# Patient Record
Sex: Female | Born: 1965
Health system: Southern US, Community
[De-identification: ages and names within clinical notes are randomized; demographics above are authoritative.]

## PROBLEM LIST (undated history)

## (undated) DIAGNOSIS — D649 Anemia, unspecified: Secondary | ICD-10-CM

## (undated) DIAGNOSIS — E785 Hyperlipidemia, unspecified: Secondary | ICD-10-CM

## (undated) DIAGNOSIS — I214 Non-ST elevation (NSTEMI) myocardial infarction: Secondary | ICD-10-CM

## (undated) DIAGNOSIS — G709 Myoneural disorder, unspecified: Secondary | ICD-10-CM

## (undated) DIAGNOSIS — K551 Chronic vascular disorders of intestine: Secondary | ICD-10-CM

## (undated) DIAGNOSIS — D869 Sarcoidosis, unspecified: Secondary | ICD-10-CM

## (undated) DIAGNOSIS — I10 Essential (primary) hypertension: Secondary | ICD-10-CM

## (undated) DIAGNOSIS — D219 Benign neoplasm of connective and other soft tissue, unspecified: Secondary | ICD-10-CM

## (undated) HISTORY — PX: ABDOMINAL HYSTERECTOMY: SHX81

## (undated) HISTORY — PX: FRACTURE SURGERY: SHX138

## (undated) HISTORY — DX: Anemia, unspecified: D64.9

---
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
## 1988-06-11 HISTORY — PX: TUBAL LIGATION: SHX77

## 1999-01-25 ENCOUNTER — Emergency Department (HOSPITAL_COMMUNITY): Admission: EM | Admit: 1999-01-25 | Discharge: 1999-01-25 | Payer: Self-pay | Admitting: Internal Medicine

## 1999-01-27 ENCOUNTER — Emergency Department (HOSPITAL_COMMUNITY): Admission: EM | Admit: 1999-01-27 | Discharge: 1999-01-27 | Payer: Self-pay | Admitting: Emergency Medicine

## 1999-04-20 ENCOUNTER — Other Ambulatory Visit: Admission: RE | Admit: 1999-04-20 | Discharge: 1999-04-20 | Payer: Self-pay | Admitting: Obstetrics and Gynecology

## 1999-07-12 ENCOUNTER — Encounter: Payer: Self-pay | Admitting: Emergency Medicine

## 1999-07-12 ENCOUNTER — Emergency Department (HOSPITAL_COMMUNITY): Admission: AD | Admit: 1999-07-12 | Discharge: 1999-07-12 | Payer: Self-pay | Admitting: Emergency Medicine

## 2001-03-03 ENCOUNTER — Emergency Department (HOSPITAL_COMMUNITY): Admission: EM | Admit: 2001-03-03 | Discharge: 2001-03-03 | Payer: Self-pay | Admitting: Emergency Medicine

## 2001-03-03 ENCOUNTER — Encounter: Payer: Self-pay | Admitting: Emergency Medicine

## 2002-05-23 ENCOUNTER — Emergency Department (HOSPITAL_COMMUNITY): Admission: EM | Admit: 2002-05-23 | Discharge: 2002-05-24 | Payer: Self-pay | Admitting: Emergency Medicine

## 2002-05-23 ENCOUNTER — Encounter: Payer: Self-pay | Admitting: Emergency Medicine

## 2002-09-24 ENCOUNTER — Other Ambulatory Visit: Admission: RE | Admit: 2002-09-24 | Discharge: 2002-09-24 | Payer: Self-pay | Admitting: Gynecology

## 2003-10-14 ENCOUNTER — Ambulatory Visit (HOSPITAL_COMMUNITY): Admission: RE | Admit: 2003-10-14 | Discharge: 2003-10-14 | Payer: Self-pay | Admitting: Gastroenterology

## 2003-11-09 ENCOUNTER — Other Ambulatory Visit: Admission: RE | Admit: 2003-11-09 | Discharge: 2003-11-09 | Payer: Self-pay | Admitting: Gynecology

## 2003-12-16 ENCOUNTER — Inpatient Hospital Stay (HOSPITAL_COMMUNITY): Admission: RE | Admit: 2003-12-16 | Discharge: 2003-12-18 | Payer: Self-pay | Admitting: Gynecology

## 2003-12-16 ENCOUNTER — Encounter (INDEPENDENT_AMBULATORY_CARE_PROVIDER_SITE_OTHER): Payer: Self-pay | Admitting: *Deleted

## 2003-12-26 ENCOUNTER — Inpatient Hospital Stay (HOSPITAL_COMMUNITY): Admission: AD | Admit: 2003-12-26 | Discharge: 2003-12-26 | Payer: Self-pay | Admitting: Gynecology

## 2005-04-27 ENCOUNTER — Emergency Department (HOSPITAL_COMMUNITY): Admission: EM | Admit: 2005-04-27 | Discharge: 2005-04-28 | Payer: Self-pay | Admitting: Emergency Medicine

## 2005-05-18 ENCOUNTER — Other Ambulatory Visit: Admission: RE | Admit: 2005-05-18 | Discharge: 2005-05-18 | Payer: Self-pay | Admitting: Obstetrics and Gynecology

## 2005-11-03 ENCOUNTER — Emergency Department (HOSPITAL_COMMUNITY): Admission: EM | Admit: 2005-11-03 | Discharge: 2005-11-03 | Payer: Self-pay | Admitting: Emergency Medicine

## 2006-03-28 ENCOUNTER — Encounter: Admission: RE | Admit: 2006-03-28 | Discharge: 2006-03-28 | Payer: Self-pay | Admitting: Gynecology

## 2006-10-31 ENCOUNTER — Encounter: Payer: Self-pay | Admitting: Gynecology

## 2006-10-31 ENCOUNTER — Ambulatory Visit (HOSPITAL_COMMUNITY): Admission: RE | Admit: 2006-10-31 | Discharge: 2006-10-31 | Payer: Self-pay | Admitting: Gynecology

## 2006-12-12 ENCOUNTER — Encounter: Admission: RE | Admit: 2006-12-12 | Discharge: 2006-12-12 | Payer: Self-pay | Admitting: Gynecology

## 2006-12-12 ENCOUNTER — Other Ambulatory Visit: Admission: RE | Admit: 2006-12-12 | Discharge: 2006-12-12 | Payer: Self-pay | Admitting: Gynecology

## 2007-06-26 ENCOUNTER — Emergency Department (HOSPITAL_COMMUNITY): Admission: EM | Admit: 2007-06-26 | Discharge: 2007-06-26 | Payer: Self-pay | Admitting: Emergency Medicine

## 2008-12-03 ENCOUNTER — Emergency Department (HOSPITAL_COMMUNITY): Admission: EM | Admit: 2008-12-03 | Discharge: 2008-12-03 | Payer: Self-pay | Admitting: Emergency Medicine

## 2008-12-05 ENCOUNTER — Emergency Department (HOSPITAL_COMMUNITY): Admission: EM | Admit: 2008-12-05 | Discharge: 2008-12-05 | Payer: Self-pay | Admitting: Emergency Medicine

## 2008-12-19 ENCOUNTER — Emergency Department (HOSPITAL_COMMUNITY): Admission: EM | Admit: 2008-12-19 | Discharge: 2008-12-19 | Payer: Self-pay | Admitting: Family Medicine

## 2009-01-08 ENCOUNTER — Emergency Department (HOSPITAL_COMMUNITY): Admission: EM | Admit: 2009-01-08 | Discharge: 2009-01-08 | Payer: Self-pay | Admitting: Emergency Medicine

## 2009-05-13 ENCOUNTER — Encounter: Admission: RE | Admit: 2009-05-13 | Discharge: 2009-05-13 | Payer: Self-pay | Admitting: Family Medicine

## 2009-07-04 ENCOUNTER — Ambulatory Visit (HOSPITAL_COMMUNITY): Admission: RE | Admit: 2009-07-04 | Discharge: 2009-07-04 | Payer: Self-pay | Admitting: Emergency Medicine

## 2009-11-22 ENCOUNTER — Ambulatory Visit (HOSPITAL_COMMUNITY): Admission: RE | Admit: 2009-11-22 | Discharge: 2009-11-22 | Payer: Self-pay | Admitting: Obstetrics & Gynecology

## 2010-07-02 ENCOUNTER — Encounter: Payer: Self-pay | Admitting: Family Medicine

## 2010-08-28 LAB — CBC
Hemoglobin: 14.2 g/dL (ref 12.0–15.0)
MCHC: 35.3 g/dL (ref 30.0–36.0)
RBC: 4.37 MIL/uL (ref 3.87–5.11)

## 2010-09-17 LAB — POCT URINALYSIS DIP (DEVICE)
Glucose, UA: NEGATIVE mg/dL
Hgb urine dipstick: NEGATIVE
Ketones, ur: NEGATIVE mg/dL
Nitrite: NEGATIVE
Specific Gravity, Urine: 1.01 (ref 1.005–1.030)
pH: 6.5 (ref 5.0–8.0)

## 2010-09-17 LAB — WET PREP, GENITAL: Yeast Wet Prep HPF POC: NONE SEEN

## 2010-09-17 LAB — GLUCOSE, CAPILLARY: Glucose-Capillary: 93 mg/dL (ref 70–99)

## 2010-10-24 NOTE — H&P (Signed)
NAME:  [name], [name]              ACCOUNT NO.:  [id number]   MEDICAL RECORD NO.:  [id number]          PATIENT TYPE:  AMB   LOCATION:  SDC                           FACILITY:  [facility]   PHYSICIAN:  Timothy P. Fontaine, M.D.DATE OF BIRTH:  04-20-1966   DATE OF ADMISSION:  DATE OF DISCHARGE:                              HISTORY & PHYSICAL   CHIEF COMPLAINT:  Right pelvic abdominal pain.   HISTORY OF PRESENT ILLNESS:  A 45 year old, G2, P2, female status post  hysterectomy with a 1 year history of recurrent right lower  abdominopelvic pain.  The patient has an acute onset and episode, was  evaluated in the office and was found to have right pelvic pain.  She  had no history to suggest infectious or appendicitis.  She had a  negative urine, a normal white count of 5.8, and a normal hemoglobin.  An ultrasound showed a cystic mass on the right ovary suggestive of a  corpus lutein measuring 23 mm.  The patient's pain has persisted since  onset, and she is admitted at this time for laparoscopic assessment and  right salpingo-oophorectomy.   PAST MEDICAL HISTORY:  Uncomplicated.   PAST SURGICAL HISTORY:  1. Total abdominal hysterectomy for leiomyomata.  2. Cesarean section.  3. Tubal sterilization.   ALLERGIES:  HYDROCODONE.   CURRENT MEDICATIONS:  Darvocet for pain p.r.n.Marland Kitchen   REVIEW OF SYSTEMS:  Noncontributory.   FAMILY HISTORY:  Noncontributory.   SOCIAL HISTORY:  Noncontributory.   ADMISSION PHYSICAL EXAMINATION:  VITAL SIGNS:  Afebrile, vital signs are  stable.  HEENT:  Normal.  LUNGS:  Clear.  CARDIAC:  Regular rate without rubs, murmurs, or gallops.  ABDOMEN:  Right lower quadrant tenderness to palpation.  No acute  changes such as rebound, guarding.  No masses or organomegaly.  PELVIC:  External, BUS, vagina normal.  Bimanual shows right-sided pain.  No palpable abnormalities.  Left side with masses or tenderness.  RECTOVAGINAL:  Normal.  Stool guaiac negative.   ASSESSMENT:  A 45 year old G2 P2 female, recurrent right lower quadrant  abdominopelvic pain over the past year, comes and goes suggestive of  ovarian origin.  Current episode disabling with evidence of physiologic  cystic changes on the right ovary.  Options for management were reviewed  to include expectant management, diagnostic laparoscopy up to and  including oophorectomy.  The patient, due to the chronicity of her  complaints wants to proceed with a right salpingo-oophorectomy  regardless of pathology encountered at the time of surgery, which I  thing given her total history is reasonable.  She does give me  permission to remove the left ovary if significant disease is  encountered, but she prefers to keep her left ovary.  She understands by  doing so she is at risk for left ovarian disease in the future such as  benign, cystic, or pain as well as ovarian cancer in the future, and she  understands and accepts this.  She understands that we will initiate  laparoscopic surgery.  She does have a history of prior abdominal  procedures, although at the time of her hysterectomy,  there was not  significant adhesions noted, but she does understand the increased risk  for injury with prior abdominal surgeries and will plan on a bowel prep  prophylactically.  She also understands that any time during the  surgery, I may convert from the laparoscopic approach to a laparotomy if  it is felt unsafe to proceed with the laparoscopic approach or if  complications arise, and she clearly understands this and accepts this.  The patient also understands there are no guarantees as far as pain  relief is concerned, that her pain may persist, worsen, or change  following the procedure and that it may not be ovarian in etiology,  although it certainly all points towards this.  She has no  gastrointestinal symptoms to accompany her pain nor does she have  urinary symptoms to suggest alternative etiologies.   I reviewed what is  involved with laparoscopic surgery to include multiple port sites,  trocar introduction, insufflation, sharp/blunt dissection, use of  electrocautery.  The risks of inadvertent injury to internal organs  including bowel, bladder, ureters, vessels, and nerves necessitating  major exploratory reparative surgery and future reparative surgeries  including bowel resection, ostomy formation, ureteral damage,  reimplantation all reviewed, understood, and accepted.  She understands  that this may be immediately recognized during this procedure or may be  delay recognized following the procedure.  The risks of infection, both  internal requiring prolonged antibiotics as well as incisional requiring  opening and draining of incisions was all discussed, understood, and  accepted.  The risks of hemorrhage necessitating transfusion and the  risks of transfusion including transfusion reaction, hepatitis, HIV, mad  cow disease, and other unknown entities was all discussed, understood,  and accepted.  The patient's questions were answered to her  satisfaction, and she is ready to proceed with surgery.      Timothy P. Fontaine, M.D.  Electronically Signed     TPF/MEDQ  D:  10/28/2006  T:  10/28/2006  Job:  [id number]

## 2010-10-24 NOTE — Op Note (Signed)
NAME:  [name], [name]              ACCOUNT NO.:  [id number]   MEDICAL RECORD NO.:  [id number]          PATIENT TYPE:  AMB   LOCATION:  SDC                           FACILITY:  [facility]   PHYSICIAN:  Timothy P. Fontaine, M.D.DATE OF BIRTH:  1966-03-19   DATE OF PROCEDURE:  10/31/2006  DATE OF DISCHARGE:                               OPERATIVE REPORT   PREOPERATIVE DIAGNOSIS:  Right abdominal pain.   POSTOPERATIVE DIAGNOSES:  1. Bilateral adnexal adhesions.  2. Bilateral small bowel to pelvic adhesions.  3. Perihepatic Fitz-Hugh-Curtis type adhesions   PROCEDURES:  1. Laparoscopic lysis of adhesions.  2. Right salpingo-oophorectomy.   SURGEON:  Timothy P. Fontaine, M.D.   ASSISTANTGaetano Hawthorne. Lily Peer, M.D.   ANESTHETIC:  General.   SPECIMEN:  Right ovary and right fallopian tube segment.   ESTIMATED BLOOD LOSS:  Minimal.   COMPLICATIONS:  None   FINDINGS:  EUA:  Bimanual exam without gross masses or abnormalities  noted.  Laparoscopic:  Small bowel loops adherent along both pelvic  sidewalls overlying adnexa sharply lysed and freed.  Left fallopian tube  and ovary grossly normal in appearance, adherent to the left sidewall.  Right fallopian tube and ovary grossly normal in appearance, firmly  adherent to the right pelvic sidewall.  No evidence of endometriosis or  other pelvic pathology.  Upper abdominal exam:  Appendix grossly normal,  free and mobile.  Liver adherent to the anterior chest wall and  perihepatic adhesions consistent with a Fitz-Hugh Curtis type syndrome.  Gallbladder grossly normal.  No evidence of upper abdominal pathology  other than the perihepatic adhesions.   PROCEDURE:  The patient was taken to the operating room, underwent  general anesthesia, placed in low dorsal lithotomy position, received an  abdominal perineal vaginal preparation with Betadine solution.  EUA  performed.  Foley catheter was placed in sterile technique and a sponge  stick placed  within the vagina for subsequent manipulation.  The patient  was draped in the usual fashion.  A transverse infraumbilical incision  was made.  The direct entry OptiVu type 10 mm laparoscope was then  placed under direct visualization without difficulty and the abdomen was  insufflated with carbon dioxide gas.  Right and left 5 mm suprapubic  ports were then placed under direct visualization after  transillumination for the vessels without difficulty.  Examination of  pelvic organs, upper abdominal exam was then carried out with findings  noted above.  Through sharp and blunt dissection, the small bowel loops  of intestine were freed from the bilateral adnexal regions to expose the  underlying adnexa.  The right adnexa was then elevated.  The right  ureter was identified and subsequently through progressive sharp and  blunt dissection, the ovary was progressively freed to allow access to  the right infundibulopelvic vessels.  Again the ureter was reinspected  and subsequently the infundibulopelvic pedicle was bipolar cauterized to  a flow of zero and incised.  Through sharp and blunt dissection and the  use of electrocautery, the ovary was freed from the sidewall.  There was  an area of ovarian  stroma and capsule that was left attached to the  peritoneum as the ureter passed underneath this area and it was feared  that attempted resection would jeopardize the ureter and this was left  in situ.  The stroma and capsule were superficially bipolar cauterized  to attempt to eradicate any residual ovarian active tissue.  The pelvis  was copiously irrigated.  Adequate hemostasis was visualized.  Subsequently the 5 mm laparoscope was placed through the suprapubic  port.  The Endopouch type bag was placed through the infraumbilical 10  mm port.  The ovary and tube placed within the bag and subsequently  removed through the infraumbilical port.  The pelvis was then  reinspected, adequate hemostasis  was visualized.  The ureter again was  identified on the right with normal-appearing peristalsis.  Interceed  was placed over the adnexal region.  The right and left 5 mm suprapubic  ports were removed.  The gas allowed to slowly escape and again under a  low pressure situation, the suprapubic ports and the pelvis showed  adequate hemostasis.  The infraumbilical port was then backed out under  direct visualization showing adequate hemostasis.  No evidence of hernia  formation.  A 0 Vicryl suture was placed infraumbilically in a running  fascial stitch to close the fascia.  All skin incisions injected using  0.25% Marcaine and the skin was reapproximated using 4-0 chromic in  interrupted cutaneous stitch.  Sterile dressings were applied.  The  Foley was removed.  The patient placed in the supine position, awakened  without difficulty, taken to the recovery room in good condition having  tolerated the procedure well.      Timothy P. Fontaine, M.D.  Electronically Signed     TPF/MEDQ  D:  10/31/2006  T:  10/31/2006  Job:  [id number]

## 2010-10-27 NOTE — H&P (Signed)
NAME:  [name]                          ACCOUNT NO.:  [id number]   MEDICAL RECORD NO.:  [id number]                   PATIENT TYPE:   LOCATION:                                       FACILITY:   PHYSICIAN:  Timothy P. Fontaine, M.D.           DATE OF BIRTH:   DATE OF ADMISSION:  DATE OF DISCHARGE:                                HISTORY & PHYSICAL   DATE OF ADMISSION:  December 16, 2003 for surgery.   CHIEF COMPLAINT:  Increasing dysmenorrhea, menorrhagia, pelvic pain.   HISTORY OF PRESENT ILLNESS:  A 45 year old G2 P2 female status post tubal  sterilization presents complaining of lower abdominal pain, menorrhagia,  dysmenorrhea.  She was initially evaluated through Urgent Care, had a CT  scan which documented a fibroid uterus, and was otherwise normal.  She was  referred to my office and on subsequent evaluation was indeed found to have  a fibroid uterus at approximately 12- to 14-week size.  The patient notes  over the past several years her periods have progressively gotten worse  where they are very heavy causing frequent tampon change and she is noted to  have an iron deficiency anemia with most recent hemoglobin at 9.9.  The  patient is admitted at this time for TAH.   PAST MEDICAL HISTORY:  Uncomplicated.   PAST SURGICAL HISTORY:  Includes C-section x1, vaginal delivery x1,  postpartum tubal sterilization.   CURRENT MEDICATIONS:  Iron, pain medication.   ALLERGIES:  None.   REVIEW OF SYSTEMS:  Noncontributory.   FAMILY HISTORY:  Noncontributory.   SOCIAL HISTORY:  Noncontributory.   ADMISSION PHYSICAL EXAMINATION:  VITAL SIGNS:  Afebrile; vital signs are  stable.  HEENT:  Normal.  LUNGS:  Clear.  CARDIAC:  Regular rate without rubs, murmurs, or gallops.  ABDOMEN:  Benign, noting palpable mass above the symphysis consistent with  leiomyomata.  PELVIC:  Externa, BUS, vagina normal.  Cervix grossly normal.  Uterus bulky,  12- to 14-week size.  Adnexa without  gross masses or tenderness.   ASSESSMENT AND PLAN:  A 45 year old gravida 2 para 2 female status post  tubal sterilization, increasing dysmenorrhea menorrhagia, lower pelvic pain,  dyspareunia, found to have leiomyomata on exam, confirmed through both CT as  well as pelvic ultrasound.  Right and left ovaries were visualized and were  normal.  Options for management were reviewed with the patient to include  conservative observation, myomectomy, arterial embolization, as well as  hysterectomy, and the patient wants to proceed with hysterectomy.  Given  prior cesarean section and bulk of the uterus, we feel an abdominal approach  is the most prudent and will plan on a total abdominal hysterectomy.  Ovarian conservation issue was discussed with the patient and the options of  removing both ovaries or keeping both ovaries were discussed with her and  she prefers to keep both ovaries for continued hormone production.  She  understands by keeping  her ovaries she is at risk for problems with her  ovaries in the future to include pain as well as disease requiring  reoperation as well as the risk of ovarian cancer.  I also reviewed with her  that if at the time of surgery one or both ovaries are severely affected  and/or for technical reasons will need to be removed with the uterus that  she does give me permission to remove one or both ovaries at the time of  surgery and would consider hormone replacement following surgery.  Absolute  sterility associated with hysterectomy was also discussed, understood, and  accepted.  Sexuality following hysterectomy was reviewed and the risk for  orgasmic dysfunction following the procedure as well as persistent  dyspareunia was discussed, understood, and accepted.  The expected  intraoperative/postoperative courses were reviewed with her and the risk of  infection - both internal requiring prolonged antibiotics as well as abscess  formation requiring  reoperation and drainage of abscesses was discussed with  her.  The risk of wound complications requiring opening and draining of  incisions, closure by secondary intention was all reviewed, understood, and  accepted.  The risk of hemorrhage necessitating transfusion and the risks of  transfusion were reviewed to include transfusion reaction, hepatitis, HIV,  mad cow disease, and other unknown entities.  She does have a low  hemoglobin.  She has been on extra iron and accepts the realistic risk of  transfusion.  The risk of inadvertant injury to internal organs including  bowel, bladder, ureters, vessels, and nerves necessitating major exploratory  reparative surgeries and future reparative surgeries including ostomy  formation was all discussed, understood, and accepted.  The patient's  questions are answered to her satisfaction and she is ready to proceed with  surgery.                                               Timothy P. Audie Box, M.D.    TPF/MEDQ  D:  12/03/2003  T:  12/04/2003  Job:  [id number]

## 2010-10-27 NOTE — Discharge Summary (Signed)
NAME:  [name], [name]                        ACCOUNT NO.:  [id number]   MEDICAL RECORD NO.:  [id number]                   PATIENT TYPE:  INP   LOCATION:  9312                                 FACILITY:  [facility]   PHYSICIAN:  Timothy P. Fontaine, M.D.           DATE OF BIRTH:  August 20, 1965   DATE OF ADMISSION:  12/16/2003  DATE OF DISCHARGE:  12/18/2003                                 DISCHARGE SUMMARY   DISCHARGE DIAGNOSES:  1. Leiomyomata.  2. Menorrhagia.  3. Dysmenorrhea.  4. Adenomyosis.   PROCEDURE:  Total abdominal hysterectomy, December 16, 2003.   PATHOLOGY:  [id number], chronic cervicitis, squamous metaplasia, early  secretory endometrium, multiple leiomyomata, adenomyosis.   HOSPITAL COURSE:  The patient was admitted for the above surgery which was  performed uncomplicated on December 16, 2003. The patient's postoperative course  was uncomplicated and she was discharged on postoperative day #2, ambulating  well, tolerating a regular diet with a postop hemoglobin of 8.5.  The  patient received precaution instructions in followup and will be seen in the  office in two weeks and received a prescription for Demerol 50 mg #10, 1-2  p.o. q.4-6 h. p.r.n. pain.                                               Timothy P. Audie Box, M.D.    TPF/MEDQ  D:  01/13/2004  T:  01/14/2004  Job:  [id number]

## 2010-10-27 NOTE — Op Note (Signed)
NAME:  [name], [name]                        ACCOUNT NO.:  [id number]   MEDICAL RECORD NO.:  [id number]                   PATIENT TYPE:  INP   LOCATION:  9399                                 FACILITY:  [facility]   PHYSICIAN:  Timothy P. Fontaine, M.D.           DATE OF BIRTH:  [birth year]   DATE OF PROCEDURE:  12/16/2003  DATE OF DISCHARGE:                                 OPERATIVE REPORT   PREOPERATIVE DIAGNOSIS:  Leiomyomata.   POSTOPERATIVE DIAGNOSIS:  Leiomyomata.   PROCEDURE:  Total abdominal hysterectomy.   SURGEON:  Timothy P. Fontaine, M.D.   ASSISTANT:  Rande Brunt. Eda Paschal, M.D.   ANESTHESIA:  General endotracheal anesthesia.   ESTIMATED BLOOD LOSS:  200 mL.   COMPLICATIONS:  None.   SPECIMENS:  Uterus.   FINDINGS:  Large lower uterine segment leiomyomata.  Pelvis otherwise  unremarkable.  Right and left fallopian tube segments grossly normal.  Evidence of prior tubal sterilization.  Right and left ovaries grossly  normal, free and mobile.   PROCEDURE:  The patient was taken to the operating room, placed in the  supine position and underwent general endotracheal anesthesia without  difficulty.  She received an abdominal, vaginal and perineal preparation  with Betadine solution per nursing personnel and a Foley catheter was placed  in sterile technique.  The patient was draped in usual fashion.  The abdomen  was sharply entered through a repeat Pfannenstiel incision achieving  adequate hemostasis at all levels.  A Balfour and bladder blade were placed  within the incision and the intestines were packed from the operative field.  Uterus was elevated from the pelvis.  The right round ligament was  transected with electrocautery.  The anterior vesicouterine peritoneal fold  was sharply dissected and the uterine ovarian pedicle was bluntly and  sharply developed.  The utero-ovarian pedicle was then doubly clamped, cut  and doubly ligated using 0 Vicryl suture and a suture  ligature followed by  simple stitch.  Similar procedure was carried out on the other side, noting  meeting in the middle of the anterior vesicouterine peritoneal fold  incision.  The right uterine vascular pedicle was sharply developed and  subsequently was doubly clamped, cut and doubly ligated using 0 Vicryl  suture.  A similar procedure was carried out on the other side noting the  leiomyomata was extending into the left broad ligament and the vessels were  attenuated over the leiomyomata.  The bladder flap was sharply and bluntly  developed without difficulty and subsequently the uterus was freed from its  attachment through progressive clamping, cutting and ligating of the  parametrial tissues and cardinal ligaments using 0 Vicryl suture.  The right  vaginal angle was then crossclamped with a right angle clamp and the vagina  was sharply entered and the specimen was subsequently circumferentially  excised from the patient.  Right and left angle sutures were placed using 0  Vicryl suture and were  tagged for further reference. The vaginal mucosa was  then run using 0 Vicryl suture in a running interlocking stitch.  The vagina  was then reapproximated with a single interrupted 0 Vicryl suture anterior  to posterior.  The pelvis was copiously irrigated.  All pedicles were  inspected showing adequate hemostasis.  The bowel packing was removed.  Balfour retractor and bladder blade removed and the anterior fascia was  reapproximated using 0 Vicryl suture in a running stitch starting at the  angle and meeting in the middle.  The subcutaneous tissues were irrigated.  Adequate hemostasis achieved with electrocautery.  The skin was  reapproximated using 4-0 Vicryl in a running subcuticular stitch.  Steri-  Strips and Benzoin applied.  Sterile dressing applied.  The patient was  awakened without difficulty and taken to the recovery room in good condition  having tolerated the procedure well with  free-flowing clear, yellow urine.                                               Timothy P. Audie Box, M.D.    TPF/MEDQ  D:  12/16/2003  T:  12/16/2003  Job:  [id number]

## 2011-03-02 LAB — DIFFERENTIAL
Basophils Absolute: 0
Eosinophils Relative: 4
Lymphocytes Relative: 34
Monocytes Relative: 6
Neutro Abs: 3.9
Neutrophils Relative %: 56

## 2011-03-02 LAB — I-STAT 8, (EC8 V) (CONVERTED LAB)
BUN: <3 — ABNORMAL LOW
Bicarbonate: 20.3
Bicarbonate: 20.9
Chloride: 106
Hemoglobin: 15.3 — ABNORMAL HIGH
Operator id: 133351
Potassium: 3.4 — ABNORMAL LOW
Potassium: 3.4 — ABNORMAL LOW
Sodium: 139
TCO2: 21
pH, Ven: 7.329 — ABNORMAL HIGH

## 2011-03-02 LAB — URINALYSIS, ROUTINE W REFLEX MICROSCOPIC
Ketones, ur: NEGATIVE
Specific Gravity, Urine: 1.007
Urobilinogen, UA: 1
pH: 6.5

## 2011-03-02 LAB — POCT I-STAT CREATININE
Creatinine, Ser: 0.7
Creatinine, Ser: 0.7

## 2011-03-02 LAB — PREGNANCY, URINE: Preg Test, Ur: NEGATIVE

## 2011-03-02 LAB — CBC
Hemoglobin: 14.2
MCHC: 33.8
MCV: 89.9
RBC: 4.68
RDW: 12.8

## 2012-06-20 ENCOUNTER — Emergency Department (HOSPITAL_COMMUNITY)
Admission: EM | Admit: 2012-06-20 | Discharge: 2012-06-20 | Disposition: A | Discharge status: Home / Self Care | Payer: BC Managed Care – PPO | Attending: Emergency Medicine | Admitting: Emergency Medicine

## 2012-06-20 ENCOUNTER — Encounter (HOSPITAL_COMMUNITY): Payer: Self-pay | Admitting: Emergency Medicine

## 2012-06-20 DIAGNOSIS — F17200 Nicotine dependence, unspecified, uncomplicated: Secondary | ICD-10-CM | POA: Insufficient documentation

## 2012-06-20 DIAGNOSIS — N390 Urinary tract infection, site not specified: Secondary | ICD-10-CM

## 2012-06-20 DIAGNOSIS — N760 Acute vaginitis: Secondary | ICD-10-CM | POA: Insufficient documentation

## 2012-06-20 DIAGNOSIS — Z3202 Encounter for pregnancy test, result negative: Secondary | ICD-10-CM | POA: Insufficient documentation

## 2012-06-20 DIAGNOSIS — R3911 Hesitancy of micturition: Secondary | ICD-10-CM | POA: Insufficient documentation

## 2012-06-20 DIAGNOSIS — R300 Dysuria: Secondary | ICD-10-CM | POA: Insufficient documentation

## 2012-06-20 DIAGNOSIS — Z79899 Other long term (current) drug therapy: Secondary | ICD-10-CM | POA: Insufficient documentation

## 2012-06-20 DIAGNOSIS — Z90710 Acquired absence of both cervix and uterus: Secondary | ICD-10-CM | POA: Insufficient documentation

## 2012-06-20 LAB — WET PREP, GENITAL: Yeast Wet Prep HPF POC: NONE SEEN

## 2012-06-20 LAB — URINALYSIS, ROUTINE W REFLEX MICROSCOPIC
Bilirubin Urine: NEGATIVE
Glucose, UA: NEGATIVE mg/dL
Hgb urine dipstick: NEGATIVE
Ketones, ur: NEGATIVE mg/dL
Nitrite: POSITIVE — AB
Protein, ur: NEGATIVE mg/dL
Urobilinogen, UA: 1 mg/dL (ref 0.0–1.0)
pH: 6.5 (ref 5.0–8.0)

## 2012-06-20 LAB — URINE MICROSCOPIC-ADD ON

## 2012-06-20 LAB — POCT PREGNANCY, URINE: Preg Test, Ur: NEGATIVE

## 2012-06-20 MED ORDER — METRONIDAZOLE 500 MG PO TABS
2000.0000 mg | ORAL_TABLET | Freq: Once | ORAL | Status: AC
  Administered 2012-06-20: 2000 mg via ORAL
  Filled 2012-06-20: qty 4

## 2012-06-20 MED ORDER — HYDROCODONE-ACETAMINOPHEN 5-325 MG PO TABS: 1.0000 | ORAL_TABLET | ORAL | Status: DC | PRN

## 2012-06-20 MED ORDER — LIDOCAINE HCL 1 % IJ SOLN
INTRAMUSCULAR | Status: AC
  Administered 2012-06-20: 0.9 mL
  Filled 2012-06-20: qty 20

## 2012-06-20 MED ORDER — ONDANSETRON 4 MG PO TBDP
4.0000 mg | ORAL_TABLET | Freq: Once | ORAL | Status: AC
  Administered 2012-06-20: 4 mg via ORAL
  Filled 2012-06-20: qty 1

## 2012-06-20 MED ORDER — CEFTRIAXONE SODIUM 250 MG IJ SOLR
250.0000 mg | Freq: Once | INTRAMUSCULAR | Status: AC
Start: 2012-06-20 — End: 2012-06-20
  Administered 2012-06-20: 250 mg via INTRAMUSCULAR
  Filled 2012-06-20: qty 250

## 2012-06-20 MED ORDER — NITROFURANTOIN MONOHYD MACRO 100 MG PO CAPS: 100.0000 mg | ORAL_CAPSULE | Freq: Two times a day (BID) | ORAL | Status: DC

## 2012-06-20 MED ORDER — AZITHROMYCIN 250 MG PO TABS
1000.0000 mg | ORAL_TABLET | Freq: Once | ORAL | Status: AC
  Administered 2012-06-20: 1000 mg via ORAL
  Filled 2012-06-20: qty 4

## 2012-06-20 NOTE — ED Notes (Signed)
Pt states she has been drinking a lot of sodas and she states she has urinary frequency, painful urination, and abd pain  Pt states she has been drinking water all day but everytime she tries to urinate only a little comes out

## 2012-06-20 NOTE — ED Notes (Signed)
Pelvic setup at bedside.

## 2012-06-20 NOTE — ED Notes (Signed)
Pt reports unprotected sex with sexual partner started 6 months ago.

## 2012-06-20 NOTE — ED Provider Notes (Signed)
Medical screening examination/treatment/procedure(s) were performed by non-physician practitioner and as supervising physician I was immediately available for consultation/collaboration.  Taffie Eckmann, MD 06/20/12 0551 

## 2012-06-20 NOTE — ED Provider Notes (Signed)
History     CSN: [id number]  Arrival date & time 06/20/12  0234   First MD Initiated Contact with Patient 06/20/12 0249      Chief Complaint  Patient presents with  . Dysuria  . Abdominal Pain    (Consider location/radiation/quality/duration/timing/severity/associated sxs/prior treatment) HPI  Pt presents to the ER for suprapubic pain, urinary hesitancy, dysuria and would like to "get a full work up  and get checked for all the std's and everything like that". She says that she drinks a lot of carbonated  beverages and today drank a lot of water but wasn't able to urinate the same amount as normal. She  denies vomiting, diarrhea, flank pain, RLQ pain, fevers, chills, nausea, weakness. nad vss   History reviewed. No pertinent past medical history.  Past Surgical History  Procedure Date  . Cesarean section   . Abdominal hysterectomy     Family History  Problem Relation Age of Onset  . Hypertension Other   . Diabetes Other     History  Substance Use Topics  . Smoking status: Current Every Day Smoker  . Smokeless tobacco: Not on file  . Alcohol Use: No    OB History    Grav Para Term Preterm Abortions TAB SAB Ect Mult Living                  Review of Systems  Review of Systems  Gen: no weight loss, fevers, chills, night sweats  Eyes: no discharge or drainage, no occular pain or visual changes  Nose: no epistaxis or rhinorrhea  Mouth: no dental pain, no sore throat  Neck: no neck pain  Lungs:No wheezing, coughing or hemoptysis CV: no chest pain, palpitations, dependent edema or orthopnea  Abd: suprapubic pain, nausea, vomiting  GU: + dysuria and urinary hesitency NO gross hematuria  MSK:  No abnormalities  Neuro: no headache, no focal neurologic deficits  Skin: no abnormalities Psyche: negative.   Allergies  Hydrocodone  Home Medications   Current Outpatient Rx  Name  Route  Sig  Dispense  Refill  . CHLORHEXIDINE GLUCONATE 0.12 % MT SOLN  Mouth/Throat   Use as directed 15 mLs in the mouth or throat 2 (two) times daily. For teeth extraction         . NAPROXEN SODIUM 220 MG PO TABS   Oral   Take 220 mg by mouth 2 (two) times daily with a meal.         . HYDROCODONE-ACETAMINOPHEN 5-325 MG PO TABS   Oral   Take 1 tablet by mouth every 4 (four) hours as needed for pain.   6 tablet   0   . NITROFURANTOIN MONOHYD MACRO 100 MG PO CAPS   Oral   Take 1 capsule (100 mg total) by mouth 2 (two) times daily.   10 capsule   0   . PRESCRIPTION MEDICATION      Unknown Antibiotic for dental surgery           BP 168/96  Pulse 86  Resp 18  SpO2 100%  Physical Exam  Nursing note and vitals reviewed. Constitutional: She appears well-developed and well-nourished. No distress.  HENT:  Head: Normocephalic and atraumatic.  Eyes: Pupils are equal, round, and reactive to light.  Neck: Normal range of motion. Neck supple.  Cardiovascular: Normal rate and regular rhythm.   Pulmonary/Chest: Effort normal.  Abdominal: Soft. There is tenderness in the suprapubic area.  Genitourinary: Uterus normal. Cervix exhibits discharge. Cervix exhibits  no motion tenderness and no friability. Vaginal discharge found.  Neurological: She is alert.  Skin: Skin is warm and dry.    ED Course  Procedures (including critical care time)  Labs Reviewed  URINALYSIS, ROUTINE W REFLEX MICROSCOPIC - Abnormal; Notable for the following:    APPearance CLOUDY (*)     Nitrite POSITIVE (*)     Leukocytes, UA MODERATE (*)     All other components within normal limits  WET PREP, GENITAL - Abnormal; Notable for the following:    Clue Cells Wet Prep HPF POC MODERATE (*)     WBC, Wet Prep HPF POC RARE (*)     All other components within normal limits  URINE MICROSCOPIC-ADD ON - Abnormal; Notable for the following:    Bacteria, UA MANY (*)     All other components within normal limits  POCT PREGNANCY, URINE  GC/CHLAMYDIA PROBE AMP  URINE CULTURE    No results found.   1. UTI (lower urinary tract infection)   2. Bacterial vaginosis       MDM  Urinalysis is positive for leukocytes, nitrites, WBC and many bacteria.   Pt has no symptoms of Pyelo. No fevers, nausea, vomiting or abdominal pains. She does have some mild suprapubic bone discomfort. Labs show BV and UTI. She has concern for STD therefore I will go ahead and treat that here as well. rx Macrobid for UTI, Urine culture sent out  Pt has been advised of the symptoms that warrant their return to the ED. Patient has voiced understanding and has agreed to follow-up with the PCP or specialist.      Dorthula Matas, PA 06/20/12 [phone]

## 2012-06-20 NOTE — ED Notes (Addendum)
Pt reports having one sexual partner that started two years ago. Pt reports never having any problems until now. Signs and symptoms is lower abdominal sharp pain that started two days prior. Denies n/v/d. Pt reports "pulling" and burning sensation when she urinates. Denies painful intercourse.

## 2012-06-21 LAB — GC/CHLAMYDIA PROBE AMP: GC Probe RNA: NEGATIVE

## 2012-06-22 LAB — URINE CULTURE: Colony Count: >=100000

## 2012-06-23 NOTE — ED Notes (Signed)
+  Urine. Patient treated with Macrobid. Sensitive to same. Per protocol MD. °

## 2013-01-04 ENCOUNTER — Emergency Department (HOSPITAL_COMMUNITY)
Admission: EM | Admit: 2013-01-04 | Discharge: 2013-01-04 | Disposition: A | Discharge status: Home / Self Care | Payer: BC Managed Care – PPO | Attending: Emergency Medicine | Admitting: Emergency Medicine

## 2013-01-04 ENCOUNTER — Encounter (HOSPITAL_COMMUNITY): Payer: Self-pay | Admitting: Emergency Medicine

## 2013-01-04 ENCOUNTER — Emergency Department (HOSPITAL_COMMUNITY): Payer: BC Managed Care – PPO

## 2013-01-04 DIAGNOSIS — F17200 Nicotine dependence, unspecified, uncomplicated: Secondary | ICD-10-CM | POA: Insufficient documentation

## 2013-01-04 DIAGNOSIS — IMO0002 Reserved for concepts with insufficient information to code with codable children: Secondary | ICD-10-CM | POA: Insufficient documentation

## 2013-01-04 DIAGNOSIS — S29011A Strain of muscle and tendon of front wall of thorax, initial encounter: Secondary | ICD-10-CM

## 2013-01-04 DIAGNOSIS — Y990 Civilian activity done for income or pay: Secondary | ICD-10-CM | POA: Insufficient documentation

## 2013-01-04 DIAGNOSIS — X500XXA Overexertion from strenuous movement or load, initial encounter: Secondary | ICD-10-CM | POA: Insufficient documentation

## 2013-01-04 DIAGNOSIS — Y929 Unspecified place or not applicable: Secondary | ICD-10-CM | POA: Insufficient documentation

## 2013-01-04 DIAGNOSIS — Z79899 Other long term (current) drug therapy: Secondary | ICD-10-CM | POA: Insufficient documentation

## 2013-01-04 DIAGNOSIS — Y9389 Activity, other specified: Secondary | ICD-10-CM | POA: Insufficient documentation

## 2013-01-04 LAB — BASIC METABOLIC PANEL
Calcium: 8.9 mg/dL (ref 8.4–10.5)
GFR calc Af Amer: >90 mL/min (ref >90)
GFR calc non Af Amer: >90 mL/min (ref >90)
Sodium: 134 mEq/L — ABNORMAL LOW (ref 135–145)

## 2013-01-04 LAB — POCT I-STAT TROPONIN I: Troponin i, poc: 0 ng/mL (ref 0.00–0.08)

## 2013-01-04 LAB — CBC
MCH: 31.4 pg (ref 26.0–34.0)
MCHC: 36.1 g/dL — ABNORMAL HIGH (ref 30.0–36.0)
Platelets: 301 10*3/uL (ref 150–400)
RBC: 4.08 MIL/uL (ref 3.87–5.11)
RDW: 12.6 % (ref 11.5–15.5)

## 2013-01-04 LAB — PRO B NATRIURETIC PEPTIDE: Pro B Natriuretic peptide (BNP): 109.8 pg/mL (ref 0–125)

## 2013-01-04 MED ORDER — DIAZEPAM 5 MG PO TABS
5.0000 mg | ORAL_TABLET | Freq: Once | ORAL | Status: AC
  Administered 2013-01-04: 5 mg via ORAL
  Filled 2013-01-04: qty 1

## 2013-01-04 MED ORDER — TRAMADOL HCL 50 MG PO TABS
50.0000 mg | ORAL_TABLET | Freq: Once | ORAL | Status: AC
  Administered 2013-01-04: 50 mg via ORAL
  Filled 2013-01-04: qty 1

## 2013-01-04 MED ORDER — IBUPROFEN 200 MG PO TABS: 400.0000 mg | ORAL_TABLET | Freq: Four times a day (QID) | ORAL | Status: DC | PRN

## 2013-01-04 MED ORDER — DIAZEPAM 5 MG PO TABS: 5.0000 mg | ORAL_TABLET | Freq: Three times a day (TID) | ORAL | Status: DC | PRN

## 2013-01-04 MED ORDER — TRAMADOL HCL 50 MG PO TABS: 50.0000 mg | ORAL_TABLET | Freq: Four times a day (QID) | ORAL | Status: DC | PRN

## 2013-01-04 NOTE — ED Notes (Signed)
PT. REPORTS RIGHT CHEST PAIN UNDER BREAST/RIBCAGE WORSE WITH DEEP INSPIRATION ONSET 4 DAYS AGO . DENIES INJURY , SLIGHT SOB , DENIES NAUSEA OR DIAPHORESIS.
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
## 2013-04-01 ENCOUNTER — Emergency Department (HOSPITAL_COMMUNITY)
Admission: EM | Admit: 2013-04-01 | Discharge: 2013-04-01 | Disposition: A | Discharge status: Home / Self Care | Payer: BC Managed Care – PPO

## 2013-04-01 ENCOUNTER — Encounter (HOSPITAL_COMMUNITY): Payer: Self-pay | Admitting: Emergency Medicine

## 2013-04-01 ENCOUNTER — Emergency Department (HOSPITAL_COMMUNITY)
Admission: EM | Admit: 2013-04-01 | Discharge: 2013-04-01 | Disposition: A | Discharge status: Home / Self Care | Payer: BC Managed Care – PPO | Attending: Emergency Medicine | Admitting: Emergency Medicine

## 2013-04-01 DIAGNOSIS — L42 Pityriasis rosea: Secondary | ICD-10-CM | POA: Insufficient documentation

## 2013-04-01 DIAGNOSIS — Z79899 Other long term (current) drug therapy: Secondary | ICD-10-CM | POA: Insufficient documentation

## 2013-04-01 DIAGNOSIS — F17200 Nicotine dependence, unspecified, uncomplicated: Secondary | ICD-10-CM | POA: Insufficient documentation

## 2013-04-01 MED ORDER — KETOCONAZOLE 1 % EX SHAM: 1.0000 "application " | MEDICATED_SHAMPOO | Freq: Every day | CUTANEOUS | Status: DC

## 2013-04-01 MED ORDER — DIPHENHYDRAMINE HCL 25 MG PO CAPS
50.0000 mg | ORAL_CAPSULE | Freq: Once | ORAL | Status: AC
  Administered 2013-04-01: 50 mg via ORAL
  Filled 2013-04-01: qty 2

## 2013-04-01 NOTE — ED Notes (Signed)
Pt reports she went to the beach and since staying in the hotel she has had a rash to her back. Pt reports using Gold Bond itch cream and Benadryl but the rash has spread over her back and to her chest.  Pt reports itching to her back.

## 2013-04-01 NOTE — ED Provider Notes (Signed)
Medical screening examination/treatment/procedure(s) were performed by non-physician practitioner and as supervising physician I was immediately available for consultation/collaboration.    Sunnie Nielsen, MD 04/01/13 [phone]

## 2013-04-01 NOTE — ED Provider Notes (Signed)
CSN: [id number]     Arrival date & time 04/01/13  0027 History   First MD Initiated Contact with Patient 04/01/13 0053     Chief Complaint  Patient presents with  . Rash   HPI  History provided by the patient and significant other. Patient is a 47 year old female with no significant PMH who presents with concerns for persistent pruritic rash to her back and chest. Rash first began several weeks ago showed after returning home from to the beach. Patient has been using Benadryl which does help with the itching symptoms. She has also been using body lotions to help with some dryness of the skin caused by the rash. She also used Gold Bond itch cream with only slight improvements. She reports rash is persistent and has not gone away or improve. She did stay in a hotel on her trip no one else who stayed with her had any similar symptoms. She denies any associated fever, chills or sweats. No respiratory difficulties, swelling of the mouth, lips or tongue. She denies having similar rash previously. No unusual foods, soaps, lotions, detergents or shampoos.   History reviewed. No pertinent past medical history. Past Surgical History  Procedure Laterality Date  . Cesarean section    . Abdominal hysterectomy     Family History  Problem Relation Age of Onset  . Hypertension Other   . Diabetes Other    History  Substance Use Topics  . Smoking status: Current Every Day Smoker  . Smokeless tobacco: Not on file  . Alcohol Use: No   OB History   Grav Para Term Preterm Abortions TAB SAB Ect Mult Living                 Review of Systems  Constitutional: Negative for fever, chills and diaphoresis.  Respiratory: Negative for shortness of breath.   Skin: Positive for rash.  All other systems reviewed and are negative.    Allergies  Hydrocodone  Home Medications   Current Outpatient Rx  Name  Route  Sig  Dispense  Refill  . ibuprofen (ADVIL,MOTRIN) 200 MG tablet   Oral   Take 2 tablets  (400 mg total) by mouth every 6 (six) hours as needed for pain.   30 tablet   0   . KETOCONAZOLE, TOPICAL, 1 % SHAM   Apply externally   Apply 1 application topically daily.   200 mL   0    BP 154/92  Pulse 99  Temp(Src) 98 F (36.7 C) (Oral)  Resp 18  Ht 5\' 4"  (1.626 m)  Wt 137 lb 9.6 oz (62.415 kg)  BMI 23.61 kg/m2  SpO2 99% Physical Exam  Nursing note and vitals reviewed. Constitutional: She is oriented to person, place, and time. She appears well-developed and well-nourished. No distress.  HENT:  Head: Normocephalic.  Mouth/Throat: Oropharynx is clear and moist.  Eyes: Conjunctivae are normal.  Neck: Normal range of motion. Neck supple.  Cardiovascular: Normal rate and regular rhythm.   Pulmonary/Chest: Effort normal and breath sounds normal. No stridor. No respiratory distress. She has no wheezes. She has no rales.  Abdominal: Soft.  Musculoskeletal: Normal range of motion. She exhibits no edema and no tenderness.  Neurological: She is alert and oriented to person, place, and time.  Skin: Skin is warm and dry. Rash noted.  Hyperpigmented macular rash with dryness diffusely over the back. Lesions generally range between 7-15 cm. No erythema. No indurations. No vesicles or papules  Psychiatric: She has a normal  mood and affect. Her behavior is normal.    ED Course  Procedures    MDM   1. Pityriasis rosea    Patient appears well no acute distress. She does not appear toxic. Rash appears consistent with possible pityriasis rosea. Rash is diffuse. Does not appear urticarial. At this time we'll recommend continued treatment with right lotions for dryness of the skin as well as Benadryl for itching. Dermatology referral provided.    Angus Seller, PA-C 04/01/13 [phone]

## 2013-06-03 ENCOUNTER — Emergency Department (HOSPITAL_COMMUNITY): Payer: BC Managed Care – PPO

## 2013-06-03 ENCOUNTER — Encounter (HOSPITAL_COMMUNITY): Payer: Self-pay | Admitting: Emergency Medicine

## 2013-06-03 ENCOUNTER — Emergency Department (HOSPITAL_COMMUNITY)
Admission: EM | Admit: 2013-06-03 | Discharge: 2013-06-03 | Disposition: A | Discharge status: Home / Self Care | Payer: BC Managed Care – PPO | Attending: Emergency Medicine | Admitting: Emergency Medicine

## 2013-06-03 DIAGNOSIS — Z79899 Other long term (current) drug therapy: Secondary | ICD-10-CM | POA: Insufficient documentation

## 2013-06-03 DIAGNOSIS — R509 Fever, unspecified: Secondary | ICD-10-CM | POA: Insufficient documentation

## 2013-06-03 DIAGNOSIS — F17200 Nicotine dependence, unspecified, uncomplicated: Secondary | ICD-10-CM | POA: Insufficient documentation

## 2013-06-03 DIAGNOSIS — B9789 Other viral agents as the cause of diseases classified elsewhere: Secondary | ICD-10-CM | POA: Insufficient documentation

## 2013-06-03 DIAGNOSIS — R5381 Other malaise: Secondary | ICD-10-CM | POA: Insufficient documentation

## 2013-06-03 DIAGNOSIS — IMO0001 Reserved for inherently not codable concepts without codable children: Secondary | ICD-10-CM | POA: Insufficient documentation

## 2013-06-03 DIAGNOSIS — R110 Nausea: Secondary | ICD-10-CM | POA: Insufficient documentation

## 2013-06-03 DIAGNOSIS — R51 Headache: Secondary | ICD-10-CM | POA: Insufficient documentation

## 2013-06-03 DIAGNOSIS — B349 Viral infection, unspecified: Secondary | ICD-10-CM

## 2013-06-03 DIAGNOSIS — R52 Pain, unspecified: Secondary | ICD-10-CM | POA: Insufficient documentation

## 2013-06-03 MED ORDER — IBUPROFEN 800 MG PO TABS
800.0000 mg | ORAL_TABLET | Freq: Once | ORAL | Status: AC
  Administered 2013-06-03: 800 mg via ORAL
  Filled 2013-06-03: qty 1

## 2013-06-03 MED ORDER — SODIUM CHLORIDE 0.9 % IV BOLUS (SEPSIS)
1000.0000 mL | Freq: Once | INTRAVENOUS | Status: AC
  Administered 2013-06-03: 1000 mL via INTRAVENOUS

## 2013-06-03 NOTE — ED Notes (Signed)
Pt reports headache,non productive cough, body aches, chills and feeling lightheaded x 2 days. Denies any n/v/d. Mask on pt at triage.

## 2013-06-03 NOTE — ED Provider Notes (Signed)
CSN: [id number]     Arrival date & time 06/03/13  1512 History   First MD Initiated Contact with Patient 06/03/13 1630     Chief Complaint  Patient presents with  . Influenza   (Consider location/radiation/quality/duration/timing/severity/associated sxs/prior Treatment) HPI Comments: Patient presents to the ED with a chief complaint of cough, headache, fever, and generalized body aches x 3 days.  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  She denies getting a flu shot this year.  She denies any associated n/v/d.  She states that she has tried taking mucinex and OTC cough and cold medicine with some relief.  Patient is a 47 y.o. female presenting with flu symptoms. The history is provided by the patient. No language interpreter was used.  Influenza Presenting symptoms: cough, fatigue, fever, headache, myalgias and nausea   Presenting symptoms: no diarrhea, no rhinorrhea, no shortness of breath, no sore throat and no vomiting     History reviewed. No pertinent past medical history. Past Surgical History  Procedure Laterality Date  . Cesarean section    . Abdominal hysterectomy     Family History  Problem Relation Age of Onset  . Hypertension Other   . Diabetes Other    History  Substance Use Topics  . Smoking status: Current Every Day Smoker  . Smokeless tobacco: Not on file  . Alcohol Use: No   OB History   Grav Para Term Preterm Abortions TAB SAB Ect Mult Living                 Review of Systems  Constitutional: Positive for fever and fatigue.  HENT: Negative for rhinorrhea and sore throat.   Respiratory: Positive for cough. Negative for shortness of breath.   Gastrointestinal: Positive for nausea. Negative for vomiting and diarrhea.  Musculoskeletal: Positive for myalgias.  Neurological: Positive for headaches.  All other systems reviewed and are negative.    Allergies  Hydrocodone  Home Medications   Current  Outpatient Rx  Name  Route  Sig  Dispense  Refill  . ibuprofen (ADVIL,MOTRIN) 200 MG tablet   Oral   Take 2 tablets (400 mg total) by mouth every 6 (six) hours as needed for pain.   30 tablet   0   . KETOCONAZOLE, TOPICAL, 1 % SHAM   Apply externally   Apply 1 application topically daily.   200 mL   0    BP 126/89  Pulse 85  Temp(Src) 97.6 F (36.4 C) (Oral)  SpO2 100% Physical Exam  Nursing note and vitals reviewed. Constitutional: She is oriented to person, place, and time. She appears well-developed and well-nourished.  HENT:  Head: Normocephalic and atraumatic.  Eyes: Conjunctivae and EOM are normal. Pupils are equal, round, and reactive to light.  Neck: Normal range of motion. Neck supple.  Cardiovascular: Normal rate and regular rhythm.  Exam reveals no gallop and no friction rub.   No murmur heard. Pulmonary/Chest: Effort normal and breath sounds normal. No respiratory distress. She has no wheezes. She has no rales. She exhibits no tenderness.  Abdominal: Soft. Bowel sounds are normal. She exhibits no distension and no mass. There is no tenderness. There is no rebound and no guarding.  Musculoskeletal: Normal range of motion. She exhibits no edema and no tenderness.  Neurological: She is alert and oriented to person, place, and time.  Skin: Skin is warm and dry.  Psychiatric: She has a normal mood  and affect. Her behavior is normal. Judgment and thought content normal.    ED Course  Procedures (including critical care time) Results for orders placed during the hospital encounter of 01/04/13  CBC      Result Value Range   WBC 11.8 (*) 4.0 - 10.5 K/uL   RBC 4.08  3.87 - 5.11 MIL/uL   Hemoglobin 12.8  12.0 - 15.0 g/dL   HCT 96.0 (*) 45.4 - 09.8 %   MCV 87.0  78.0 - 100.0 fL   MCH 31.4  26.0 - 34.0 pg   MCHC 36.1 (*) 30.0 - 36.0 g/dL   RDW 11.9  14.7 - 82.9 %   Platelets 301  150 - 400 K/uL  BASIC METABOLIC PANEL      Result Value Range   Sodium 134 (*) 135 -  145 mEq/L   Potassium 3.4 (*) 3.5 - 5.1 mEq/L   Chloride 103  96 - 112 mEq/L   CO2 24  19 - 32 mEq/L   Glucose, Bld 107 (*) 70 - 99 mg/dL   BUN 10  6 - 23 mg/dL   Creatinine, Ser 5.62  0.50 - 1.10 mg/dL   Calcium 8.9  8.4 - 13.0 mg/dL   GFR calc non Af Amer >90  >90 mL/min   GFR calc Af Amer >90  >90 mL/min  PRO B NATRIURETIC PEPTIDE      Result Value Range   Pro B Natriuretic peptide (BNP) 109.8  0 - 125 pg/mL  POCT I-STAT TROPONIN I      Result Value Range   Troponin i, poc 0.00  0.00 - 0.08 ng/mL   Comment 3            Dg Chest 2 View  06/03/2013   CLINICAL DATA:  Flu symptoms, smoker, cough  EXAM: CHEST  2 VIEW  COMPARISON:  01/04/2013  FINDINGS: The heart size and mediastinal contours are within normal limits. Both lungs are clear. The visualized skeletal structures are unremarkable.  IMPRESSION: No active cardiopulmonary disease.   Electronically Signed   By: Ruel Favors M.D.   On: 06/03/2013 17:50     EKG Interpretation   None       MDM   1. Viral syndrome     Patient with symptoms consistent with influenza.  Vitals are stable, low-grade fever.  No signs of dehydration, tolerating PO's.  Lungs are clear.  Discussed the cost versus benefit of Tamiflu treatment with the patient.  The patient understands that symptoms are greater than the recommended 24-48 hour window of treatment.  Patient will be discharged with instructions to orally hydrate, rest, and use over-the-counter medications such as anti-inflammatories ibuprofen and Aleve for muscle aches and Tylenol for fever.    Filed Vitals:   06/03/13 1815  BP: 144/88  Pulse: 82  Temp: 97.8 F (36.6 C)      Roxy Horseman, PA-C 06/03/13 1835  Roxy Horseman, PA-C 06/03/13 [phone]

## 2013-06-03 NOTE — ED Provider Notes (Deleted)
Medical screening examination/treatment/procedure(s) were conducted as a shared visit with non-physician practitioner(s) and myself.  I personally evaluated the patient during the encounter.  EKG Interpretation   None         Shelda Jakes, MD 06/03/13 [phone]

## 2013-06-03 NOTE — ED Notes (Signed)
Pt comfortable with d/c and f/u instructions. No prescriptions 

## 2013-06-22 NOTE — ED Notes (Signed)
Medical screening examination/treatment/procedure(s) were performed by non-physician practitioner and as supervising physician I was immediately available for consultation/collaboration.  EKG Interpretation   None         Mervin Kung, MD 06/22/13 1215

## 2013-06-24 ENCOUNTER — Other Ambulatory Visit: Payer: Self-pay

## 2013-06-24 ENCOUNTER — Emergency Department (HOSPITAL_COMMUNITY)
Admission: EM | Admit: 2013-06-24 | Discharge: 2013-06-24 | Disposition: A | Discharge status: Home / Self Care | Payer: BC Managed Care – PPO | Attending: Emergency Medicine | Admitting: Emergency Medicine

## 2013-06-24 ENCOUNTER — Encounter (HOSPITAL_COMMUNITY): Payer: Self-pay | Admitting: Emergency Medicine

## 2013-06-24 ENCOUNTER — Emergency Department (HOSPITAL_COMMUNITY): Payer: BC Managed Care – PPO

## 2013-06-24 DIAGNOSIS — J180 Bronchopneumonia, unspecified organism: Secondary | ICD-10-CM | POA: Insufficient documentation

## 2013-06-24 DIAGNOSIS — M545 Low back pain, unspecified: Secondary | ICD-10-CM | POA: Insufficient documentation

## 2013-06-24 DIAGNOSIS — R079 Chest pain, unspecified: Secondary | ICD-10-CM | POA: Insufficient documentation

## 2013-06-24 DIAGNOSIS — F17200 Nicotine dependence, unspecified, uncomplicated: Secondary | ICD-10-CM | POA: Insufficient documentation

## 2013-06-24 DIAGNOSIS — J029 Acute pharyngitis, unspecified: Secondary | ICD-10-CM | POA: Insufficient documentation

## 2013-06-24 LAB — POCT I-STAT, CHEM 8
BUN: 4 mg/dL — ABNORMAL LOW (ref 6–23)
Calcium, Ion: 1.24 mmol/L — ABNORMAL HIGH (ref 1.12–1.23)
Chloride: 105 mEq/L (ref 96–112)
Creatinine, Ser: 0.8 mg/dL (ref 0.50–1.10)
GLUCOSE: 95 mg/dL (ref 70–99)
HCT: 41 % (ref 36.0–46.0)
HEMOGLOBIN: 13.9 g/dL (ref 12.0–15.0)
POTASSIUM: 3.9 meq/L (ref 3.7–5.3)
Sodium: 139 mEq/L (ref 137–147)
TCO2: 24 mmol/L (ref 0–100)

## 2013-06-24 MED ORDER — LEVOFLOXACIN 750 MG PO TABS
750.0000 mg | ORAL_TABLET | Freq: Every day | ORAL | Status: DC
Start: 2013-06-24 — End: 2013-06-24
  Administered 2013-06-24: 750 mg via ORAL
  Filled 2013-06-24: qty 1

## 2013-06-24 MED ORDER — BENZONATATE 100 MG PO CAPS: 100.0000 mg | ORAL_CAPSULE | Freq: Three times a day (TID) | ORAL | Status: DC

## 2013-06-24 MED ORDER — SODIUM CHLORIDE 0.9 % IV BOLUS (SEPSIS)
1000.0000 mL | Freq: Once | INTRAVENOUS | Status: AC
  Administered 2013-06-24: 1000 mL via INTRAVENOUS

## 2013-06-24 MED ORDER — BENZONATATE 100 MG PO CAPS
100.0000 mg | ORAL_CAPSULE | Freq: Once | ORAL | Status: AC
  Administered 2013-06-24: 100 mg via ORAL
  Filled 2013-06-24: qty 1

## 2013-06-24 MED ORDER — HYDROCODONE-HOMATROPINE 5-1.5 MG/5ML PO SYRP: 5.0000 mL | ORAL_SOLUTION | Freq: Once | ORAL | Status: DC

## 2013-06-24 MED ORDER — LEVOFLOXACIN 750 MG PO TABS: 750.0000 mg | ORAL_TABLET | Freq: Every day | ORAL | Status: DC

## 2013-06-24 NOTE — ED Provider Notes (Signed)
CSN: [id number]     Arrival date & time 06/24/13  6967 History   First MD Initiated Contact with Patient 06/24/13 0654     No chief complaint on file.  (Consider location/radiation/quality/duration/timing/severity/associated sxs/prior Treatment) HPI  48 year old female presents for evaluation of generalized body aches and productive cough. Patient reports she was seen in the ED 3 weeks ago for similar symptoms and was diagnosed with having influenza. She was discharge with supportive care however her symptoms continued to persist. States she continues to have nose, sneezing, sore throat, body aches, low back pain, cough productive with green sputum, pleuritic chest pain with cough. She has been taking over-the-counter medication including Tylenol, ibuprofen, TheraFlu with some relief. Patient states she took 2 weeks out of work and a return back to work for the past week. 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 States the symptom is now worsening. She denies any recent travel. She denies severe headache, neck stiffness, hemoptysis, nausea vomiting diarrhea, or rash. She has no chronic medical problem. She has no risk factors for PE or DVT  No past medical history on file. Past Surgical History  Procedure Laterality Date  . Cesarean section    . Abdominal hysterectomy     Family History  Problem Relation Age of Onset  . Hypertension Other   . Diabetes Other    History  Substance Use Topics  . Smoking status: Current Every Day Smoker  . Smokeless tobacco: Not on file  . Alcohol Use: No   OB History   Grav Para Term Preterm Abortions TAB SAB Ect Mult Living                 Review of Systems  All other systems reviewed and are negative.    Allergies  Hydrocodone  Home Medications   Current Outpatient Rx  Name  Route  Sig  Dispense  Refill  . dextromethorphan-guaiFENesin (MUCINEX DM) 30-600 MG per 12 hr tablet   Oral   Take 2 tablets by mouth  every 4 (four) hours as needed for cough.         Marland Kitchen ibuprofen (ADVIL,MOTRIN) 200 MG tablet   Oral   Take 800 mg by mouth every 6 (six) hours as needed for mild pain.          There were no vitals taken for this visit. Physical Exam  Nursing note and vitals reviewed. Constitutional: She is oriented to person, place, and time. She appears well-developed and well-nourished. No distress.  HENT:  Head: Atraumatic.  Right Ear: External ear normal.  Left Ear: External ear normal.  Nose: Nose normal.  Mouth/Throat: Oropharynx is clear and moist.  Eyes: Conjunctivae are normal.  Neck: Normal range of motion. Neck supple.  No nuchal rigidity  Cardiovascular: Normal rate and regular rhythm.   Pulmonary/Chest: Effort normal and breath sounds normal. She has no wheezes. She has no rales.  Abdominal: Soft. There is no tenderness.  Musculoskeletal: She exhibits no edema.  Lymphadenopathy:    She has no cervical adenopathy.  Neurological: She is alert and oriented to person, place, and time.  Skin: No rash noted.  Psychiatric: She has a normal mood and affect.    ED Course  Procedures (including critical care time)   Date: 06/24/2013  Rate: 77  Rhythm: normal sinus rhythm  QRS Axis: normal  Intervals: normal  ST/T Wave abnormalities: normal  Conduction Disutrbances:none  Narrative Interpretation:   Old EKG  Reviewed: unchanged    7:18 AM Patient in with flulike symptoms. No hypoxia to suggest pneumonia. She is afebrile with stable normal vital sign. Due to chronicity of her symptoms, will check electrolytes, chest x-ray and we'll give symptomatic treatment. She is PERC negative, and TIMI 0. Care discussed with attending.  8:37 AM Chest CT with evidence to suggest bronchopneumonia.  Will treat with levaquin.  Pt does have hx of smoking, and since pulmonary nodule cannot be excluded i recommend f/u chest CT in 3-6 months.  Smoking cessation discussed.  Pt agrees with plan.   Resources provided.    Labs Review Labs Reviewed  POCT I-STAT, CHEM 8 - Abnormal; Notable for the following:    BUN 4 (*)    Calcium, Ion 1.24 (*)    All other components within normal limits   Imaging Review Dg Chest 2 View  06/24/2013   CLINICAL DATA:  Shortness of breath and cough.  EXAM: CHEST  2 VIEW  COMPARISON:  DG CHEST 2 VIEW dated 06/03/2013; DG CHEST 2 VIEW dated 01/04/2013  FINDINGS: Trachea is midline. Heart size normal. There is a faint area of added density in the left midlung zone, questionably present on the prior examination. Lungs are otherwise grossly clear. No pleural fluid.  IMPRESSION: Possible added density in the left midlung zone, which can be seen with bronchopneumonia. Pulmonary nodule cannot be excluded. Given the patient's persistent cough and shortness of breath, considered CT chest without contrast in further evaluation, as clinically indicated. These results were called by telephone at the time of interpretation on 06/24/2013 at 7:44 AM to Nicolette Bang, R.N., who verbally acknowledged these results.   Electronically Signed   By: Lorin Picket M.D.   On: 06/24/2013 07:45   Ct Chest Wo Contrast  06/24/2013   CLINICAL DATA:  48 year old female with pain, body ache. Shortness of breath and cough. Initial encounter.  EXAM: CT CHEST WITHOUT CONTRAST  TECHNIQUE: Multidetector CT imaging of the chest was performed following the standard protocol without IV contrast.  COMPARISON:  Chest radiographs from the same day reported separately. CT Abdomen and Pelvis 07/04/2009.  FINDINGS: Major airways are patent. There is some paraseptal emphysema in the lung apices.  There are small number of scattered bilaterally, mostly peribronchovascular, irregular opacities. The most pronounced of these are at the mid lung level bilaterally, series 3 image 22 on the left (corresponding to the finding on the earlier radiograph), and more peripherally on image 20 in the right lung. These have  irregular margins. There is also a small are more round 7-8 mm pulmonary nodule in the left upper lobe on image 15. There is mild peribronchial thickening and bronchiectasis in the right apex on image 12. There is a 14 mm area of peribronchial opacity in the right lower lobe with mild bronchiectasis on image 36. There is a smaller irregular area of peribronchovascular opacity in the medial basal segment of the right lower lobe on image 44.  No pericardial or pleural effusion. No mediastinal lymphadenopathy. Negative non contrast thoracic inlet. No axillary lymphadenopathy.  In the right upper abdomen adjacent to the diaphragm there is an oval 19 x 13 mm lesion which is partially densely calcified. This was present in 2011 but less conspicuous and more so SI all at that time (measuring up to 7 mm in thickness). The adjacent liver parenchyma appears within normal limits. No superior diaphragmatic or upper abdominal lymphadenopathy.  Chronic right midpole nephrolithiasis. Punctate left renal midpole calculus. No  evidence of renal obstruction. Other visualized upper abdominal viscera and bowel are within normal limits.  No acute or suspicious osseous lesion.  IMPRESSION: 1. Multiple small scattered bilateral areas of irregular peribronchovascular opacity, most likely infectious. However, there are Mild underlying emphysematous and bronchiectatic changes, and at least 1 of the areas identified today appears nodular. Therefore follow-up chest CT in 3-6 months is recommended. This recommendation follows the consensus statement: Guidelines for Management of Small Pulmonary Nodules Detected on CT Scans: A Statement from the [employer] as published in Radiology 2005; 237:395-400. 2. No other acute process in the chest. 3. Small chronic lesion situated adjacent to the diaphragm and liver dome with dystrophic calcifications. This is only mildly increased in size since 2011. Favor postinflammatory or posttraumatic  sequelae. Attention can be directed on followup. 4. Nephrolithiasis.   Electronically Signed   By: Lars Pinks M.D.   On: 06/24/2013 08:25    EKG Interpretation   None       MDM   1. Bronchopneumonia    BP 145/79  Pulse 78  Temp(Src) 97.7 F (36.5 C) (Oral)  Resp 13  SpO2 100%  I have reviewed nursing notes and vital signs. I personally reviewed the imaging tests through PACS system  I reviewed available ER/hospitalization records thought the EMR     Domenic Moras, [place] 06/24/13 0845

## 2013-06-24 NOTE — ED Notes (Signed)
Patient transported to CT 

## 2013-06-24 NOTE — Discharge Instructions (Signed)
Please take antibiotic for the full duration.  Stop smoking and have a repeat chest CT scan in 3-6 months to ensure no worsening finding concerning for lung cancer.  Uses resources below to find primary care provider.  Pneumonia, Adult Pneumonia is an infection of the lungs.  CAUSES Pneumonia may be caused by bacteria or a virus. Usually, these infections are caused by breathing infectious particles into the lungs (respiratory tract). SYMPTOMS   Cough.  Fever.  Chest pain.  Increased rate of breathing.  Wheezing.  Mucus production. DIAGNOSIS  If you have the common symptoms of pneumonia, your caregiver will typically confirm the diagnosis with a chest X-ray. The X-ray will show an abnormality in the lung (pulmonary infiltrate) if you have pneumonia. Other tests of your blood, urine, or sputum may be done to find the specific cause of your pneumonia. Your caregiver may also do tests (blood gases or pulse oximetry) to see how well your lungs are working. TREATMENT  Some forms of pneumonia may be spread to other people when you cough or sneeze. You may be asked to wear a mask before and during your exam. Pneumonia that is caused by bacteria is treated with antibiotic medicine. Pneumonia that is caused by the influenza virus may be treated with an antiviral medicine. Most other viral infections must run their course. These infections will not respond to antibiotics.  PREVENTION A pneumococcal shot (vaccine) is available to prevent a common bacterial cause of pneumonia. This is usually suggested for:  People over 67 years old.  Patients on chemotherapy.  People with chronic lung problems, such as bronchitis or emphysema.  People with immune system problems. If you are over 65 or have a high risk condition, you may receive the pneumococcal vaccine if you have not received it before. In some countries, a routine influenza vaccine is also recommended. This vaccine can help prevent some cases  of pneumonia.You may be offered the influenza vaccine as part of your care. If you smoke, it is time to quit. You may receive instructions on how to stop smoking. Your caregiver can provide medicines and counseling to help you quit. HOME CARE INSTRUCTIONS   Cough suppressants may be used if you are losing too much rest. However, coughing protects you by clearing your lungs. You should avoid using cough suppressants if you can.  Your caregiver may have prescribed medicine if he or she thinks your pneumonia is caused by a bacteria or influenza. Finish your medicine even if you start to feel better.  Your caregiver may also prescribe an expectorant. This loosens the mucus to be coughed up.  Only take over-the-counter or prescription medicines for pain, discomfort, or fever as directed by your caregiver.  Do not smoke. Smoking is a common cause of bronchitis and can contribute to pneumonia. If you are a smoker and continue to smoke, your cough may last several weeks after your pneumonia has cleared.  A cold steam vaporizer or humidifier in your room or home may help loosen mucus.  Coughing is often worse at night. Sleeping in a semi-upright position in a recliner or using a couple pillows under your head will help with this.  Get rest as you feel it is needed. Your body will usually let you know when you need to rest. SEEK IMMEDIATE MEDICAL CARE IF:   Your illness becomes worse. This is especially true if you are elderly or weakened from any other disease.  You cannot control your cough with suppressants and  are losing sleep.  You begin coughing up blood.  You develop pain which is getting worse or is uncontrolled with medicines.  You have a fever.  Any of the symptoms which initially brought you in for treatment are getting worse rather than better.  You develop shortness of breath or chest pain. MAKE SURE YOU:   Understand these instructions.  Will watch your condition.  Will  get help right away if you are not doing well or get worse. Document Released: 05/28/2005 Document Revised: 08/20/2011 Document Reviewed: 08/17/2010 [facility] Patient Information 2014 [place], [place].   Emergency Department Resource Guide 1) Find a Doctor and Pay Out of Pocket Although you won't have to find out who is covered by your insurance plan, it is a good idea to ask around and get recommendations. You will then need to call the office and see if the doctor you have chosen will accept you as a new patient and what types of options they offer for patients who are self-pay. Some doctors offer discounts or will set up payment plans for their patients who do not have insurance, but you will need to ask so you aren't surprised when you get to your appointment.  2) Contact Your Local Health Department Not all health departments have doctors that can see patients for sick visits, but many do, so it is worth a call to see if yours does. If you don't know where your local health department is, you can check in your phone book. The CDC also has a tool to help you locate your state's health department, and many state websites also have listings of all of their local health departments.  3) Find a Walk-in Clinic If your illness is not likely to be very severe or complicated, you may want to try a walk in clinic. These are popping up all over the country in pharmacies, drugstores, and shopping centers. They're usually staffed by nurse practitioners or physician assistants that have been trained to treat common illnesses and complaints. They're usually fairly quick and inexpensive. However, if you have serious medical issues or chronic medical problems, these are probably not your best option.  No Primary Care Doctor: - Call Health Connect at  [phone] - they can help you locate a primary care doctor that  accepts your insurance, provides certain services, etc. - Physician Referral Service-  [phone]  Chronic Pain Problems: Organization         Address  Phone   Notes  [facility]  [phone] Patients need to be referred by their primary care doctor.   Medication Assistance: Organization         Address  Phone   Notes  [facility] Med[facility] [address] [place], [place] [address] [phone] --Must be a resident of [facility] -- Must have NO insurance coverage whatsoever (no Medicaid/ Medicare, etc.) -- The pt. MUST have a primary care doctor that directs their care regularly and follows them in the community   [facility]ist  [phone]   [employer]  [phone]    Agencies that provide inexpensive medical care: Organization         Address  Phone   Notes  [facility]  [phone]   [facility]    [phone]   [facility] [address] [place], [place] [address] [phone]   [facility] [address] [place]  [place] [phone]) [phone]   Planned Parenthood    [phone]   [facility]    [phone]   [facility]  [address], [place] Phone:  [phone](336) [phone], Fax:  [phone] Hours of Operation:  9 am - 6 pm, M-F.  Also accepts Medicaid/Medicare and self-pay.  [facility]  [address], [place] Phone: [phone](336) [phone], Fax: [phone]) [phone]. Hours of Operation:  8:30 am - 5:30 pm, M-F.  Also accepts Medicaid and self-pay.  [facility] [place] [address], [place] Phone: [phone](336) [phone]   [facility] [address], [place], [place] [phone], Ext. [phone] Mondays & Thursdays: 7-9 AM.  First 15 patients are seen on a first come, first serve basis.    Medicaid-accepting [facility] Providers:  Organization         Address  Phone   Notes  [facility] [address],  [place] [phone]) [phone] Also accepts self-pay patients.  [facility] [address], [place]  [phone]) [phone]   [facility] [address], [place] [phone]) [phone]   [facility] [address] [place] [address], [place] [phone]) [phone]   Renaye RakersVeita Bland [address], [place]   [phone](336) [phone] Only accepts [place] Access [place] patients after they have their name applied to their card.   Self-Pay (no insurance) in [facility]:  Organization         Address  Phone   Notes  Sickle Cell Patients, [facility] [address], [place] [phone]) [phone]   [facility] [address], [place] [phone]) [phone]   [facility]  [address], [place] [phone]) [phone]   [facility]/Dr. Osei-Bonsu  [address] [place] [address], [place] or [phone], [place] [phone]) [phone] Phone number for both [place] and [place] locations is the same.  [facility] [address], [place] [phone]) [phone]   [facility] [place] [address] [place] [address], [place] or [address] [phone]) [phone]   [facility] [address], [place] [phone](336) [phone], phone; [phone](336) [phone], fax Sees patients 1st and 3rd Saturday of every month.  Must not qualify for public or private insurance (i.e. Medicaid, Medicare, [place] Health Choice, Veterans' Benefits)  Household income should be no more than 200% of the poverty level The clinic cannot treat you if you are pregnant or think you are pregnant  Sexually transmitted diseases are not treated at the clinic.    Dental Care: Organization         Address  Phone  Notes  [facility] of [facility] [address], [place] [phone]) [phone] Accepts children up to age 48 who are enrolled in  [place] or [place] Health Choice; pregnant women with a Medicaid card; and children who have applied for Medicaid or [place] Health Choice, but were declined, whose parents can pay a reduced fee at time of service.  [facility] of [facility] [place]  [address], [place] [phone]) [phone] Accepts children up to age 48 who are enrolled in [place] or [place] Health Choice; pregnant women with a Medicaid card; and children who have applied for Medicaid or [place]  Health Choice, but were declined, whose parents can pay a reduced fee at time of service.  [facility]  [address] [place], [place] [phone] Patients are seen by appointment only. Walk-ins are not accepted. [facility] will see patients 97 years of age and older. Monday - Tuesday (8am-5pm) Most Wednesdays (8:30-5pm) $30 per visit, cash only  [facility] Access PRO[facility]  [address], [facility] [phone] Patients are seen by appointment only. Walk-ins are not accepted. [facility] will see patients 58 years of age and older. One Wednesday Evening (Monthly: Volunteer Based).  $30 per visit, cash only  [employer]  [phone] for adults; Children under age 59, call Graduate Pediatric Dentistry at [phone]. Children aged 33-14, please call [phone] to request a pediatric application.  Dental services are provided in all areas of dental care including fillings, crowns and bridges, complete and partial dentures, implants, gum treatment, root canals, and extractions. Preventive care is also provided. Treatment is provided to both adults and children. Patients are selected via a lottery and there is often a waiting list.   [facility] [address] [place]  [phone] [email or web address]   [facility] [address] [place], [place] [phone], Ext. [phone] Second and Fourth Thursday of each month, opens at 6:30  AM; Clinic ends at 9 AM.  Patients are seen on a first-come first-served basis, and a limited number are seen during each clinic.   [facility]  [address] [place] [place], [place] [phone]   Eligibility Requirements You must have lived in [place], [place], or [place] counties for at least the last three months.   You cannot be eligible for state or federal sponsored [place], including [employer], [place], or [employer].   You generally cannot be eligible for healthcare insurance through your employer.    How to apply: Eligibility screenings are held every Tuesday and Wednesday afternoon from 1:00 pm until 4:00 pm. You do not need an appointment for the interview!  [facility] [address] [place], [place] [phone]   [facility] Health Department  [phone]   [facility] Health Department  [phone]   [facility] Health Department  [phone]    Behavioral Health Resources in the Community: Intensive Outpatient Programs Organization         Address  Phone  Notes  [facility] [address] [place], [place] [phone]   [facility] [address], [place], [place] [phone]   [facility] [address], [place], [place]  [phone]   [facility] [address]  [place], [place] [phone] or [phone]   Substance Abuse Resources Organization         Address  Phone  Notes  [facility] Drug Services  [phone]   [facility]  [phone]   [facility] [place]  [phone]   Floydene Flock  [phone]   Residential & Outpatient Substance Abuse Program  [phone]   Psychological Services Organization         Address  Phone  Notes  [facility]  [phone]   [facility] Services  [phone]   [facility] Health [address], [place] [phone] or  [phone]    Mobile Crisis Teams Organization         Address  Phone  Notes  [facility], Mobile Crisis Care Unit  [phone]   [facility]  [address] [place], [place]   [facility] [address] [place] [phone]    Self-Help/Support Groups Organization         Address  Phone             Notes  [facility] [place] - variety of support groups  [place] Call for more information  Narcotics Anonymous (NA), [facility] [address], [employer] Hope  2 meetings at this location   Special educational needs teacher         Address  Phone  Notes  [facility] Residential [facility] [place],    [place]  [phone])[phone]   [facility]  [address] [place] [id number], [place], [place]   [place], [place] [phone] Admissions: 8am-3pm M-F  Incentives Substance [place] [address]    [place], [place] [phone]   [facility] [address] [place], [place], [place]   [facility] [address],  [place], [place]   [facility] Programs - Intensive Outpatient [address], [place], [place]   [facility] ([place] [place].,  [place], [place] [phone] or [phone]   [facility] (RTS) [address] [place], [place] Accepts Medicaid  [facility] [place] [address],  [place] [phone] Substance Abuse/Addiction Treatment   [facility] Organization         Address  Phone  Notes  [facility]  [phone]   Domenic Schwab, PhD [address] [place], [place]   [phone] or [phone]   Deltana [place], [place] [phone]   [facility] [address]  [place], [place] [phone] Insurance/Medicaid/sponsorship through [facility] and Families [address], [address] [place]                                    [place], [place] [phone] [place] [address] Street[address]ib[address], [place] [phone]    Dr. Adele Schilder  [phone]   [facility] Dept. 1) [address], [place] 2) [place] 3)  [place] [address], [place] [phone]  [phone]   [place] [phone] or [phone] (After Hours)

## 2013-06-24 NOTE — ED Notes (Signed)
Pt states she was told she had the flu  3 weeks ago but has not gotten better. Pt c/o of generalized body aches. Pt states she is coughing up green sputum.

## 2013-06-24 NOTE — ED Notes (Signed)
PA at bedside.

## 2013-06-24 NOTE — ED Notes (Signed)
Patient transported to X-ray 

## 2013-06-24 NOTE — ED Provider Notes (Signed)
Medical screening examination/treatment/procedure(s) were performed by non-physician practitioner and as supervising physician I was immediately available for consultation/collaboration.  EKG Interpretation   None         Elmer Sow, MD 06/24/13 1745

## 2013-06-24 NOTE — ED Notes (Signed)
Pt returned from CT °

## 2013-06-24 NOTE — ED Notes (Signed)
Pt returned from xray

## 2013-07-01 ENCOUNTER — Emergency Department (HOSPITAL_COMMUNITY)
Admission: EM | Admit: 2013-07-01 | Discharge: 2013-07-01 | Disposition: A | Discharge status: Home / Self Care | Payer: BC Managed Care – PPO | Attending: Emergency Medicine | Admitting: Emergency Medicine

## 2013-07-01 ENCOUNTER — Emergency Department (HOSPITAL_COMMUNITY): Payer: BC Managed Care – PPO

## 2013-07-01 ENCOUNTER — Encounter (HOSPITAL_COMMUNITY): Payer: Self-pay | Admitting: Emergency Medicine

## 2013-07-01 DIAGNOSIS — Z8701 Personal history of pneumonia (recurrent): Secondary | ICD-10-CM | POA: Insufficient documentation

## 2013-07-01 DIAGNOSIS — M25569 Pain in unspecified knee: Secondary | ICD-10-CM | POA: Insufficient documentation

## 2013-07-01 DIAGNOSIS — F17200 Nicotine dependence, unspecified, uncomplicated: Secondary | ICD-10-CM | POA: Insufficient documentation

## 2013-07-01 DIAGNOSIS — M25562 Pain in left knee: Secondary | ICD-10-CM

## 2013-07-01 MED ORDER — IBUPROFEN 800 MG PO TABS
800.0000 mg | ORAL_TABLET | Freq: Four times a day (QID) | ORAL | Status: DC | PRN
Start: 2013-07-01 — End: 2014-09-02

## 2013-07-01 MED ORDER — IBUPROFEN 800 MG PO TABS
800.0000 mg | ORAL_TABLET | Freq: Once | ORAL | Status: AC
  Administered 2013-07-01: 800 mg via ORAL
  Filled 2013-07-01: qty 1

## 2013-07-01 NOTE — ED Notes (Signed)
Pt left without discharge instructions, pt just wanted note for work

## 2013-07-01 NOTE — ED Notes (Signed)
Family at bedside. 

## 2013-07-01 NOTE — Discharge Instructions (Signed)
Arthralgia °Your caregiver has diagnosed you as suffering from an arthralgia. Arthralgia means there is pain in a joint. This can come from many reasons including: °· Bruising the joint which causes soreness (inflammation) in the joint. °· Wear and tear on the joints which occur as we grow older (osteoarthritis). °· Overusing the joint. °· Various forms of arthritis. °· Infections of the joint. °Regardless of the cause of pain in your joint, most of these different pains respond to anti-inflammatory drugs and rest. The exception to this is when a joint is infected, and these cases are treated with antibiotics, if it is a bacterial infection. °HOME CARE INSTRUCTIONS  °· Rest the injured area for as long as directed by your caregiver. Then slowly start using the joint as directed by your caregiver and as the pain allows. Crutches as directed may be useful if the ankles, knees or hips are involved. If the knee was splinted or casted, continue use and care as directed. If an stretchy or elastic wrapping bandage has been applied today, it should be removed and re-applied every 3 to 4 hours. It should not be applied tightly, but firmly enough to keep swelling down. Watch toes and feet for swelling, bluish discoloration, coldness, numbness or excessive pain. If any of these problems (symptoms) occur, remove the ace bandage and re-apply more loosely. If these symptoms persist, contact your caregiver or return to this location. °· For the first 24 hours, keep the injured extremity elevated on pillows while lying down. °· Apply ice for 15-20 minutes to the sore joint every couple hours while awake for the first half day. Then 03-04 times per day for the first 48 hours. Put the ice in a plastic bag and place a towel between the bag of ice and your skin. °· Wear any splinting, casting, elastic bandage applications, or slings as instructed. °· Only take over-the-counter or prescription medicines for pain, discomfort, or fever as  directed by your caregiver. Do not use aspirin immediately after the injury unless instructed by your physician. Aspirin can cause increased bleeding and bruising of the tissues. °· If you were given crutches, continue to use them as instructed and do not resume weight bearing on the sore joint until instructed. °Persistent pain and inability to use the sore joint as directed for more than 2 to 3 days are warning signs indicating that you should see a caregiver for a follow-up visit as soon as possible. Initially, a hairline fracture (break in bone) may not be evident on X-rays. Persistent pain and swelling indicate that further evaluation, non-weight bearing or use of the joint (use of crutches or slings as instructed), or further X-rays are indicated. X-rays may sometimes not show a small fracture until a week or 10 days later. Make a follow-up appointment with your own caregiver or one to whom we have referred you. A radiologist (specialist in reading X-rays) may read your X-rays. Make sure you know how you are to obtain your X-ray results. Do not assume everything is normal if you do not hear from us. °SEEK MEDICAL CARE IF: °Bruising, swelling, or pain increases. °SEEK IMMEDIATE MEDICAL CARE IF:  °· Your fingers or toes are numb or blue. °· The pain is not responding to medications and continues to stay the same or get worse. °· The pain in your joint becomes severe. °· You develop a fever over 102° F (38.9° C). °· It becomes impossible to move or use the joint. °MAKE SURE YOU:  °·   Understand these instructions.  Will watch your condition.  Will get help right away if you are not doing well or get worse. Document Released: 05/28/2005 Document Revised: 08/20/2011 Document Reviewed: 01/14/2008 [facility] Patient Information 2014 [place].  Knee Pain Knee pain can be a result of an injury or other medical conditions. Treatment will depend on the cause of your pain. HOME CARE  Only take medicine as  told by your doctor.  Keep a healthy weight. Being overweight can make the knee hurt more.  Stretch before exercising or playing sports.  If there is constant knee pain, change the way you exercise. Ask your doctor for advice.  Make sure shoes fit well. Choose the right shoe for the sport or activity.  Protect your knees. Wear kneepads if needed.  Rest when you are tired. GET HELP RIGHT AWAY IF:   Your knee pain does not stop.  Your knee pain does not get better.  Your knee joint feels hot to the touch.  You have a fever. MAKE SURE YOU:   Understand these instructions.  Will watch this condition.  Will get help right away if you are not doing well or get worse. Document Released: 08/24/2008 Document Revised: 08/20/2011 Document Reviewed: 08/24/2008 [facility] Patient Information 2014 [place], [place].

## 2013-07-01 NOTE — ED Notes (Signed)
Pt c/o left knee pain, Pt drives school bus for living, pt used to run track in high school.

## 2013-07-01 NOTE — ED Notes (Signed)
Pt. reports left knee pain with swelling onset last week , denies injury or fall, ambulatory.

## 2013-07-01 NOTE — ED Provider Notes (Signed)
CSN: [id number]     Arrival date & time 07/01/13  0430 History   First MD Initiated Contact with Patient 07/01/13 [phone]     Chief Complaint  Patient presents with  . Knee Pain   (Consider location/radiation/quality/duration/timing/severity/associated sxs/prior Treatment) HPI Comments: Patient is 48 year old female who presents to the ED with complaints of left knee pain for 1 week - recently treated for viral illness and later CAP. States these symptoms are improving but that then she noticed the knee hurting and swelling.  She denies any injury to the knee - reports pain worse with ambulation and with "letting it dangle".  Subjective swelling,   Reports pain to medial knee.  Patient is a 48 y.o. female presenting with knee pain. The history is provided by the patient and the spouse. No language interpreter was used.  Knee Pain Location:  Knee Time since incident:  1 week Injury: no   Knee location:  L knee Pain details:    Quality:  Dull and pressure   Radiates to:  Does not radiate   Severity:  Moderate   Onset quality:  Gradual   Duration:  1 week   Timing:  Constant   Progression:  Worsening Chronicity:  New Dislocation: no   Foreign body present:  No foreign bodies Tetanus status:  Up to date Prior injury to area:  No Relieved by:  Nothing Worsened by:  Nothing tried Ineffective treatments:  None tried Associated symptoms: decreased ROM, stiffness and swelling   Associated symptoms: no back pain, no fatigue, no fever, no muscle weakness and no numbness     History reviewed. No pertinent past medical history. Past Surgical History  Procedure Laterality Date  . Cesarean section    . Abdominal hysterectomy     Family History  Problem Relation Age of Onset  . Hypertension Other   . Diabetes Other    History  Substance Use Topics  . Smoking status: Current Every Day Smoker  . Smokeless tobacco: Not on file  . Alcohol Use: No   OB History   Grav Para Term Preterm  Abortions TAB SAB Ect Mult Living                 Review of Systems  Constitutional: Negative for fever and fatigue.  Musculoskeletal: Positive for stiffness. Negative for back pain.  All other systems reviewed and are negative.    Allergies  Hydrocodone  Home Medications   Current Outpatient Rx  Name  Route  Sig  Dispense  Refill  . benzonatate (TESSALON) 100 MG capsule   Oral   Take 1 capsule (100 mg total) by mouth every 8 (eight) hours.   21 capsule   0   . ibuprofen (ADVIL,MOTRIN) 200 MG tablet   Oral   Take 800 mg by mouth every 6 (six) hours as needed for mild pain.         Marland Kitchen levofloxacin (LEVAQUIN) 750 MG tablet   Oral   Take 1 tablet (750 mg total) by mouth daily. X 7 days   7 tablet   0   . Phenylephrine-Pheniramine-DM (THERAFLU COLD & COUGH PO)   Oral   Take 1 Package by mouth every 6 (six) hours as needed (cough and cold symptoms).           BP 126/91  Pulse 86  Temp(Src) 97.9 F (36.6 C) (Oral)  Resp 18  SpO2 99% Physical Exam  Nursing note and vitals reviewed. Constitutional: She is  oriented to person, place, and time. She appears well-developed and well-nourished. No distress.  HENT:  Head: Atraumatic.  Mouth/Throat: Oropharynx is clear and moist.  Eyes: Conjunctivae are normal. No scleral icterus.  Pulmonary/Chest: Effort normal.  Musculoskeletal:       Left knee: She exhibits decreased range of motion. She exhibits no swelling, no effusion, no LCL laxity, normal patellar mobility and no MCL laxity. Tenderness found. Medial joint line tenderness noted. No lateral joint line and no patellar tendon tenderness noted.  Neurological: She is alert and oriented to person, place, and time. She exhibits normal muscle tone. Coordination normal.  Skin: Skin is warm and dry. No rash noted. No erythema. No pallor.  Psychiatric: She has a normal mood and affect. Her behavior is normal. Judgment and thought content normal.    ED Course  Procedures  (including critical care time) Labs Review Labs Reviewed - No data to display Imaging Review Dg Knee Complete 4 Views Left  07/01/2013   CLINICAL DATA:  Left knee pain and stiffness.  EXAM: LEFT KNEE - COMPLETE 4+ VIEW  COMPARISON:  None.  FINDINGS: There is no evidence of fracture or dislocation. The joint spaces are preserved. No significant degenerative change is seen; the patellofemoral joint is grossly unremarkable in appearance.  Trace knee joint fluid remains within normal limits. The visualized soft tissues are normal in appearance.  IMPRESSION: No evidence of fracture or dislocation.   Electronically Signed   By: Garald Balding M.D.   On: 07/01/2013 05:08    EKG Interpretation   None       MDM  Left knee pain  Patient with left knee pain for 1 week - has not tried any medication for this - will place in knee sleeve, NSAIDS and RICE.  Plan conservative treatment and will follow up with ortho if needed.  Doubt internal derangement, septic arthritis, GOUT.   Idalia Needle Joelyn Oms, [place] 07/01/13 [phone]

## 2013-07-03 NOTE — ED Provider Notes (Signed)
Medical screening examination/treatment/procedure(s) were performed by non-physician practitioner and as supervising physician I was immediately available for consultation/collaboration.  EKG Interpretation   None         Houston Siren, MD 07/03/13 [phone]

## 2014-09-02 ENCOUNTER — Emergency Department (HOSPITAL_COMMUNITY): Payer: BLUE CROSS/BLUE SHIELD

## 2014-09-02 ENCOUNTER — Emergency Department (HOSPITAL_COMMUNITY)
Admission: EM | Admit: 2014-09-02 | Discharge: 2014-09-02 | Disposition: A | Discharge status: Home / Self Care | Payer: BLUE CROSS/BLUE SHIELD | Attending: Emergency Medicine | Admitting: Emergency Medicine

## 2014-09-02 ENCOUNTER — Encounter (HOSPITAL_COMMUNITY): Payer: Self-pay

## 2014-09-02 DIAGNOSIS — Z720 Tobacco use: Secondary | ICD-10-CM | POA: Diagnosis not present

## 2014-09-02 DIAGNOSIS — R197 Diarrhea, unspecified: Secondary | ICD-10-CM | POA: Insufficient documentation

## 2014-09-02 DIAGNOSIS — R51 Headache: Secondary | ICD-10-CM | POA: Diagnosis not present

## 2014-09-02 DIAGNOSIS — J029 Acute pharyngitis, unspecified: Secondary | ICD-10-CM | POA: Insufficient documentation

## 2014-09-02 DIAGNOSIS — R059 Cough, unspecified: Secondary | ICD-10-CM

## 2014-09-02 DIAGNOSIS — R05 Cough: Secondary | ICD-10-CM | POA: Insufficient documentation

## 2014-09-02 DIAGNOSIS — R079 Chest pain, unspecified: Secondary | ICD-10-CM | POA: Diagnosis present

## 2014-09-02 DIAGNOSIS — R6889 Other general symptoms and signs: Secondary | ICD-10-CM

## 2014-09-02 LAB — I-STAT TROPONIN, ED: Troponin i, poc: 0 ng/mL (ref 0.00–0.08)

## 2014-09-02 LAB — CBC WITH DIFFERENTIAL/PLATELET
Basophils Absolute: 0.1 10*3/uL (ref 0.0–0.1)
Basophils Relative: 1 % (ref 0–1)
Eosinophils Absolute: 0.4 10*3/uL (ref 0.0–0.7)
Eosinophils Relative: 3 % (ref 0–5)
HEMATOCRIT: 38.9 % (ref 36.0–46.0)
Hemoglobin: 13.4 g/dL (ref 12.0–15.0)
Lymphocytes Relative: 25 % (ref 12–46)
Lymphs Abs: 2.7 10*3/uL (ref 0.7–4.0)
MCH: 30.7 pg (ref 26.0–34.0)
MCHC: 34.4 g/dL (ref 30.0–36.0)
MCV: 89 fL (ref 78.0–100.0)
MONO ABS: 0.8 10*3/uL (ref 0.1–1.0)
MONOS PCT: 7 % (ref 3–12)
NEUTROS ABS: 7 10*3/uL (ref 1.7–7.7)
Neutrophils Relative %: 64 % (ref 43–77)
Platelets: 321 10*3/uL (ref 150–400)
RBC: 4.37 MIL/uL (ref 3.87–5.11)
RDW: 12.8 % (ref 11.5–15.5)
WBC: 11 10*3/uL — ABNORMAL HIGH (ref 4.0–10.5)

## 2014-09-02 LAB — COMPREHENSIVE METABOLIC PANEL
ALBUMIN: 3.7 g/dL (ref 3.5–5.2)
ALT: 18 U/L (ref 0–35)
AST: 20 U/L (ref 0–37)
Alkaline Phosphatase: 86 U/L (ref 39–117)
Anion gap: 8 (ref 5–15)
BUN: 13 mg/dL (ref 6–23)
CO2: 22 mmol/L (ref 19–32)
CREATININE: 0.78 mg/dL (ref 0.50–1.10)
Calcium: 9 mg/dL (ref 8.4–10.5)
Chloride: 107 mmol/L (ref 96–112)
GFR calc Af Amer: >90 mL/min (ref >90)
GFR calc non Af Amer: >90 mL/min (ref >90)
Glucose, Bld: 99 mg/dL (ref 70–99)
POTASSIUM: 3.3 mmol/L — AB (ref 3.5–5.1)
Sodium: 137 mmol/L (ref 135–145)
TOTAL PROTEIN: 6.8 g/dL (ref 6.0–8.3)
Total Bilirubin: 0.3 mg/dL (ref 0.3–1.2)

## 2014-09-02 MED ORDER — OSELTAMIVIR PHOSPHATE 75 MG PO CAPS: 75.0000 mg | ORAL_CAPSULE | Freq: Two times a day (BID) | ORAL | Status: DC

## 2014-09-02 MED ORDER — GUAIFENESIN-CODEINE 100-10 MG/5ML PO SOLN
5.0000 mL | Freq: Once | ORAL | Status: AC
  Administered 2014-09-02: 5 mL via ORAL
  Filled 2014-09-02: qty 5

## 2014-09-02 MED ORDER — GUAIFENESIN-CODEINE 100-10 MG/5ML PO SOLN: 10.0000 mL | Freq: Four times a day (QID) | ORAL | Status: DC | PRN

## 2014-09-02 MED ORDER — IBUPROFEN 800 MG PO TABS: 800.0000 mg | ORAL_TABLET | Freq: Three times a day (TID) | ORAL | Status: DC | PRN

## 2014-09-02 MED ORDER — KETOROLAC TROMETHAMINE 30 MG/ML IJ SOLN
30.0000 mg | Freq: Once | INTRAMUSCULAR | Status: AC
  Administered 2014-09-02: 30 mg via INTRAVENOUS
  Filled 2014-09-02: qty 1

## 2014-09-02 MED ORDER — SODIUM CHLORIDE 0.9 % IV BOLUS (SEPSIS)
1000.0000 mL | Freq: Once | INTRAVENOUS | Status: AC
  Administered 2014-09-02: 1000 mL via INTRAVENOUS

## 2014-09-02 MED ORDER — ONDANSETRON 4 MG PO TBDP: 4.0000 mg | ORAL_TABLET | Freq: Three times a day (TID) | ORAL | Status: DC | PRN

## 2014-09-02 MED ORDER — ONDANSETRON HCL 4 MG/2ML IJ SOLN
4.0000 mg | Freq: Once | INTRAMUSCULAR | Status: AC
  Administered 2014-09-02: 4 mg via INTRAVENOUS
  Filled 2014-09-02: qty 2

## 2014-09-02 NOTE — ED Notes (Signed)
Patient reports having sharp chest pains along with productive cough, headache, and sore throat.  States she has been taking AlkaSeltzer flu medicine.  She decided to be evaluated when chest pains began earlier this morning.

## 2014-09-02 NOTE — ED Notes (Signed)
Pt states she started feeling bad yesterday evening and has progressively been getting worse  Pt is c/o headache, cough, chest pains, and diarrhea

## 2014-09-02 NOTE — ED Notes (Signed)
Patient transported to X-ray 

## 2014-09-02 NOTE — ED Provider Notes (Signed)
TIME SEEN: 5:45 AM  CHIEF COMPLAINT: Subjective fever, cough, sore throat, headache, chest pain  HPI: Pt is a 49 y.o. female with no significant past medical history who presents to the emergency department with nasal congestion, subjective fevers, dry cough, sore throat, headache, sharp chest pain, diarrhea that started yesterday. 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 Denies any vomiting. Denies any rash. Did not have an influenza vaccination last year. No recent travel. States she was concerned because of the chest pain that is why she came to the emergency department because she has a family history of CAD and is a smoker. Pain is described as sharp and central without radiation. No aggravating or relieving factors. Has been constant since 8p.m. yesterday. Denies shortness of breath, diaphoresis, vomiting, dizziness. Denies prior history of PE or DVT, recent prolonged immobilization such as long flight or hospitalization fracture, surgery, trauma, exogenous estrogen use. No calf tenderness or swelling.  ROS: See HPI Constitutional: Subjective fever  Eyes: no drainage  ENT: no runny nose   Cardiovascular:   chest pain  Resp: no SOB  GI: no vomiting GU: no dysuria Integumentary: no rash  Allergy: no hives  Musculoskeletal: no leg swelling  Neurological: no slurred speech ROS otherwise negative  PAST MEDICAL HISTORY/PAST SURGICAL HISTORY:  History reviewed. No pertinent past medical history.  MEDICATIONS:  Prior to Admission medications   Medication Sig Start Date End Date Taking? Authorizing Provider  Chlorphen-Phenyleph-ASA (ALKA-SELTZER PLUS COLD PO) Take 30 mLs by mouth as needed (cough).   Yes Historical Provider, MD  ibuprofen (ADVIL,MOTRIN) 200 MG tablet Take 800 mg by mouth every 6 (six) hours as needed for mild pain.   Yes Historical Provider, MD  benzonatate (TESSALON) 100 MG capsule Take 1 capsule (100 mg total) by mouth every 8  (eight) hours. Patient not taking: Reported on 09/02/2014 06/24/13   Domenic Moras, PA-C  ibuprofen (ADVIL,MOTRIN) 800 MG tablet Take 1 tablet (800 mg total) by mouth every 6 (six) hours as needed for moderate pain. Patient not taking: Reported on 09/02/2014 07/01/13   Ignacia Felling, PA-C  levofloxacin (LEVAQUIN) 750 MG tablet Take 1 tablet (750 mg total) by mouth daily. X 7 days Patient not taking: Reported on 09/02/2014 06/24/13   Domenic Moras, PA-C    ALLERGIES:  Allergies  Allergen Reactions  . Hydrocodone Itching    SOCIAL HISTORY:  History  Substance Use Topics  . Smoking status: Current Every Day Smoker -- 0.50 packs/day  . Smokeless tobacco: Not on file  . Alcohol Use: No    FAMILY HISTORY: Family History  Problem Relation Age of Onset  . Hypertension Other   . Diabetes Other     EXAM: BP 157/91 mmHg  Pulse 96  Temp(Src) 98.1 F (36.7 C) (Oral)  Resp 22  Ht 5' 4.5" (1.638 m)  Wt 160 lb (72.576 kg)  BMI 27.05 kg/m2  SpO2 100% CONSTITUTIONAL: Alert and oriented and responds appropriately to questions. Well-appearing; well-nourished; appears uncomfortable but is nontoxic appearing HEAD: Normocephalic EYES: Conjunctivae clear, PERRL ENT: normal nose; no rhinorrhea; moist mucous membranes; pharynx without lesions noted, no tonsillar hypertrophy or exudate, no uvular deviation, normal phonation, no stridor, no trismus or drooling NECK: Supple, no meningismus, no LAD  CARD: RRR; S1 and S2 appreciated; no murmurs, no clicks, no rubs, no gallops RESP: Normal chest excursion without splinting or tachypnea; breath sounds clear and equal bilaterally; no wheezes, no rhonchi, no rales, no hypoxia  or respiratory distress, speaking full sentences ABD/GI: Normal bowel sounds; non-distended; soft, non-tender, no rebound, no guarding BACK:  The back appears normal and is non-tender to palpation, there is no CVA tenderness EXT: Normal ROM in all joints; non-tender to palpation; no edema;  normal capillary refill; no cyanosis; no calf tenderness or swelling    SKIN: Normal color for age and race; warm; no rash NEURO: Moves all extremities equally PSYCH: The patient's mood and manner are appropriate. Grooming and personal hygiene are appropriate.  MEDICAL DECISION MAKING: Patient here with flulike symptoms. Suspect viral illness. Chest pain is very atypical. She does have family history of CAD and a history of tobacco use. Will check EKG, troponin, chest x-ray. We'll treat symptomatically with IV fluids, Zofran, Toradol, guaifenesin with codeine.  ED PROGRESS: Patient's workup has been unremarkable other than mild leukocytosis without left shift. Troponin negative. Chest x-ray clear. Patient reports feeling better. We'll discharge home. We'll provide work note. We'll discharge with prescriptions for Zofran, Tamiflu, ibuprofen, guaifenesin with codeine. Discussed return precautions. She verbalized understanding and is comfortable with plan.     EKG Interpretation  Date/Time:  Thursday September 02 2014 05:31:48 EDT Ventricular Rate:  92 PR Interval:  177 QRS Duration: 62 QT Interval:  346 QTC Calculation: 428 R Axis:   51 Text Interpretation:  Sinus rhythm Atrial premature complex Low voltage, precordial leads No significant change since last tracing Confirmed by Rande Dario,  DO, Shalese Strahan [phone]) on 09/02/2014 5:38:23 AM           [place], DO 09/02/14 0947

## 2014-09-02 NOTE — Discharge Instructions (Signed)
Possible Influenza Influenza ("the flu") is a viral infection of the respiratory tract. It occurs more often in winter months because people spend more time in close contact with one another. Influenza can make you feel very sick. Influenza easily spreads from person to person (contagious). CAUSES  Influenza is caused by a virus that infects the respiratory tract. You can catch the virus by breathing in droplets from an infected person's cough or sneeze. You can also catch the virus by touching something that was recently contaminated with the virus and then touching your mouth, nose, or eyes. RISKS AND COMPLICATIONS You may be at risk for a more severe case of influenza if you smoke cigarettes, have diabetes, have chronic heart disease (such as heart failure) or lung disease (such as asthma), or if you have a weakened immune system. Elderly people and pregnant women are also at risk for more serious infections. The most common problem of influenza is a lung infection (pneumonia). Sometimes, this problem can require emergency medical care and may be life threatening. SIGNS AND SYMPTOMS  Symptoms typically last 4 to 10 days and may include:  Fever.  Chills.  Headache, body aches, and muscle aches.  Sore throat.  Chest discomfort and cough.  Poor appetite.  Weakness or feeling tired.  Dizziness.  Nausea or vomiting. DIAGNOSIS  Diagnosis of influenza is often made based on your history and a physical exam. A nose or throat swab test can be done to confirm the diagnosis. TREATMENT  In mild cases, influenza goes away on its own. Treatment is directed at relieving symptoms. For more severe cases, your health care provider may prescribe antiviral medicines to shorten the sickness. Antibiotic medicines are not effective because the infection is caused by a virus, not by bacteria. HOME CARE INSTRUCTIONS  Take medicines only as directed by your health care provider.  Use a cool mist  humidifier to make breathing easier.  Get plenty of rest until your temperature returns to normal. This usually takes 3 to 4 days.  Drink enough fluid to keep your urine clear or pale yellow.  Cover yourmouth and nosewhen coughing or sneezing,and wash your handswellto prevent thevirusfrom spreading.  Stay homefromwork orschool untilthe fever is gonefor at least 71full day. PREVENTION  An annual influenza vaccination (flu shot) is the best way to avoid getting influenza. An annual flu shot is now routinely recommended for all adults in the [place] IF:  You experiencechest pain, yourcough worsens,or you producemore mucus.  Youhave nausea,vomiting, ordiarrhea.  Your fever returns or gets worse. SEEK IMMEDIATE MEDICAL CARE IF:  You havetrouble breathing, you become short of breath,or your skin ornails becomebluish.  You have severe painor stiffnessin the neck.  You develop a sudden headache, or pain in the face or ear.  You have nausea or vomiting that you cannot control. MAKE SURE YOU:   Understand these instructions.  Will watch your condition.  Will get help right away if you are not doing well or get worse. Document Released: 05/25/2000 Document Revised: 10/12/2013 Document Reviewed: 08/27/2011 [facility] Patient Information 2015 [place], [place]. This information is not intended to replace advice given to you by your health care provider. Make sure you discuss any questions you have with your health care provider.

## 2014-09-02 NOTE — ED Notes (Signed)
Returned from xray

## 2015-01-28 ENCOUNTER — Encounter (HOSPITAL_COMMUNITY): Payer: Self-pay | Admitting: *Deleted

## 2015-01-28 ENCOUNTER — Emergency Department (HOSPITAL_COMMUNITY)
Admission: EM | Admit: 2015-01-28 | Discharge: 2015-01-28 | Disposition: A | Discharge status: Home / Self Care | Payer: BLUE CROSS/BLUE SHIELD | Attending: Emergency Medicine | Admitting: Emergency Medicine

## 2015-01-28 DIAGNOSIS — H538 Other visual disturbances: Secondary | ICD-10-CM

## 2015-01-28 DIAGNOSIS — R112 Nausea with vomiting, unspecified: Secondary | ICD-10-CM | POA: Diagnosis not present

## 2015-01-28 DIAGNOSIS — H527 Unspecified disorder of refraction: Secondary | ICD-10-CM

## 2015-01-28 DIAGNOSIS — R109 Unspecified abdominal pain: Secondary | ICD-10-CM | POA: Diagnosis present

## 2015-01-28 DIAGNOSIS — Z90710 Acquired absence of both cervix and uterus: Secondary | ICD-10-CM | POA: Diagnosis not present

## 2015-01-28 DIAGNOSIS — K644 Residual hemorrhoidal skin tags: Secondary | ICD-10-CM | POA: Insufficient documentation

## 2015-01-28 DIAGNOSIS — Z3202 Encounter for pregnancy test, result negative: Secondary | ICD-10-CM | POA: Diagnosis not present

## 2015-01-28 DIAGNOSIS — Z720 Tobacco use: Secondary | ICD-10-CM | POA: Insufficient documentation

## 2015-01-28 LAB — CBG MONITORING, ED: GLUCOSE-CAPILLARY: 74 mg/dL (ref 65–99)

## 2015-01-28 LAB — CBC
HCT: 41.2 % (ref 36.0–46.0)
Hemoglobin: 14.2 g/dL (ref 12.0–15.0)
MCH: 30 pg (ref 26.0–34.0)
MCHC: 34.5 g/dL (ref 30.0–36.0)
MCV: 86.9 fL (ref 78.0–100.0)
PLATELETS: 315 10*3/uL (ref 150–400)
RBC: 4.74 MIL/uL (ref 3.87–5.11)
RDW: 12.7 % (ref 11.5–15.5)
WBC: 7.6 10*3/uL (ref 4.0–10.5)

## 2015-01-28 LAB — URINALYSIS, ROUTINE W REFLEX MICROSCOPIC
Bilirubin Urine: NEGATIVE
Glucose, UA: NEGATIVE mg/dL
Hgb urine dipstick: NEGATIVE
KETONES UR: NEGATIVE mg/dL
NITRITE: NEGATIVE
Protein, ur: NEGATIVE mg/dL
Specific Gravity, Urine: 1.016 (ref 1.005–1.030)
Urobilinogen, UA: 1 mg/dL (ref 0.0–1.0)
pH: 6.5 (ref 5.0–8.0)

## 2015-01-28 LAB — URINE MICROSCOPIC-ADD ON

## 2015-01-28 LAB — COMPREHENSIVE METABOLIC PANEL
ALT: 21 U/L (ref 14–54)
ANION GAP: 7 (ref 5–15)
AST: 25 U/L (ref 15–41)
Albumin: 3.9 g/dL (ref 3.5–5.0)
Alkaline Phosphatase: 76 U/L (ref 38–126)
BUN: 6 mg/dL (ref 6–20)
CHLORIDE: 109 mmol/L (ref 101–111)
CO2: 25 mmol/L (ref 22–32)
Calcium: 9.4 mg/dL (ref 8.9–10.3)
Creatinine, Ser: 0.79 mg/dL (ref 0.44–1.00)
Glucose, Bld: 95 mg/dL (ref 65–99)
POTASSIUM: 4.2 mmol/L (ref 3.5–5.1)
Sodium: 141 mmol/L (ref 135–145)
Total Bilirubin: 0.9 mg/dL (ref 0.3–1.2)
Total Protein: 6.9 g/dL (ref 6.5–8.1)

## 2015-01-28 LAB — LIPASE, BLOOD: LIPASE: 22 U/L (ref 22–51)

## 2015-01-28 LAB — POC URINE PREG, ED: Preg Test, Ur: NEGATIVE

## 2015-01-28 MED ORDER — ONDANSETRON HCL 4 MG PO TABS: 4.0000 mg | ORAL_TABLET | Freq: Three times a day (TID) | ORAL | Status: DC | PRN

## 2015-01-28 MED ORDER — DICYCLOMINE HCL 20 MG PO TABS: 20.0000 mg | ORAL_TABLET | Freq: Four times a day (QID) | ORAL | Status: DC | PRN

## 2015-01-28 MED ORDER — ONDANSETRON 8 MG PO TBDP
8.0000 mg | ORAL_TABLET | Freq: Once | ORAL | Status: AC
  Administered 2015-01-28: 8 mg via ORAL
  Filled 2015-01-28: qty 1

## 2015-01-28 MED ORDER — DICYCLOMINE HCL 10 MG/ML IM SOLN
20.0000 mg | Freq: Once | INTRAMUSCULAR | Status: AC
  Administered 2015-01-28: 20 mg via INTRAMUSCULAR
  Filled 2015-01-28: qty 2

## 2015-01-28 NOTE — ED Notes (Signed)
Pt states she is able to see through pin hole in paper bilaterally while covering one eye. Pt states she is still having a little blurriness in her vision, especially to objects far away. MD aware.

## 2015-01-28 NOTE — ED Provider Notes (Signed)
CSN: [id number]     Arrival date & time 01/28/15  1256 History   First MD Initiated Contact with Patient 01/28/15 1401     Chief Complaint  Patient presents with  . Abdominal Pain  . Emesis  . Dizziness      HPI Pt was seen at 1415. Per pt, c/o gradual onset and persistence of multiple intermittent episodes of N/V that began 4 days ago. Has been associated with generalized "cramping" abd pain. Pt also states she has seen "blood on the toilet paper" when she wipes after having a BM. Pt also c/o "blurry vision" for the past 2+ weeks. Denies headache, no focal motor weakness, no tingling/numbness in extremities, CP/SOB, no back pain, no fevers, no black or blood in emesis, no black or bloody stools.     History reviewed. No pertinent past medical history.   Past Surgical History  Procedure Laterality Date  . Cesarean section    . Abdominal hysterectomy     Family History  Problem Relation Age of Onset  . Hypertension Other   . Diabetes Other    Social History  Substance Use Topics  . Smoking status: Current Every Day Smoker -- 1.00 packs/day for 20 years    Types: Cigarettes  . Smokeless tobacco: None  . Alcohol Use: No    Review of Systems ROS: Statement: All systems negative except as marked or noted in the HPI; Constitutional: Negative for fever and chills. ; ; Eyes: Negative for eye pain, redness and discharge. ; ; ENMT: Negative for ear pain, hoarseness, nasal congestion, sinus pressure and sore throat. ; ; Cardiovascular: Negative for chest pain, palpitations, diaphoresis, dyspnea and peripheral edema. ; ; Respiratory: Negative for cough, wheezing and stridor. ; ; Gastrointestinal: Negative for diarrhea, blood in stool, hematemesis, jaundice and +N/V, abd pain, rectal bleeding. . ; ; Genitourinary: Negative for flank pain and hematuria. ; ; Musculoskeletal: Negative for back pain and neck pain. Negative for swelling and trauma.; ; Skin: Negative for pruritus, rash, abrasions,  blisters, bruising and skin lesion.; ; Neuro: Negative for headache, lightheadedness and neck stiffness. Negative for weakness, altered level of consciousness , altered mental status, extremity weakness, paresthesias, involuntary movement, seizure and syncope.     Allergies  Hydrocodone  Home Medications   Prior to Admission medications   Medication Sig Start Date End Date Taking? Authorizing Provider  ibuprofen (ADVIL,MOTRIN) 200 MG tablet Take 600-800 mg by mouth every 4 (four) hours as needed for moderate pain.   Yes Historical Provider, MD  guaiFENesin-codeine 100-10 MG/5ML syrup Take 10 mLs by mouth every 6 (six) hours as needed for cough. Patient not taking: Reported on 01/28/2015 09/02/14   Kristen N Ward, DO  ibuprofen (ADVIL,MOTRIN) 800 MG tablet Take 1 tablet (800 mg total) by mouth every 8 (eight) hours as needed for mild pain. Patient not taking: Reported on 01/28/2015 09/02/14   Kristen N Ward, DO  ondansetron (ZOFRAN ODT) 4 MG disintegrating tablet Take 1 tablet (4 mg total) by mouth every 8 (eight) hours as needed for nausea or vomiting. Patient not taking: Reported on 01/28/2015 09/02/14   Delice Bison Ward, DO  oseltamivir (TAMIFLU) 75 MG capsule Take 1 capsule (75 mg total) by mouth every 12 (twelve) hours. Patient not taking: Reported on 01/28/2015 09/02/14   Kristen N Ward, DO   BP 160/87 mmHg  Pulse 73  Temp(Src) 97.9 F (36.6 C) (Oral)  Resp 14  SpO2 100%   14:17 Orthostatic Vital Signs HR  Orthostatic  Lying  - BP- Lying: 155/89 mmHg ; Pulse- Lying: 69  Orthostatic Sitting - BP- Sitting: 165/89 mmHg ; Pulse- Sitting: 81  Orthostatic Standing at 0 minutes - BP- Standing at 0 minutes: 145/97 mmHg ; Pulse- Standing at 0 minutes: 91     14:34:55 Visual Acuity AM  Visual Acuity - Bilateral Distance: 20/40 ; R Distance: 20/40 ; L Distance: 20/40      Physical Exam 1420: Physical examination:  Nursing notes reviewed; Vital signs and O2 SAT reviewed;  Constitutional: Well  developed, Well nourished, Well hydrated, In no acute distress; Head:  Normocephalic, atraumatic; Eyes: EOMI, PERRL, No scleral icterus; ENMT: Mouth and pharynx normal, Mucous membranes moist; Neck: Supple, Full range of motion, No lymphadenopathy; Cardiovascular: Regular rate and rhythm, No murmur, rub, or gallop; Respiratory: Breath sounds clear & equal bilaterally, No rales, rhonchi, wheezes.  Speaking full sentences with ease, Normal respiratory effort/excursion; Chest: Nontender, Movement normal; Abdomen: Soft, Nontender, Nondistended, Normal bowel sounds. Rectal exam performed w/permission of pt and ED RN chaperone present.  Anal tone normal.  No fissures, +denuded skin on external hemorrhoid, no thrombosis. No palp masses.; Genitourinary: No CVA tenderness; Extremities: Pulses normal, No tenderness, No edema, No calf edema or asymmetry.; Neuro: AA&Ox3, Major CN grossly intact.  Speech clear. No gross focal motor or sensory deficits in extremities. Climbs on and off stretcher easily by herself. Gait steady.; Skin: Color normal, Warm, Dry.   ED Course  Procedures   Labs Review  Imaging Review  I have personally reviewed and evaluated these images and lab results as part of my medical decision-making.    MDM  MDM Reviewed: previous chart, nursing note and vitals Reviewed previous: labs Interpretation: labs     Results for orders placed or performed during the hospital encounter of 01/28/15  Lipase, blood  Result Value Ref Range   Lipase 22 22 - 51 U/L  Comprehensive metabolic panel  Result Value Ref Range   Sodium 141 135 - 145 mmol/L   Potassium 4.2 3.5 - 5.1 mmol/L   Chloride 109 101 - 111 mmol/L   CO2 25 22 - 32 mmol/L   Glucose, Bld 95 65 - 99 mg/dL   BUN 6 6 - 20 mg/dL   Creatinine, Ser 0.79 0.44 - 1.00 mg/dL   Calcium 9.4 8.9 - 10.3 mg/dL   Total Protein 6.9 6.5 - 8.1 g/dL   Albumin 3.9 3.5 - 5.0 g/dL   AST 25 15 - 41 U/L   ALT 21 14 - 54 U/L   Alkaline Phosphatase  76 38 - 126 U/L   Total Bilirubin 0.9 0.3 - 1.2 mg/dL   GFR calc non Af Amer >60 >60 mL/min   GFR calc Af Amer >60 >60 mL/min   Anion gap 7 5 - 15  CBC  Result Value Ref Range   WBC 7.6 4.0 - 10.5 K/uL   RBC 4.74 3.87 - 5.11 MIL/uL   Hemoglobin 14.2 12.0 - 15.0 g/dL   HCT 41.2 36.0 - 46.0 %   MCV 86.9 78.0 - 100.0 fL   MCH 30.0 26.0 - 34.0 pg   MCHC 34.5 30.0 - 36.0 g/dL   RDW 12.7 11.5 - 15.5 %   Platelets 315 150 - 400 K/uL  Urinalysis, Routine w reflex microscopic (not at [facility])  Result Value Ref Range   Color, Urine YELLOW YELLOW   APPearance CLOUDY (A) CLEAR   Specific Gravity, Urine 1.016 1.005 - 1.030   pH 6.5 5.0 - 8.0  Glucose, UA NEGATIVE NEGATIVE mg/dL   Hgb urine dipstick NEGATIVE NEGATIVE   Bilirubin Urine NEGATIVE NEGATIVE   Ketones, ur NEGATIVE NEGATIVE mg/dL   Protein, ur NEGATIVE NEGATIVE mg/dL   Urobilinogen, UA 1.0 0.0 - 1.0 mg/dL   Nitrite NEGATIVE NEGATIVE   Leukocytes, UA SMALL (A) NEGATIVE  Urine microscopic-add on  Result Value Ref Range   Squamous Epithelial / LPF RARE RARE   WBC, UA 11-20 <3 WBC/hpf   RBC / HPF 0-2 <3 RBC/hpf   Bacteria, UA FEW (A) RARE   Urine-Other MUCOUS PRESENT   POC urine preg, ED (not at [facility])  Result Value Ref Range   Preg Test, Ur NEGATIVE NEGATIVE  CBG monitoring, ED  Result Value Ref Range   Glucose-Capillary 74 65 - 99 mg/dL    1555:  Visual acuity performed as above: pt felt her vision was "blurry." Visual acuity repeated with pinhole: pt stated her vision was "better."  Appears refractive error; will need more extensive visual acuity evaluation by Eye MD; pt verb understanding. Pt has tol PO well while in the ED without N/V.  No stooling while in the ED.  Abd remains benign, VSS. Denies dysuria. Feels better and wants to go home now. Dx and testing d/w pt.  Questions answered.  Verb understanding, agreeable to d/c home with outpt f/u.      Francine Graven, DO 01/30/15 1432

## 2015-01-28 NOTE — ED Notes (Addendum)
Visual acuity completed. Pt stated "I just cant see". Pt reports vision worsening about a weak ago

## 2015-01-28 NOTE — Progress Notes (Addendum)
Pt confirms no pcp  CM provided a 5 page list of providers within the [phone] zip code for her TPV policy for bcbs  CM also confirm Dr Nickolas Madrid and [facility] are in network for pt bcbs plan and educated her about how to obtain or verify other providers

## 2015-01-28 NOTE — Discharge Instructions (Signed)
°Emergency Department Resource Guide °1) Find a Doctor and Pay Out of Pocket °Although you won't have to find out who is covered by your insurance plan, it is a good idea to ask around and get recommendations. You will then need to call the office and see if the doctor you have chosen will accept you as a new patient and what types of options they offer for patients who are self-pay. Some doctors offer discounts or will set up payment plans for their patients who do not have insurance, but you will need to ask so you aren't surprised when you get to your appointment. ° °2) Contact Your Local Health Department °Not all health departments have doctors that can see patients for sick visits, but many do, so it is worth a call to see if yours does. If you don't know where your local health department is, you can check in your phone book. The CDC also has a tool to help you locate your state's health department, and many state websites also have listings of all of their local health departments. ° °3) Find a Walk-in Clinic °If your illness is not likely to be very severe or complicated, you may want to try a walk in clinic. These are popping up all over the country in pharmacies, drugstores, and shopping centers. They're usually staffed by nurse practitioners or physician assistants that have been trained to treat common illnesses and complaints. They're usually fairly quick and inexpensive. However, if you have serious medical issues or chronic medical problems, these are probably not your best option. ° °No Primary Care Doctor: °- Call [facility] at  832-8000 - they can help you locate a primary care doctor that  accepts your insurance, provides certain services, etc. °- Physician Referral Service- 1-800-533-3463 ° °Chronic Pain Problems: °Organization         Address  Phone   Notes  ° [facility]  (336) 297-2271 Patients need to be referred by their primary care doctor.  ° °Medication  Assistance: °Organization         Address  Phone   Notes  °[place] [facility] [address] °[place], [place] [address] (336) 641-8030 --Must be a resident of [place] °-- Must have NO insurance coverage whatsoever (no Medicaid/ Medicare, etc.) °-- The pt. MUST have a primary care doctor that directs their care regularly and follows them in the community °  °[facility]  (866) 331-1348   °[employer]  (888) 892-1162   ° °Agencies that provide inexpensive medical care: °Organization         Address  Phone   Notes  °[facility]  (336) 832-8035   ° Internal Medicine    (336) 832-7272   °[facility] [address] °[place], [place] [address] (336) 832-4777   °[facility] [place] [address], °[place] (336) 271-4999   °[facility]    (336) 373-0678   °[facility]    (336) 272-1050   °[facility] ° [address], [place] Phone:  (336) 832-4444, Fax:  (336) 832-4440 Hours of Operation:  9 am - 6 pm, M-F.  Also accepts Medicaid/Medicare and self-pay.  °[facility] ° [address], [place] Phone: (336) 832-3150, Fax: (336) [phone]. Hours of Operation:  8:30 am - 5:30 pm, M-F.  Also accepts Medicaid and self-pay.  °[facility] [place] [address], [place] Phone: (336) 878-6027   °[facility] [address], [place], [place] (336)723-1848, Ext. 123 Mondays & Thursdays: 7-9 AM.  First 15 patients are seen on a first come, first serve basis. °  ° °Medicaid-accepting [place] Providers: ° °Organization         Address  Phone   Notes  °[facility] [address], [place] (336) 641-2100 Also accepts self-pay patients.  °[facility] [address], [place] ° (336) 856-9996   °[facility] [address], [place]  (336) 288-8857   °[facility] [address] [place] [address], [place] (336) 299-7000   °Veita Bland [address], [place]  ° (336) 373-1557 Only accepts [place] Access Medicaid patients after they have their name applied to their card.  ° °Self-Pay (no insurance) in [place]: ° °Organization         Address  Phone   Notes  °Sickle Cell Patients, [facility] [address], [place] (336) 832-1970   °[facility] [address], [place] (336) 832-4400   °[facility] ° [address], [place] (336) 992-4800   °[facility]/Dr. Osei-Bonsu ° [address] [place] [address], [place] or [address], [place] (336) 841-8500 Phone number for both [place] and [place] locations is the same.  °[facility] [address], [place] (336) 299-0000   °[facility] [address] [place] [address], [place] or [address] (336) 852-7530 °(336) 878-2260   °[facility] [address], [place] (336) 350-1642, phone; (336) 294-5005, fax Sees patients 1st and 3rd Saturday of every month.  Must not qualify for public or private insurance (i.e. Medicaid, Medicare, [place] Health Choice, Veterans' Benefits) • Household income should be no more than 200% of the poverty level •The clinic cannot treat you if you are pregnant or think you are pregnant • Sexually transmitted diseases are not treated at the clinic.  ° ° °Dental Care: °Organization         Address  Phone  Notes  °[place] [facility] [address], [place] (336) 641-6152 Accepts children up to age 21 who are enrolled in Medicaid or [place] Health Choice; pregnant women with a Medicaid card; and children who have applied for Medicaid or [place] Health Choice, but were declined, whose parents can pay a reduced fee at time of service.  °[place]  [facility] [place]  [address], [place] (336) 641-7733 Accepts children up to age 21 who are enrolled in Medicaid or [place] Health Choice; pregnant women with a Medicaid card; and children who have applied for Medicaid or [place] Health Choice, but were declined, whose parents can pay a reduced fee at time of service.  °[facility] ° [address], [place] (336) 641-4533 Patients are seen by appointment only. Walk-ins are not accepted. [facility] will see patients 18 years of age and older. °Monday - Tuesday (8am-5pm) °Most Wednesdays (8:30-5pm) °$30 per visit, cash only  °[facility] ° [address], [place] (336) 641-4533 Patients are seen by appointment only. Walk-ins are not accepted. [facility] will see patients 18 years of age and older. °One   Wednesday Evening (Monthly: Volunteer Based).  $30 per visit, cash only  °[facility]  (919) 537-3737 for adults; Children under age 4, call Graduate Pediatric Dentistry at ([phone]) [phone]. Children aged 4-14, please call ([phone]) [phone] to request a pediatric application. ° Dental services are provided in all areas of dental care including fillings, crowns and bridges, complete and partial dentures, implants, gum treatment, root canals, and extractions. Preventive care is also provided. Treatment is provided to both adults and children. °Patients are selected via a lottery and there is often a waiting list. °  °[facility] [address], °[place] ° (336) 763-8833 [email or web address] °  °[facility] [address], [place], [place] (336)723-1848, Ext. 123 Second and Fourth Thursday of each month, opens at 6:30 AM; Clinic ends at 9 AM.  Patients are seen on a first-come first-served basis, and a limited number are seen during each clinic.  ° °[facility] ° [address], [place], [place] (336) 723-7904    Eligibility Requirements °You must have lived in [place], [place], or [place] counties for at least the last three months. °  You cannot be eligible for state or federal sponsored healthcare insurance, including Veterans Administration, Medicaid, or Medicare. °  You generally cannot be eligible for healthcare insurance through your employer.  °  How to apply: °Eligibility screenings are held every Tuesday and Wednesday afternoon from 1:00 pm until 4:00 pm. You do not need an appointment for the interview!  °[facility] [address], [place], [place] 336-631-2330   °[facility]  336-342-8273   °[facility]  336-703-3100   °[facility]  336-570-6415   ° °Behavioral Health Resources in the Community: °Intensive Outpatient Programs °Organization         Address  Phone  Notes  °[facility] [address], [place], [place] 336-878-6098   °[facility] [address], [place], [place] 336-832-9800   °[facility] [address], [place], [place] ° 336-882-2125   °[facility] [address],  °[place], [place] 1-800-853-5163 or 336-641-4981   °Substance Abuse Resources °Organization         Address  Phone  Notes  °[facility]  336-882-2125   °[facility]  336-784-9470   °[facility]  336-285-9073   °[facility]  336-845-3988   °Residential & Outpatient Substance Abuse Program  1-800-659-3381   °Psychological Services °Organization         Address  Phone  Notes  °[facility]  336- 832-9600   °[facility]  336- 378-7881   °[facility] [address], [place] 1-800-853-5163 or 336-641-4981   ° °Mobile Crisis Teams °Organization         Address  Phone  Notes  °[facility], Mobile Crisis Care Unit  1-877-626-1772   °Assertive °[facility] ° [address]  [place], [place] 336-834-9664   °Sharon DeEsch [address] °[place] 336-554-5454   ° °Self-Help/Support Groups °Organization         Address  Phone             Notes  °[facility] [place] - variety of support groups  336- 373-1402 Call for more information  °Narcotics Anonymous (NA), [facility] [address], °[place]   2 meetings at this location  ° °  Residential Treatment Programs Organization         Address  Phone  Notes  [facility] [address]    [place]  1-707 653 3790   [facility]  [address] [place] [address], [place], [place]   [place], [place] 475-251-0287 Admissions: 8am-3pm M-F  [facility] [place] [address]    [place], [place] 295-188-4166   [facility] [address] [place], [place], [place]   [facility] [address]  [place], [place]   [facility] - Intensive Outpatient [address], [place], [place]   [facility] ([place]  [place], [place] 1-(639) 841-8825 or 434-072-4391   [facility] [address], [place], [place] Accepts Medicaid  [facility] [place] [address],  [place] 1-307-683-9087 Substance Abuse/Addiction Treatment   [facility] Organization         Address  Phone  Notes  [facility]  515-117-8453   Domenic Schwab, PhD [address] Arlis Porta [place], [place]   (253)482-9433 or 872-202-8358   [facility] [place] [place], [place] 737-003-1094   [facility] [address] [place], [place] [phone] Insurance/Medicaid/sponsorship through [facility] and Families [address]                                    [place], [place] (469)[phone] [place] [address], [place] (816)[phone]    Dr. Adele Schilder  [phone]   [facility] Dept. 1) [address] [place] 2) [place] 3)  [address], [place] [phone]  ([phone]   [place] 732-112-7144 or ([phone] (After Hours)      Take the prescriptions as directed.  Increase your fluid intake (ie:  Gatoraide) for the next few days.  Eat a bland diet and advance to your regular diet slowly as you can tolerate it.  Call your regular medical doctor today to schedule a follow up appointment within the next 3 days. Call the Eye doctor today to schedule a follow up appointment within the next week. Call the General Surgeon today to schedule a follow up appointment within the next week. Return to the Emergency Department immediately if not improving (or even worsening) despite taking the medicines as prescribed, any black or bloody stool or vomit, if you develop a fever over "101," or for any other concerns.

## 2015-01-28 NOTE — ED Notes (Signed)
Pt reports abd pain, vomiting, dizziness, and blurred vision x2 weeks. Pain 8/10. Reports blood on toilet paper after bowel movement. Pt ambulatory without assistance.

## 2015-02-28 ENCOUNTER — Emergency Department (HOSPITAL_COMMUNITY): Payer: BLUE CROSS/BLUE SHIELD

## 2015-02-28 ENCOUNTER — Encounter (HOSPITAL_COMMUNITY): Payer: Self-pay

## 2015-02-28 ENCOUNTER — Emergency Department (HOSPITAL_COMMUNITY)
Admission: EM | Admit: 2015-02-28 | Discharge: 2015-02-28 | Disposition: A | Discharge status: Home / Self Care | Payer: BLUE CROSS/BLUE SHIELD | Attending: Emergency Medicine | Admitting: Emergency Medicine

## 2015-02-28 DIAGNOSIS — R197 Diarrhea, unspecified: Secondary | ICD-10-CM | POA: Insufficient documentation

## 2015-02-28 DIAGNOSIS — M791 Myalgia: Secondary | ICD-10-CM | POA: Insufficient documentation

## 2015-02-28 DIAGNOSIS — R079 Chest pain, unspecified: Secondary | ICD-10-CM | POA: Insufficient documentation

## 2015-02-28 DIAGNOSIS — Z720 Tobacco use: Secondary | ICD-10-CM | POA: Diagnosis not present

## 2015-02-28 DIAGNOSIS — R42 Dizziness and giddiness: Secondary | ICD-10-CM | POA: Insufficient documentation

## 2015-02-28 DIAGNOSIS — R112 Nausea with vomiting, unspecified: Secondary | ICD-10-CM | POA: Diagnosis not present

## 2015-02-28 DIAGNOSIS — R05 Cough: Secondary | ICD-10-CM | POA: Insufficient documentation

## 2015-02-28 LAB — COMPREHENSIVE METABOLIC PANEL
ALT: 21 U/L (ref 14–54)
AST: 25 U/L (ref 15–41)
Albumin: 4.1 g/dL (ref 3.5–5.0)
Alkaline Phosphatase: 81 U/L (ref 38–126)
Anion gap: 7 (ref 5–15)
BUN: 10 mg/dL (ref 6–20)
CALCIUM: 9.8 mg/dL (ref 8.9–10.3)
CHLORIDE: 106 mmol/L (ref 101–111)
CO2: 24 mmol/L (ref 22–32)
Creatinine, Ser: 0.67 mg/dL (ref 0.44–1.00)
GFR calc non Af Amer: >60 mL/min (ref >60)
Glucose, Bld: 100 mg/dL — ABNORMAL HIGH (ref 65–99)
Potassium: 4.8 mmol/L (ref 3.5–5.1)
SODIUM: 137 mmol/L (ref 135–145)
Total Bilirubin: 0.7 mg/dL (ref 0.3–1.2)
Total Protein: 7.2 g/dL (ref 6.5–8.1)

## 2015-02-28 LAB — CBC WITH DIFFERENTIAL/PLATELET
BASOS PCT: 0 %
Basophils Absolute: 0 10*3/uL (ref 0.0–0.1)
EOS ABS: 0.1 10*3/uL (ref 0.0–0.7)
Eosinophils Relative: 1 %
HEMATOCRIT: 42.9 % (ref 36.0–46.0)
HEMOGLOBIN: 14.8 g/dL (ref 12.0–15.0)
LYMPHS ABS: 1.3 10*3/uL (ref 0.7–4.0)
Lymphocytes Relative: 14 %
MCH: 30.3 pg (ref 26.0–34.0)
MCHC: 34.5 g/dL (ref 30.0–36.0)
MCV: 87.9 fL (ref 78.0–100.0)
Monocytes Absolute: 0.5 10*3/uL (ref 0.1–1.0)
Monocytes Relative: 5 %
NEUTROS PCT: 80 %
Neutro Abs: 8 10*3/uL — ABNORMAL HIGH (ref 1.7–7.7)
Platelets: 271 10*3/uL (ref 150–400)
RBC: 4.88 MIL/uL (ref 3.87–5.11)
RDW: 12.9 % (ref 11.5–15.5)
WBC: 9.8 10*3/uL (ref 4.0–10.5)

## 2015-02-28 MED ORDER — ONDANSETRON HCL 4 MG/2ML IJ SOLN
4.0000 mg | Freq: Once | INTRAMUSCULAR | Status: AC
  Administered 2015-02-28: 4 mg via INTRAVENOUS
  Filled 2015-02-28: qty 2

## 2015-02-28 MED ORDER — ONDANSETRON HCL 8 MG PO TABS: 8.0000 mg | ORAL_TABLET | Freq: Three times a day (TID) | ORAL | Status: DC | PRN

## 2015-02-28 MED ORDER — ALBUTEROL SULFATE HFA 108 (90 BASE) MCG/ACT IN AERS
2.0000 | INHALATION_SPRAY | RESPIRATORY_TRACT | Status: DC | PRN
  Administered 2015-02-28: 2 via RESPIRATORY_TRACT
  Filled 2015-02-28: qty 6.7

## 2015-02-28 MED ORDER — ACETAMINOPHEN 500 MG PO TABS
1000.0000 mg | ORAL_TABLET | Freq: Once | ORAL | Status: AC
  Administered 2015-02-28: 1000 mg via ORAL
  Filled 2015-02-28: qty 2

## 2015-02-28 MED ORDER — SODIUM CHLORIDE 0.9 % IV BOLUS (SEPSIS)
2000.0000 mL | Freq: Once | INTRAVENOUS | Status: AC
  Administered 2015-02-28: 2000 mL via INTRAVENOUS

## 2015-02-28 MED ORDER — AEROCHAMBER PLUS W/MASK MISC
1.0000 | Freq: Once | Status: DC
  Filled 2015-02-28: qty 1

## 2015-02-28 NOTE — Discharge Instructions (Signed)
Avoid milk or foods containing milk such as cheese or ice cream while having diarrhea. You can take Imodium as directed for diarrhea Make sure that you drink at least six 8 ounce glasses of water or Gatorade each day so that you can stay well hydrated. Call the [facility] in [place] or any of the numbers on the resource guide today to get a primary care physician, and to arrange to be seen if you not feeling better in a week. Your blood pressure should be rechecked within the next 3 weeks. Today's was elevated at 151/87. Ask your new doctor to help you to stop smoking. Use your inhaler 2 puffs every 4 hours as needed for cough or shortness of breath.  Emergency Department Resource Guide 1) Find a Doctor and Pay Out of Pocket Although you won't have to find out who is covered by your insurance plan, it is a good idea to ask around and get recommendations. You will then need to call the office and see if the doctor you have chosen will accept you as a new patient and what types of options they offer for patients who are self-pay. Some doctors offer discounts or will set up payment plans for their patients who do not have insurance, but you will need to ask so you aren't surprised when you get to your appointment.  2) Contact Your Local Health Department Not all health departments have doctors that can see patients for sick visits, but many do, so it is worth a call to see if yours does. If you don't know where your local health department is, you can check in your phone book. The CDC also has a tool to help you locate your state's health department, and many state websites also have listings of all of their local health departments.  3) Find a [facility] If your illness is not likely to be very severe or complicated, you may want to try a walk in clinic. These are popping up all over the country in pharmacies, drugstores, and shopping centers. They're usually staffed by nurse  practitioners or physician assistants that have been trained to treat common illnesses and complaints. They're usually fairly quick and inexpensive. However, if you have serious medical issues or chronic medical problems, these are probably not your best option.  No Primary Care Doctor: - Call Health Connect at  [phone] - they can help you locate a primary care doctor that  accepts your insurance, provides certain services, etc. - Physician Referral Service- [phone]  Chronic Pain Problems: Organization         Address  Phone   Notes  [facility]  [phone] Patients need to be referred by their primary care doctor.   Medication Assistance: Organization         Address  Phone   Notes  [facility] Medication [facility] [address] [place], [place] [address] [phone] --Must be a resident of [facility] -- Must have NO insurance coverage whatsoever (no Medicaid/ Medicare, etc.) -- The pt. MUST have a primary care doctor that directs their care regularly and follows them in the community   Med[facility]ist  [phone]   [employer]  [phone]    Agencies that provide inexpensive medical care: Organization         Address  Phone   Notes  [place]  [phone]   [facility]    (  [phone]   [facility] [place], [place] [address] [phone]   [place] [address] [place] [phone]   Planned Parenthood    [phone]   [facility]    [phone]   [place] and [place] [address], [place] Phone:  [phone], Fax:  [phone] Hours of Operation:  9 am - 6 pm, M-F.  Also accepts Medicaid/Medicare and self-pay.  [facility] for [place], [address], [place] Phone: [phone], Fax: [phone]. Hours of Operation:  8:30 am -  5:30 pm, M-F.  Also accepts Medicaid and self-pay.  [facility] [place] [address], [place] Phone: [phone]   [place], [place], [place] [phone], Ext. [phone] Mondays & Thursdays: 7-9 AM.  First 15 patients are seen on a first come, first serve basis.    [place] Providers:  Organization         Address  Phone   Notes  [facility] [address], [place] [phone] Also accepts self-pay patients.  [facility] [address], [place]  [phone]   [place], [address], [place] [phone]   [facility] [address] [place] [phone]   Lucianne Lei [address], [place]   [phone] Only accepts [place] Access [place] patients after they have their name applied to their card.   Self-Pay (no insurance) in [facility]:  Organization         Address  Phone   Notes  Sickle Cell Patients, [facility] [place] [phone]   [facility] [place] [phone]   [facility] [place]  [place], [place], [place] [phone]   [facility]/Dr. Osei-Bonsu  [address] [place] or [address], [place] [phone] Phone number for both [place] and [place] locations is the same.  [facility]gent [facility] and [facility] [address], [place] [phone]   [facility] [address] [place] or [address] [phone]   [facility] [address], [place] [phone], phone; [phone], fax Sees patients 1st and 3rd Saturday of every month.  Must not qualify for public or private insurance (i.e. Medicaid, Medicare, [place] Health Choice,  Veterans' Benefits)  Household income should be no more than 200% of the poverty level The clinic cannot treat you if you are pregnant or think you are pregnant  Sexually transmitted diseases are not treated at the clinic.    Dental Care: Organization         Address  Phone  Notes  [facility] Department of [facility] [place] [phone] Accepts children up to age 52 who are enrolled in [place] or [place]; pregnant women with a Medicaid card; and children who have applied for Medicaid or [place] Health Choice, but were declined, whose parents can pay a reduced fee at time of service.  [facility] Department of [facility]  [address], [place] [phone] Accepts children up  to age 60 who are enrolled in Medicaid or [place] Health Choice; pregnant women with a Medicaid card; and children who have applied for Medicaid or [place] Health Choice, but were declined, whose parents can pay a reduced fee at time of service.  [place] Adult Dental Access PROGRAM  [place] [phone] Patients are seen by appointment only. Walk-ins are not accepted. [place] will see patients 9 years of age and older. Monday - Tuesday (8am-5pm) Most Wednesdays (8:30-5pm) $30 per visit, cash only  [facility] Adult Dental Access PROGRAM  [address], [facility] [phone] Patients are seen by appointment only. Walk-ins are not accepted. [place] will see patients 37 years of age and older. One Wednesday Evening (Monthly: Volunteer Based).  $30 per visit, cash only  [place]  [phone] for adults; Children under age 16, call Graduate Pediatric Dentistry at [phone]. Children aged 86-14, please call [phone] to request a pediatric application.  Dental services are provided in all areas of dental care including fillings, crowns and bridges, complete and  partial dentures, implants, gum treatment, root canals, and extractions. Preventive care is also provided. Treatment is provided to both adults and children. Patients are selected via a lottery and there is often a waiting list.   [facility] [address] [place]  [phone] [email or web address]   [facility] [address] [place], [place] [phone], Ext. [phone] Second and Fourth Thursday of each month, opens at 6:30 AM; Clinic ends at 9 AM.  Patients are seen on a first-come first-served basis, and a limited number are seen during each clinic.   [facility]  [address] Hillard Danker [place], [place] [phone]   Eligibility Requirements You must have lived in [place], [place], or [place] counties for at least the last three months.   You cannot be eligible for state or federal sponsored [employer], including [employer], [place], or [employer].   You generally cannot be eligible for healthcare insurance through your employer.    How to apply: Eligibility screenings are held every Tuesday and Wednesday afternoon from 1:00 pm until 4:00 pm. You do not need an appointment for the interview!  [facility] [address] [place], [place]   [place]  [place] Department  [place]  [phone]    Behavioral Health Resources in the Community: Intensive Outpatient Programs Organization         Address  Phone  Notes  [place]. [address] [place], [place] [phone]   [facility] [address], [place], [place]   ADS: [facility] Drug S[facility] [address] [place], [place]   [place] [address],  [place], [place] or [phone]   Substance Abuse Resources Organization          Address  Phone  Notes  Alcohol and Drug Services  [phone]   [place]  [phone]   The Gay   Chinita Pester  [phone]   Residential & Outpatient Substance Abuse Program  [phone]   Psychological Services Organization         Address  Phone  Notes  [place]  [place]  [place]   [place]  [address] [place] or [phone]    Mobile Crisis Teams Organization         Address  Phone  Notes  [facility], Mobile Crisis Care Unit  [phone]   [facility] Psychotherapeutic Services  [address] [place], [place]   [place] [address], [place] [phone]    Self-Help/Support Groups Organization         Address  Phone             Notes  [place]. of [place] - variety of support groups  [place] Call for more information  Narcotics Anonymous (NA), [facility] [address], [employer] [place]  2 meetings at this location   Special educational needs teacher         Address  Phone  Notes  [facility]idential [facility] [place],    [place]  [phone]   [facility]  [address] [place] [phone], [place], [place]   [place], [place] [phone] Admissions: 8am-3pm M-F  Incentives Substance [place] [address],    [place], [place] [phone]   [facility] [address] [place], [place], [place]   [facility] [address],  [place], [place]   [facility]ight Programs - Intensive Outpatient [address], [place], [place]   [facility] [address]  [place], [place] [phone] or [phone]   [facility] Services (RTS) [address], [place], [place] Accepts Medicaid  [place] [address],  [place] [phone] Substance Abuse/Addiction Treatment   [facility] Organization         Address  Phone  Notes  [facility]  [phone]   Domenic Schwab, PhD [address] Arlis Porta [place], [place]   [phone] or [phone]   [place], [place] [phone]   [facility] [address], [place], [place] [phone] Insurance/Medicaid/sponsorship through [facility] and Families [address], [address] [place]                                    [place], [place] [phone] [place] [address], [place] [phone]    Dr. Adele Schilder  [phone]   [facility] Dept. 1) [address] [place] 2) [place] 3)  [place], [place] [phone]  [phone]   [place] [phone] or [phone] (After Hours)

## 2015-02-28 NOTE — ED Provider Notes (Addendum)
CSN: [id number]     Arrival date & time 02/28/15  0751 History   First MD Initiated Contact with Patient 02/28/15 0802     Chief Complaint  Patient presents with  . Chest Pain  . Diarrhea     (Consider location/radiation/quality/duration/timing/severity/associated sxs/prior Treatment) HPI Complains of cough vomiting diarrhea for the past 2 weeks. Associated symptoms include lightheadedness. Patient reports she had 4 episodes of vomiting and 4 episodes of diarrhea in the past 24 hours. Chest pain is worse with coughing. Cough is nonproductive. She's been treated in the past with cough medicine, without relief she's also been treated with Zofran with relief of nausea, which she's run out of. Other associated symptoms include diffuse myalgias, and chest pain worse with coughing denies shortness of breath No other associated symptoms. Nothing makes symptoms better or worse History reviewed. No pertinent past medical history. Past Surgical History  Procedure Laterality Date  . Cesarean section    . Abdominal hysterectomy     Family History  Problem Relation Age of Onset  . Hypertension Other   . Diabetes Other    Social History  Substance Use Topics  . Smoking status: Current Every Day Smoker -- 1.00 packs/day for 20 years    Types: Cigarettes  . Smokeless tobacco: None  . Alcohol Use: No   OB History    No data available     Review of Systems  Constitutional: Negative.   HENT: Negative.   Respiratory: Positive for cough.   Cardiovascular: Positive for chest pain.  Gastrointestinal: Positive for vomiting and diarrhea.  Musculoskeletal: Positive for myalgias.  Skin: Negative.   Neurological: Negative.   Psychiatric/Behavioral: Negative.   All other systems reviewed and are negative.     Allergies  Hydrocodone  Home Medications   Prior to Admission medications   Medication Sig Start Date End Date Taking? Authorizing Provider  dicyclomine (BENTYL) 20 MG tablet Take 1  tablet (20 mg total) by mouth every 6 (six) hours as needed for spasms (abdominal cramping). 01/28/15   Francine Graven, DO  guaiFENesin-codeine 100-10 MG/5ML syrup Take 10 mLs by mouth every 6 (six) hours as needed for cough. Patient not taking: Reported on 01/28/2015 09/02/14   Kristen N Ward, DO  ibuprofen (ADVIL,MOTRIN) 200 MG tablet Take 600-800 mg by mouth every 4 (four) hours as needed for moderate pain.    Historical Provider, MD  ibuprofen (ADVIL,MOTRIN) 800 MG tablet Take 1 tablet (800 mg total) by mouth every 8 (eight) hours as needed for mild pain. Patient not taking: Reported on 01/28/2015 09/02/14   Kristen N Ward, DO  ondansetron (ZOFRAN ODT) 4 MG disintegrating tablet Take 1 tablet (4 mg total) by mouth every 8 (eight) hours as needed for nausea or vomiting. Patient not taking: Reported on 01/28/2015 09/02/14   Kristen N Ward, DO  ondansetron (ZOFRAN) 4 MG tablet Take 1 tablet (4 mg total) by mouth every 8 (eight) hours as needed for nausea or vomiting. 01/28/15   Francine Graven, DO  oseltamivir (TAMIFLU) 75 MG capsule Take 1 capsule (75 mg total) by mouth every 12 (twelve) hours. Patient not taking: Reported on 01/28/2015 09/02/14   Kristen N Ward, DO   BP 143/99 mmHg  Pulse 81  Temp(Src) 97.7 F (36.5 C) (Oral)  Resp 18  SpO2 100% Physical Exam  Constitutional: She appears well-developed and well-nourished. No distress.  HENT:  Head: Normocephalic and atraumatic.  Mucous membranes dry  Eyes: Conjunctivae are normal. Pupils are equal, round, and reactive  to light.  Neck: Neck supple. No tracheal deviation present. No thyromegaly present.  Cardiovascular: Normal rate and regular rhythm.   No murmur heard. Pulmonary/Chest: Effort normal and breath sounds normal. No respiratory distress.  Diffuse scant rhonchi  Abdominal: Soft. Bowel sounds are normal. She exhibits no distension. There is no tenderness.  Musculoskeletal: Normal range of motion. She exhibits no edema or  tenderness.  Neurological: She is alert. Coordination normal.  Skin: Skin is warm and dry. No rash noted.  Psychiatric: She has a normal mood and affect.  Nursing note and vitals reviewed.   ED Course  Procedures (including critical care time) Labs Review Labs Reviewed - No data to display  Imaging Review No results found. I have personally reviewed and evaluated these images and lab results as part of my medical decision-making.   EKG Interpretation None     11: 45 AM patient feels improved after treatment with intravenous fluids. She is not nauseated. She complains of mild diffuse headache. Tylenol ordered. Results for orders placed or performed during the hospital encounter of 02/28/15  Comprehensive metabolic panel  Result Value Ref Range   Sodium 137 135 - 145 mmol/L   Potassium 4.8 3.5 - 5.1 mmol/L   Chloride 106 101 - 111 mmol/L   CO2 24 22 - 32 mmol/L   Glucose, Bld 100 (H) 65 - 99 mg/dL   BUN 10 6 - 20 mg/dL   Creatinine, Ser 0.67 0.44 - 1.00 mg/dL   Calcium 9.8 8.9 - 10.3 mg/dL   Total Protein 7.2 6.5 - 8.1 g/dL   Albumin 4.1 3.5 - 5.0 g/dL   AST 25 15 - 41 U/L   ALT 21 14 - 54 U/L   Alkaline Phosphatase 81 38 - 126 U/L   Total Bilirubin 0.7 0.3 - 1.2 mg/dL   GFR calc non Af Amer >60 >60 mL/min   GFR calc Af Amer >60 >60 mL/min   Anion gap 7 5 - 15  CBC with Differential/Platelet  Result Value Ref Range   WBC 9.8 4.0 - 10.5 K/uL   RBC 4.88 3.87 - 5.11 MIL/uL   Hemoglobin 14.8 12.0 - 15.0 g/dL   HCT 42.9 36.0 - 46.0 %   MCV 87.9 78.0 - 100.0 fL   MCH 30.3 26.0 - 34.0 pg   MCHC 34.5 30.0 - 36.0 g/dL   RDW 12.9 11.5 - 15.5 %   Platelets 271 150 - 400 K/uL   Neutrophils Relative % 80 %   Neutro Abs 8.0 (H) 1.7 - 7.7 K/uL   Lymphocytes Relative 14 %   Lymphs Abs 1.3 0.7 - 4.0 K/uL   Monocytes Relative 5 %   Monocytes Absolute 0.5 0.1 - 1.0 K/uL   Eosinophils Relative 1 %   Eosinophils Absolute 0.1 0.0 - 0.7 K/uL   Basophils Relative 0 %   Basophils  Absolute 0.0 0.0 - 0.1 K/uL   Dg Chest 2 View  02/28/2015   CLINICAL DATA:  Right-sided chest pain radiating to back. Cough. Dizziness. Vomiting. Shortness of breath.  EXAM: CHEST  2 VIEW  COMPARISON:  None.  FINDINGS: The heart size and mediastinal contours are within normal limits. Both lungs are clear. The visualized skeletal structures are unremarkable.  IMPRESSION: No active cardiopulmonary disease.   Electronically Signed   By: Earle Gell M.D.   On: 02/28/2015 09:24   Chest xray viewed by me  MDM  Counseled patient for 5 minutes on smoking cessation. Plan appear or HFA with  spacer to go to use 2 puffs every 4 hours when necessary cough or shortness of breath. Encourage oral hydration. Prescription Zofran. Referral [place] in [place] and to resource guide. BP recheck 3 weeks Final diagnoses:  None   Dx #1 nausea, vomiting and diarrhea #2 bronchitis #3 mild dehydration #4 tobacco abuse #5 elevated blood pressure     Orlie Dakin, MD 02/28/15 1152  Orlie Dakin, MD 02/28/15 1153

## 2015-02-28 NOTE — ED Notes (Signed)
Pt states woke up this morning with multiple symptoms.  Pt states dizzy, chest pain, body aches, diarrhea, chest congestion, cough, shortness of breath, emesis.  Unknown for fever.

## 2015-03-13 ENCOUNTER — Ambulatory Visit (INDEPENDENT_AMBULATORY_CARE_PROVIDER_SITE_OTHER): Payer: BLUE CROSS/BLUE SHIELD | Admitting: Family Medicine

## 2015-03-13 VITALS — BP 110/74 | HR 110 | Temp 98.1°F | Resp 16 | Ht 64.5 in | Wt 152.0 lb

## 2015-03-13 DIAGNOSIS — K920 Hematemesis: Secondary | ICD-10-CM | POA: Diagnosis not present

## 2015-03-13 DIAGNOSIS — B9689 Other specified bacterial agents as the cause of diseases classified elsewhere: Secondary | ICD-10-CM

## 2015-03-13 DIAGNOSIS — R110 Nausea: Secondary | ICD-10-CM | POA: Diagnosis not present

## 2015-03-13 DIAGNOSIS — A499 Bacterial infection, unspecified: Secondary | ICD-10-CM

## 2015-03-13 DIAGNOSIS — N760 Acute vaginitis: Secondary | ICD-10-CM | POA: Diagnosis not present

## 2015-03-13 DIAGNOSIS — K2970 Gastritis, unspecified, without bleeding: Secondary | ICD-10-CM

## 2015-03-13 DIAGNOSIS — N3000 Acute cystitis without hematuria: Secondary | ICD-10-CM

## 2015-03-13 DIAGNOSIS — R102 Pelvic and perineal pain: Secondary | ICD-10-CM | POA: Diagnosis not present

## 2015-03-13 DIAGNOSIS — E860 Dehydration: Secondary | ICD-10-CM | POA: Diagnosis not present

## 2015-03-13 DIAGNOSIS — R112 Nausea with vomiting, unspecified: Secondary | ICD-10-CM

## 2015-03-13 DIAGNOSIS — B9681 Helicobacter pylori [H. pylori] as the cause of diseases classified elsewhere: Secondary | ICD-10-CM | POA: Diagnosis not present

## 2015-03-13 LAB — POCT CBC
Granulocyte percent: 64.9 %G (ref 37–80)
HEMATOCRIT: 44 % (ref 37.7–47.9)
Hemoglobin: 13.8 g/dL (ref 12.2–16.2)
Lymph, poc: 2.7 (ref 0.6–3.4)
MCH, POC: 27.8 pg (ref 27–31.2)
MCHC: 31.5 g/dL — AB (ref 31.8–35.4)
MCV: 88.2 fL (ref 80–97)
MID (CBC): 0.3 (ref 0–0.9)
MPV: 8.9 fL (ref 0–99.8)
POC GRANULOCYTE: 5.6 (ref 2–6.9)
POC LYMPH %: 31.4 % (ref 10–50)
POC MID %: 3.7 % (ref 0–12)
Platelet Count, POC: 316 10*3/uL (ref 142–424)
RBC: 4.99 M/uL (ref 4.04–5.48)
RDW, POC: 13.6 %
WBC: 8.7 10*3/uL (ref 4.6–10.2)

## 2015-03-13 LAB — POCT URINALYSIS DIP (MANUAL ENTRY)
Bilirubin, UA: NEGATIVE
GLUCOSE UA: NEGATIVE
Ketones, POC UA: NEGATIVE
NITRITE UA: POSITIVE — AB
PH UA: 7
PROTEIN UA: NEGATIVE
RBC UA: NEGATIVE
SPEC GRAV UA: 1.02
Urobilinogen, UA: 2

## 2015-03-13 LAB — POCT WET + KOH PREP
TRICH BY WET PREP: ABSENT
YEAST BY WET PREP: ABSENT
Yeast by KOH: ABSENT

## 2015-03-13 LAB — POC MICROSCOPIC URINALYSIS (UMFC): Mucus: ABSENT

## 2015-03-13 LAB — POCT URINE PREGNANCY: Preg Test, Ur: NEGATIVE

## 2015-03-13 MED ORDER — OMEPRAZOLE 40 MG PO CPDR: 40.0000 mg | DELAYED_RELEASE_CAPSULE | Freq: Every day | ORAL | Status: DC

## 2015-03-13 MED ORDER — ONDANSETRON 4 MG PO TBDP
8.0000 mg | ORAL_TABLET | Freq: Once | ORAL | Status: AC
  Administered 2015-03-13: 8 mg via ORAL

## 2015-03-13 MED ORDER — GI COCKTAIL ~~LOC~~
30.0000 mL | Freq: Once | ORAL | Status: AC
  Administered 2015-03-13: 30 mL via ORAL

## 2015-03-13 MED ORDER — METRONIDAZOLE 500 MG PO TABS: 500.0000 mg | ORAL_TABLET | Freq: Two times a day (BID) | ORAL | Status: DC

## 2015-03-13 MED ORDER — CIPROFLOXACIN HCL 500 MG PO TABS: 500.0000 mg | ORAL_TABLET | Freq: Two times a day (BID) | ORAL | Status: DC

## 2015-03-13 NOTE — Progress Notes (Addendum)
Subjective:  This chart was scribed for Lynn Cheadle, MD by [name], medical scribe at [facility].The patient was seen in exam room 13 and the patient's care was started at 3:46 PM.   Patient ID: [name], female    DOB: 1965-10-23, 49 y.o.   MRN: [id number] Chief Complaint  Patient presents with  . Fatigue    Started Friday   . Vomiting  . Chills  . Headache  . Dizziness   HPI HPI Comments: [name] is a 49 y.o. female who presents to [facility] complaining of dizziness, chills, diaphoresis, headache, fatigue and emesis for the past three weeks. Seen in the ED on two occasions but symptoms have persisted. ER 6 weeks ago then two weeks ago. CBC and CMP normal two weeks ago, no other studies were done. Two episodes of emesis today. Vomit is described as black and green. Unable to keep foods down, has not eaten in two days. Drinking apple and orange juice and unable to keep this down. Taken nyquil, advil, and zofran. Also having right sided chest pain and shoulder pain. Drives the city bus, around sick contacts. Still has her gallbladder.  +Urinary frequency and urgency. No dysuria. +flank pain.  History reviewed. No pertinent past medical history. Current Outpatient Prescriptions on File Prior to Visit  Medication Sig Dispense Refill  . naproxen sodium (ANAPROX) 220 MG tablet Take 440 mg by mouth 2 (two) times daily with a meal.    . ondansetron (ZOFRAN) 8 MG tablet Take 1 tablet (8 mg total) by mouth every 8 (eight) hours as needed for nausea or vomiting. 12 tablet 0   No current facility-administered medications on file prior to visit.   Allergies  Allergen Reactions  . Hydrocodone Itching   Depression screen [facility] 2/9 03/16/2015 03/13/2015  Decreased Interest 0 0  Down, Depressed, Hopeless 0 0  PHQ - 2 Score 0 0     Review of Systems  Constitutional: Positive for fever, chills, diaphoresis, activity change, appetite  change and fatigue. Negative for unexpected weight change.  Respiratory: Negative for chest tightness and shortness of breath.   Cardiovascular: Positive for chest pain. Negative for palpitations and leg swelling.  Gastrointestinal: Positive for nausea, vomiting and abdominal pain. Negative for diarrhea, constipation, blood in stool, abdominal distention, anal bleeding and rectal pain.  Genitourinary: Positive for pelvic pain. Negative for hematuria and menstrual problem.  Musculoskeletal: Positive for back pain and arthralgias. Negative for joint swelling and gait problem.  Skin: Negative for color change and pallor.  Allergic/Immunologic: Positive for environmental allergies. Negative for immunocompromised state.  Neurological: Positive for dizziness, weakness, light-headedness and headaches. Negative for numbness.  Hematological: Negative for adenopathy.  Psychiatric/Behavioral: Negative for dysphoric mood. The patient is not nervous/anxious.       Objective:  BP 110/74 mmHg  Pulse 110  Temp(Src) 98.1 F (36.7 C) (Oral)  Resp 16  Ht 5' 4.5" (1.638 m)  Wt 152 lb (68.947 kg)  BMI 25.70 kg/m2  SpO2 99% Physical Exam  Constitutional: She is oriented to person, place, and time. She appears well-developed and well-nourished. No distress.  HENT:  Head: Normocephalic and atraumatic.  Eyes: Pupils are equal, round, and reactive to light.  Neck: Normal range of motion.  Cardiovascular: Normal rate and regular rhythm.   Pulmonary/Chest: Effort normal. No respiratory distress.  Musculoskeletal: Normal range of motion.  Neurological: She is alert and oriented to person, place, and time.  Skin: Skin is warm and dry.  Psychiatric: She has a normal mood and affect. Her behavior is normal.  Nursing note and vitals reviewed.     Assessment & Plan:   1. Dehydration   2. Nausea and vomiting, vomiting of unspecified type   3. Hematemesis with nausea   4. Pelvic pain in female   5. Acute  cystitis without hematuria - cipro 500 bid x 3d  6. Bacterial vaginosis - flagyl po bid x 1 wk   Pt requested to RTC in 3d for repeat eval. Push fluids.  Obtain imaging due to length and persistence of GI sxs.  Suspect PUD due to coffee-ground emesis so start bid ppi.  ADDENDUM: + h. Pylori breath test. Start triple therapy AFTER cipro and flagyl are completed.  Orders Placed This Encounter  Procedures  . Urine culture  . GC/Chlamydia Probe Amp  . CT Abdomen Pelvis W Contrast    Standing Status: Future     Number of Occurrences:      Standing Expiration Date: 06/12/2016    Order Specific Question:  Reason for Exam (SYMPTOM  OR DIAGNOSIS REQUIRED)    Answer:  persistent nausea/diarrhea/abd distention/pain for over 2 months    Order Specific Question:  Is the patient pregnant?    Answer:  No    Order Specific Question:  Preferred imaging location?    Answer:  [facility]  . Comprehensive metabolic panel  . Lipase  . TSH  . H. pylori breath test  . Sedimentation Rate  . POCT CBC  . POCT urinalysis dipstick  . POCT Microscopic Urinalysis ([facility])  . POCT urine pregnancy  . POCT Wet + KOH Prep ([facility])    Meds ordered this encounter  Medications  . gi cocktail (Maalox,Lidocaine,Donnatal)    Sig:   . ondansetron (ZOFRAN-ODT) disintegrating tablet 8 mg    Sig:   . ciprofloxacin (CIPRO) 500 MG tablet    Sig: Take 1 tablet (500 mg total) by mouth 2 (two) times daily.    Dispense:  6 tablet    Refill:  0  . omeprazole (PRILOSEC) 40 MG capsule    Sig: Take 1 capsule (40 mg total) by mouth daily.    Dispense:  30 capsule    Refill:  2  . metroNIDAZOLE (FLAGYL) 500 MG tablet    Sig: Take 1 tablet (500 mg total) by mouth 2 (two) times daily.    Dispense:  14 tablet    Refill:  0    I personally performed the services described in this documentation, which was scribed in my presence. The recorded information has been reviewed and considered, and addended by me as needed.  Lynn Cheadle, MD MPH   By signing my name below, I, Nadim Abuhashem, attest that this documentation has been prepared under the direction and in the presence of Lynn Cheadle, MD.  Electronically Signed: Lora Havens, medical scribe. 03/13/2015, 3:45 PM.   Results for orders placed or performed in visit on 03/13/15  Comprehensive metabolic panel  Result Value Ref Range   Sodium 137 135 - 146 mmol/L   Potassium 5.1 3.5 - 5.3 mmol/L   Chloride 104 98 - 110 mmol/L   CO2 18 (L) 20 - 31 mmol/L   Glucose, Bld 79 65 - 99 mg/dL   BUN 11 7 - 25 mg/dL   Creat 0.80 0.50 - 1.10 mg/dL   Total Bilirubin 0.6 0.2 - 1.2 mg/dL   Alkaline Phosphatase 91 33 - 115  U/L   AST 53 (H) 10 - 35 U/L   ALT 23 6 - 29 U/L   Total Protein 7.0 6.1 - 8.1 g/dL   Albumin 4.2 3.6 - 5.1 g/dL   Calcium 10.3 (H) 8.6 - 10.2 mg/dL  Lipase  Result Value Ref Range   Lipase 23 7 - 60 U/L  TSH  Result Value Ref Range   TSH 0.867 0.350 - 4.500 uIU/mL  Sedimentation Rate  Result Value Ref Range   Sed Rate 13 0 - 20 mm/hr  POCT CBC  Result Value Ref Range   WBC 8.7 4.6 - 10.2 K/uL   Lymph, poc 2.7 0.6 - 3.4   POC LYMPH PERCENT 31.4 10 - 50 %L   MID (cbc) 0.3 0 - 0.9   POC MID % 3.7 0 - 12 %M   POC Granulocyte 5.6 2 - 6.9   Granulocyte percent 64.9 37 - 80 %G   RBC 4.99 4.04 - 5.48 M/uL   Hemoglobin 13.8 12.2 - 16.2 g/dL   HCT, POC 44.0 37.7 - 47.9 %   MCV 88.2 80 - 97 fL   MCH, POC 27.8 27 - 31.2 pg   MCHC 31.5 (A) 31.8 - 35.4 g/dL   RDW, POC 13.6 %   Platelet Count, POC 316 142 - 424 K/uL   MPV 8.9 0 - 99.8 fL  POCT urinalysis dipstick  Result Value Ref Range   Color, UA yellow yellow   Clarity, UA cloudy (A) clear   Glucose, UA negative negative   Bilirubin, UA negative negative   Ketones, POC UA negative negative   Spec Grav, UA 1.020    Blood, UA negative negative   pH, UA 7.0    Protein Ur, POC negative negative   Urobilinogen, UA 2.0    Nitrite, UA Positive (A) Negative   Leukocytes, UA Trace (A) Negative    POCT Microscopic Urinalysis ([facility])  Result Value Ref Range   WBC,UR,HPF,POC Few (A) None WBC/hpf   RBC,UR,HPF,POC None None RBC/hpf   Bacteria Few (A) None   Mucus Absent Absent   Epithelial Cells, UR Per Microscopy Few (A) None cells/hpf  POCT urine pregnancy  Result Value Ref Range   Preg Test, Ur Negative Negative  POCT Wet + KOH Prep ([facility])  Result Value Ref Range   Yeast by KOH Absent Present, Absent   Yeast by wet prep Absent Present, Absent   WBC by wet prep None None, Few   Clue Cells Wet Prep HPF POC Moderate (A) None   Trich by wet prep Absent Present, Absent   Bacteria Wet Prep HPF POC Few None, Few   Epithelial Cells By [employer] Pref ([facility]) Moderate (A) None, Few   RBC,UR,HPF,POC None None RBC/hpf

## 2015-03-13 NOTE — Patient Instructions (Addendum)
Take the omeprazole 30 minutes before breakfast and dinner.  Take the cipro antibiotic with breakfast and dinner (start today).  Keep the zofran on board taking 3 times a day (breakfast, mid-afternoon, before bed).   Food Choices to Help Relieve Diarrhea When you have diarrhea, the foods you eat and your eating habits are very important. Choosing the right foods and drinks can help relieve diarrhea. Also, because diarrhea can last up to 7 days, you need to replace lost fluids and electrolytes (such as sodium, potassium, and chloride) in order to help prevent dehydration.  WHAT GENERAL GUIDELINES DO I NEED TO FOLLOW?  Slowly drink 1 cup (8 oz) of fluid for each episode of diarrhea. If you are getting enough fluid, your urine will be clear or pale yellow.  Eat starchy foods. Some good choices include white rice, white toast, pasta, low-fiber cereal, baked potatoes (without the skin), saltine crackers, and bagels.  Avoid large servings of any cooked vegetables.  Limit fruit to two servings per day. A serving is  cup or 1 small piece.  Choose foods with less than 2 g of fiber per serving.  Limit fats to less than 8 tsp (38 g) per day.  Avoid fried foods.  Eat foods that have probiotics in them. Probiotics can be found in certain dairy products.  Avoid foods and beverages that may increase the speed at which food moves through the stomach and intestines (gastrointestinal tract). Things to avoid include:  High-fiber foods, such as dried fruit, raw fruits and vegetables, nuts, seeds, and whole grain foods.  Spicy foods and high-fat foods.  Foods and beverages sweetened with high-fructose corn syrup, honey, or sugar alcohols such as xylitol, sorbitol, and mannitol. WHAT FOODS ARE RECOMMENDED? Grains White rice. White, [place], or pita breads (fresh or toasted), including plain rolls, buns, or bagels. White pasta. Saltine, soda, or graham crackers. Pretzels. Low-fiber cereal. Cooked cereals  made with water (such as cornmeal, farina, or cream cereals). Plain muffins. Matzo. Melba toast. Zwieback.  Vegetables Potatoes (without the skin). Strained tomato and vegetable juices. Most well-cooked and canned vegetables without seeds. Tender lettuce. Fruits Cooked or canned applesauce, apricots, cherries, fruit cocktail, grapefruit, peaches, pears, or plums. Fresh bananas, apples without skin, cherries, grapes, cantaloupe, grapefruit, peaches, oranges, or plums.  Meat and Other Protein Products Baked or boiled chicken. Eggs. Tofu. Fish. Seafood. Smooth peanut butter. Ground or well-cooked tender beef, ham, veal, lamb, pork, or poultry.  Dairy Plain yogurt, kefir, and unsweetened liquid yogurt. Lactose-free milk, buttermilk, or soy milk. Plain hard cheese. Beverages Sport drinks. Clear broths. Diluted fruit juices (except prune). Regular, caffeine-free sodas such as ginger ale. Water. Decaffeinated teas. Oral rehydration solutions. Sugar-free beverages not sweetened with sugar alcohols. Other Bouillon, broth, or soups made from recommended foods.  The items listed above may not be a complete list of recommended foods or beverages. Contact your dietitian for more options. WHAT FOODS ARE NOT RECOMMENDED? Grains Whole grain, whole wheat, bran, or rye breads, rolls, pastas, crackers, and cereals. Wild or brown rice. Cereals that contain more than 2 g of fiber per serving. Corn tortillas or taco shells. Cooked or dry oatmeal. Granola. Popcorn. Vegetables Raw vegetables. Cabbage, broccoli, Brussels sprouts, artichokes, baked beans, beet greens, corn, kale, legumes, peas, sweet potatoes, and yams. Potato skins. Cooked spinach and cabbage. Fruits Dried fruit, including raisins and dates. Raw fruits. Stewed or dried prunes. Fresh apples with skin, apricots, mangoes, pears, raspberries, and strawberries.  Meat and Other Protein Products Chunky peanut  butter. Nuts and seeds. Beans and lentils. Berniece Salines.   Dairy High-fat cheeses. Milk, chocolate milk, and beverages made with milk, such as milk shakes. Cream. Ice cream. Sweets and Desserts Sweet rolls, doughnuts, and sweet breads. Pancakes and waffles. Fats and Oils Butter. Cream sauces. Margarine. Salad oils. Plain salad dressings. Olives. Avocados.  Beverages Caffeinated beverages (such as coffee, tea, soda, or energy drinks). Alcoholic beverages. Fruit juices with pulp. Prune juice. Soft drinks sweetened with high-fructose corn syrup or sugar alcohols. Other Coconut. Hot sauce. Chili powder. Mayonnaise. Gravy. Cream-based or milk-based soups.  The items listed above may not be a complete list of foods and beverages to avoid. Contact your dietitian for more information. WHAT SHOULD I DO IF I BECOME DEHYDRATED? Diarrhea can sometimes lead to dehydration. Signs of dehydration include dark urine and dry mouth and skin. If you think you are dehydrated, you should rehydrate with an oral rehydration solution. These solutions can be purchased at pharmacies, retail stores, or online.  Drink -1 cup (120-240 mL) of oral rehydration solution each time you have an episode of diarrhea. If drinking this amount makes your diarrhea worse, try drinking smaller amounts more often. For example, drink 1-3 tsp (5-15 mL) every 5-10 minutes.  A general rule for staying hydrated is to drink 1-2 L of fluid per day. Talk to your health care provider about the specific amount you should be drinking each day. Drink enough fluids to keep your urine clear or pale yellow. Document Released: 08/18/2003 Document Revised: 06/02/2013 Document Reviewed: 04/20/2013 [facility] Patient Information 2015 [place], [place]. This information is not intended to replace advice given to you by your health care provider. Make sure you discuss any questions you have with your health care provider. Dehydration, Adult Dehydration is when you lose more fluids from the body than you take in. Vital  organs like the kidneys, brain, and heart cannot function without a proper amount of fluids and salt. Any loss of fluids from the body can cause dehydration.  CAUSES   Vomiting.  Diarrhea.  Excessive sweating.  Excessive urine output.  Fever. SYMPTOMS  Mild dehydration  Thirst.  Dry lips.  Slightly dry mouth. Moderate dehydration  Very dry mouth.  Sunken eyes.  Skin does not bounce back quickly when lightly pinched and released.  Dark urine and decreased urine production.  Decreased tear production.  Headache. Severe dehydration  Very dry mouth.  Extreme thirst.  Rapid, weak pulse (more than 100 beats per minute at rest).  Cold hands and feet.  Not able to sweat in spite of heat and temperature.  Rapid breathing.  Blue lips.  Confusion and lethargy.  Difficulty being awakened.  Minimal urine production.  No tears. DIAGNOSIS  Your caregiver will diagnose dehydration based on your symptoms and your exam. Blood and urine tests will help confirm the diagnosis. The diagnostic evaluation should also identify the cause of dehydration. TREATMENT  Treatment of mild or moderate dehydration can often be done at home by increasing the amount of fluids that you drink. It is best to drink small amounts of fluid more often. Drinking too much at one time can make vomiting worse. Refer to the home care instructions below. Severe dehydration needs to be treated at the hospital where you will probably be given intravenous (IV) fluids that contain water and electrolytes. HOME CARE INSTRUCTIONS   Ask your caregiver about specific rehydration instructions.  Drink enough fluids to keep your urine clear or pale yellow.  Drink small amounts frequently  if you have nausea and vomiting.  Eat as you normally do.  Avoid:  Foods or drinks high in sugar.  Carbonated drinks.  Juice.  Extremely hot or cold fluids.  Drinks with caffeine.  Fatty, greasy  foods.  Alcohol.  Tobacco.  Overeating.  Gelatin desserts.  Wash your hands well to avoid spreading bacteria and viruses.  Only take over-the-counter or prescription medicines for pain, discomfort, or fever as directed by your caregiver.  Ask your caregiver if you should continue all prescribed and over-the-counter medicines.  Keep all follow-up appointments with your caregiver. SEEK MEDICAL CARE IF:  You have abdominal pain and it increases or stays in one area (localizes).  You have a rash, stiff neck, or severe headache.  You are irritable, sleepy, or difficult to awaken.  You are weak, dizzy, or extremely thirsty. SEEK IMMEDIATE MEDICAL CARE IF:   You are unable to keep fluids down or you get worse despite treatment.  You have frequent episodes of vomiting or diarrhea.  You have blood or green matter (bile) in your vomit.  You have blood in your stool or your stool looks black and tarry.  You have not urinated in 6 to 8 hours, or you have only urinated a small amount of very dark urine.  You have a fever.  You faint. MAKE SURE YOU:   Understand these instructions.  Will watch your condition.  Will get help right away if you are not doing well or get worse. Document Released: 05/28/2005 Document Revised: 08/20/2011 Document Reviewed: 01/15/2011 [facility] Patient Information 2015 [place], [place]. This information is not intended to replace advice given to you by your health care provider. Make sure you discuss any questions you have with your health care provider.  Food Choices for Peptic Ulcer Disease When you have peptic ulcer disease, the foods you eat and your eating habits are very important. Choosing the right foods can help ease the discomfort of peptic ulcer disease. WHAT GENERAL GUIDELINES DO I NEED TO FOLLOW?  Choose fruits, vegetables, whole grains, and low-fat meat, fish, and poultry.   Keep a food diary to identify foods that cause  symptoms.  Avoid foods that cause irritation or pain. These may be different for different people.  Eat frequent small meals instead of three large meals each day. The pain may be worse when your stomach is empty.  Avoid eating close to bedtime. WHAT FOODS ARE NOT RECOMMENDED? The following are some foods and drinks that may worsen your symptoms:  Black, white, and red pepper.  Hot sauce.  Chili peppers.  Chili powder.  Chocolate and cocoa.   Alcohol.  Tea, coffee, and cola (regular and decaffeinated). The items listed above may not be a complete list of foods and beverages to avoid. Contact your dietitian for more information. Document Released: 08/20/2011 Document Revised: 06/02/2013 Document Reviewed: 04/01/2013 [facility] Patient Information 2015 [place], [place]. This information is not intended to replace advice given to you by your health care provider. Make sure you discuss any questions you have with your health care provider.  Bacterial Vaginosis Bacterial vaginosis is a vaginal infection that occurs when the normal balance of bacteria in the vagina is disrupted. It results from an overgrowth of certain bacteria. This is the most common vaginal infection in women of childbearing age. Treatment is important to prevent complications, especially in pregnant women, as it can cause a premature delivery. CAUSES  Bacterial vaginosis is caused by an increase in harmful bacteria that are normally  present in smaller amounts in the vagina. Several different kinds of bacteria can cause bacterial vaginosis. However, the reason that the condition develops is not fully understood. RISK FACTORS Certain activities or behaviors can put you at an increased risk of developing bacterial vaginosis, including:  Having a new sex partner or multiple sex partners.  Douching.  Using an intrauterine device (IUD) for contraception. Women do not get bacterial vaginosis from toilet seats, bedding,  swimming pools, or contact with objects around them. SIGNS AND SYMPTOMS  Some women with bacterial vaginosis have no signs or symptoms. Common symptoms include:  Grey vaginal discharge.  A fishlike odor with discharge, especially after sexual intercourse.  Itching or burning of the vagina and vulva.  Burning or pain with urination. DIAGNOSIS  Your health care provider will take a medical history and examine the vagina for signs of bacterial vaginosis. A sample of vaginal fluid may be taken. Your health care provider will look at this sample under a microscope to check for bacteria and abnormal cells. A vaginal pH test may also be done.  TREATMENT  Bacterial vaginosis may be treated with antibiotic medicines. These may be given in the form of a pill or a vaginal cream. A second round of antibiotics may be prescribed if the condition comes back after treatment.  HOME CARE INSTRUCTIONS   Only take over-the-counter or prescription medicines as directed by your health care provider.  If antibiotic medicine was prescribed, take it as directed. Make sure you finish it even if you start to feel better.  Do not have sex until treatment is completed.  Tell all sexual partners that you have a vaginal infection. They should see their health care provider and be treated if they have problems, such as a mild rash or itching.  Practice safe sex by using condoms and only having one sex partner. SEEK MEDICAL CARE IF:   Your symptoms are not improving after 3 days of treatment.  You have increased discharge or pain.  You have a fever. MAKE SURE YOU:   Understand these instructions.  Will watch your condition.  Will get help right away if you are not doing well or get worse. FOR MORE INFORMATION  Centers for Disease Control and Prevention, Division of STD Prevention: [employer]raiser[employer]raud[employer] American Sexual Health Association (ASHA): www.ashastd.org  Document Released: 05/28/2005 Document Revised:  03/18/2013 Document Reviewed: 01/07/2013 [facility] Patient Information 2015 [place], [place]. This information is not intended to replace advice given to you by your health care provider. Make sure you discuss any questions you have with your health care provider.

## 2015-03-14 ENCOUNTER — Encounter: Payer: Self-pay | Admitting: Family Medicine

## 2015-03-14 LAB — COMPREHENSIVE METABOLIC PANEL
ALT: 23 U/L (ref 6–29)
AST: 53 U/L — ABNORMAL HIGH (ref 10–35)
Albumin: 4.2 g/dL (ref 3.6–5.1)
Alkaline Phosphatase: 91 U/L (ref 33–115)
BUN: 11 mg/dL (ref 7–25)
CHLORIDE: 104 mmol/L (ref 98–110)
CO2: 18 mmol/L — AB (ref 20–31)
CREATININE: 0.8 mg/dL (ref 0.50–1.10)
Calcium: 10.3 mg/dL — ABNORMAL HIGH (ref 8.6–10.2)
GLUCOSE: 79 mg/dL (ref 65–99)
Potassium: 5.1 mmol/L (ref 3.5–5.3)
SODIUM: 137 mmol/L (ref 135–146)
TOTAL PROTEIN: 7 g/dL (ref 6.1–8.1)
Total Bilirubin: 0.6 mg/dL (ref 0.2–1.2)

## 2015-03-14 LAB — TSH: TSH: 0.867 u[IU]/mL (ref 0.350–4.500)

## 2015-03-14 LAB — SEDIMENTATION RATE: SED RATE: 13 mm/h (ref 0–20)

## 2015-03-14 LAB — LIPASE: LIPASE: 23 U/L (ref 7–60)

## 2015-03-15 LAB — GC/CHLAMYDIA PROBE AMP
CT PROBE, AMP APTIMA: NEGATIVE
GC Probe RNA: NEGATIVE

## 2015-03-15 LAB — H. PYLORI BREATH TEST: H. pylori Breath Test: DETECTED — AB

## 2015-03-15 MED ORDER — CLARITHROMYCIN 500 MG PO TABS: 500.0000 mg | ORAL_TABLET | Freq: Two times a day (BID) | ORAL | Status: DC

## 2015-03-15 MED ORDER — AMOXICILLIN 500 MG PO CAPS: 1000.0000 mg | ORAL_CAPSULE | Freq: Two times a day (BID) | ORAL | Status: DC

## 2015-03-16 ENCOUNTER — Ambulatory Visit (INDEPENDENT_AMBULATORY_CARE_PROVIDER_SITE_OTHER): Payer: BLUE CROSS/BLUE SHIELD | Admitting: Family Medicine

## 2015-03-16 VITALS — BP 122/78 | HR 96 | Temp 98.4°F | Resp 18 | Ht 65.0 in | Wt 154.4 lb

## 2015-03-16 DIAGNOSIS — R1084 Generalized abdominal pain: Secondary | ICD-10-CM | POA: Diagnosis not present

## 2015-03-16 DIAGNOSIS — N3000 Acute cystitis without hematuria: Secondary | ICD-10-CM

## 2015-03-16 LAB — POC MICROSCOPIC URINALYSIS (UMFC): Calcium Oxalate: POSITIVE

## 2015-03-16 LAB — POCT URINALYSIS DIP (MANUAL ENTRY)
BILIRUBIN UA: NEGATIVE
Bilirubin, UA: NEGATIVE
Blood, UA: NEGATIVE
Glucose, UA: NEGATIVE
Nitrite, UA: NEGATIVE
PH UA: 5.5
PROTEIN UA: NEGATIVE
UROBILINOGEN UA: 0.2

## 2015-03-16 LAB — URINE CULTURE: Colony Count: >=100000

## 2015-03-16 MED ORDER — OMEPRAZOLE 40 MG PO CPDR: 40.0000 mg | DELAYED_RELEASE_CAPSULE | Freq: Two times a day (BID) | ORAL | Status: DC

## 2015-03-16 NOTE — Progress Notes (Signed)
Subjective:    Patient ID: [name], female    DOB: Oct 17, 1965, 49 y.o.   MRN: [id number] This chart was scribed for [name], MD by Zola Button, Medical Scribe. This patient was seen in Room 14 and the patient's care was started at 2:11 PM.   Chief Complaint  Patient presents with  . Follow-up    Was seen on 03/13/2015 for dehydration. Pt. is not feeling better.     HPI HPI Comments: [name] is a 49 y.o. female who presents to the [facility] for a follow-up. Patient was seen 3 days ago with symptoms including chills, diaphoresis, headache, fatigue, and vomiting of coffee-ground emesis over the past 3 weeks. She notes that she is not improved today. She is still having dizziness, lightheadedness, nausea, fatigue, and flank pain. The dizziness is not a room-spinning sensation. She notes that she had a syncopal episode last Friday, 5 days ago. Patient is still taking the Flagyl and has finished the course of Cipro. She has not had a bowel movement every day. She has been drinking fluids such as orange juice and apple juice. Patient has been eating one meal a day around 4:00 PM, usually just soup. 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 Patient denies alcohol use.    Depression screen [facility] 2/9 03/16/2015 03/13/2015  Decreased Interest 0 0  Down, Depressed, Hopeless 0 0  PHQ - 2 Score 0 0    History reviewed. No pertinent past medical history. Current Outpatient Prescriptions on File Prior to Visit  Medication Sig Dispense Refill  . amoxicillin (AMOXIL) 500 MG capsule Take 2 capsules (1,000 mg total) by mouth 2 (two) times daily. 56 capsule 0  . clarithromycin (BIAXIN) 500 MG tablet Take 1 tablet (500 mg total) by mouth 2 (two) times daily. 28 tablet 0  . metroNIDAZOLE (FLAGYL) 500 MG tablet Take 1 tablet (500 mg total) by mouth 2 (two) times daily. 14 tablet 0  . ondansetron (ZOFRAN) 8 MG tablet Take 1 tablet (8 mg total) by mouth every 8  (eight) hours as needed for nausea or vomiting. 12 tablet 0   No current facility-administered medications on file prior to visit.   Allergies  Allergen Reactions  . Hydrocodone Itching     Review of Systems  Constitutional: Positive for chills, diaphoresis and fatigue. Negative for fever, activity change, appetite change and unexpected weight change.  Respiratory: Negative for chest tightness.   Cardiovascular: Negative for chest pain, palpitations and leg swelling.  Gastrointestinal: Positive for nausea, vomiting, abdominal pain and constipation. Negative for diarrhea, blood in stool and abdominal distention.  Endocrine: Negative for polydipsia and polyphagia.  Genitourinary: Positive for flank pain and pelvic pain. Negative for dysuria, urgency, frequency, vaginal discharge, difficulty urinating and vaginal pain.  Musculoskeletal: Positive for arthralgias.  Neurological: Positive for dizziness (no vertigo), syncope, light-headedness and headaches.  Psychiatric/Behavioral: Negative for dysphoric mood. The patient is not nervous/anxious.        Objective:  BP 122/78 mmHg  Pulse 96  Temp(Src) 98.4 F (36.9 C) (Oral)  Resp 18  Ht 5\' 5"  (1.651 m)  Wt 154 lb 6.4 oz (70.035 kg)  BMI 25.69 kg/m2  SpO2 98%  Physical Exam  Constitutional: She is oriented to person, place, and time. She appears well-developed and well-nourished. No distress.  HENT:  Head: Normocephalic and atraumatic.  Mouth/Throat: Oropharynx is clear and moist. No oropharyngeal exudate.  Eyes: Pupils are equal,  round, and reactive to light.  Neck: Neck supple. No thyromegaly present.  Thyroid normal.  Cardiovascular: Regular rhythm, S1 normal and S2 normal.  Tachycardia present.   No murmur heard. Pulmonary/Chest: Effort normal.  Abdominal: Soft. She exhibits no distension. Bowel sounds are increased. There is no hepatosplenomegaly. There is generalized tenderness. There is CVA tenderness. There is no rebound  and no guarding.  Hyperactive tympanitic bowel sounds. Generalized tenderness without guarding. Positive CVA tenderness, left more than right.  Musculoskeletal: She exhibits no edema.  Lymphadenopathy:    She has no cervical adenopathy.  Neurological: She is alert and oriented to person, place, and time. No cranial nerve deficit.  Skin: Skin is warm and dry. No rash noted.  Psychiatric: She has a normal mood and affect. Her behavior is normal.  Nursing note and vitals reviewed.         Assessment & Plan:   1. Acute cystitis without hematuria - is going to finishe cipro 500 bid x 3d today.   2. Generalized abdominal pain - just had + h. Pylori breath test at visit 3d prior. Start triple therapy next wk after flagyl completed - rec probiotics as high risk of abx asstd GI sx as will have been on 4 different antibiotics within 2 wks.  Pt seems to be improving and H. Pylori diagnosis suitably explains most of her sxs so cancel CT scan but still req [place] to r/o gallstones.  3.      BV - on Day 3 of 7 on flagyl  Orders Placed This Encounter  Procedures  . US Abdomen Complete    Wt 152//no needs//bcbs//ew//epic order    Standing Status: Future     Number of Occurrences:      Standing Expiration Date: 05/15/2016    Order Specific Question:  Reason for Exam (SYMPTOM  OR DIAGNOSIS REQUIRED)    Answer:  r/o cholecystitis - diarrhea, gerd, diffuse pain for months    Order Specific Question:  Preferred imaging location?    Answer:  [facility]  . POCT urinalysis dipstick  . POCT Microscopic Urinalysis ([facility])    Meds ordered this encounter  Medications  . omeprazole (PRILOSEC) 40 MG capsule    Sig: Take 1 capsule (40 mg total) by mouth 2 (two) times daily.    Dispense:  60 capsule    Refill:  1    I personally performed the services described in this documentation, which was scribed in my presence. The recorded information has been reviewed and considered, and addended by me as  needed.  [name], MD MPH   By signing my name below, I, Zola Button, attest that this documentation has been prepared under the direction and in the presence of [name], MD.  Electronically Signed: Zola Button, Medical Scribe. 03/16/2015. 2:11 PM.

## 2015-03-16 NOTE — Patient Instructions (Addendum)
You are not drinking enough water or eating enough.  You HAVE to start eating breakfast - even just a little. Eating Healthy on a Budget There are many ways to save money at the grocery store and continue to eat healthy. You can be successful if you plan your meals according to your budget, purchase according to your budget and grocery list, and prepare food yourself.  How can I buy more food on a limited budget? Plan  Plan meals and snacks according to a grocery list and budget you create.  Look for recipes where you can cook once and make enough food for two meals.  Include meals that will "stretch" more expensive foods such as stews, casseroles, and stir-fry dishes.  Make a grocery list and make sure to bring it with you to the store. If you have a smart phone, you could use your phone to create your shopping list. Purchase  When grocery shopping, buy only the items on your grocery list and go only to the areas of the store that have the items on your list. Prepare  Some meal items can be prepared in advance. Pre-cook on days when you have extra time.  Make extra food (such as by doubling recipes) and freeze the extras in meal-sized containers or in individual portions for fast meals and snacks.  Use leftovers in your meal plan for the week.  Try some meatless meals or try "no cook" meals like salads.  When you come home from the grocery store, wash and prepare your fruits and vegetables so they are ready to use and eat. This will help reduce food waste. How can I buy more food on a limited budget?  Try these tips the next time you go shopping:   [place] store brands or generic brands.  Use coupons only for foods and brands you normally buy. Avoid buying items you wouldn't normally buy simply because they are on sale.  Check online and in newspapers for weekly deals.  Buy healthy items from the bulk bins when available, such as herbs, spices, flours, pastas, nuts, and dried  fruit.  Buy fruits and vegetables that are in season. Prices are usually lower on in-season produce.  Compare and contrast different items. You can do this by looking at the unit price on the price tag. Use it to compare different brands and sizes to find out which item is the best deal.  Choose naturally low-cost healthy items, such as carrots, potatoes, apples, bananas, and oranges. Dried or canned beans are a low-cost protein source.  Buy in bulk and freeze extra food. Items you can buy in bulk include meats, fish, poultry, frozen fruits, and frozen vegetables.  Limit the purchase of prepared or "ready-to-eat" foods, such as pre-cut fruits and vegetables and pre-made salads.  If possible, shop around to discover which grocery store offers the best prices. Some stores charge much more than other stores for the same items.  Do not shop when you are hungry. If you shop while hungry, It may be hard to stick to your list and budget.  Stick to your list and resist impulse buys. Treat your list as your official plan for the week.  Buy a variety of vegetables and fruit by purchasing fresh, frozen, and canned items.  Look beyond eye level. Foods at eye level (adult or child eye level) are more expensive. Look at the top and bottom shelves for deals.  Be efficient with your time when shopping. The more  time you spend at the store, the more money you are likely to spend.  Consider other retailers such as dollar stores, larger [employer], local fruit and vegetable stands, and farmers markets. What are some tips for less expensive food substitutions? When choosing more expensive foods like meats and dairy, try these tips to save money:  Choose cheaper cuts of meat, such as bone-in chicken thighs and drumsticks instead skinless and boneless chicken. When you are ready to prepare the chicken, you can remove the skin yourself to make it healthier.  Choose lean meats like chicken or [place].  When choosing ground beef, make sure it is lean ground beef (92% lean, 8% fat). If you do buy a fattier ground beef, drain the fat before eating.  Buy dried beans and peas, such as lentils, split peas, or kidney beans.  For seafood, choose canned tuna, salmon, or sardines.  Eggs are a low-cost source of protein.  Buy the larger tubs of yogurt instead of individual-sized containers.  Choose water instead of sodas and other sweetened beverages.  Skip buying chips, cookies, and other "junk food". These items are usually expensive, high in calories, and low in nutritional value. How can I prepare the foods I buy in the healthiest way? Practice these tips for cooking foods in the healthiest way to reduce excess fat and calorie intake:  Steam, saute, grill, or bake foods instead of frying them.  Make sure half your plate is filled with fruits or vegetables. Choose from fresh, frozen, or canned fruits and vegetables. If eating canned, remember to rinse them before eating. This will remove any excess salt added for packaging.  Trim all fat from meat before cooking. Remove the skin from chicken or [place].  Spoon off fat from meat dishes once they have been chilled in the refrigerator and the fat has hardened on the top.  Use skim milk, low-fat milk, or evaporated skim milk when making cream sauces, soups, or puddings.  Substitute low-fat yogurt, sour cream, or cottage cheese for sour cream and mayonnaise in dips and dressings.  Try lemon juice, herbs, or spices to season food instead of salt, butter, or margarine.   This information is not intended to replace advice given to you by your health care provider. Make sure you discuss any questions you have with your health care provider.   Document Released: 01/29/2014 Document Reviewed: 01/29/2014 Elsevier Interactive Patient Education 2016 [employer]  Peptic Ulcer A peptic ulcer is a sore in the lining of your esophagus (esophageal  ulcer), stomach (gastric ulcer), or in the first part of your small intestine (duodenal ulcer). The ulcer causes erosion into the deeper tissue. CAUSES  Normally, the lining of the stomach and the small intestine protects itself from the acid that digests food. The protective lining can be damaged by:  An infection caused by a bacterium called Helicobacter pylori (H. pylori).  Regular use of nonsteroidal anti-inflammatory drugs (NSAIDs), such as ibuprofen or aspirin.  Smoking tobacco. Other risk factors include being older than 35, drinking alcohol excessively, and having a family history of ulcer disease.  SYMPTOMS   Burning pain or gnawing in the area between the chest and the belly button.  Heartburn.  Nausea and vomiting.  Bloating. The pain can be worse on an empty stomach and at night. If the ulcer results in bleeding, it can cause:  Black, tarry stools.  Vomiting of bright red blood.  Vomiting of coffee-ground-looking materials. DIAGNOSIS  A diagnosis is usually  made based upon your history and an exam. Other tests and procedures may be performed to find the cause of the ulcer. Finding a cause will help determine the best treatment. Tests and procedures may include:  Blood tests, stool tests, or breath tests to check for the bacterium H. pylori.  An upper gastrointestinal (GI) series of the esophagus, stomach, and small intestine.  An endoscopy to examine the esophagus, stomach, and small intestine.  A biopsy. TREATMENT  Treatment may include:  Eliminating the cause of the ulcer, such as smoking, NSAIDs, or alcohol.  Medicines to reduce the amount of acid in your digestive tract.  Antibiotic medicines if the ulcer is caused by the H. pylori bacterium.  An upper endoscopy to treat a bleeding ulcer.  Surgery if the bleeding is severe or if the ulcer created a hole somewhere in the digestive system. HOME CARE INSTRUCTIONS   Avoid tobacco, alcohol, and caffeine.  Smoking can increase the acid in the stomach, and continued smoking will impair the healing of ulcers.  Avoid foods and drinks that seem to cause discomfort or aggravate your ulcer.  Only take medicines as directed by your caregiver. Do not substitute over-the-counter medicines for prescription medicines without talking to your caregiver.  Keep any follow-up appointments and tests as directed. SEEK MEDICAL CARE IF:   Your do not improve within 7 days of starting treatment.  You have ongoing indigestion or heartburn. SEEK IMMEDIATE MEDICAL CARE IF:   You have sudden, sharp, or persistent abdominal pain.  You have bloody or dark black, tarry stools.  You vomit blood or vomit that looks like coffee grounds.  You become light-headed, weak, or feel faint.  You become sweaty or clammy. MAKE SURE YOU:   Understand these instructions.  Will watch your condition.  Will get help right away if you are not doing well or get worse.   This information is not intended to replace advice given to you by your health care provider. Make sure you discuss any questions you have with your health care provider.   Document Released: 05/25/2000 Document Revised: 06/18/2014 Document Reviewed: 12/26/2011 Elsevier Interactive Patient Education 2016 [place] for Peptic Ulcer Disease When you have peptic ulcer disease, the foods you eat and your eating habits are very important. Choosing the right foods can help ease the discomfort of peptic ulcer disease. WHAT GENERAL GUIDELINES DO I NEED TO FOLLOW?  Choose fruits, vegetables, whole grains, and low-fat meat, fish, and poultry.   Keep a food diary to identify foods that cause symptoms.  Avoid foods that cause irritation or pain. These may be different for different people.  Eat frequent small meals instead of three large meals each day. The pain may be worse when your stomach is empty.  Avoid eating close to bedtime. WHAT  FOODS ARE NOT RECOMMENDED? The following are some foods and drinks that may worsen your symptoms:  Black, white, and red pepper.  Hot sauce.  Chili peppers.  Chili powder.  Chocolate and cocoa.   Alcohol.  Tea, coffee, and cola (regular and decaffeinated). The items listed above may not be a complete list of foods and beverages to avoid. Contact your dietitian for more information.   This information is not intended to replace advice given to you by your health care provider. Make sure you discuss any questions you have with your health care provider.   Document Released: 08/20/2011 Document Revised: 06/02/2013 Document Reviewed: 04/01/2013 Elsevier Interactive Patient Education 2016  [employer].

## 2015-03-17 ENCOUNTER — Other Ambulatory Visit: Payer: Self-pay | Admitting: Family Medicine

## 2015-03-17 DIAGNOSIS — R197 Diarrhea, unspecified: Principal | ICD-10-CM

## 2015-03-17 DIAGNOSIS — R112 Nausea with vomiting, unspecified: Secondary | ICD-10-CM

## 2015-03-17 DIAGNOSIS — R52 Pain, unspecified: Secondary | ICD-10-CM

## 2015-03-18 NOTE — Progress Notes (Signed)
Yep sure did. No need to call pt.

## 2015-03-25 ENCOUNTER — Ambulatory Visit
Admission: RE | Admit: 2015-03-25 | Discharge: 2015-03-25 | Disposition: A | Discharge status: Home / Self Care | Payer: BLUE CROSS/BLUE SHIELD | Source: Ambulatory Visit | Attending: Family Medicine | Admitting: Family Medicine

## 2015-03-25 ENCOUNTER — Other Ambulatory Visit: Payer: BLUE CROSS/BLUE SHIELD

## 2015-03-25 DIAGNOSIS — R1084 Generalized abdominal pain: Secondary | ICD-10-CM

## 2015-04-07 ENCOUNTER — Encounter: Payer: Self-pay | Admitting: Family Medicine

## 2015-04-07 ENCOUNTER — Telehealth: Payer: Self-pay | Admitting: Family Medicine

## 2015-04-07 ENCOUNTER — Encounter: Payer: BLUE CROSS/BLUE SHIELD | Admitting: Family Medicine

## 2015-04-07 ENCOUNTER — Ambulatory Visit (INDEPENDENT_AMBULATORY_CARE_PROVIDER_SITE_OTHER): Payer: BLUE CROSS/BLUE SHIELD | Admitting: Family Medicine

## 2015-04-07 VITALS — BP 138/84 | HR 86 | Temp 97.9°F | Resp 16 | Ht 65.5 in | Wt 153.8 lb

## 2015-04-07 DIAGNOSIS — R1012 Left upper quadrant pain: Secondary | ICD-10-CM | POA: Diagnosis not present

## 2015-04-07 DIAGNOSIS — Z113 Encounter for screening for infections with a predominantly sexual mode of transmission: Secondary | ICD-10-CM

## 2015-04-07 DIAGNOSIS — B353 Tinea pedis: Secondary | ICD-10-CM | POA: Diagnosis not present

## 2015-04-07 DIAGNOSIS — B3731 Acute candidiasis of vulva and vagina: Secondary | ICD-10-CM

## 2015-04-07 DIAGNOSIS — B373 Candidiasis of vulva and vagina: Secondary | ICD-10-CM

## 2015-04-07 DIAGNOSIS — E786 Lipoprotein deficiency: Secondary | ICD-10-CM | POA: Diagnosis not present

## 2015-04-07 DIAGNOSIS — E559 Vitamin D deficiency, unspecified: Secondary | ICD-10-CM

## 2015-04-07 DIAGNOSIS — R5383 Other fatigue: Secondary | ICD-10-CM

## 2015-04-07 DIAGNOSIS — R946 Abnormal results of thyroid function studies: Secondary | ICD-10-CM

## 2015-04-07 DIAGNOSIS — G8929 Other chronic pain: Secondary | ICD-10-CM | POA: Diagnosis not present

## 2015-04-07 DIAGNOSIS — R109 Unspecified abdominal pain: Secondary | ICD-10-CM

## 2015-04-07 DIAGNOSIS — R9389 Abnormal findings on diagnostic imaging of other specified body structures: Secondary | ICD-10-CM

## 2015-04-07 LAB — LIPID PANEL
CHOLESTEROL: 172 mg/dL (ref 125–200)
HDL: 33 mg/dL — ABNORMAL LOW (ref >=46)
LDL Cholesterol: 118 mg/dL (ref <130)
Total CHOL/HDL Ratio: 5.2 Ratio — ABNORMAL HIGH (ref <=5.0)
Triglycerides: 103 mg/dL (ref <150)
VLDL: 21 mg/dL (ref <30)

## 2015-04-07 LAB — FERRITIN: Ferritin: 48 ng/mL (ref 10–291)

## 2015-04-07 LAB — THYROID PANEL WITH TSH
FREE THYROXINE INDEX: 2.4 (ref 1.4–3.8)
T3 UPTAKE: 29 % (ref 22–35)
T4 TOTAL: 8.2 ug/dL (ref 4.5–12.0)
TSH: 2.208 u[IU]/mL (ref 0.350–4.500)

## 2015-04-07 LAB — HEPATITIS C ANTIBODY: HCV AB: NEGATIVE

## 2015-04-07 LAB — VITAMIN B12: VITAMIN B 12: 1005 pg/mL — AB (ref 211–911)

## 2015-04-07 LAB — HIV ANTIBODY (ROUTINE TESTING W REFLEX): HIV: NONREACTIVE

## 2015-04-07 LAB — RPR

## 2015-04-07 MED ORDER — FLUCONAZOLE 150 MG PO TABS: 150.0000 mg | ORAL_TABLET | ORAL | Status: DC

## 2015-04-07 MED ORDER — OMEPRAZOLE 40 MG PO CPDR: 40.0000 mg | DELAYED_RELEASE_CAPSULE | Freq: Every day | ORAL | Status: DC

## 2015-04-07 MED ORDER — CYCLOBENZAPRINE HCL 10 MG PO TABS: 10.0000 mg | ORAL_TABLET | Freq: Every evening | ORAL | Status: DC | PRN

## 2015-04-07 NOTE — Progress Notes (Deleted)
   Subjective:    Patient ID: [name], female    DOB: 04-Oct-1965, 49 y.o.   MRN: [id number]  HPI [name] is a delightful woman who has had months of GI and systemic sxs.  3 wks prior she was diagnosed with a UTI, BV, and with + H. pyorli gastritis both of which she has completed antibiotics for.  She was initially on cipro and flagyl, then followed by amox and biaxin. Still on ppi as pt was reporting coffee-ground emesis along with epigastric pain c/w bleeding PUD.  Pt had an abd [place] which showed a small 91mm hemangioma in the right lobe of her lives and a 4 mm nonobstructing right midpole kiidney stone - no h//o nephrolithiasis prior Pt did see GI [name] prior with a nml upper endoscopy  In 2005 - epigastric pain, n/v due to functional vs nsaid inj.    Review of Systems     Objective:   Physical Exam        Assessment & Plan:  If fasting, check lipids Prev saw [name] w/ gyn - last mam 2008

## 2015-04-07 NOTE — Telephone Encounter (Signed)
Reminded her of her appt at nine with Dr Brigitte Pulse

## 2015-04-07 NOTE — Progress Notes (Signed)
This encounter was created in error - please disregard.

## 2015-04-07 NOTE — Progress Notes (Signed)
Subjective:    Patient ID: [name], female    DOB: 1965/07/14, 49 y.o.   MRN: [id number] Chief Complaint  Patient presents with  . follow up last visit results from ultrasound  . vaginal itching from antibiotics  . hands and feet iching    HPI  [name] is a delightful woman who has had months of GI and systemic sxs.  3 wks prior she was diagnosed with a UTI, BV, and with + H. pyorli gastritis both of which she has completed antibiotics for.  She was initially on cipro and flagyl, then followed by amox and biaxin. Still on ppi as pt was reporting coffee-ground emesis along with epigastric pain c/w bleeding PUD.  Pt had an abd [place] which showed a small 96mm hemangioma in the right lobe of her lives and a 4 mm nonobstructing right midpole kiidney stone - no h//o nephrolithiasis prior Pt did see GI [name] prior with a nml upper endoscopy  In 2005 - epigastric pain, n/v due to functional vs nsaid inj.  Ever since she did the antibiotics, she has had itching of her palms and soles.  Sharp back pains occasionally - stabbing pain very severe that strikes unexpectedly and is quickly relieved - for months to years occuring in the lower left flank area.  Can't explain further - none on right or central.  She thought it might be due to to much soda and did improve when she has increased water and cranberry juice.  Episodes occur regularly - a several every few days but are not grouped together.  Gets better with stretching, position change, self-massage.  Feels similar to gas pains. Can't tell a difference with nsaids.  Nml GI/GU, no further abd pains. No further emesis.  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  She is the sole provider for her family.  No h/o kidney stones prior.  Incidentally found to have a right mid-pole stone, no urinary sxs.  Still c/o severe fatigue, does nap intermittently. Does get very tired while driving. She never wakes up  well-rested even if she sleeps an adequate number of course.  She works 10 1/2 hrs everyday driving around in circles which is very boring.  She is on vacation this week and is still waking up tired. No snoring or apnea sxs HAs still intermittent   Does not remember her last pap smear and no prior h/o abnml.  History reviewed. No pertinent past medical history. Past Surgical History  Procedure Laterality Date  . Cesarean section    . Abdominal hysterectomy     Current Outpatient Prescriptions on File Prior to Visit  Medication Sig Dispense Refill  . cyclobenzaprine (FLEXERIL) 10 MG tablet Take 1 tablet (10 mg total) by mouth at bedtime as needed for muscle spasms. 30 tablet 0  . fluconazole (DIFLUCAN) 150 MG tablet Take 1 tablet (150 mg total) by mouth once a week. Repeat if needed 6 tablet 0  . omeprazole (PRILOSEC) 40 MG capsule Take 1 capsule (40 mg total) by mouth daily. 30 capsule 1   No current facility-administered medications on file prior to visit.   Allergies  Allergen Reactions  . Hydrocodone Itching   Family History  Problem Relation Age of Onset  . Hypertension Other   . Diabetes Other    Social History   Social History  . Marital Status: Single    Spouse Name: N/A  . Number of Children:  N/A  . Years of Education: N/A   Social History Main Topics  . Smoking status: Current Every Day Smoker -- 1.00 packs/day for 20 years    Types: Cigarettes  . Smokeless tobacco: None  . Alcohol Use: No  . Drug Use: No  . Sexual Activity: Yes    Birth Control/ Protection: None   Other Topics Concern  . None   Social History Narrative     Review of Systems  Constitutional: Positive for fatigue.  HENT: Negative for trouble swallowing.   Respiratory: Negative for apnea.   Gastrointestinal: Negative for nausea, vomiting, abdominal pain, diarrhea and constipation.  Genitourinary: Positive for flank pain and vaginal discharge. Negative for dysuria, urgency, frequency,  hematuria, vaginal bleeding, difficulty urinating and vaginal pain.  Musculoskeletal: Positive for myalgias, back pain and arthralgias. Negative for joint swelling and gait problem.  Skin: Positive for rash. Negative for color change and wound.  Neurological: Positive for headaches.  Psychiatric/Behavioral: Positive for sleep disturbance.       Objective:  BP 138/84 mmHg  Pulse 86  Temp(Src) 97.9 F (36.6 C) (Oral)  Resp 16  Ht 5' 5.5" (1.664 m)  Wt 153 lb 12.8 oz (69.763 kg)  BMI 25.20 kg/m2  Physical Exam  Constitutional: She is oriented to person, place, and time. She appears well-developed and well-nourished. No distress.  HENT:  Head: Normocephalic and atraumatic.  Right Ear: External ear normal.  Left Ear: External ear normal.  Eyes: Conjunctivae are normal. No scleral icterus.  Neck: Normal range of motion. Neck supple. No thyromegaly present.  Cardiovascular: Normal rate, regular rhythm, normal heart sounds and intact distal pulses.   Pulmonary/Chest: Effort normal and breath sounds normal. No respiratory distress.  Musculoskeletal: She exhibits no edema.  Lymphadenopathy:    She has no cervical adenopathy.  Neurological: She is alert and oriented to person, place, and time.  Skin: Skin is warm and dry. She is not diaphoretic. No erythema.  Psychiatric: She has a normal mood and affect. Her behavior is normal.          Assessment & Plan:   Over 40 min spent in face-to-face evaluation of and consultation with patient and coordination of care.  Over 50% of this time was spent counseling this patient.  sched for CPE with pap in 3 mos  1. Other fatigue - Consider need for sleep study  2. Abnormal ultrasound of thyroid gland   3. Screening for STD (sexually transmitted disease)   4. Vaginal moniliasis  - understandably has sxs of vag yeast as s/p courses of 4 abx - Start fluconazole 150 qwk x 6 weeks  5. Tinea pedis of both feet   6. Left flank pain, chronic - Try  cyclobenzaprine qhs. Heat, stretching, self tennis ball massage. If sxs cont could consider further imagine and/or PT  7. Vitamin D deficiency   8. Low HDL (under 40)     Orders Placed This Encounter  Procedures  . US Soft Tissue Head/Neck    Lbs/needs/Ins/pt w/epic order    Standing Status: Future     Number of Occurrences:      Standing Expiration Date: 06/06/2016    Order Specific Question:  Reason for Exam (SYMPTOM  OR DIAGNOSIS REQUIRED)    Answer:  f/u Rt lobe enlargement and cysts seen in 2010    Order Specific Question:  Preferred imaging location?    Answer:  [facility]  . Vitamin B12  . Vit D  25 hydroxy (rtn  osteoporosis monitoring)  . Lipid panel    Order Specific Question:  Has the patient fasted?    Answer:  Yes  . Ferritin  . Thyroid Panel With TSH  . HIV antibody  . Hepatitis C Ab Reflex HCV RNA, QUANT  . RPR  . Hepatitis C antibody    Meds ordered this encounter  Medications  . ergocalciferol (VITAMIN D2) 50000 UNITS capsule    Sig: Take 1 capsule (50,000 Units total) by mouth once a week.    Dispense:  12 capsule    Refill:  1     [name], MD MPH

## 2015-04-08 LAB — VITAMIN D 25 HYDROXY (VIT D DEFICIENCY, FRACTURES): VIT D 25 HYDROXY: 17 ng/mL — AB (ref 30–100)

## 2015-04-16 ENCOUNTER — Encounter: Payer: Self-pay | Admitting: Family Medicine

## 2015-04-16 DIAGNOSIS — E786 Lipoprotein deficiency: Secondary | ICD-10-CM | POA: Insufficient documentation

## 2015-04-16 DIAGNOSIS — E559 Vitamin D deficiency, unspecified: Secondary | ICD-10-CM | POA: Insufficient documentation

## 2015-04-16 MED ORDER — ERGOCALCIFEROL 1.25 MG (50000 UT) PO CAPS
50000.0000 [IU] | ORAL_CAPSULE | ORAL | Status: DC
Start: 2015-04-16 — End: 2016-07-12

## 2015-07-21 ENCOUNTER — Encounter: Payer: BLUE CROSS/BLUE SHIELD | Admitting: Family Medicine

## 2015-08-31 ENCOUNTER — Emergency Department (HOSPITAL_COMMUNITY)
Admission: EM | Admit: 2015-08-31 | Discharge: 2015-08-31 | Disposition: A | Discharge status: Home / Self Care | Payer: BLUE CROSS/BLUE SHIELD | Attending: Emergency Medicine | Admitting: Emergency Medicine

## 2015-08-31 ENCOUNTER — Emergency Department (HOSPITAL_COMMUNITY): Payer: BLUE CROSS/BLUE SHIELD

## 2015-08-31 ENCOUNTER — Emergency Department (HOSPITAL_COMMUNITY)
Admission: EM | Admit: 2015-08-31 | Discharge: 2015-08-31 | Disposition: A | Discharge status: Home / Self Care | Payer: BLUE CROSS/BLUE SHIELD

## 2015-08-31 ENCOUNTER — Encounter (HOSPITAL_COMMUNITY): Payer: Self-pay | Admitting: Emergency Medicine

## 2015-08-31 DIAGNOSIS — R109 Unspecified abdominal pain: Secondary | ICD-10-CM

## 2015-08-31 DIAGNOSIS — R110 Nausea: Secondary | ICD-10-CM | POA: Insufficient documentation

## 2015-08-31 DIAGNOSIS — Z79899 Other long term (current) drug therapy: Secondary | ICD-10-CM | POA: Insufficient documentation

## 2015-08-31 DIAGNOSIS — F17210 Nicotine dependence, cigarettes, uncomplicated: Secondary | ICD-10-CM | POA: Insufficient documentation

## 2015-08-31 DIAGNOSIS — R1032 Left lower quadrant pain: Secondary | ICD-10-CM | POA: Diagnosis not present

## 2015-08-31 LAB — URINALYSIS, ROUTINE W REFLEX MICROSCOPIC
BILIRUBIN URINE: NEGATIVE
Glucose, UA: NEGATIVE mg/dL
HGB URINE DIPSTICK: NEGATIVE
Ketones, ur: NEGATIVE mg/dL
Leukocytes, UA: NEGATIVE
Nitrite: NEGATIVE
PROTEIN: NEGATIVE mg/dL
Specific Gravity, Urine: 1.025 (ref 1.005–1.030)
pH: 6.5 (ref 5.0–8.0)

## 2015-08-31 MED ORDER — KETOROLAC TROMETHAMINE 60 MG/2ML IM SOLN
60.0000 mg | Freq: Once | INTRAMUSCULAR | Status: AC
  Administered 2015-08-31: 60 mg via INTRAMUSCULAR
  Filled 2015-08-31: qty 2

## 2015-08-31 MED ORDER — CYCLOBENZAPRINE HCL 10 MG PO TABS
10.0000 mg | ORAL_TABLET | Freq: Once | ORAL | Status: AC
  Administered 2015-08-31: 10 mg via ORAL
  Filled 2015-08-31: qty 1

## 2015-08-31 MED ORDER — NAPROXEN 375 MG PO TABS
375.0000 mg | ORAL_TABLET | Freq: Two times a day (BID) | ORAL | Status: DC
Start: 2015-08-31 — End: 2016-07-12

## 2015-08-31 MED ORDER — ORPHENADRINE CITRATE ER 100 MG PO TB12: 100.0000 mg | ORAL_TABLET | Freq: Two times a day (BID) | ORAL | Status: DC

## 2015-08-31 NOTE — ED Notes (Signed)
Patient here with complaints of left sided flank pain x3 days. Pain 10/10, radiating down back. Nausea, no vomiting.

## 2015-08-31 NOTE — ED Notes (Signed)
Patient transported to CT 

## 2015-08-31 NOTE — ED Provider Notes (Signed)
CSN: [id number]     Arrival date & time 08/31/15  1316 History   First MD Initiated Contact with Patient 08/31/15 1632     Chief Complaint  Patient presents with  . Flank Pain  . Nausea     (Consider location/radiation/quality/duration/timing/severity/associated sxs/prior Treatment) HPI Patient reports sudden severe left flank pain. Onset yesterday. She reports is very sharp. She states it feels kind of like labor pains. It radiates to her groin and down her back a little bit. Patient reports nausea with no vomiting. No weakness or numbness in the legs. No fever, cough, chest pain, shortness of breath, lower extremity swelling. Patient denies pain or burning with urination. She denies seeing blood in her urine. History reviewed. No pertinent past medical history. Past Surgical History  Procedure Laterality Date  . Cesarean section    . Abdominal hysterectomy     Family History  Problem Relation Age of Onset  . Hypertension Other   . Diabetes Other    Social History  Substance Use Topics  . Smoking status: Current Every Day Smoker -- 1.00 packs/day for 20 years    Types: Cigarettes  . Smokeless tobacco: None  . Alcohol Use: No   OB History    No data available     Review of Systems 10 Systems reviewed and are negative for acute change except as noted in the HPI.   Allergies  Hydrocodone  Home Medications   Prior to Admission medications   Medication Sig Start Date End Date Taking? Authorizing Provider  cyclobenzaprine (FLEXERIL) 10 MG tablet Take 1 tablet (10 mg total) by mouth at bedtime as needed for muscle spasms. 04/07/15   Shawnee Knapp, MD  ergocalciferol (VITAMIN D2) 50000 UNITS capsule Take 1 capsule (50,000 Units total) by mouth once a week. 04/16/15   Shawnee Knapp, MD  fluconazole (DIFLUCAN) 150 MG tablet Take 1 tablet (150 mg total) by mouth once a week. Repeat if needed 04/07/15   Shawnee Knapp, MD  naproxen (NAPROSYN) 375 MG tablet Take 1 tablet (375 mg total) by  mouth 2 (two) times daily. 08/31/15   Charlesetta Shanks, MD  omeprazole (PRILOSEC) 40 MG capsule Take 1 capsule (40 mg total) by mouth daily. 04/07/15   Shawnee Knapp, MD  orphenadrine (NORFLEX) 100 MG tablet Take 1 tablet (100 mg total) by mouth 2 (two) times daily. 08/31/15   Charlesetta Shanks, MD   BP 147/99 mmHg  Pulse 88  Temp(Src) 98.5 F (36.9 C) (Oral)  Resp 18  SpO2 99% Physical Exam  Constitutional: She is oriented to person, place, and time. She appears well-developed and well-nourished.  HENT:  Head: Normocephalic and atraumatic.  Eyes: EOM are normal. Pupils are equal, round, and reactive to light.  Neck: Neck supple.  Cardiovascular: Normal rate, regular rhythm, normal heart sounds and intact distal pulses.   Pulmonary/Chest: Effort normal and breath sounds normal.  Abdominal: Soft. Bowel sounds are normal. She exhibits no distension. There is tenderness.  Patient endorses left flank pain to percussion. Also endorses mild left lower quadrant tenderness to palpation.  Musculoskeletal: Normal range of motion. She exhibits no edema or tenderness.  Neurological: She is alert and oriented to person, place, and time. She has normal strength. Coordination normal. GCS eye subscore is 4. GCS verbal subscore is 5. GCS motor subscore is 6.  Skin: Skin is warm, dry and intact.  Psychiatric: She has a normal mood and affect.    ED Course  Procedures (including critical  care time) Labs Review Labs Reviewed  URINALYSIS, ROUTINE W REFLEX MICROSCOPIC (NOT AT [facility]) - Abnormal; Notable for the following:    APPearance CLOUDY (*)    All other components within normal limits    Imaging Review Ct Renal Stone Study  08/31/2015  CLINICAL DATA:  Left lower quadrant pain. Left flank pain. Symptoms for 2 days EXAM: CT ABDOMEN AND PELVIS WITHOUT CONTRAST TECHNIQUE: Multidetector CT imaging of the abdomen and pelvis was performed following the standard protocol without IV contrast. COMPARISON:  07/04/2009  FINDINGS: Stable tiny nodule in the left lower lobe on image 3. Pleural calcifications at the anterior right lung base are more prominent. No associated mass. Stable 3 mm calcification in the posterior right renal upper pole. No hydronephrosis. No definite ureteral calculus. Unenhanced liver, gallbladder, spleen, pancreas, adrenal glands are within normal limits Uterus is absent. Bladder is unremarkable. Adnexa are within normal limits No free-fluid.  No definite abnormal retroperitoneal adenopathy. Normal appendix. No focal bowel lesion.  No evidence of bowel obstruction. No vertebral compression deformity IMPRESSION: No evidence of ureteral obstruction. Small right renal calculus is noted. Electronically Signed   By: Marybelle Killings M.D.   On: 08/31/2015 17:28   I have personally reviewed and evaluated these images and lab results as part of my medical decision-making.   EKG Interpretation None      MDM   Final diagnoses:  Flank pain   Patient presents with left flank pain. CT does not show any stone present. Patient has no neurologic deficit. Abdominal examination is soft without guarding. Analysis shows no signs of infection. Patient has past medical history of abdominal hysterectomy. At this time findings are most consistent with muscular skeletal strain. Patient will be placed on naproxen and Norflex. She is instructed to follow-up with her family doctor next week.    Charlesetta Shanks, MD 08/31/15 [phone]

## 2015-08-31 NOTE — ED Notes (Signed)
EDP PFEIFER at bedside.

## 2015-08-31 NOTE — Discharge Instructions (Signed)
Flank Pain °Flank pain refers to pain that is located on the side of the body between the upper abdomen and the back. The pain may occur over a short period of time (acute) or may be long-term or reoccurring (chronic). It may be mild or severe. Flank pain can be caused by many things. °CAUSES  °Some of the more common causes of flank pain include: °· Muscle strains.   °· Muscle spasms.   °· A disease of your spine (vertebral disk disease).   °· A lung infection (pneumonia).   °· Fluid around your lungs (pulmonary edema).   °· A kidney infection.   °· Kidney stones.   °· A very painful skin rash caused by the chickenpox virus (shingles).   °· Gallbladder disease.   °HOME CARE INSTRUCTIONS  °Home care will depend on the cause of your pain. In general, °· Rest as directed by your caregiver. °· Drink enough fluids to keep your urine clear or pale yellow. °· Only take over-the-counter or prescription medicines as directed by your caregiver. Some medicines may help relieve the pain. °· Tell your caregiver about any changes in your pain. °· Follow up with your caregiver as directed. °SEEK IMMEDIATE MEDICAL CARE IF:  °· Your pain is not controlled with medicine.   °· You have new or worsening symptoms. °· Your pain increases.   °· You have abdominal pain.   °· You have shortness of breath.   °· You have persistent nausea or vomiting.   °· You have swelling in your abdomen.   °· You feel faint or pass out.   °· You have blood in your urine. °· You have a fever or persistent symptoms for more than 2-3 days. °· You have a fever and your symptoms suddenly get worse. °MAKE SURE YOU:  °· Understand these instructions. °· Will watch your condition. °· Will get help right away if you are not doing well or get worse. °  °This information is not intended to replace advice given to you by your health care provider. Make sure you discuss any questions you have with your health care provider. °  °Document Released: 07/19/2005 Document  Revised: 02/20/2012 Document Reviewed: 01/10/2012 °Elsevier Interactive Patient Education ©2016 [employer] ° °

## 2016-07-12 ENCOUNTER — Ambulatory Visit (INDEPENDENT_AMBULATORY_CARE_PROVIDER_SITE_OTHER): Payer: BLUE CROSS/BLUE SHIELD | Admitting: Family Medicine

## 2016-07-12 ENCOUNTER — Encounter: Payer: Self-pay | Admitting: Family Medicine

## 2016-07-12 VITALS — BP 135/84 | HR 102 | Temp 98.4°F | Resp 16 | Ht 65.5 in | Wt 152.0 lb

## 2016-07-12 DIAGNOSIS — F17200 Nicotine dependence, unspecified, uncomplicated: Secondary | ICD-10-CM | POA: Diagnosis not present

## 2016-07-12 DIAGNOSIS — R05 Cough: Secondary | ICD-10-CM

## 2016-07-12 DIAGNOSIS — J019 Acute sinusitis, unspecified: Secondary | ICD-10-CM

## 2016-07-12 DIAGNOSIS — R350 Frequency of micturition: Secondary | ICD-10-CM | POA: Diagnosis not present

## 2016-07-12 DIAGNOSIS — R059 Cough, unspecified: Secondary | ICD-10-CM

## 2016-07-12 LAB — POCT URINALYSIS DIP (MANUAL ENTRY)
BILIRUBIN UA: NEGATIVE
Blood, UA: NEGATIVE
Glucose, UA: NEGATIVE
Ketones, POC UA: NEGATIVE
NITRITE UA: NEGATIVE
PH UA: 7
Protein Ur, POC: NEGATIVE
Spec Grav, UA: 1.015
UROBILINOGEN UA: 4

## 2016-07-12 MED ORDER — ALBUTEROL SULFATE (2.5 MG/3ML) 0.083% IN NEBU
2.5000 mg | INHALATION_SOLUTION | Freq: Once | RESPIRATORY_TRACT | Status: AC
  Administered 2016-07-12: 2.5 mg via RESPIRATORY_TRACT

## 2016-07-12 MED ORDER — AMOXICILLIN 500 MG PO CAPS: 1000.0000 mg | ORAL_CAPSULE | Freq: Two times a day (BID) | ORAL | 0 refills | Status: DC

## 2016-07-12 MED ORDER — IPRATROPIUM BROMIDE 0.02 % IN SOLN
0.5000 mg | Freq: Once | RESPIRATORY_TRACT | Status: AC
  Administered 2016-07-12: 0.5 mg via RESPIRATORY_TRACT

## 2016-07-12 MED ORDER — BENZONATATE 100 MG PO CAPS: 100.0000 mg | ORAL_CAPSULE | Freq: Three times a day (TID) | ORAL | 0 refills | Status: DC | PRN

## 2016-07-12 MED ORDER — ALBUTEROL SULFATE 108 (90 BASE) MCG/ACT IN AEPB: 2.0000 | INHALATION_SPRAY | RESPIRATORY_TRACT | 2 refills | Status: DC | PRN

## 2016-07-12 NOTE — Progress Notes (Signed)
Subjective:    Patient ID: [name], female    DOB: [birth year], 51 y.o.   MRN: [id number] Chief Complaint  Patient presents with  . Cough    x 4 days  . Headache    x 4 days  . Sinusitis  . Generalized Body Aches    x 4 days    HPI  Pt started with URI sxs but now has progressed.  Has felt chilled but no fevers. + temp instability.  Now having a lot of sinus pressure and drainage of thick green mucous.  Has harsh cough and wheezing but not able to get stuff up. Has never used an inhaler prior.  Using sev otc cough/cold meds with minimal success.  Sleeping ok. 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  Eating a little - bacon and grits today. Just drinking oj.  + nausea and diarrhea.  Achy all over and worse than normal. Her daughter drove her here today.  Smoking 2/3 ppd.  Did not get the flu shot this year  History reviewed. No pertinent past medical history. No current outpatient prescriptions on file prior to visit.   No current facility-administered medications on file prior to visit.    Allergies  Allergen Reactions  . Hydrocodone Itching     Review of Systems  Constitutional: Positive for activity change, appetite change, chills and fatigue. Negative for diaphoresis and fever.  HENT: Positive for congestion, postnasal drip, rhinorrhea and sinus pressure. Negative for ear discharge, facial swelling, sore throat, trouble swallowing and voice change.   Respiratory: Positive for cough and wheezing. Negative for shortness of breath.   Cardiovascular: Negative for chest pain.  Gastrointestinal: Positive for diarrhea and nausea. Negative for abdominal pain and vomiting.  Genitourinary: Negative for dysuria.  Musculoskeletal: Positive for arthralgias, back pain, myalgias and neck stiffness. Negative for gait problem, joint swelling and neck pain.  Neurological: Positive for dizziness, weakness, light-headedness and headaches.  Negative for syncope.  Hematological: Negative for adenopathy.       Objective:   Physical Exam  Constitutional: She is oriented to person, place, and time. She appears well-developed and well-nourished. She appears lethargic. She appears ill. No distress.  HENT:  Head: Normocephalic and atraumatic.  Right Ear: External ear and ear canal normal. Tympanic membrane is retracted. A middle ear effusion is present.  Left Ear: External ear and ear canal normal. Tympanic membrane is retracted. A middle ear effusion is present.  Nose: Mucosal edema and rhinorrhea present. Right sinus exhibits maxillary sinus tenderness. Left sinus exhibits maxillary sinus tenderness.  Mouth/Throat: Uvula is midline and mucous membranes are normal. Posterior oropharyngeal erythema present. No oropharyngeal exudate, posterior oropharyngeal edema or tonsillar abscesses.  Green nasal discharge Rt>Lt  Eyes: Conjunctivae are normal. Right eye exhibits no discharge. Left eye exhibits no discharge. No scleral icterus.  Neck: Normal range of motion. Neck supple.  Cardiovascular: Normal rate, regular rhythm, normal heart sounds and intact distal pulses.   Pulmonary/Chest: Effort normal. She has wheezes in the right lower field and the left lower field.  Wheeze cleared after neb  Abdominal: Soft. Bowel sounds are normal. She exhibits no distension and no mass. There is no tenderness. There is no rebound and no guarding.  Lymphadenopathy:       Head (right side): Submandibular adenopathy present. No preauricular and no posterior auricular adenopathy present.       Head (left side): Submandibular adenopathy present. No preauricular  and no posterior auricular adenopathy present.    She has cervical adenopathy.       Right cervical: Superficial cervical adenopathy present.       Left cervical: Superficial cervical adenopathy present.       Right: No supraclavicular adenopathy present.       Left: No supraclavicular adenopathy  present.  Neurological: She is oriented to person, place, and time. She appears lethargic.  Skin: Skin is warm and dry. She is not diaphoretic. No erythema.  Psychiatric: She has a normal mood and affect. Her behavior is normal.      BP 135/84   Pulse (!) 102   Temp 98.4 F (36.9 C) (Oral)   Resp 16   Ht 5' 5.5" (1.664 m)   Wt 152 lb (68.9 kg)   SpO2 99%   BMI 24.91 kg/m     Results for orders placed or performed in visit on 07/12/16  POCT urinalysis dipstick  Result Value Ref Range   Color, UA yellow yellow   Clarity, UA clear clear   Glucose, UA negative negative   Bilirubin, UA negative negative   Ketones, POC UA negative negative   Spec Grav, UA 1.015    Blood, UA negative negative   pH, UA 7.0    Protein Ur, POC negative negative   Urobilinogen, UA 4.0    Nitrite, UA Negative Negative   Leukocytes, UA Trace (A) Negative   sxs improved after neb treatment in office Assessment & Plan:   1. Acute non-recurrent sinusitis, unspecified location   2. Urinary frequency   3. Tobacco use disorder   4. Cough   I suspect this might have started out as a viral URI or even the flu due to the multi-system involvement but exam now appears c/w sinusitis and sxs are still worsening on day 4  So will treat with amox. Encouraged smoking cessation Try prn albuterol. Work note given - ok to RTW in 5d  Orders Placed This Encounter  Procedures  . POCT urinalysis dipstick    Meds ordered this encounter  Medications  . Dextromethorphan-Guaifenesin (MUCINEX DM PO)    Sig: Take by mouth.  Marland Kitchen albuterol (PROVENTIL) (2.5 MG/3ML) 0.083% nebulizer solution 2.5 mg  . ipratropium (ATROVENT) nebulizer solution 0.5 mg  . Albuterol Sulfate (PROAIR RESPICLICK) 123XX123 (90 Base) MCG/ACT AEPB    Sig: Inhale 2 puffs into the lungs every 4 (four) hours as needed.    Dispense:  1 each    Refill:  2  . amoxicillin (AMOXIL) 500 MG capsule    Sig: Take 2 capsules (1,000 mg total) by mouth 2 (two)  times daily.    Dispense:  28 capsule    Refill:  0  . benzonatate (TESSALON) 100 MG capsule    Sig: Take 1-2 capsules (100-200 mg total) by mouth 3 (three) times daily as needed for cough.    Dispense:  60 capsule    Refill:  0      [name], M.D.  [facility] [address] [place], [place] [address] [phone] phone [phone] fax  07/12/16 7:08 PM

## 2016-07-12 NOTE — Patient Instructions (Addendum)
   IF you received an x-ray today, you will receive an invoice from [facility]. Please contact  Radiology at [phone] with questions or concerns regarding your invoice.   IF you received labwork today, you will receive an invoice from [facility]. Please contact [facility] at [phone] with questions or concerns regarding your invoice.   Our billing staff will not be able to assist you with questions regarding bills from these companies.  You will be contacted with the lab results as soon as they are available. The fastest way to get your results is to activate your My Chart account. Instructions are located on the last page of this paperwork. If you have not heard from us regarding the results in 2 weeks, please contact this office.      Sinusitis, Adult Sinusitis is soreness and inflammation of your sinuses. Sinuses are hollow spaces in the bones around your face. Your sinuses are located:  Around your eyes.  In the middle of your forehead.  Behind your nose.  In your cheekbones. Your sinuses and nasal passages are lined with a stringy fluid (mucus). Mucus normally drains out of your sinuses. When your nasal tissues become inflamed or swollen, the mucus can become trapped or blocked so air cannot flow through your sinuses. This allows bacteria, viruses, and funguses to grow, which leads to infection. Sinusitis can develop quickly and last for 7?10 days (acute) or for more than 12 weeks (chronic). Sinusitis often develops after a cold. What are the causes? This condition is caused by anything that creates swelling in the sinuses or stops mucus from draining, including:  Allergies.  Asthma.  Bacterial or viral infection.  Abnormally shaped bones between the nasal passages.  Nasal growths that contain mucus (nasal polyps).  Narrow sinus openings.  Pollutants, such as chemicals or irritants in the air.  A foreign object stuck in the nose.  A fungal  infection. This is rare. What increases the risk? The following factors may make you more likely to develop this condition:  Having allergies or asthma.  Having had a recent cold or respiratory tract infection.  Having structural deformities or blockages in your nose or sinuses.  Having a weak immune system.  Doing a lot of swimming or diving.  Overusing nasal sprays.  Smoking. What are the signs or symptoms? The main symptoms of this condition are pain and a feeling of pressure around the affected sinuses. Other symptoms include:  Upper toothache.  Earache.  Headache.  Bad breath.  Decreased sense of smell and taste.  A cough that may get worse at night.  Fatigue.  Fever.  Thick drainage from your nose. The drainage is often green and it may contain pus (purulent).  Stuffy nose or congestion.  Postnasal drip. This is when extra mucus collects in the throat or back of the nose.  Swelling and warmth over the affected sinuses.  Sore throat.  Sensitivity to light. How is this diagnosed? This condition is diagnosed based on symptoms, a medical history, and a physical exam. To find out if your condition is acute or chronic, your health care provider may:  Look in your nose for signs of nasal polyps.  Tap over the affected sinus to check for signs of infection.  View the inside of your sinuses using an imaging device that has a light attached (endoscope). If your health care provider suspects that you have chronic sinusitis, you may also:  Be tested for allergies.  Have a sample of   mucus taken from your nose (nasal culture) and checked for bacteria.  Have a mucus sample examined to see if your sinusitis is related to an allergy. If your sinusitis does not respond to treatment and it lasts longer than 8 weeks, you may have an MRI or CT scan to check your sinuses. These scans also help to determine how severe your infection is. In rare cases, a bone biopsy may  be done to rule out more serious types of fungal sinus disease. How is this treated? Treatment for sinusitis depends on the cause and whether your condition is chronic or acute. If a virus is causing your sinusitis, your symptoms will go away on their own within 10 days. You may be given medicines to relieve your symptoms, including:  Topical nasal decongestants. They shrink swollen nasal passages and let mucus drain from your sinuses.  Antihistamines. These drugs block inflammation that is triggered by allergies. This can help to ease swelling in your nose and sinuses.  Topical nasal corticosteroids. These are nasal sprays that ease inflammation and swelling in your nose and sinuses.  Nasal saline washes. These rinses can help to get rid of thick mucus in your nose. If your condition is caused by bacteria, you will be given an antibiotic medicine. If your condition is caused by a fungus, you will be given an antifungal medicine. Surgery may be needed to correct underlying conditions, such as narrow nasal passages. Surgery may also be needed to remove polyps. Follow these instructions at home: Medicines  Take, use, or apply over-the-counter and prescription medicines only as told by your health care provider. These may include nasal sprays.  If you were prescribed an antibiotic medicine, take it as told by your health care provider. Do not stop taking the antibiotic even if you start to feel better. Hydrate and Humidify  Drink enough water to keep your urine clear or pale yellow. Staying hydrated will help to thin your mucus.  Use a cool mist humidifier to keep the humidity level in your home above 50%.  Inhale steam for 10-15 minutes, 3-4 times a day or as told by your health care provider. You can do this in the bathroom while a hot shower is running.  Limit your exposure to cool or dry air. Rest  Rest as much as possible.  Sleep with your head raised (elevated).  Make sure to get  enough sleep each night. General instructions  Apply a warm, moist washcloth to your face 3-4 times a day or as told by your health care provider. This will help with discomfort.  Wash your hands often with soap and water to reduce your exposure to viruses and other germs. If soap and water are not available, use hand sanitizer.  Do not smoke. Avoid being around people who are smoking (secondhand smoke).  Keep all follow-up visits as told by your health care provider. This is important. Contact a health care provider if:  You have a fever.  Your symptoms get worse.  Your symptoms do not improve within 10 days. Get help right away if:  You have a severe headache.  You have persistent vomiting.  You have pain or swelling around your face or eyes.  You have vision problems.  You develop confusion.  Your neck is stiff.  You have trouble breathing. This information is not intended to replace advice given to you by your health care provider. Make sure you discuss any questions you have with your health care provider.  Document Released: 05/28/2005 Document Revised: 01/22/2016 Document Reviewed: 03/23/2015 Elsevier Interactive Patient Education  2017 Else[employer] Inc.  Smoking Hazards Smoking cigarettes is extremely bad for your health. Tobacco smoke has over 200 known poisons in it. It contains the poisonous gases nitrogen oxide and carbon monoxide. There are over 60 chemicals in tobacco smoke that cause cancer. Some of the chemicals found in cigarette smoke include:   Cyanide.   Benzene.   Formaldehyde.   Methanol (wood alcohol).   Acetylene (fuel used in welding torches).   Ammonia.  Even smoking lightly shortens your life expectancy by several years. You can greatly reduce the risk of medical problems for you and your family by stopping now. Smoking is the most preventable cause of death and disease in our society. Within days of quitting smoking, your circulation  improves, you decrease the risk of having a heart attack, and your lung capacity improves. There may be some increased phlegm in the first few days after quitting, and it may take months for your lungs to clear up completely. Quitting for 10 years reduces your risk of developing lung cancer to almost that of a nonsmoker.  WHAT ARE THE RISKS OF SMOKING? Cigarette smokers have an increased risk of many serious medical problems, including:  Lung cancer.   Lung disease (such as pneumonia, bronchitis, and emphysema).   Heart attack and chest pain due to the heart not getting enough oxygen (angina).   Heart disease and peripheral blood vessel disease.   Hypertension.   Stroke.   Oral cancer (cancer of the lip, mouth, or voice box).   Bladder cancer.   Pancreatic cancer.   Cervical cancer.   Pregnancy complications, including premature birth.   Stillbirths and smaller newborn babies, birth defects, and genetic damage to sperm.   Early menopause.   Lower estrogen level for women.   Infertility.   Facial wrinkles.   Blindness.   Increased risk of broken bones (fractures).   Senile dementia.   Stomach ulcers and internal bleeding.   Delayed wound healing and increased risk of complications during surgery. Because of secondhand smoke exposure, children of smokers have an increased risk of the following:   Sudden infant death syndrome (SIDS).   Respiratory infections.   Lung cancer.   Heart disease.   Ear infections.  WHY IS SMOKING ADDICTIVE? Nicotine is the chemical agent in tobacco that is capable of causing addiction or dependence. When you smoke and inhale, nicotine is absorbed rapidly into the bloodstream through your lungs. Both inhaled and noninhaled nicotine may be addictive.  WHAT ARE THE BENEFITS OF QUITTING?  There are many health benefits to quitting smoking. Some are:   The likelihood of developing cancer and heart disease  decreases. Health improvements are seen almost immediately.   Blood pressure, pulse rate, and breathing patterns start returning to normal soon after quitting.   People who quit may see an improvement in their overall quality of life.  HOW DO YOU QUIT SMOKING? Smoking is an addiction with both physical and psychological effects, and longtime habits can be hard to change. Your health care provider can recommend:  Programs and community resources, which may include group support, education, or therapy.  Replacement products, such as patches, gum, and nasal sprays. Use these products only as directed. Do not replace cigarette smoking with electronic cigarettes (commonly called e-cigarettes). The safety of e-cigarettes is unknown, and some may contain harmful chemicals. FOR MORE INFORMATION  American Lung Association: www.lung.[place] Society:  www.cancer.org This information is not intended to replace advice given to you by your health care provider. Make sure you discuss any questions you have with your health care provider. Document Released: 07/05/2004 Document Revised: 09/19/2015 Document Reviewed: 11/17/2012 Elsevier Interactive Patient Education  2017 [employer].

## 2016-07-17 ENCOUNTER — Ambulatory Visit: Payer: BLUE CROSS/BLUE SHIELD

## 2016-07-17 ENCOUNTER — Encounter: Payer: Self-pay | Admitting: *Deleted

## 2016-10-04 ENCOUNTER — Ambulatory Visit (INDEPENDENT_AMBULATORY_CARE_PROVIDER_SITE_OTHER): Payer: BLUE CROSS/BLUE SHIELD | Admitting: Family Medicine

## 2016-10-04 ENCOUNTER — Encounter: Payer: Self-pay | Admitting: Family Medicine

## 2016-10-04 VITALS — BP 140/84 | HR 84 | Temp 98.0°F | Resp 16 | Ht 65.0 in | Wt 154.0 lb

## 2016-10-04 DIAGNOSIS — A5901 Trichomonal vulvovaginitis: Secondary | ICD-10-CM | POA: Diagnosis not present

## 2016-10-04 DIAGNOSIS — K649 Unspecified hemorrhoids: Secondary | ICD-10-CM | POA: Diagnosis not present

## 2016-10-04 DIAGNOSIS — R030 Elevated blood-pressure reading, without diagnosis of hypertension: Secondary | ICD-10-CM | POA: Diagnosis not present

## 2016-10-04 DIAGNOSIS — R1084 Generalized abdominal pain: Secondary | ICD-10-CM

## 2016-10-04 DIAGNOSIS — N12 Tubulo-interstitial nephritis, not specified as acute or chronic: Secondary | ICD-10-CM | POA: Diagnosis not present

## 2016-10-04 DIAGNOSIS — N939 Abnormal uterine and vaginal bleeding, unspecified: Secondary | ICD-10-CM

## 2016-10-04 DIAGNOSIS — B3731 Acute candidiasis of vulva and vagina: Secondary | ICD-10-CM

## 2016-10-04 DIAGNOSIS — B373 Candidiasis of vulva and vagina: Secondary | ICD-10-CM | POA: Diagnosis not present

## 2016-10-04 LAB — POC MICROSCOPIC URINALYSIS (UMFC): MUCUS RE: ABSENT

## 2016-10-04 LAB — POCT CBC
GRANULOCYTE PERCENT: 47.3 % (ref 37–80)
HEMATOCRIT: 41.6 % (ref 37.7–47.9)
Hemoglobin: 14 g/dL (ref 12.2–16.2)
LYMPH, POC: 3.4 (ref 0.6–3.4)
MCH, POC: 30 pg (ref 27–31.2)
MCHC: 33.7 g/dL (ref 31.8–35.4)
MCV: 89 fL (ref 80–97)
MID (CBC): 0.4 (ref 0–0.9)
MPV: 8.7 fL (ref 0–99.8)
POC GRANULOCYTE: 3.4 (ref 2–6.9)
POC LYMPH %: 47.7 % (ref 10–50)
POC MID %: 5 %M (ref 0–12)
Platelet Count, POC: 363 10*3/uL (ref 142–424)
RBC: 4.67 M/uL (ref 4.04–5.48)
RDW, POC: 13.8 %
WBC: 7.2 10*3/uL (ref 4.6–10.2)

## 2016-10-04 LAB — POCT WET + KOH PREP: YEAST BY KOH: ABSENT

## 2016-10-04 LAB — POCT URINALYSIS DIP (MANUAL ENTRY)
Bilirubin, UA: NEGATIVE
Glucose, UA: NEGATIVE mg/dL
Ketones, POC UA: NEGATIVE mg/dL
Nitrite, UA: NEGATIVE
PROTEIN UA: NEGATIVE mg/dL
Spec Grav, UA: 1.015 (ref 1.010–1.025)
UROBILINOGEN UA: 0.2 U/dL
pH, UA: 6.5 (ref 5.0–8.0)

## 2016-10-04 LAB — HEMOCCULT GUIAC POC 1CARD (OFFICE): Fecal Occult Blood, POC: POSITIVE — AB

## 2016-10-04 MED ORDER — ONDANSETRON 8 MG PO TBDP: 8.0000 mg | ORAL_TABLET | Freq: Three times a day (TID) | ORAL | 0 refills | Status: DC | PRN

## 2016-10-04 MED ORDER — OMEPRAZOLE 40 MG PO CPDR: 40.0000 mg | DELAYED_RELEASE_CAPSULE | Freq: Two times a day (BID) | ORAL | 1 refills | Status: DC

## 2016-10-04 MED ORDER — HYDROCORTISONE ACETATE 25 MG RE SUPP: 25.0000 mg | Freq: Two times a day (BID) | RECTAL | 0 refills | Status: DC

## 2016-10-04 MED ORDER — GI COCKTAIL ~~LOC~~
30.0000 mL | Freq: Once | ORAL | Status: AC
  Administered 2016-10-04: 30 mL via ORAL

## 2016-10-04 MED ORDER — METRONIDAZOLE 500 MG PO TABS: 500.0000 mg | ORAL_TABLET | Freq: Two times a day (BID) | ORAL | 0 refills | Status: DC

## 2016-10-04 MED ORDER — FLUCONAZOLE 150 MG PO TABS: 150.0000 mg | ORAL_TABLET | Freq: Once | ORAL | 0 refills | Status: AC

## 2016-10-04 MED ORDER — CEFTRIAXONE SODIUM 1 G IJ SOLR
1.0000 g | Freq: Once | INTRAMUSCULAR | Status: AC
  Administered 2016-10-04: 1 g via INTRAMUSCULAR

## 2016-10-04 MED ORDER — CIPROFLOXACIN HCL 500 MG PO TABS: 500.0000 mg | ORAL_TABLET | Freq: Two times a day (BID) | ORAL | 0 refills | Status: DC

## 2016-10-04 MED ORDER — TRAMADOL HCL 50 MG PO TABS: 50.0000 mg | ORAL_TABLET | Freq: Four times a day (QID) | ORAL | 0 refills | Status: DC | PRN

## 2016-10-04 NOTE — Progress Notes (Signed)
Subjective:    Patient ID: [name], female    DOB: Jun 28, 1965, 51 y.o.   MRN: [id number] Chief Complaint  Patient presents with  . Abdominal Pain    x 3 days/ pt notice blood on tissue when she wipes  . Hemorrhoids    x 2wks    HPI  She thinks it is coming from her vaginal and it is on the tissue from the front. She woke up Tuesday morning - 2d ago - unable to stand up due to severity of abdominal pain.; History of positive H. pylori in October 2016. Patient not seen since  She having a BM every 2 days, no straining, not painful. No hemochezia, melena. She does feel something protruding when she wipes so thinks it must be a hemorrhoid but it is not painful. She does not think it is this that is bleeding due to the location of the blood.  Urine is yellow and is a normal amount but is having urgency for the past week with increased frequency.  Nocturia is 2-3x/night.    Cold intolerance at baseline. Unchanged, no diaphroesis Bilateral low back pain qhich got much worse on Tuesday - painful to straighten up at out o bbed. Abd pain is generalized but Rty>Lt Achy Vomiting on Tuesday. Never can eat antying greasy or spicy as has severe CP - like an MI.  No otc meds. Took a BC powder and one TUMS yesterday.   Pt did see GI Dr. Sharlett Iles prior with a nml upper endoscopy  In 2005 - epigastric pain, n/v due to functional vs nsaid inj. Oct 2016 she was diagnosed with a UTI, BV, and with + H. pyorli gastritis both of which she has completed antibiotics for.  She was initially on cipro and flagyl, then followed by amox and biaxin. Still on ppi as pt was reporting coffee-ground emesis along with epigastric pain c/w bleeding PUD.  Pt had an abd [place] which showed a small 37mm hemangioma in the right lobe of her lives and a 4 mm nonobstructing right midpole kiidney stone - no h//o nephrolithiasis prior.  Patient had a full hysterectomy for metromenorrhagia. Initially they took out her right  ovary and then had to go back and take out her left ovary as well due to recurrent ovarian cysts. she has had no vaginal bleeding since until now.   No past medical history on file. Past Surgical History:  Procedure Laterality Date  . ABDOMINAL HYSTERECTOMY    . CESAREAN SECTION     Current Outpatient Prescriptions on File Prior to Visit  Medication Sig Dispense Refill  . Dextromethorphan-Guaifenesin (MUCINEX DM PO) Take by mouth.     No current facility-administered medications on file prior to visit.    Allergies  Allergen Reactions  . Hydrocodone Itching   Family History  Problem Relation Age of Onset  . Hypertension Other   . Diabetes Other    Social History   Social History  . Marital status: Single    Spouse name: N/A  . Number of children: N/A  . Years of education: N/A   Social History Main Topics  . Smoking status: Current Every Day Smoker    Packs/day: 1.00    Years: 20.00    Types: Cigarettes  . Smokeless tobacco: Never Used  . Alcohol use No  . Drug use: No  . Sexual activity: Yes    Birth control/ protection: None   Other Topics Concern  . None   Social  History Narrative  . None   Depression screen [facility] 2/9 10/04/2016 07/12/2016 04/07/2015 03/16/2015 03/13/2015  Decreased Interest 0 0 0 0 0  Down, Depressed, Hopeless 0 0 0 0 0  PHQ - 2 Score 0 0 0 0 0    Review of Systems See hpi    Objective:   Physical Exam  Constitutional: She is oriented to person, place, and time. She appears well-developed and well-nourished. She appears lethargic. She has a sickly appearance. No distress.  HENT:  Head: Normocephalic and atraumatic.  Neck: Normal range of motion. Neck supple. No thyromegaly present.  Cardiovascular: Normal rate, regular rhythm, normal heart sounds and intact distal pulses.   Pulmonary/Chest: Effort normal and breath sounds normal. No respiratory distress.  Abdominal: Soft. Bowel sounds are normal. She exhibits no distension. There is  tenderness. There is no rebound, no guarding and no CVA tenderness.  Genitourinary: Rectal exam shows internal hemorrhoid, tenderness and guaiac positive stool. Rectal exam shows anal tone normal. Pelvic exam was performed with patient supine. There is no rash, tenderness or lesion on the right labia. There is no rash, tenderness or lesion on the left labia. Right adnexum displays no mass, no tenderness and no fullness. Left adnexum displays no mass, no tenderness and no fullness. No erythema or tenderness in the vagina. No vaginal discharge found.  Genitourinary Comments: Cervix and uterus surgically absent.  Musculoskeletal: She exhibits no edema.  Lymphadenopathy:    She has no cervical adenopathy.       Right: No inguinal adenopathy present.       Left: No inguinal adenopathy present.  Neurological: She is oriented to person, place, and time. She appears lethargic.  Skin: Skin is warm and dry. She is not diaphoretic. No erythema.  Psychiatric: She has a normal mood and affect. Her behavior is normal.      BP 140/84   Pulse 84   Temp 98 F (36.7 C) (Oral)   Resp 16   Ht 5\' 5"  (1.651 m)   Wt 154 lb (69.9 kg)   SpO2 100%   BMI 25.63 kg/m      Results for orders placed or performed in visit on 10/04/16  Urine culture  Result Value Ref Range   Urine Culture, Routine Final report (A)    Urine Culture result 1 Klebsiella pneumoniae (A)    ANTIMICROBIAL SUSCEPTIBILITY Comment   H. pylori breath test  Result Value Ref Range   H. pylori UBiT Negative Negative  Comprehensive metabolic panel  Result Value Ref Range   Glucose 83 65 - 99 mg/dL   BUN 11 6 - 24 mg/dL   Creatinine, Ser 0.76 0.57 - 1.00 mg/dL   GFR calc non Af Amer 92 >59 mL/min/1.73   GFR calc Af Amer 106 >59 mL/min/1.73   BUN/Creatinine Ratio 14 9 - 23   Sodium 138 134 - 144 mmol/L   Potassium 4.4 3.5 - 5.2 mmol/L   Chloride 99 96 - 106 mmol/L   CO2 23 18 - 29 mmol/L   Calcium 9.7 8.7 - 10.2 mg/dL   Total Protein  6.8 6.0 - 8.5 g/dL   Albumin 4.3 3.5 - 5.5 g/dL   Globulin, Total 2.5 1.5 - 4.5 g/dL   Albumin/Globulin Ratio 1.7 1.2 - 2.2   Bilirubin Total <0.2 0.0 - 1.2 mg/dL   Alkaline Phosphatase 93 39 - 117 IU/L   AST 22 0 - 40 IU/L   ALT 23 0 - 32 IU/L  Lipase  Result  Value Ref Range   Lipase 36 14 - 72 U/L  POCT urinalysis dipstick  Result Value Ref Range   Color, UA yellow yellow   Clarity, UA clear clear   Glucose, UA negative negative mg/dL   Bilirubin, UA negative negative   Ketones, POC UA negative negative mg/dL   Spec Grav, UA 1.015 1.010 - 1.025   Blood, UA trace-intact (A) negative   pH, UA 6.5 5.0 - 8.0   Protein Ur, POC negative negative mg/dL   Urobilinogen, UA 0.2 0.2 or 1.0 E.U./dL   Nitrite, UA Negative Negative   Leukocytes, UA Small (1+) (A) Negative  POCT CBC  Result Value Ref Range   WBC 7.2 4.6 - 10.2 K/uL   Lymph, poc 3.4 0.6 - 3.4   POC LYMPH PERCENT 47.7 10 - 50 %L   MID (cbc) 0.4 0 - 0.9   POC MID % 5.0 0 - 12 %M   POC Granulocyte 3.4 2 - 6.9   Granulocyte percent 47.3 37 - 80 %G   RBC 4.67 4.04 - 5.48 M/uL   Hemoglobin 14.0 12.2 - 16.2 g/dL   HCT, POC 41.6 37.7 - 47.9 %   MCV 89.0 80 - 97 fL   MCH, POC 30.0 27 - 31.2 pg   MCHC 33.7 31.8 - 35.4 g/dL   RDW, POC 13.8 %   Platelet Count, POC 363 142 - 424 K/uL   MPV 8.7 0 - 99.8 fL  POCT Wet + KOH Prep  Result Value Ref Range   Yeast by KOH Absent Absent   Yeast by wet prep Present (A) Absent   WBC by wet prep None (A) Few   Clue Cells Wet Prep HPF POC None None   Trich by wet prep Present (A) Absent   Bacteria Wet Prep HPF POC Many (A) Few   Epithelial Cells By [employer] Pref ([facility]) Moderate (A) None, Few, Too numerous to count   RBC,UR,HPF,POC None None RBC/hpf  POCT Microscopic Urinalysis ([facility])  Result Value Ref Range   WBC,UR,HPF,POC Many (A) None WBC/hpf   RBC,UR,HPF,POC None None RBC/hpf   Bacteria Too numerous to count  None, Too numerous to count   Mucus Absent Absent   Epithelial Cells, UR  Per Microscopy Moderate (A) None, Too numerous to count cells/hpf  POCT occult blood stool  Result Value Ref Range   Fecal Occult Blood, POC Positive (A) Negative   Card #1 Date 10/04/16    Card #2 Fecal Occult Blod, POC     Card #2 Date     Card #3 Fecal Occult Blood, POC     Card #3 Date    Pap IG, CT/NG w/ reflex HPV when ASC-U  Result Value Ref Range   DIAGNOSIS: Comment    Specimen adequacy: Comment    CLINICIAN PROVIDED ICD10: Comment    Performed by: Comment    PAP SMEAR COMMENT .    PATHOLOGIST PROVIDED ICD10: Comment    Note: Comment    Test Methodology Comment    PAP REFLEX: Comment    Chlamydia, Nuc. Acid Amp Negative Negative   Gonococcus by Nucleic Acid Amp Negative Negative    Assessment & Plan:  Refer to GI 1. Generalized abdominal pain - likely due to pyelo currently but pt has had recurrent complaints of abd pain for years which she reports did not resolve with H. Pylori treatment last yr so recheck and will refer to GI for further eval. Pt would also be a good  candidate for hemorrhoid banding I suspect and is due for an initial screening colonoscopy.  2. Elevated blood pressure reading - elev today as pt in sig pain, feels ill but has been borderline at prior visits - low threshold for starting bp med in future  3. Hemorrhoids, unspecified hemorrhoid type - start hydrocortisone supp and refer to GI. Not thrombosed.  4. Vaginal bleeding - pap done today of vaginal cuff but exam is vag normal with + inflammed internal prolapsed hemorrhoids and guaiac positive stool so I feel confident the bleeding was rectal rather than vaginal.  5. Pyelonephritis - - cipro  6. Trichomonal leukorrhea vaginalis - flagyl. Have partner treated before resuming sexual activity. Recheck at next OV for clearance  7. Vaginal candidiasis - diflucan now and repeat after antibiotic course    Orders Placed This Encounter  Procedures  . Urine culture  . H. pylori breath test  . Comprehensive  metabolic panel  . Lipase  . Ambulatory referral to Gastroenterology    Referral Priority:   Routine    Referral Type:   Consultation    Referral Reason:   Specialty Services Required    Number of Visits Requested:   1  . POCT urinalysis dipstick  . POCT CBC  . POCT Wet + KOH Prep  . POCT Microscopic Urinalysis ([facility])  . POCT occult blood stool    Meds ordered this encounter  Medications  . gi cocktail (Maalox,Lidocaine,Donnatal)  . cefTRIAXone (ROCEPHIN) injection 1 g  . metroNIDAZOLE (FLAGYL) 500 MG tablet    Sig: Take 1 tablet (500 mg total) by mouth 2 (two) times daily.    Dispense:  14 tablet    Refill:  0  . ciprofloxacin (CIPRO) 500 MG tablet    Sig: Take 1 tablet (500 mg total) by mouth 2 (two) times daily.    Dispense:  14 tablet    Refill:  0  . fluconazole (DIFLUCAN) 150 MG tablet    Sig: Take 1 tablet (150 mg total) by mouth once. Now. Repeat in 1 week after antibiotic course if completed.    Dispense:  2 tablet    Refill:  0  . hydrocortisone (ANUSOL-HC) 25 MG suppository    Sig: Place 1 suppository (25 mg total) rectally 2 (two) times daily.    Dispense:  24 suppository    Refill:  0  . omeprazole (PRILOSEC) 40 MG capsule    Sig: Take 1 capsule (40 mg total) by mouth 2 (two) times daily. 30 minutes prior to breakfast and dinner    Dispense:  60 capsule    Refill:  1  . ondansetron (ZOFRAN-ODT) 8 MG disintegrating tablet    Sig: Take 1 tablet (8 mg total) by mouth every 8 (eight) hours as needed for nausea.    Dispense:  20 tablet    Refill:  0  . traMADol (ULTRAM) 50 MG tablet    Sig: Take 1-2 tablets (50-100 mg total) by mouth every 6 (six) hours as needed.    Dispense:  40 tablet    Refill:  0    [name], M.D.  Primary Care at [facility] [address] [place], [place] [address] [phone] phone [phone] fax  10/07/16 9:15 AM

## 2016-10-04 NOTE — Patient Instructions (Addendum)
IF you received an x-ray today, you will receive an invoice from [facility]. Please contact [facility] at [phone] with questions or concerns regarding your invoice.   IF you received labwork today, you will receive an invoice from [facility]. Please contact [facility] at [phone] with questions or concerns regarding your invoice.   Our billing staff will not be able to assist you with questions regarding bills from these companies.  You will be contacted with the lab results as soon as they are available. The fastest way to get your results is to activate your My Chart account. Instructions are located on the last page of this paperwork. If you have not heard from [place] regarding the results in 2 weeks, please contact this office.     Trichomoniasis Trichomoniasis is an STI (sexually transmitted infection) that can affect both women and men. In women, the outer area of the female genitalia (vulva) and the vagina are affected. In men, the penis is mainly affected, but the prostate and other reproductive organs can also be involved. This condition can be treated with medicine. It often has no symptoms (is asymptomatic), especially in men. What are the causes? This condition is caused by an organism called Trichomonas vaginalis. Trichomoniasis most often spreads from person to person (is contagious) through sexual contact. What increases the risk? The following factors may make you more likely to develop this condition:  Having unprotected sexual intercourse.  Having sexual intercourse with a partner who has trichomoniasis.  Having multiple sexual partners.  Having had previous trichomoniasis infections or other STIs. What are the signs or symptoms? In women, symptoms of trichomoniasis include:  Abnormal vaginal discharge that is clear, white, gray, or yellow-green and foamy and has an unusual "fishy" odor.  Itching and irritation of the vagina and  vulva.  Burning or pain during urination or sexual intercourse.  Genital redness and swelling. In men, symptoms of trichomoniasis include:  Penile discharge that may be foamy or contain pus.  Pain in the penis. This may happen only when urinating.  Itching or irritation inside the penis.  Burning after urination or ejaculation. How is this diagnosed? In women, this condition may be found during a routine Pap test or physical exam. It may be found in men during a routine physical exam. Your health care provider may perform tests to help diagnose this infection, such as:  Urine tests (men and women).  The following in women:  Testing the pH of the vagina.  A vaginal swab test that checks for the Trichomonas vaginalis organism.  Testing vaginal secretions. Your health care provider may test you for other STIs, including HIV (human immunodeficiency virus). How is this treated? This condition is treated with medicine taken by mouth (orally), such as metronidazole or tinidazole to fight the infection. Your sexual partner(s) may also need to be tested and treated.  If you are a woman and you plan to become pregnant or think you may be pregnant, tell your health care provider right away. Some medicines that are used to treat the infection should not be taken during pregnancy. Your health care provider may recommend over-the-counter medicines or creams to help relieve itching or irritation. You may be tested for infection again 3 months after treatment. Follow these instructions at home:  Take and use over-the-counter and prescription medicines, including creams, only as told by your health care provider.  Do not have sexual intercourse until one week after you finish your medicine, or until  your health care provider approves. Ask your health care provider when you may resume sexual intercourse.  (Women) Do not douche or wear tampons while you have the infection.  Discuss your  infection with your sexual partner(s). Make sure that your partner gets tested and treated, if necessary.  Keep all follow-up visits as told by your health care provider. This is important. How is this prevented?  Use condoms every time you have sex. Using condoms correctly and consistently can help protect against STIs.  Avoid having multiple sexual partners.  Talk with your sexual partner about any symptoms that either of you may have, as well as any history of STIs.  Get tested for STIs and STDs (sexually transmitted diseases) before you have sex. Ask your partner to do the same.  Do not have sexual contact if you have symptoms of trichomoniasis or another STI. Contact a health care provider if:  You still have symptoms after you finish your medicine.  You develop pain in your abdomen.  You have pain when you urinate.  You have bleeding after sexual intercourse.  You develop a rash.  You feel nauseous or you vomit.  You plan to become pregnant or think you may be pregnant. Summary  Trichomoniasis is an STI (sexually transmitted infection) that can affect both women and men.  This condition often has no symptoms (is asymptomatic), especially in men.  You should not have sexual intercourse until one week after you finish your medicine, or until your health care provider approves. Ask your health care provider when you may resume sexual intercourse.  Discuss your infection with your sexual partner. Make sure that your partner gets tested and treated, if necessary. This information is not intended to replace advice given to you by your health care provider. Make sure you discuss any questions you have with your health care provider. Document Released: 11/21/2000 Document Revised: 04/20/2016 Document Reviewed: 04/20/2016 Elsevier Interactive Patient Education  2017 [employer]  Pyelonephritis, Adult Pyelonephritis is a kidney infection. The kidneys are the organs that  filter a person's blood and move waste out of the bloodstream and into the urine. Urine passes from the kidneys, through the ureters, and into the bladder. There are two main types of pyelonephritis:  Infections that come on quickly without any warning (acute pyelonephritis).  Infections that last for a long period of time (chronic pyelonephritis). In most cases, the infection clears up with treatment and does not cause further problems. More severe infections or chronic infections can sometimes spread to the bloodstream or lead to other problems with the kidneys. What are the causes? This condition is usually caused by:  Bacteria traveling from the bladder to the kidney through infected urine. The urine in the bladder can become infected with bacteria from:  Bladder infection (cystitis).  Inflammation of the prostate gland (prostatitis).  Sexual intercourse, in females.  Bacteria traveling from the bloodstream to the kidney. What increases the risk? This condition is more likely to develop in:  Pregnant women.  Older people.  People who have diabetes.  People who have kidney stones or bladder stones.  People who have other abnormalities of the kidney or ureter.  People who have a catheter placed in the bladder.  People who have cancer.  People who are sexually active.  Women who use spermicides.  People who have had a prior urinary tract infection. What are the signs or symptoms? Symptoms of this condition include:  Frequent urination.  Strong or persistent urge  to urinate.  Burning or stinging when urinating.  Abdominal pain.  Back pain.  Pain in the side or flank area.  Fever.  Chills.  Blood in the urine, or dark urine.  Nausea.  Vomiting. How is this diagnosed? This condition may be diagnosed based on:  Medical history and physical exam.  Urine tests.  Blood tests. You may also have imaging tests of the kidneys, such as an ultrasound or CT  scan. How is this treated? Treatment for this condition may depend on the severity of the infection.  If the infection is mild and is found early, you may be treated with antibiotic medicines taken by mouth. You will need to drink fluids to remain hydrated.  If the infection is more severe, you may need to stay in the hospital and receive antibiotics given directly into a vein through an IV tube. You may also need to receive fluids through an IV tube if you are not able to remain hydrated. After your hospital stay, you may need to take oral antibiotics for a period of time. Other treatments may be required, depending on the cause of the infection. Follow these instructions at home: Medicines   Take over-the-counter and prescription medicines only as told by your health care provider.  If you were prescribed an antibiotic medicine, take it as told by your health care provider. Do not stop taking the antibiotic even if you start to feel better. General instructions   Drink enough fluid to keep your urine clear or pale yellow.  Avoid caffeine, tea, and carbonated beverages. They tend to irritate the bladder.  Urinate often. Avoid holding in urine for long periods of time.  Urinate before and after sex.  After a bowel movement, women should cleanse from front to back. Use each tissue only once.  Keep all follow-up visits as told by your health care provider. This is important. Contact a health care provider if:  Your symptoms do not get better after 2 days of treatment.  Your symptoms get worse.  You have a fever. Get help right away if:  You are unable to take your antibiotics or fluids.  You have shaking chills.  You vomit.  You have severe flank or back pain.  You have extreme weakness or fainting. This information is not intended to replace advice given to you by your health care provider. Make sure you discuss any questions you have with your health care  provider. Document Released: 05/28/2005 Document Revised: 11/03/2015 Document Reviewed: 09/20/2014 Elsevier Interactive Patient Education  2017 Else[employer] Inc.   Vaginal Yeast infection, Adult Vaginal yeast infection is a condition that causes soreness, swelling, and redness (inflammation) of the vagina. It also causes vaginal discharge. This is a common condition. Some women get this infection frequently. What are the causes? This condition is caused by a change in the normal balance of the yeast (candida) and bacteria that live in the vagina. This change causes an overgrowth of yeast, which causes the inflammation. What increases the risk? This condition is more likely to develop in:  Women who take antibiotic medicines.  Women who have diabetes.  Women who take birth control pills.  Women who are pregnant.  Women who douche often.  Women who have a weak defense (immune) system.  Women who have been taking steroid medicines for a long time.  Women who frequently wear tight clothing. What are the signs or symptoms? Symptoms of this condition include:  White, thick vaginal discharge.  Swelling,  itching, redness, and irritation of the vagina. The lips of the vagina (vulva) may be affected as well.  Pain or a burning feeling while urinating.  Pain during sex. How is this diagnosed? This condition is diagnosed with a medical history and physical exam. This will include a pelvic exam. Your health care provider will examine a sample of your vaginal discharge under a microscope. Your health care provider may send this sample for testing to confirm the diagnosis. How is this treated? This condition is treated with medicine. Medicines may be over-the-counter or prescription. You may be told to use one or more of the following:  Medicine that is taken orally.  Medicine that is applied as a cream.  Medicine that is inserted directly into the vagina (suppository). Follow these  instructions at home:  Take or apply over-the-counter and prescription medicines only as told by your health care provider.  Do not have sex until your health care provider has approved. Tell your sex partner that you have a yeast infection. That person should go to his or her health care provider if he or she develops symptoms.  Do not wear tight clothes, such as pantyhose or tight pants.  Avoid using tampons until your health care provider approves.  Eat more yogurt. This may help to keep your yeast infection from returning.  Try taking a sitz bath to help with discomfort. This is a warm water bath that is taken while you are sitting down. The water should only come up to your hips and should cover your buttocks. Do this 3-4 times per day or as told by your health care provider.  Do not douche.  Wear breathable, cotton underwear.  If you have diabetes, keep your blood sugar levels under control. Contact a health care provider if:  You have a fever.  Your symptoms go away and then return.  Your symptoms do not get better with treatment.  Your symptoms get worse.  You have new symptoms.  You develop blisters in or around your vagina.  You have blood coming from your vagina and it is not your menstrual period.  You develop pain in your abdomen. This information is not intended to replace advice given to you by your health care provider. Make sure you discuss any questions you have with your health care provider. Document Released: 03/07/2005 Document Revised: 11/09/2015 Document Reviewed: 11/29/2014 Elsevier Interactive Patient Education  2017 [employer].  Hemorrhoids Hemorrhoids are swollen veins in and around the rectum or anus. There are two types of hemorrhoids:  Internal hemorrhoids. These occur in the veins that are just inside the rectum. They may poke through to the outside and become irritated and painful.  External hemorrhoids. These occur in the veins that  are outside of the anus and can be felt as a painful swelling or hard lump near the anus. Most hemorrhoids do not cause serious problems, and they can be managed with home treatments such as diet and lifestyle changes. If home treatments do not help your symptoms, procedures can be done to shrink or remove the hemorrhoids. What are the causes? This condition is caused by increased pressure in the anal area. This pressure may result from various things, including:  Constipation.  Straining to have a bowel movement.  Diarrhea.  Pregnancy.  Obesity.  Sitting for long periods of time.  Heavy lifting or other activity that causes you to strain.  Anal sex. What are the signs or symptoms? Symptoms of this condition include:  Pain.  Anal itching or irritation.  Rectal bleeding.  Leakage of stool (feces).  Anal swelling.  One or more lumps around the anus. How is this diagnosed? This condition can often be diagnosed through a visual exam. Other exams or tests may also be done, such as:  Examination of the rectal area with a gloved hand (digital rectal exam).  Examination of the anal canal using a small tube (anoscope).  A blood test, if you have lost a significant amount of blood.  A test to look inside the colon (sigmoidoscopy or colonoscopy). How is this treated? This condition can usually be treated at home. However, various procedures may be done if dietary changes, lifestyle changes, and other home treatments do not help your symptoms. These procedures can help make the hemorrhoids smaller or remove them completely. Some of these procedures involve surgery, and others do not. Common procedures include:  Rubber band ligation. Rubber bands are placed at the base of the hemorrhoids to cut off the blood supply to them.  Sclerotherapy. Medicine is injected into the hemorrhoids to shrink them.  Infrared coagulation. A type of light energy is used to get rid of the  hemorrhoids.  Hemorrhoidectomy surgery. The hemorrhoids are surgically removed, and the veins that supply them are tied off.  Stapled hemorrhoidopexy surgery. A circular stapling device is used to remove the hemorrhoids and use staples to cut off the blood supply to them. Follow these instructions at home: Eating and drinking   Eat foods that have a lot of fiber in them, such as whole grains, beans, nuts, fruits, and vegetables. Ask your health care provider about taking products that have added fiber (fiber supplements).  Drink enough fluid to keep your urine clear or pale yellow. Managing pain and swelling   Take warm sitz baths for 20 minutes, 3-4 times a day to ease pain and discomfort.  If directed, apply ice to the affected area. Using ice packs between sitz baths may be helpful.  Put ice in a plastic bag.  Place a towel between your skin and the bag.  Leave the ice on for 20 minutes, 2-3 times a day. General instructions   Take over-the-counter and prescription medicines only as told by your health care provider.  Use medicated creams or suppositories as told.  Exercise regularly.  Go to the bathroom when you have the urge to have a bowel movement. Do not wait.  Avoid straining to have bowel movements.  Keep the anal area dry and clean. Use wet toilet paper or moist towelettes after a bowel movement.  Do not sit on the toilet for long periods of time. This increases blood pooling and pain. Contact a health care provider if:  You have increasing pain and swelling that are not controlled by treatment or medicine.  You have uncontrolled bleeding.  You have difficulty having a bowel movement, or you are unable to have a bowel movement.  You have pain or inflammation outside the area of the hemorrhoids. This information is not intended to replace advice given to you by your health care provider. Make sure you discuss any questions you have with your health care  provider. Document Released: 05/25/2000 Document Revised: 10/26/2015 Document Reviewed: 02/09/2015 Elsevier Interactive Patient Education  2017 [employer].

## 2016-10-05 ENCOUNTER — Other Ambulatory Visit: Payer: Self-pay

## 2016-10-05 ENCOUNTER — Encounter: Payer: Self-pay | Admitting: Gastroenterology

## 2016-10-05 LAB — COMPREHENSIVE METABOLIC PANEL
A/G RATIO: 1.7 (ref 1.2–2.2)
ALBUMIN: 4.3 g/dL (ref 3.5–5.5)
ALT: 23 IU/L (ref 0–32)
AST: 22 IU/L (ref 0–40)
Alkaline Phosphatase: 93 IU/L (ref 39–117)
BUN / CREAT RATIO: 14 (ref 9–23)
BUN: 11 mg/dL (ref 6–24)
Bilirubin Total: <0.2 mg/dL (ref 0.0–1.2)
CALCIUM: 9.7 mg/dL (ref 8.7–10.2)
CO2: 23 mmol/L (ref 18–29)
CREATININE: 0.76 mg/dL (ref 0.57–1.00)
Chloride: 99 mmol/L (ref 96–106)
GFR, EST AFRICAN AMERICAN: 106 mL/min/{1.73_m2} (ref >59)
GFR, EST NON AFRICAN AMERICAN: 92 mL/min/{1.73_m2} (ref >59)
GLOBULIN, TOTAL: 2.5 g/dL (ref 1.5–4.5)
Glucose: 83 mg/dL (ref 65–99)
POTASSIUM: 4.4 mmol/L (ref 3.5–5.2)
Sodium: 138 mmol/L (ref 134–144)
Total Protein: 6.8 g/dL (ref 6.0–8.5)

## 2016-10-05 LAB — H. PYLORI BREATH TEST: H. PYLORI UBIT: NEGATIVE

## 2016-10-05 LAB — LIPASE: Lipase: 36 U/L (ref 14–72)

## 2016-10-05 MED ORDER — HYDROCORTISONE 2.5 % RE CREA: 1.0000 "application " | TOPICAL_CREAM | Freq: Two times a day (BID) | RECTAL | 2 refills | Status: DC

## 2016-10-05 NOTE — Telephone Encounter (Signed)
hydrocrortisone ac 25 not covered  proctosol hc @% cream is, please send new rx if appropriate

## 2016-10-06 LAB — URINE CULTURE

## 2016-10-07 LAB — PAP IG, CT-NG, RFX HPV ASCU
Chlamydia, Nuc. Acid Amp: NEGATIVE
GONOCOCCUS BY NUCLEIC ACID AMP: NEGATIVE
PAP SMEAR COMMENT: 0

## 2016-10-11 ENCOUNTER — Ambulatory Visit: Payer: BLUE CROSS/BLUE SHIELD | Admitting: Gastroenterology

## 2016-10-18 ENCOUNTER — Ambulatory Visit (INDEPENDENT_AMBULATORY_CARE_PROVIDER_SITE_OTHER): Payer: BLUE CROSS/BLUE SHIELD | Admitting: Gastroenterology

## 2016-10-18 ENCOUNTER — Telehealth: Payer: Self-pay | Admitting: Emergency Medicine

## 2016-10-18 ENCOUNTER — Encounter: Payer: Self-pay | Admitting: Gastroenterology

## 2016-10-18 VITALS — BP 118/68 | HR 68 | Ht 64.5 in | Wt 155.0 lb

## 2016-10-18 DIAGNOSIS — K625 Hemorrhage of anus and rectum: Secondary | ICD-10-CM

## 2016-10-18 DIAGNOSIS — Z1211 Encounter for screening for malignant neoplasm of colon: Secondary | ICD-10-CM | POA: Insufficient documentation

## 2016-10-18 MED ORDER — HYDROCORTISONE ACETATE 25 MG RE SUPP: 25.0000 mg | Freq: Every day | RECTAL | 1 refills | Status: DC

## 2016-10-18 MED ORDER — NA SULFATE-K SULFATE-MG SULF 17.5-3.13-1.6 GM/177ML PO SOLN: 1.0000 | ORAL | 0 refills | Status: DC

## 2016-10-18 NOTE — Telephone Encounter (Signed)
I think that patient's PCP prescribed cream.  Please confirm and then patient can put the cream on Prep H suppositories.

## 2016-10-18 NOTE — Progress Notes (Signed)
Agree with assessment and plan as outlined. Will await colonoscopy, pending results, if candidate for banding of hemorrhoids we can take care of that for her.

## 2016-10-18 NOTE — Progress Notes (Signed)
10/18/2016 [name] [id number] 1965-12-22   HISTORY OF PRESENT ILLNESS:  This is a pleasant 51 year old female who is remotely known to Dr. Sharlett Iles in 2005 for an EGD that was normal at that time. She is here today at the request of her PCP, Dr. Brigitte Pulse, for evaluation of rectal bleeding, hemorrhoids, abdominal pain. The patient primarily focuses on her rectal bleeding and hemorrhoids today. She tells me that for the past few months she's been having a lot of problems with rectal bleeding and hemorrhoids. She says that about with every bowel movement she is having some bright red blood on the toilet paper. Intermittently she'll have some darker blood after bowel movements. She says that her hemorrhoids are prolapsing from her rectum and her painful even without a bowel movement. She is interested in possible surgery or banding of these. 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 In regards to her abdominal pain she says that the pain/discomfort is constantly in her lower abdomen. She seems to attribute it to possible scar tissue from her hysterectomy as she had a low transverse incision.  She had a colonoscopy elsewhere at age 44 that was reportedly normal at that time.  Has normal BM's without straining.  Recent CBC, CMP, and lipase normal.  Has history of Hpylori diagnosed by breath test that was treated by PCP in 03/2015.  Recent Hpylori breath test negative.  Past Medical History:  Diagnosis Date  . Anemia    Past Surgical History:  Procedure Laterality Date  . ABDOMINAL HYSTERECTOMY    . CESAREAN SECTION      reports that she has been smoking Cigarettes.  She has a 20.00 pack-year smoking history. She has never used smokeless tobacco. She reports that she does not drink alcohol or use drugs. family history includes Diabetes in her other; Hypertension in her other. Allergies    Allergen Reactions  . Hydrocodone Itching      Outpatient Encounter Prescriptions as of 10/18/2016  Medication Sig  . Dextromethorphan-Guaifenesin (MUCINEX DM PO) Take by mouth.  Marland Kitchen omeprazole (PRILOSEC) 40 MG capsule Take 1 capsule (40 mg total) by mouth 2 (two) times daily. 30 minutes prior to breakfast and dinner (Patient taking differently: Take 40 mg by mouth 3 times/day as needed-between meals & bedtime. 30 minutes prior to breakfast and dinner)  . [DISCONTINUED] ondansetron (ZOFRAN-ODT) 8 MG disintegrating tablet Take 1 tablet (8 mg total) by mouth every 8 (eight) hours as needed for nausea.  . [DISCONTINUED] traMADol (ULTRAM) 50 MG tablet Take 1-2 tablets (50-100 mg total) by mouth every 6 (six) hours as needed.  . [DISCONTINUED] ciprofloxacin (CIPRO) 500 MG tablet Take 1 tablet (500 mg total) by mouth 2 (two) times daily.  . [DISCONTINUED] hydrocortisone (ANUSOL-HC) 25 MG suppository Place 1 suppository (25 mg total) rectally 2 (two) times daily.  . [DISCONTINUED] hydrocortisone (PROCTOSOL HC) 2.5 % rectal cream Place 1 application rectally 2 (two) times daily.  . [DISCONTINUED] metroNIDAZOLE (FLAGYL) 500 MG tablet Take 1 tablet (500 mg total) by mouth 2 (two) times daily.   No facility-administered encounter medications on file as of 10/18/2016.      REVIEW OF SYSTEMS  : All other systems reviewed and negative except where noted in the History of Present Illness.   PHYSICAL EXAM: BP 118/68 (BP Location: Right Arm, Patient Position: Sitting, Cuff Size: Normal)  Pulse 68   Ht 5' 4.5" (1.638 m)   Wt 155 lb (70.3 kg)   BMI 26.19 kg/m  General: Well developed black female in no acute distress Head: Normocephalic and atraumatic Eyes:  Sclerae anicteric, conjunctiva pink. Ears: Normal auditory acuity Lungs: Clear throughout to auscultation; no increased WOB. Heart: Regular rate and rhythm Abdomen: Soft, non-distended. Normal bowel sounds.  Mild lower abdominal TTP. Rectal:   Small external hemorrhoids seen including one posteriorly with inflammation/irritation.  No masses felt on DRE. Musculoskeletal: Symmetrical with no gross deformities  Skin: No lesions on visible extremities Extremities: No edema  Neurological: Alert oriented x 4, grossly non-focal Psychological:  Alert and cooperative. Normal mood and affect  ASSESSMENT AND PLAN: -Screening colonoscopy:  Will be scheduled with Dr. Havery Moros. -Rectal bleeding:  Has some hemorrhoids.  Will be reassessed with colonoscopy next week.  Patient is interested in possible banding or surgery.  Will use hydrocortisone suppositories for now. -Abdominal pain:  Constant lower discomfort.  ? Source.  ? Due to scar tissue from her hysterectomy.  *The risks, benefits, and alternatives to colonoscopy were discussed with the patient and she consents to proceed.   CC:  Shawnee Knapp, MD

## 2016-10-18 NOTE — Telephone Encounter (Signed)
Patient informed and verbalized understanding

## 2016-10-18 NOTE — Patient Instructions (Signed)
We have sent the following medications to your pharmacy for you to pick up at your convenience: Hydrocortisone suppositories  You have been scheduled for a colonoscopy. Please follow written instructions given to you at your visit today.  Please pick up your prep supplies at the pharmacy within the next 1-3 days. If you use inhalers (even only as needed), please bring them with you on the day of your procedure. Your physician has requested that you go to www.startemmi.com and enter the access code given to you at your visit today. This web site gives a general overview about your procedure. However, you should still follow specific instructions given to you by our office regarding your preparation for the procedure.

## 2016-10-18 NOTE — Telephone Encounter (Signed)
Patients pharmacy sent fax that suppositories are not covered by her insurance. Please suggest an alternative.

## 2016-10-23 IMAGING — CT CT RENAL STONE PROTOCOL
2 of 3 series · 16 of 33 positions shown, 18 images · non-contrast
Comparison: 07/04/2009

CLINICAL DATA: Left lower quadrant pain. Left flank pain. Symptoms
for 2 days

EXAM:
CT ABDOMEN AND PELVIS WITHOUT CONTRAST
TECHNIQUE: Multidetector CT imaging of the abdomen and pelvis was performed
following the standard protocol without IV contrast.

[Series 3: coronal · coronal · 0.65mm/px · 3 of 80 slices shown]
[im 27/80  soft-tissue]
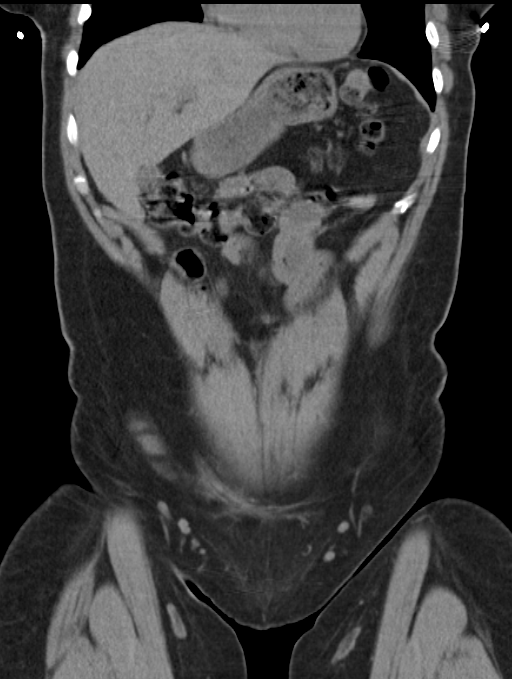
[im 36/80  soft-tissue]
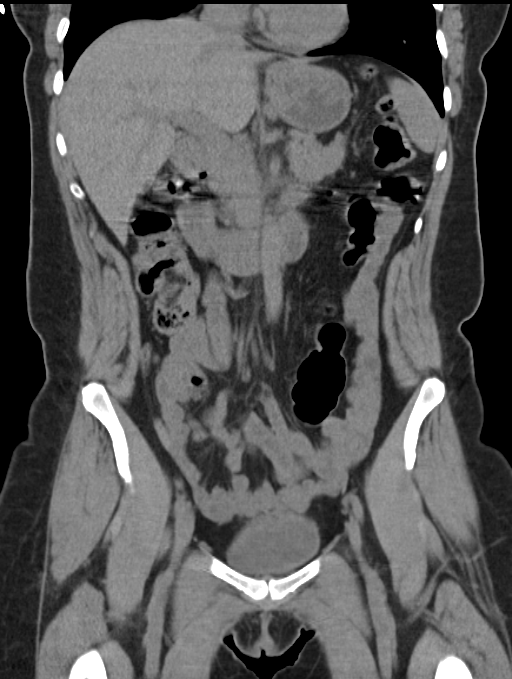
[im 44/80  soft-tissue]
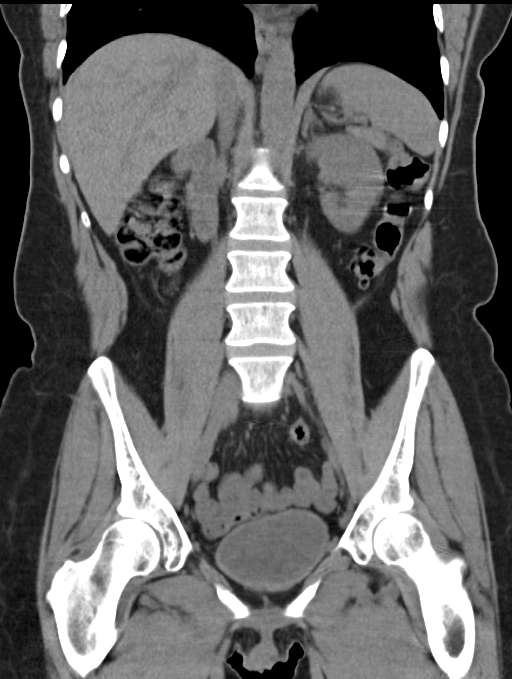

[Series 6: lung · axial · 0.70mm/px · z∈[+1436,+1516]mm · 13 of 19 slices shown, 15 images]
[im 2/19  soft-tissue]
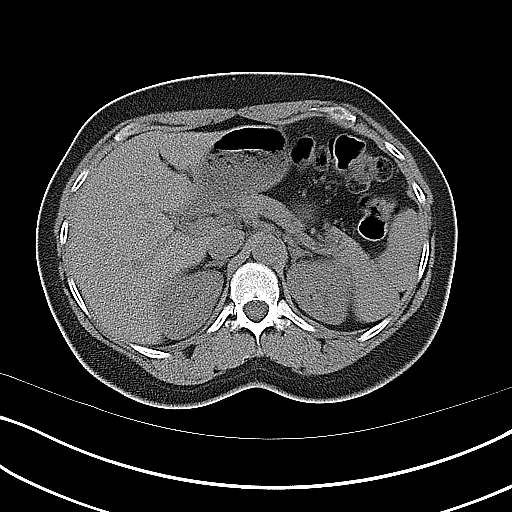
[im 2/19  bone]
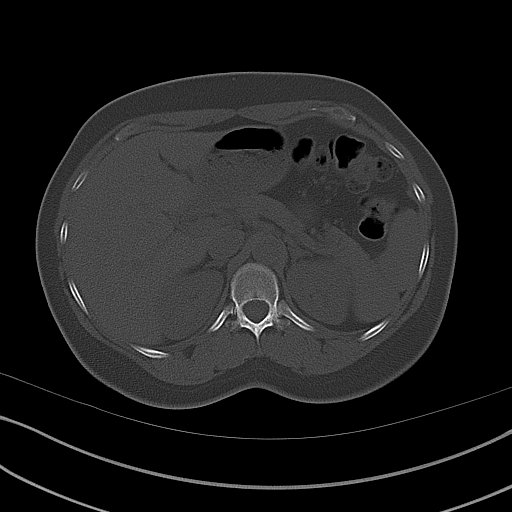
[im 3/19  soft-tissue]
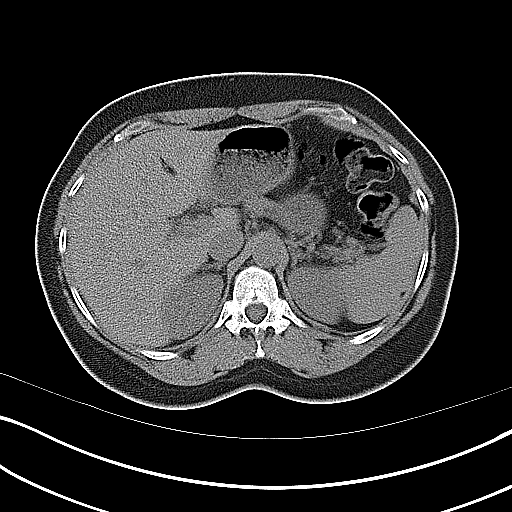
[im 5/19  soft-tissue]
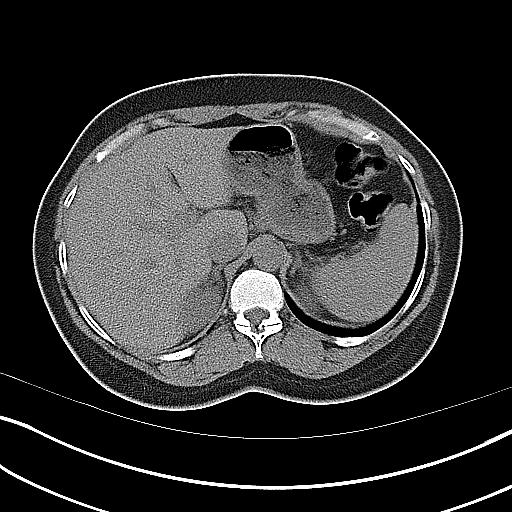
[im 6/19  soft-tissue]
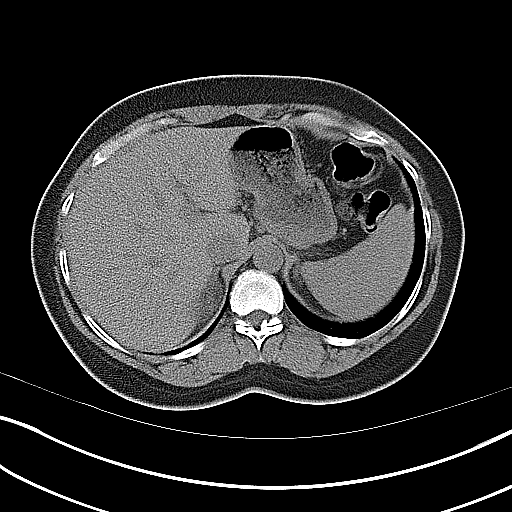
[im 7/19  soft-tissue]
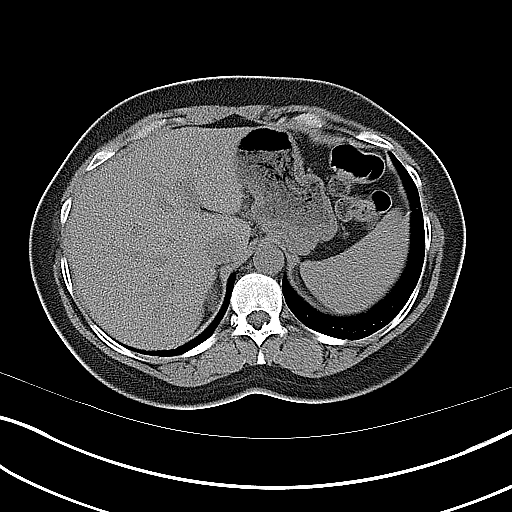
[im 9/19  soft-tissue]
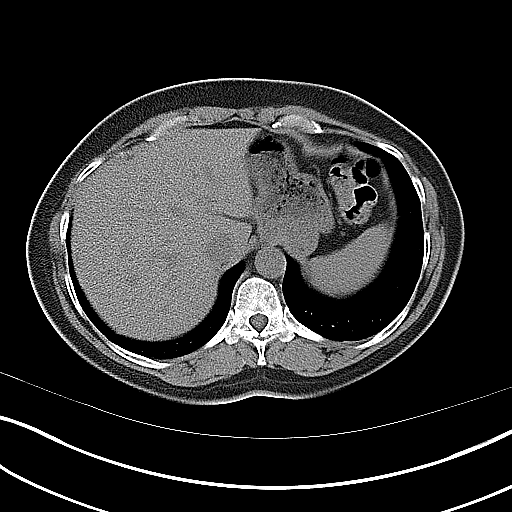
[im 10/19  soft-tissue]
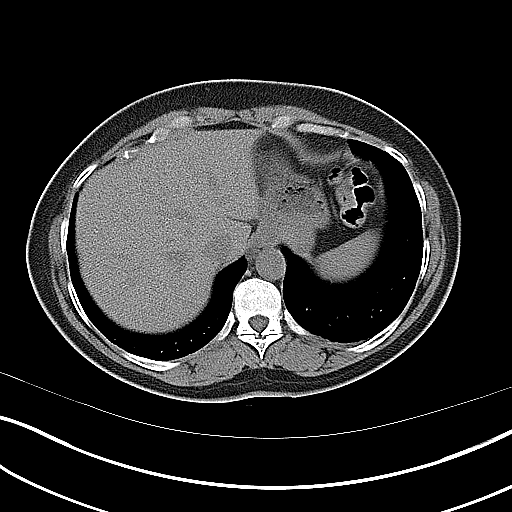
[im 11/19  soft-tissue]
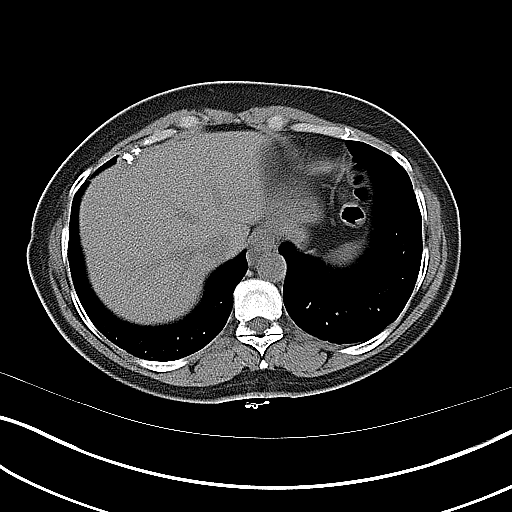
[im 13/19  soft-tissue]
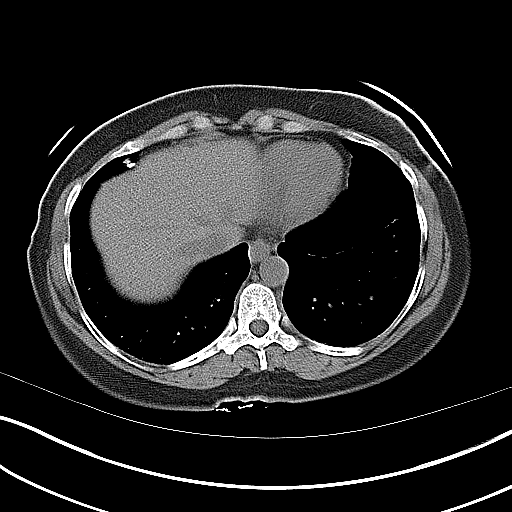
[im 13/19  bone]
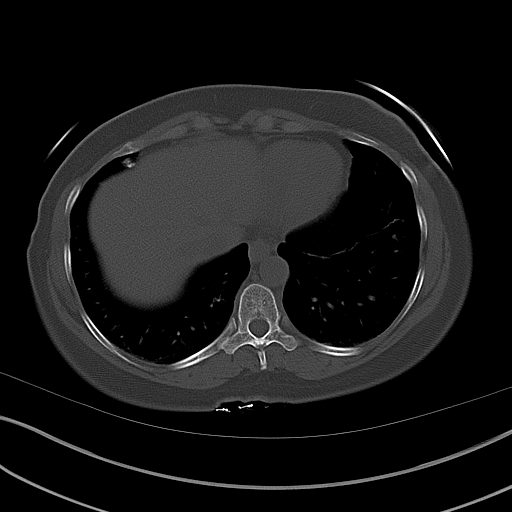
[im 14/19  soft-tissue]
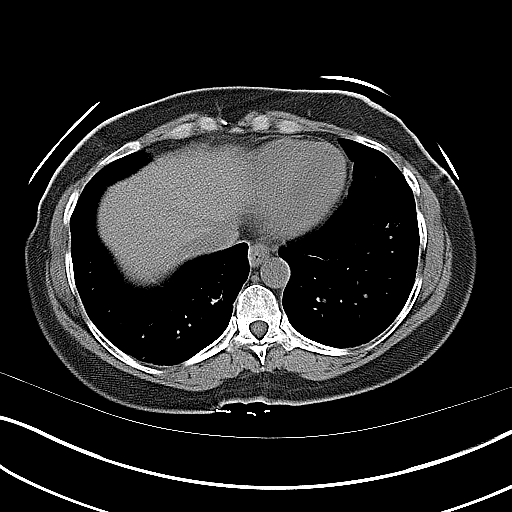
[im 15/19  soft-tissue]
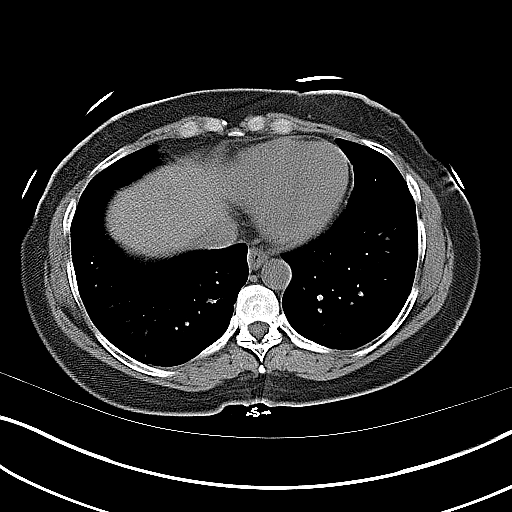
[im 17/19  soft-tissue]
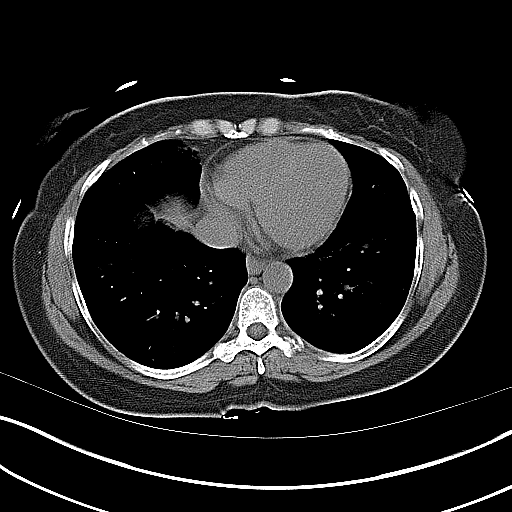
[im 18/19  soft-tissue]
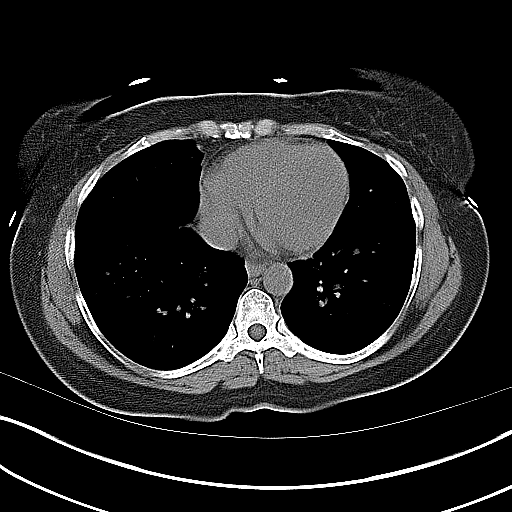

[16 of 33 positions shown; findings below may reference images not displayed]

FINDINGS: Stable tiny nodule in the left lower lobe on image 3. Pleural
calcifications at the anterior right lung base are more prominent.
No associated mass.

Stable 3 mm calcification in the posterior right renal upper pole.
No hydronephrosis. No definite ureteral calculus.

Unenhanced liver, gallbladder, spleen, pancreas, adrenal glands are
within normal limits

Uterus is absent. Bladder is unremarkable. Adnexa are within normal
limits

No free-fluid.  No definite abnormal retroperitoneal adenopathy.

Normal appendix.

No focal bowel lesion.  No evidence of bowel obstruction.

No vertebral compression deformity
IMPRESSION: No evidence of ureteral obstruction.

Small right renal calculus is noted.

## 2016-10-25 ENCOUNTER — Telehealth: Payer: Self-pay

## 2016-10-25 ENCOUNTER — Ambulatory Visit (AMBULATORY_SURGERY_CENTER): Payer: BLUE CROSS/BLUE SHIELD | Admitting: Gastroenterology

## 2016-10-25 VITALS — BP 150/74 | HR 71 | Temp 97.5°F | Resp 16 | Ht 64.0 in | Wt 155.0 lb

## 2016-10-25 DIAGNOSIS — K921 Melena: Secondary | ICD-10-CM

## 2016-10-25 DIAGNOSIS — K6389 Other specified diseases of intestine: Secondary | ICD-10-CM

## 2016-10-25 DIAGNOSIS — Z1211 Encounter for screening for malignant neoplasm of colon: Secondary | ICD-10-CM | POA: Diagnosis not present

## 2016-10-25 DIAGNOSIS — K635 Polyp of colon: Secondary | ICD-10-CM

## 2016-10-25 DIAGNOSIS — K625 Hemorrhage of anus and rectum: Secondary | ICD-10-CM | POA: Diagnosis not present

## 2016-10-25 DIAGNOSIS — K649 Unspecified hemorrhoids: Secondary | ICD-10-CM

## 2016-10-25 DIAGNOSIS — D123 Benign neoplasm of transverse colon: Secondary | ICD-10-CM

## 2016-10-25 DIAGNOSIS — D122 Benign neoplasm of ascending colon: Secondary | ICD-10-CM

## 2016-10-25 MED ORDER — SODIUM CHLORIDE 0.9 % IV SOLN: 500.0000 mL | INTRAVENOUS | Status: DC

## 2016-10-25 NOTE — Progress Notes (Signed)
To recovery, report to Jones, RN, VSS 

## 2016-10-25 NOTE — Progress Notes (Signed)
Pt's states no medical or surgical changes since previsit or office visit. 

## 2016-10-25 NOTE — Progress Notes (Signed)
Called to room to assist during endoscopic procedure.  Patient ID and intended procedure confirmed with present staff. Received instructions for my participation in the procedure from the performing physician.  

## 2016-10-25 NOTE — Telephone Encounter (Signed)
Left message for patient that Dr. Havery Moros asked that I call her to schedule her first hemorrhoid banding. Asked that if she is interested that she contact our office.

## 2016-10-25 NOTE — Telephone Encounter (Signed)
-----   Message from Manus Gunning, MD sent at 10/25/2016 12:04 PM EDT ----- Regarding: hemorrhoid banding Almyra Free could you call this patient and schedule her for hemorrhoid banding at her convenience, next routine appt available. Thanks

## 2016-10-25 NOTE — Op Note (Signed)
[facility] Patient Name: [name] Procedure Date: 10/25/2016 10:59 AM MRN: [id number] Endoscopist: Remo Lipps P. Ibrahima Holberg MD, MD Age: 51 Referring MD:  Date of Birth: May 24, 1966 Gender: Female Account #: [id number] Procedure:                Colonoscopy Indications:              Hematochezia Medicines:                Monitored Anesthesia Care Procedure:                Pre-Anesthesia Assessment:                           - Prior to the procedure, a History and Physical                            was performed, and patient medications and                            allergies were reviewed. The patient's tolerance of                            previous anesthesia was also reviewed. The risks                            and benefits of the procedure and the sedation                            options and risks were discussed with the patient.                            All questions were answered, and informed consent                            was obtained. Prior Anticoagulants: The patient has                            taken no previous anticoagulant or antiplatelet                            agents. ASA Grade Assessment: II - A patient with                            mild systemic disease. After reviewing the risks                            and benefits, the patient was deemed in                            satisfactory condition to undergo the procedure.                           After obtaining informed consent, the colonoscope  was passed under direct vision. Throughout the                            procedure, the patient's blood pressure, pulse, and                            oxygen saturations were monitored continuously. The                            Model CF-HQ190L [id number]) scope was introduced                            through the anus and advanced to the the terminal                            ileum, with identification of the  appendiceal                            orifice and IC valve. The colonoscopy was performed                            without difficulty. The patient tolerated the                            procedure well. The quality of the bowel                            preparation was good. The terminal ileum, ileocecal                            valve, appendiceal orifice, and rectum were                            photographed. Scope In: 11:07:50 AM Scope Out: 11:23:31 AM Scope Withdrawal Time: 0 hours 13 minutes 28 seconds  Total Procedure Duration: 0 hours 15 minutes 41 seconds  Findings:                 The perianal exam findings include a skin tag.                           A diminutive polyp was found in the ascending                            colon. The polyp was sessile. The polyp was removed                            with a cold biopsy forceps. Resection and retrieval                            were complete.                           A 5 mm polyp was found in the transverse colon. The  polyp was sessile. The polyp was removed with a                            cold snare. Resection and retrieval were complete.                           A few medium-mouthed diverticula were found in the                            left colon and right colon.                           Internal hemorrhoids were found during                            retroflexion. The hemorrhoids were small.                           The terminal ileum appeared normal.                           The exam was otherwise without abnormality. Complications:            No immediate complications. Estimated blood loss:                            Minimal. Estimated Blood Loss:     Estimated blood loss was minimal. Impression:               - Perianal skin tag found on perianal exam.                           - One diminutive polyp in the ascending colon,                            removed with a cold  biopsy forceps. Resected and                            retrieved.                           - One 5 mm polyp in the transverse colon, removed                            with a cold snare. Resected and retrieved.                           - Diverticulosis in the left colon and in the right                            colon.                           - Internal hemorrhoids.                           -  The examined portion of the ileum was normal.                           - The examination was otherwise normal.                           Overall, suspect rectal bleeding is related to                            hemorrhoids noted on this exam. Recommendation:           - Patient has a contact number available for                            emergencies. The signs and symptoms of potential                            delayed complications were discussed with the                            patient. Return to normal activities tomorrow.                            Written discharge instructions were provided to the                            patient.                           - Resume previous diet.                           - Continue present medications.                           - Daily fuber supplement                           - Follow up in the GI clinic for consideration for                            hemorrhoid banding for further therapy                           - Await pathology results.                           - Repeat colonoscopy is recommended for                            surveillance. The colonoscopy date will be                            determined after pathology results from today's                            exam  become available for review.                           - No ibuprofen, naproxen, or other non-steroidal                            anti-inflammatory drugs for 2 weeks after polyp                            removal. Remo Lipps P. Kaleb Sek MD, MD 10/25/2016 11:28:38  AM This report has been signed electronically.

## 2016-10-25 NOTE — Patient Instructions (Signed)
YOU HAD AN ENDOSCOPIC PROCEDURE TODAY AT [facility]:   Refer to the procedure report that was given to you for any specific questions about what was found during the examination.  If the procedure report does not answer your questions, please call your gastroenterologist to clarify.  If you requested that your care partner not be given the details of your procedure findings, then the procedure report has been included in a sealed envelope for you to review at your convenience later.  YOU SHOULD EXPECT: Some feelings of bloating in the abdomen. Passage of more gas than usual.  Walking can help get rid of the air that was put into your GI tract during the procedure and reduce the bloating. If you had a lower endoscopy (such as a colonoscopy or flexible sigmoidoscopy) you may notice spotting of blood in your stool or on the toilet paper. If you underwent a bowel prep for your procedure, you may not have a normal bowel movement for a few days.  Please Note:  You might notice some irritation and congestion in your nose or some drainage.  This is from the oxygen used during your procedure.  There is no need for concern and it should clear up in a day or so.  SYMPTOMS TO REPORT IMMEDIATELY:   Following lower endoscopy (colonoscopy or flexible sigmoidoscopy):  Excessive amounts of blood in the stool  Significant tenderness or worsening of abdominal pains  Swelling of the abdomen that is new, acute  Fever of 100F or higher    For urgent or emergent issues, a gastroenterologist can be reached at any hour by calling [phone].   DIET:  We do recommend a small meal at first, but then you may proceed to your regular diet.  Drink plenty of fluids but you should avoid alcoholic beverages for 24 hours.  ACTIVITY:  You should plan to take it easy for the rest of today and you should NOT DRIVE or use heavy machinery until tomorrow (because of the sedation medicines used during the test).     FOLLOW UP: Our staff will call the number listed on your records the next business day following your procedure to check on you and address any questions or concerns that you may have regarding the information given to you following your procedure. If we do not reach you, we will leave a message.  However, if you are feeling well and you are not experiencing any problems, there is no need to return our call.  We will assume that you have returned to your regular daily activities without incident.  If any biopsies were taken you will be contacted by phone or by letter within the next 1-3 weeks.  Please call us at [phone] if you have not heard about the biopsies in 3 weeks.   Await for biopsy results to determined next repeat Colonoscopy screening Polyps (handout given) Hemorrhoids and Hemorrhoids banding (handout given) No Ibuprofen, naproxen or other non-steriodal anti-inflammatory drugs given for two weeks after polyps removal High Fiber Diet (handout given) Daily fiber supplement  SIGNATURES/CONFIDENTIALITY: You and/or your care partner have signed paperwork which will be entered into your electronic medical record.  These signatures attest to the fact that that the information above on your After Visit Summary has been reviewed and is understood.  Full responsibility of the confidentiality of this discharge information lies with you and/or your care-partner.

## 2016-10-26 ENCOUNTER — Telehealth: Payer: Self-pay | Admitting: *Deleted

## 2016-10-26 NOTE — Telephone Encounter (Signed)
  Follow up Call-  Call back number 10/25/2016  Post procedure Call Back phone  # [phone]  Permission to leave phone message Yes  Some recent data might be hidden     Patient questions:  Message left to call us if necessary.  Second call.

## 2016-10-26 NOTE — Telephone Encounter (Signed)
No answer left message and will attempt to call back later this afternoon. Sm 

## 2016-10-27 ENCOUNTER — Ambulatory Visit: Payer: BLUE CROSS/BLUE SHIELD | Admitting: Family Medicine

## 2016-10-31 ENCOUNTER — Ambulatory Visit (INDEPENDENT_AMBULATORY_CARE_PROVIDER_SITE_OTHER): Payer: BLUE CROSS/BLUE SHIELD | Admitting: Family Medicine

## 2016-10-31 ENCOUNTER — Encounter: Payer: Self-pay | Admitting: Family Medicine

## 2016-10-31 ENCOUNTER — Encounter: Payer: Self-pay | Admitting: Gastroenterology

## 2016-10-31 VITALS — BP 119/86 | HR 82 | Temp 98.5°F | Resp 16 | Ht 64.0 in | Wt 156.4 lb

## 2016-10-31 DIAGNOSIS — G4719 Other hypersomnia: Secondary | ICD-10-CM | POA: Diagnosis not present

## 2016-10-31 DIAGNOSIS — R1032 Left lower quadrant pain: Secondary | ICD-10-CM | POA: Diagnosis not present

## 2016-10-31 DIAGNOSIS — R300 Dysuria: Secondary | ICD-10-CM

## 2016-10-31 DIAGNOSIS — G479 Sleep disorder, unspecified: Secondary | ICD-10-CM | POA: Diagnosis not present

## 2016-10-31 DIAGNOSIS — B9689 Other specified bacterial agents as the cause of diseases classified elsewhere: Secondary | ICD-10-CM

## 2016-10-31 DIAGNOSIS — K648 Other hemorrhoids: Secondary | ICD-10-CM

## 2016-10-31 DIAGNOSIS — R1031 Right lower quadrant pain: Secondary | ICD-10-CM | POA: Diagnosis not present

## 2016-10-31 DIAGNOSIS — R5383 Other fatigue: Secondary | ICD-10-CM | POA: Diagnosis not present

## 2016-10-31 DIAGNOSIS — N760 Acute vaginitis: Secondary | ICD-10-CM | POA: Diagnosis not present

## 2016-10-31 DIAGNOSIS — Z8619 Personal history of other infectious and parasitic diseases: Secondary | ICD-10-CM | POA: Diagnosis not present

## 2016-10-31 DIAGNOSIS — G8929 Other chronic pain: Secondary | ICD-10-CM | POA: Diagnosis not present

## 2016-10-31 DIAGNOSIS — G4710 Hypersomnia, unspecified: Secondary | ICD-10-CM

## 2016-10-31 LAB — POCT WET + KOH PREP
Trich by wet prep: ABSENT
YEAST BY KOH: ABSENT
YEAST BY WET PREP: ABSENT

## 2016-10-31 LAB — POCT URINALYSIS DIP (MANUAL ENTRY)
BILIRUBIN UA: NEGATIVE mg/dL
Bilirubin, UA: NEGATIVE
Blood, UA: NEGATIVE
GLUCOSE UA: NEGATIVE mg/dL
Leukocytes, UA: NEGATIVE
Nitrite, UA: NEGATIVE
Urobilinogen, UA: 0.2 E.U./dL
pH, UA: 5.5 (ref 5.0–8.0)

## 2016-10-31 LAB — POC MICROSCOPIC URINALYSIS (UMFC): MUCUS RE: ABSENT

## 2016-10-31 MED ORDER — MODAFINIL 100 MG PO TABS: 200.0000 mg | ORAL_TABLET | Freq: Every day | ORAL | 0 refills | Status: DC

## 2016-10-31 MED ORDER — MODAFINIL 200 MG PO TABS: 200.0000 mg | ORAL_TABLET | Freq: Every day | ORAL | 0 refills | Status: DC

## 2016-10-31 MED ORDER — METRONIDAZOLE 0.75 % VA GEL: 1.0000 | Freq: Every day | VAGINAL | 0 refills | Status: DC

## 2016-10-31 MED ORDER — RANITIDINE HCL 150 MG PO TABS: 150.0000 mg | ORAL_TABLET | Freq: Two times a day (BID) | ORAL | 11 refills | Status: DC | PRN

## 2016-10-31 NOTE — Progress Notes (Signed)
Subjective:  By signing my name below, I, Essence Howell, attest that this documentation has been prepared under the direction and in the presence of [name], MD Electronically Signed: Ladene Artist, ED Scribe 10/31/2016 at 9:21 AM.   Patient ID: [name], female    DOB: Jan 23, 1966, 51 y.o.   MRN: [id number]  Chief Complaint  Patient presents with  . Follow-up    Pt reports no change in symptoms, had colonoscopy 5/17 but has not gotten results    HPI [name] is a 51 y.o. female who presents to Primary Care at [facility] for a follow-up. Pt was last seen 1 month prior. She has had years of chronic generalized abdominal pain, right worse than left, that intermittently flares and radiates to her back. She does have severe GERD symptoms with trigger foods with occasional vomiting. Last month had hemorrhoidal bleeding. On evaluation 1 month ago, was shown to have klebsiella UTI, trichamonis vaginalis, vaginal yeast infection and positive hemoccult. Due to severity of pt's pain, nausea and vomiting, treated for pyelonephritis with 1 dose of 1 gram IM Rocephin, Sipro 500 mg bid x1 week, in addition to Diflucan and Flagyl. Her PPI was increased to bid and she was given Tramadol for pain control. She was seen by GI 2 weeks ago. At that time, pt she was attributing her chronic abdominal pain to scarred tissues from hysterectomy. Colonoscopy last week confirmed that rectal bleeding was likely due to internal hemorrhoids and reccommended banding. She has an appointment for this next week.   Today, pt reports that she has been applying creams to hemorrhoids with some relief. States that her insurance would not cover suppository. She states that she has still been experiencing some heartburn which she has been treating with Omeprazole. Pt denies vaginal discharge. She has a follow-up appointment with GI on 5/30 for banding procedure.   Fatigue  Pt reports fatigue. She states that this has been an  ongoing problem for a while. 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 She eats, winds down and goes to bed around midnight/1 AM and sleeps throughout the night. She wakes around 9 AM fatigued and with difficulty getting out of bed in the mornings. People comment to her all the time that she looks exhausted. Pt states that her mother was the same way. Caffeine makes her feel better for a very short time. Pt does not fall asleep while driving but feels like she phases out and doesn't remember things or passing landmarks. On the weekends if sitting, she will frequently falls asleep unintentionally. Given the opportunity the sleep later on the weekend, she doe snot as her body naturally wakes.   Past Medical History:  Diagnosis Date  . Anemia    Current Outpatient Prescriptions on File Prior to Visit  Medication Sig Dispense Refill  . Dextromethorphan-Guaifenesin (MUCINEX DM PO) Take by mouth.    . hydrocortisone (ANUSOL-HC) 25 MG suppository Place 1 suppository (25 mg total) rectally at bedtime. (Patient not taking: Reported on 10/31/2016) 10 suppository 1  . omeprazole (PRILOSEC) 40 MG capsule Take 1 capsule (40 mg total) by mouth 2 (two) times daily. 30 minutes prior to breakfast and dinner (Patient not taking: Reported on 10/31/2016) 60 capsule 1   Current Facility-Administered Medications on File Prior to Visit  Medication Dose Route Frequency Provider Last Rate Last Dose  . 0.9 %  sodium chloride infusion  500 mL Intravenous Continuous [place] [facility]  [name], MD       Allergies  Allergen Reactions  . Hydrocodone Itching   Past Surgical History:  Procedure Laterality Date  . ABDOMINAL HYSTERECTOMY    . CESAREAN SECTION     Family History  Problem Relation Age of Onset  . Hypertension Other   . Diabetes Other    Social History   Social History  . Marital status: Single    Spouse name: N/A  . Number of children: 2  . Years of education: N/A    Occupational History  . Scientist, forensic     Social History Main Topics  . Smoking status: Current Every Day Smoker    Packs/day: 1.00    Years: 20.00    Types: Cigarettes  . Smokeless tobacco: Never Used  . Alcohol use No  . Drug use: No  . Sexual activity: Yes    Birth control/ protection: None   Other Topics Concern  . None   Social History Narrative  . None   Depression screen [facility] 2/9 10/31/2016 10/04/2016 07/12/2016 04/07/2015 03/16/2015  Decreased Interest 0 0 0 0 0  Down, Depressed, Hopeless 0 0 0 0 0  PHQ - 2 Score 0 0 0 0 0    Review of Systems  Constitutional: Positive for fatigue.  Genitourinary: Negative for vaginal discharge.  see hpi    Objective:   Physical Exam  Constitutional: She is oriented to person, place, and time. She appears well-developed and well-nourished. No distress.  HENT:  Head: Normocephalic and atraumatic.  Eyes: Conjunctivae and EOM are normal.  Neck: Neck supple. No tracheal deviation present.  Cardiovascular: Normal rate, regular rhythm, S1 normal, S2 normal and normal heart sounds.   Pulmonary/Chest: Effort normal and breath sounds normal. No respiratory distress.  Genitourinary:  Genitourinary Comments: Moderate to large amount of thick white clumpy vaginal discharge.   Musculoskeletal: Normal range of motion.  Neurological: She is alert and oriented to person, place, and time.  Skin: Skin is warm and dry.  Psychiatric: She has a normal mood and affect. Her behavior is normal.  Nursing note and vitals reviewed.  BP 119/86   Pulse 82   Temp 98.5 F (36.9 C) (Oral)   Resp 16   Ht 5\' 4"  (1.626 m)   Wt 156 lb 6.4 oz (70.9 kg)   SpO2 99%   BMI 26.85 kg/m     Assessment & Plan:   1. Chronic bilateral lower abdominal pain   2. Dysuria   3. History of trichomonal vaginitis   4. Internal hemorrhoid, bleeding   5. Other fatigue   6. Sleep disorder   7. Daytime hypersomnia   8. Bacterial vaginosis     Orders Placed This  Encounter  Procedures  . Urine culture  . Ambulatory referral to Neurology    Referral Priority:   Routine    Referral Type:   Consultation    Referral Reason:   Specialty Services Required    Requested Specialty:   Neurology    Number of Visits Requested:   1  . POCT urinalysis dipstick  . POCT Microscopic Urinalysis ([facility])  . POCT Wet + KOH Prep    Meds ordered this encounter  Medications  . DISCONTD: modafinil (PROVIGIL) 100 MG tablet    Sig: Take 2 tablets (200 mg total) by mouth daily.    Dispense:  30 tablet    Refill:  0  . metroNIDAZOLE (METROGEL VAGINAL) 0.75 % vaginal gel    Sig: Place 1  Applicatorful vaginally at bedtime. X 5 nights    Dispense:  70 g    Refill:  0  . modafinil (PROVIGIL) 200 MG tablet    Sig: Take 1 tablet (200 mg total) by mouth daily.    Dispense:  30 tablet    Refill:  0  . ranitidine (ZANTAC) 150 MG tablet    Sig: Take 1 tablet (150 mg total) by mouth 2 (two) times daily as needed for heartburn.    Dispense:  60 tablet    Refill:  11    I personally performed the services described in this documentation, which was scribed in my presence. The recorded information has been reviewed and considered, and addended by me as needed.   [name], M.D.  Primary Care at [facility] [address] [place], [place] [address] [phone] phone [phone] fax  11/13/16 2:44 AM

## 2016-10-31 NOTE — Patient Instructions (Addendum)
IF you received an x-ray today, you will receive an invoice from [facility]. Please contact [facility] at [phone] with questions or concerns regarding your invoice.   IF you received labwork today, you will receive an invoice from [place]. Please contact [facility] at [phone] with questions or concerns regarding your invoice.   Our billing staff will not be able to assist you with questions regarding bills from these companies.  You will be contacted with the lab results as soon as they are available. The fastest way to get your results is to activate your My Chart account. Instructions are located on the last page of this paperwork. If you have not heard from [place] regarding the results in 2 weeks, please contact this office.     Hypersomnia Hypersomnia is when you feel extremely tired during the day even though you're getting plenty of sleep at night. You may need to take naps during the day, and you may also be extremely difficult to wake up when you are sleeping. What are the causes? The cause of your hypersomnia may not be known. Hypersomnia may be caused by:  Medicines.  Sleep disorders, such as narcolepsy.  Trauma or injury to your head or nervous system.  Using drugs or alcohol.  Tumors.  Medical conditions, such as depression or hypothyroidism.  Genetics. What are the signs or symptoms? The main symptoms of hypersomnia include:  Feeling extremely tired throughout the day.  Being very difficult to wake up.  Sleeping for longer and longer periods.  Taking naps throughout the day. Other symptoms may include:  Feeling:  Restless.  Annoyed.  Anxious.  Low energy.  Having difficulty:  Remembering.  Speaking.  Thinking.  Losing your appetite.  Experiencing hallucinations. How is this diagnosed? Hypersomnia may be diagnosed by:  Medical history and physical exam. This will include a sleep history.  Completing sleep  logs.  Tests may also be done, such as:  Polysomnography.  Multiple sleep latency test (MSLT). How is this treated? There is no cure for hypersomnia, but treatment can be very effective in helping manage the condition. Treatment may include:  Lifestyle and sleeping strategies to help cope with the condition.  Stimulant medicines.  Treating any underlying causes of hypersomnia. Follow these instructions at home:  Take medicines only as directed by your health care provider.  Schedule short naps for when you feel sleepiest during the day. Tell your employer or teachers that you have hypersomnia. You may be able to adjust your schedule to include time for naps.  Avoid drinking alcohol or caffeinated beverages.  Do not eat a heavy meal before bedtime. Eat at about the same times every day.  Do not drive or operate heavy machinery if you are sleepy.  Do not swim or go out on the water without a life jacket.  If possible, adjust your schedule so that you do not have to work or be active at night.  Keep all follow-up visits as directed by your health care provider. This is important. Contact a health care provider if:  You have new symptoms.  Your symptoms get worse. Get help right away if: You have serious thoughts of hurting yourself or someone else. This information is not intended to replace advice given to you by your health care provider. Make sure you discuss any questions you have with your health care provider. Document Released: 05/18/2002 Document Revised: 11/03/2015 Document Reviewed: 12/31/2013 Elsevier Interactive Patient Education  2017 [employer].  Bacterial Vaginosis Bacterial vaginosis is a vaginal infection that occurs when the normal balance of bacteria in the vagina is disrupted. It results from an overgrowth of certain bacteria. This is the most common vaginal infection among women ages 83-44. Because bacterial vaginosis increases your risk for STIs  (sexually transmitted infections), getting treated can help reduce your risk for chlamydia, gonorrhea, herpes, and HIV (human immunodeficiency virus). Treatment is also important for preventing complications in pregnant women, because this condition can cause an early (premature) delivery. What are the causes? This condition is caused by an increase in harmful bacteria that are normally present in small amounts in the vagina. However, the reason that the condition develops is not fully understood. What increases the risk? The following factors may make you more likely to develop this condition:  Having a new sexual partner or multiple sexual partners.  Having unprotected sex.  Douching.  Having an intrauterine device (IUD).  Smoking.  Drug and alcohol abuse.  Taking certain antibiotic medicines.  Being pregnant. You cannot get bacterial vaginosis from toilet seats, bedding, swimming pools, or contact with objects around you. What are the signs or symptoms? Symptoms of this condition include:  Grey or white vaginal discharge. The discharge can also be watery or foamy.  A fish-like odor with discharge, especially after sexual intercourse or during menstruation.  Itching in and around the vagina.  Burning or pain with urination. Some women with bacterial vaginosis have no signs or symptoms. How is this diagnosed? This condition is diagnosed based on:  Your medical history.  A physical exam of the vagina.  Testing a sample of vaginal fluid under a microscope to look for a large amount of bad bacteria or abnormal cells. Your health care provider may use a cotton swab or a small wooden spatula to collect the sample. How is this treated? This condition is treated with antibiotics. These may be given as a pill, a vaginal cream, or a medicine that is put into the vagina (suppository). If the condition comes back after treatment, a second round of antibiotics may be needed. Follow  these instructions at home: Medicines   Take over-the-counter and prescription medicines only as told by your health care provider.  Take or use your antibiotic as told by your health care provider. Do not stop taking or using the antibiotic even if you start to feel better. General instructions   If you have a female sexual partner, tell her that you have a vaginal infection. She should see her health care provider and be treated if she has symptoms. If you have a female sexual partner, he does not need treatment.  During treatment:  Avoid sexual activity until you finish treatment.  Do not douche.  Avoid alcohol as directed by your health care provider.  Avoid breastfeeding as directed by your health care provider.  Drink enough water and fluids to keep your urine clear or pale yellow.  Keep the area around your vagina and rectum clean.  Wash the area daily with warm water.  Wipe yourself from front to back after using the toilet.  Keep all follow-up visits as told by your health care provider. This is important. How is this prevented?  Do not douche.  Wash the outside of your vagina with warm water only.  Use protection when having sex. This includes latex condoms and dental dams.  Limit how many sexual partners you have. To help prevent bacterial vaginosis, it is best to have sex with just  one partner (monogamous).  Make sure you and your sexual partner are tested for STIs.  Wear cotton or cotton-lined underwear.  Avoid wearing tight pants and pantyhose, especially during summer.  Limit the amount of alcohol that you drink.  Do not use any products that contain nicotine or tobacco, such as cigarettes and e-cigarettes. If you need help quitting, ask your health care provider.  Do not use illegal drugs. Where to find more information:  Centers for Disease Control and Prevention: AppraiserFraud.fi  American Sexual Health Association (ASHA): www.ashastd.org  U.S.  Department of Health and Financial controller, Office on Women's Health: DustingSprays.pl or SecuritiesCard.it Contact a health care provider if:  Your symptoms do not improve, even after treatment.  You have more discharge or pain when urinating.  You have a fever.  You have pain in your abdomen.  You have pain during sex.  You have vaginal bleeding between periods. Summary  Bacterial vaginosis is a vaginal infection that occurs when the normal balance of bacteria in the vagina is disrupted.  Because bacterial vaginosis increases your risk for STIs (sexually transmitted infections), getting treated can help reduce your risk for chlamydia, gonorrhea, herpes, and HIV (human immunodeficiency virus). Treatment is also important for preventing complications in pregnant women, because the condition can cause an early (premature) delivery.  This condition is treated with antibiotic medicines. These may be given as a pill, a vaginal cream, or a medicine that is put into the vagina (suppository). This information is not intended to replace advice given to you by your health care provider. Make sure you discuss any questions you have with your health care provider. Document Released: 05/28/2005 Document Revised: 02/11/2016 Document Reviewed: 02/11/2016 Elsevier Interactive Patient Education  2017 [employer].

## 2016-11-01 LAB — URINE CULTURE

## 2016-11-07 ENCOUNTER — Encounter: Payer: BLUE CROSS/BLUE SHIELD | Admitting: Gastroenterology

## 2016-11-26 ENCOUNTER — Ambulatory Visit (HOSPITAL_COMMUNITY)
Admission: EM | Admit: 2016-11-26 | Discharge: 2016-11-26 | Disposition: A | Discharge status: Home / Self Care | Payer: BLUE CROSS/BLUE SHIELD | Attending: Family Medicine | Admitting: Family Medicine

## 2016-11-26 ENCOUNTER — Encounter (HOSPITAL_COMMUNITY): Payer: Self-pay | Admitting: Family Medicine

## 2016-11-26 DIAGNOSIS — Z23 Encounter for immunization: Secondary | ICD-10-CM

## 2016-11-26 DIAGNOSIS — S61211A Laceration without foreign body of left index finger without damage to nail, initial encounter: Secondary | ICD-10-CM

## 2016-11-26 DIAGNOSIS — W25XXXA Contact with sharp glass, initial encounter: Secondary | ICD-10-CM

## 2016-11-26 MED ORDER — TETANUS-DIPHTH-ACELL PERTUSSIS 5-2.5-18.5 LF-MCG/0.5 IM SUSP
0.5000 mL | Freq: Once | INTRAMUSCULAR | Status: AC
  Administered 2016-11-26: 0.5 mL via INTRAMUSCULAR

## 2016-11-26 MED ORDER — LIDOCAINE HCL 2 % IJ SOLN
INTRAMUSCULAR | Status: AC
  Filled 2016-11-26: qty 20

## 2016-11-26 MED ORDER — TETANUS-DIPHTH-ACELL PERTUSSIS 5-2.5-18.5 LF-MCG/0.5 IM SUSP
INTRAMUSCULAR | Status: AC
  Filled 2016-11-26: qty 0.5

## 2016-11-26 MED ORDER — CEPHALEXIN 500 MG PO CAPS: 500.0000 mg | ORAL_CAPSULE | Freq: Four times a day (QID) | ORAL | 0 refills | Status: DC

## 2016-11-26 NOTE — ED Provider Notes (Signed)
[place]    CSN: [id number] Arrival date & time: 11/26/16  1201     History   Chief Complaint Chief Complaint  Patient presents with  . Laceration    HPI [name] is a 51 y.o. female.   Pt  Sustained  A  Laceration To  left  Hand  Last  Night   While  Washing  Dishes  With  Glass.     Bleeding  Subsided.     Left  Pointer  Finger:  Unable to  [place].  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 She is uncertain about when she had her last technical shot.      Past Medical History:  Diagnosis Date  . Anemia     Patient Active Problem List   Diagnosis Date Noted  . Rectal bleeding 10/18/2016  . Special screening for malignant neoplasms, colon 10/18/2016  . Vitamin D deficiency 04/16/2015  . Low HDL (under 40) 04/16/2015    Past Surgical History:  Procedure Laterality Date  . ABDOMINAL HYSTERECTOMY    . CESAREAN SECTION      OB History    No data available       Home Medications    Prior to Admission medications   Not on File    Family History Family History  Problem Relation Age of Onset  . Hypertension Other   . Diabetes Other     Social History Social History  Substance Use Topics  . Smoking status: Current Every Day Smoker    Packs/day: 1.00    Years: 20.00    Types: Cigarettes  . Smokeless tobacco: Never Used  . Alcohol use No     Allergies   Hydrocodone   Review of Systems Review of Systems  Skin: Positive for wound.  All other systems reviewed and are negative.    Physical Exam Triage Vital Signs ED Triage Vitals [11/26/16 1236]  Enc Vitals Group     BP (!) 168/96     Pulse Rate 72     Resp 14     Temp 97.4 F (36.3 C)     Temp Source Oral     SpO2 100 %     Weight      Height      Head Circumference      Peak Flow      Pain Score      Pain Loc      Pain Edu?      Excl. in [place]?    No data  found.   Updated Vital Signs BP (!) 168/96 (BP Location: Right Arm) Comment: rn notrified  Pulse 72   Temp 97.4 F (36.3 C) (Oral)   Resp 14   SpO2 100%    Physical Exam  Constitutional: She is oriented to person, place, and time. She appears well-developed and well-nourished.  HENT:  Right Ear: External ear normal.  Left Ear: External ear normal.  Mouth/Throat: Oropharynx is clear and moist.  Eyes: Conjunctivae are normal. Pupils are equal, round, and reactive to light.  Neck: Normal range of motion. Neck supple.  Pulmonary/Chest: Effort normal.  Musculoskeletal: She exhibits tenderness.  Pain when moving left index finger  Neurological: She is alert and oriented to person, place, and time. No sensory deficit.  Skin: Skin is warm and dry. There is erythema.  Small amount of blood expressed  when bending finger. Laceration is about 1 cm long, full thickness, wound edges well approximated.  Psychiatric: She has a normal mood and affect.  Nursing note and vitals reviewed.    UC Treatments / Results  Labs (all labs ordered are listed, but only abnormal results are displayed) Labs Reviewed - No data to display  EKG  EKG Interpretation None       Radiology No results found.  Procedures .Marland KitchenLaceration Repair Date/Time: 11/26/2016 1:00 PM Performed by: Robyn Haber Authorized by: Robyn Haber   Consent:    Consent obtained:  Verbal   Consent given by:  Patient   Risks discussed:  Infection   Alternatives discussed:  No treatment Anesthesia (see MAR for exact dosages):    Anesthesia method:  Local infiltration   Local anesthetic:  Lidocaine 1% w/o epi Laceration details:    Location:  Finger   Finger location:  L index finger Repair type:    Repair type:  Simple Pre-procedure details:    Preparation:  Patient was prepped and draped in usual sterile fashion Exploration:    Hemostasis achieved with:  Direct pressure   Contaminated: no   Treatment:     Area cleansed with:  Betadine   Irrigation solution:  Tap water   Visualized foreign bodies/material removed: no   Skin repair:    Repair method:  Sutures   Suture size:  4-0   Suture technique:  Horizontal mattress   Number of sutures:  1 Approximation:    Approximation:  Close   Vermilion border: well-aligned   Post-procedure details:    Dressing:  Bulky dressing   Patient tolerance of procedure:  Tolerated well, no immediate complications   (including critical care time)  Medications Ordered in UC Medications  Tdap (BOOSTRIX) injection 0.5 mL (0.5 mLs Intramuscular Given 11/26/16 1254)     Initial Impression / Assessment and Plan / UC Course  I have reviewed the triage vital signs and the nursing notes.  Pertinent labs & imaging results that were available during my care of the patient were reviewed by me and considered in my medical decision making (see chart for details).     Final Clinical Impressions(s) / UC Diagnoses   Final diagnoses:  Laceration of left index finger without foreign body without damage to nail, initial encounter    New Prescriptions Current Discharge Medication List       Robyn Haber, MD 11/26/16 1303

## 2016-11-26 NOTE — ED Triage Notes (Signed)
Pt  Sustained  A  Laceration  To  l  Hand     Last  Night   While  Washing  Dishes   With  Glass     Bleeding  Subsided       l   Pointer    Unable to  [place]  The  Affected  Finger

## 2016-11-26 NOTE — Discharge Instructions (Signed)
Return in a week to take out the stitch

## 2016-12-07 ENCOUNTER — Emergency Department (HOSPITAL_COMMUNITY)
Admission: EM | Admit: 2016-12-07 | Discharge: 2016-12-07 | Disposition: A | Discharge status: Home / Self Care | Payer: BLUE CROSS/BLUE SHIELD | Attending: Emergency Medicine | Admitting: Emergency Medicine

## 2016-12-07 ENCOUNTER — Encounter (HOSPITAL_COMMUNITY): Payer: Self-pay | Admitting: Emergency Medicine

## 2016-12-07 DIAGNOSIS — X503XXA Overexertion from repetitive movements, initial encounter: Secondary | ICD-10-CM | POA: Insufficient documentation

## 2016-12-07 DIAGNOSIS — Y999 Unspecified external cause status: Secondary | ICD-10-CM | POA: Diagnosis not present

## 2016-12-07 DIAGNOSIS — S86111A Strain of other muscle(s) and tendon(s) of posterior muscle group at lower leg level, right leg, initial encounter: Secondary | ICD-10-CM

## 2016-12-07 DIAGNOSIS — J302 Other seasonal allergic rhinitis: Secondary | ICD-10-CM | POA: Diagnosis not present

## 2016-12-07 DIAGNOSIS — Y92811 Bus as the place of occurrence of the external cause: Secondary | ICD-10-CM | POA: Diagnosis not present

## 2016-12-07 DIAGNOSIS — J340 Abscess, furuncle and carbuncle of nose: Secondary | ICD-10-CM | POA: Diagnosis not present

## 2016-12-07 DIAGNOSIS — F17210 Nicotine dependence, cigarettes, uncomplicated: Secondary | ICD-10-CM | POA: Diagnosis not present

## 2016-12-07 DIAGNOSIS — Y9302 Activity, running: Secondary | ICD-10-CM | POA: Insufficient documentation

## 2016-12-07 DIAGNOSIS — S8991XA Unspecified injury of right lower leg, initial encounter: Secondary | ICD-10-CM | POA: Diagnosis present

## 2016-12-07 MED ORDER — SULFAMETHOXAZOLE-TRIMETHOPRIM 800-160 MG PO TABS: 1.0000 | ORAL_TABLET | Freq: Two times a day (BID) | ORAL | 0 refills | Status: DC

## 2016-12-07 MED ORDER — NEOMYCIN-POLYMYXIN-HC 3.5-10000-1 OT SUSP
4.0000 [drp] | Freq: Four times a day (QID) | OTIC | Status: DC
  Administered 2016-12-07: 4 [drp] via OTIC
  Filled 2016-12-07: qty 10

## 2016-12-07 MED ORDER — MUPIROCIN CALCIUM 2 % NA OINT: TOPICAL_OINTMENT | NASAL | 0 refills | Status: DC

## 2016-12-07 MED ORDER — NAPROXEN 375 MG PO TABS: 375.0000 mg | ORAL_TABLET | Freq: Two times a day (BID) | ORAL | 0 refills | Status: DC

## 2016-12-07 MED ORDER — CETIRIZINE HCL 10 MG PO TABS: 10.0000 mg | ORAL_TABLET | Freq: Every day | ORAL | 0 refills | Status: DC

## 2016-12-07 MED ORDER — OXYCODONE-ACETAMINOPHEN 5-325 MG PO TABS
1.0000 | ORAL_TABLET | Freq: Once | ORAL | Status: AC
  Administered 2016-12-07: 1 via ORAL
  Filled 2016-12-07: qty 1

## 2016-12-07 MED ORDER — NAPROXEN 375 MG PO TABS
375.0000 mg | ORAL_TABLET | Freq: Once | ORAL | Status: AC
  Administered 2016-12-07: 375 mg via ORAL
  Filled 2016-12-07: qty 1

## 2016-12-07 MED ORDER — OXYCODONE-ACETAMINOPHEN 5-325 MG PO TABS: 1.0000 | ORAL_TABLET | ORAL | 0 refills | Status: DC | PRN

## 2016-12-07 NOTE — ED Provider Notes (Signed)
[place] DEPT Provider Note   CSN: [id number] Arrival date & time: 12/07/16  0331     History   Chief Complaint Chief Complaint  Patient presents with  . URI  . Leg Pain    HPI [name] is a 51 y.o. female.  Patient presents with complaint of leg pain that had a sudden onset yesterday. She reports she was running to the city bus and when she used her right leg to step up into the bus she felt a sharp pain in her calf. No significant swelling. No fall or direct trauma. She also reports allergy symptoms of congestion, facial pressure, eye irritation. These symptoms are chronic but she does not take any regular medication for relief. Over the last 2 days she has developed pain and swelling to the tip of her nose. No bleeding. No cough or sore throat. No fever.    The history is provided by the patient. No language interpreter was used.    Past Medical History:  Diagnosis Date  . Anemia     Patient Active Problem List   Diagnosis Date Noted  . Rectal bleeding 10/18/2016  . Special screening for malignant neoplasms, colon 10/18/2016  . Vitamin D deficiency 04/16/2015  . Low HDL (under 40) 04/16/2015    Past Surgical History:  Procedure Laterality Date  . ABDOMINAL HYSTERECTOMY    . CESAREAN SECTION      OB History    No data available       Home Medications    Prior to Admission medications   Medication Sig Start Date End Date Taking? Authorizing Provider  cephALEXin (KEFLEX) 500 MG capsule Take 1 capsule (500 mg total) by mouth 4 (four) times daily. 11/26/16   Robyn Haber, MD    Family History Family History  Problem Relation Age of Onset  . Hypertension Other   . Diabetes Other     Social History Social History  Substance Use Topics  . Smoking status: Current Every Day Smoker    Packs/day: 1.00    Years: 20.00    Types: Cigarettes  . Smokeless tobacco: Never Used  . Alcohol use No     Allergies   Hydrocodone   Review of  Systems Review of Systems  Constitutional: Negative for chills and fever.  HENT: Positive for sinus pressure. Negative for facial swelling, nosebleeds and sore throat.        See HPI.  Eyes:       See HPI.  Respiratory: Negative.   Cardiovascular: Negative.   Gastrointestinal: Negative.   Musculoskeletal:       C/O right leg pain   Skin: Negative.  Negative for color change and wound.  Neurological: Negative.  Negative for weakness and numbness.     Physical Exam Updated Vital Signs BP 136/85 (BP Location: Left Arm)   Pulse 90   Temp 97.7 F (36.5 C) (Oral)   Resp 18   SpO2 96%   Physical Exam  Constitutional: She is oriented to person, place, and time. She appears well-developed and well-nourished.  HENT:  Head: Normocephalic.  Distal nose red, swollen and significantly tender. No visualized intranasal abscess. No purulent nasal drainage or bleeding.   Neck: Normal range of motion. Neck supple.  Cardiovascular: Normal rate, regular rhythm and intact distal pulses.   Pulmonary/Chest: Effort normal and breath sounds normal. She has no wheezes. She has no rales.  Abdominal: Soft. Bowel sounds are normal. There is no tenderness. There is no rebound and  no guarding.  Musculoskeletal: Normal range of motion.  Right lower leg tender over proximal calf. No muscular deformity. Achilles tendon intact. FROM with pain of the knee and ankle.   Neurological: She is alert and oriented to person, place, and time. No sensory deficit.  Skin: Skin is warm and dry. No rash noted.  Right lower leg without ecchymosis, redness or warmth.  Psychiatric: She has a normal mood and affect.     ED Treatments / Results  Labs (all labs ordered are listed, but only abnormal results are displayed) Labs Reviewed - No data to display  EKG  EKG Interpretation None       Radiology No results found.  Procedures Procedures (including critical care time)  Medications Ordered in  ED Medications  neomycin-polymyxin-hydrocortisone (CORTISPORIN) OTIC (EAR) suspension 4 drop (not administered)  oxyCODONE-acetaminophen (PERCOCET/ROXICET) 5-325 MG per tablet 1 tablet (not administered)     Initial Impression / Assessment and Plan / ED Course  I have reviewed the triage vital signs and the nursing notes.  Pertinent labs & imaging results that were available during my care of the patient were reviewed by me and considered in my medical decision making (see chart for details).     Patient with right leg pain after running yesterday. There is no evidence of compartment syndrome, tendon or muscle rupture. Suspect gastroc strain requiring supportive care.   She has symptoms of allergies with new distal nasal pain c/w localized cellulitis vs intranasal abscess not visualized, favor abscess. Will prescribe oral and topical abx, pain management and anti-inflammatory. Recommended PCP follow up.  Final Clinical Impressions(s) / ED Diagnoses   Final diagnoses:  None   1. Right Gastrocnemius strain 2.  Intranasal abscess 3. Seasonal allergies  New Prescriptions New Prescriptions   No medications on file     Charlann Lange, Hershal Coria 12/07/16 [place], [place], MD 12/07/16 [phone]

## 2016-12-07 NOTE — ED Triage Notes (Signed)
Pt states she thinks she has a sinus infection because her nose is running and congested and red and it hurts to even touch  Pt is also c/o right leg pain  Pt states she was running back to the bus and hurt it  Pt states the pain is in the calf area but now it is shooting up her whole leg

## 2016-12-13 ENCOUNTER — Ambulatory Visit: Payer: BLUE CROSS/BLUE SHIELD | Admitting: Family Medicine

## 2016-12-14 ENCOUNTER — Encounter: Payer: Self-pay | Admitting: Family Medicine

## 2016-12-14 ENCOUNTER — Ambulatory Visit (INDEPENDENT_AMBULATORY_CARE_PROVIDER_SITE_OTHER): Payer: BLUE CROSS/BLUE SHIELD | Admitting: Family Medicine

## 2016-12-14 VITALS — BP 114/77 | HR 62 | Temp 97.6°F | Resp 18 | Ht 64.0 in | Wt 155.4 lb

## 2016-12-14 DIAGNOSIS — S86111A Strain of other muscle(s) and tendon(s) of posterior muscle group at lower leg level, right leg, initial encounter: Secondary | ICD-10-CM

## 2016-12-14 MED ORDER — NAPROXEN 375 MG PO TABS: 375.0000 mg | ORAL_TABLET | Freq: Two times a day (BID) | ORAL | 1 refills | Status: DC

## 2016-12-14 NOTE — Patient Instructions (Addendum)
We do not have the tall boot that I would like to put you in. Continue to wrap an ace wrap around your calf or you can get a compression sleeve - you should have this on all the time.  Ice for 20 minutes four times a day.   Recheck in 1 week. USE YOUR CRUTCHES.     We recommend that you schedule a mammogram for breast cancer screening. Typically, you do not need a referral to do this. Please contact a local imaging center to schedule your mammogram.  [facility] [place] - [phone]  *ask for the Radiology Department [facility] ([place]) - [phone] or [phone]  [facility] - [phone] [place] [phone] [facility]  - [phone]  *ask for the [facility] - [phone]  *ask for the Radiology Department [facility] - [phone]  *ask for the [facility] - [phone]     IF you received an x-ray today, you will receive an invoice from [facility] Radiology. Please contact [facility] at [phone] with questions or concerns regarding your invoice.   IF you received labwork today, you will receive an invoice from [facility]. Please contact [facility] at [phone] with questions or concerns regarding your invoice.   Our billing staff will not be able to assist you with questions regarding bills from these companies.  You will be contacted with the lab results as soon as they are available. The fastest way to get your results is to activate your My Chart account. Instructions are located on the last page of this paperwork. If you have not heard from [place] regarding the results in 2 weeks, please contact this office.    d Medial Head Gastrocnemius Tear Medial head gastrocnemius tear, also called tennis leg, is an injury to the inner part of the calf muscle. This injury may include overstretching of the muscle or a  partial or complete tear. This is a common sports injury. Calf muscle tears usually occur near the back of the knee. This often causes sudden pain and muscle weakness. What are the causes? This condition is caused by forceful stretching or strain on the calf muscle. This usually happens when you forcefully push off of your foot. It may also happen if you forcefully straighten your knee while your foot is flat on the ground. What increases the risk? The following factors may make you more likely to develop this condition:  Being female and older than age 26.  Playing sports that involve: ? Quick increases in speed and changes of direction, such as tennis and soccer. ? Jumping, such as basketball. ? Running, especially uphill or on uneven ground.  What are the signs or symptoms? Symptoms of this condition include:  Sudden pain in the back of the leg when the injury occurs. You may hear a popping sound or feel like you got hit in your calf.  Pain that is made worse by bringing your toes up toward your shin or by straightening your knee.  Pain on the inside of your calf, from your knee to your ankle.  Not being able to rise up on your toes.  Pain when pressing on your calf muscle.  Swelling of your calf.  Bruising along your calf and lower leg, down to your ankle.  Difficulty pushing off your foot when walking or using stairs.  How is this diagnosed? This condition may be diagnosed  based on:  Your symptoms and medical history.  A physical exam. Your health care provider may be able to feel a lump or a defect in your muscle.  MRI or ultrasound to determine the severity and exact location of your injury.  How is this treated? Treatment for this condition may include:  Resting the muscle and keeping weight off your leg for several days. During this time, you may use crutches or another walking device.  Using a splint to keep your ankle or knee in a stable position.  Wearing a  walking boot to decrease the use of your gastrocnemius muscle.  Using a wedge under your heel to reduce stretching of your healing muscle.  Wearing a compression sleeve around your calf muscle.  Icing the muscle.  Raising (elevating) your leg when resting.  Taking medicine for pain and swelling (NSAIDs or steroids).  Doing leg exercises as told by your health care provider or physical therapist.  Follow these instructions at home: If you have a splint or compression sleeve:  Wear it as told by your health care provider. Remove it only as told by your health care provider.  Loosen the splint if your toes tingle, become numb, or turn cold and blue.  If your splint or sleeve is not waterproof: ? Do not let it get wet. ? Protect it with a watertight covering when you take a bath or a shower.  Keep the splint or sleeve clean. Managing pain, stiffness, and swelling  If directed, put ice on the injured area: ? Put ice in a plastic bag. ? Place a towel between your skin and the bag. ? Leave the ice on for 20 minutes, 2-3 times a day.  Move your toes often to avoid stiffness and to lessen swelling.  Raise (elevate) the injured area above the level of your heart while you are sitting or lying down. Driving  Do not drive or operate heavy machinery while taking prescription pain medicine.  Ask your health care provider when it is safe to drive. Activity  Do not use the injured limb to support your body weight until your health care provider says that you can. Use crutches as told by your health care provider.  Return gradually to your normal activities as told by your health care provider. Ask your health care provider what activities are safe for you.  Do exercises as told by your health care provider.  Return to sporting activity only as told by your health care provider or physical therapist. Full recovery may take several months. General instructions  Take over-the-counter  and prescription medicines only as told by your health care provider.  Keep all follow-up visits as told by your health care provider. This is important. How is this prevented?  Warm up and stretch before being active.  Cool down and stretch after being active.  Give your body time to rest between periods of activity.  Make sure to use equipment that fits you.  Be safe and responsible while being active to avoid falls.  Maintain physical fitness, including: ? Strength. ? Flexibility. Contact a health care provider if:  Your symptoms do not improve with rest and treatment. Get help right away if:  You have swelling or redness in your calf that is getting worse. This information is not intended to replace advice given to you by your health care provider. Make sure you discuss any questions you have with your health care provider. Document Released: 05/28/2005 Document Revised: 01/31/2016  Document Reviewed: 05/10/2015 Elsevier Interactive Patient Education  [employer].

## 2016-12-14 NOTE — Progress Notes (Signed)
Subjective:    Patient ID: [name], female    DOB: 1965-07-10, 51 y.o.   MRN: [id number] Chief Complaint  Patient presents with  . Leg Pain    On 12/07/2016 pt was running up some stair and missed a step and when she went to catch herself her with her right leg. She felt a pop and went to the ED.    HPI   When she dorsiflexes her foot has pain radiating up the whole incide of her leg.  Out of work since the injury. She was given crutches and ace wrap.  Will ice, then hot bath, and wrap which helps her sleep.  She was naproxen bid but not using the oxycodone.    Past Medical History:  Diagnosis Date  . Anemia    Past Surgical History:  Procedure Laterality Date  . ABDOMINAL HYSTERECTOMY    . CESAREAN SECTION     Current Outpatient Prescriptions on File Prior to Visit  Medication Sig Dispense Refill  . cephALEXin (KEFLEX) 500 MG capsule Take 1 capsule (500 mg total) by mouth 4 (four) times daily. 20 capsule 0  . cetirizine (ZYRTEC ALLERGY) 10 MG tablet Take 1 tablet (10 mg total) by mouth daily. 20 tablet 0  . mupirocin nasal ointment (BACTROBAN) 2 % Apply in each nostril daily 10 g 0  . sulfamethoxazole-trimethoprim (BACTRIM DS,SEPTRA DS) 800-160 MG tablet Take 1 tablet by mouth 2 (two) times daily. 14 tablet 0  . naproxen (NAPROSYN) 375 MG tablet Take 1 tablet (375 mg total) by mouth 2 (two) times daily. (Patient not taking: Reported on 12/14/2016) 20 tablet 0  . oxyCODONE-acetaminophen (PERCOCET/ROXICET) 5-325 MG tablet Take 1 tablet by mouth every 4 (four) hours as needed for severe pain. (Patient not taking: Reported on 12/14/2016) 6 tablet 0   No current facility-administered medications on file prior to visit.    Allergies  Allergen Reactions  . Hydrocodone Itching   Family History  Problem Relation Age of Onset  . Hypertension Other   . Diabetes Other    Social History   Social History  . Marital status: Single    Spouse name: N/A  . Number of children: 2    . Years of education: N/A   Occupational History  . Scientist, forensic     Social History Main Topics  . Smoking status: Current Every Day Smoker    Packs/day: 1.00    Years: 20.00    Types: Cigarettes  . Smokeless tobacco: Never Used  . Alcohol use No  . Drug use: No  . Sexual activity: Yes    Birth control/ protection: None   Other Topics Concern  . None   Social History Narrative  . None     Review of Systems     Objective:   Physical Exam  Constitutional: She is oriented to person, place, and time. She appears well-developed and well-nourished. No distress.  HENT:  Head: Normocephalic and atraumatic.  Right Ear: External ear normal.  Eyes: Conjunctivae are normal. No scleral icterus.  Pulmonary/Chest: Effort normal.  Musculoskeletal:  Bruising over medial aspect of right calf in 6-7 in linear pattern  Neurological: She is alert and oriented to person, place, and time.  Skin: Skin is warm and dry. She is not diaphoretic. No erythema.  Psychiatric: She has a normal mood and affect. Her behavior is normal.      BP 114/77   Pulse 62   Temp 97.6 F (36.4 C) (Oral)  Resp 18   Ht 5\' 4"  (1.626 m)   Wt 155 lb 6.4 oz (70.5 kg)   SpO2 100%   BMI 26.67 kg/m      Assessment & Plan:   1. Gastrocnemius muscle tear, right, initial encounter     Meds ordered this encounter  Medications  . naproxen (NAPROSYN) 375 MG tablet    Sig: Take 1 tablet (375 mg total) by mouth 2 (two) times daily with a meal.    Dispense:  60 tablet    Refill:  1      [name], M.D.  Primary Care at [facility] [address] [place], [place] [address] [phone] phone [phone] fax  12/17/16 8:20 AM     One week follow-up: Any patient with a severe gastrocnemius strain should follow up within one week. A gastrocnemius strain is considered severe if the patient cannot walk (ie, requires crutches), or the injury is associated with marked swelling or severe pain.  At the first follow-up visit, the clinician determines if swelling has subsided and can repeat an ultrasound examination to assess the size of the tear and any hematoma. Identification of a significant hematoma on ultrasound indicates a need to continue regular icing and use of a compression sleeve (picture 7), and to use a more gradual timetable for introducing full weight-bearing and rehabilitation exercises. Additional interventions depend upon the patient's symptoms and functional capacity. If possible, the patient should stop using crutches. Once they can walk without a significant limp, the patient should start taking walks three times each day for 10 minutes at a time. If they can perform a heel raise standing on both legs without undue pain, the patient should start performing this exercise regularly, starting with a single set of five to eight repetitions performed with the knees bent, and then repeating this with the knees straight. Gradually, the patient may increase the number of sets and repetitions, assuming these can be performed pain-free, with the ultimate goal of being able to perform three sets of 15 repetitions with the knees straight, and then repeating this with the knees bent. The patient should continue wearing a compression sleeve on the affected calf, using heel lifts, and applying ice four times per day for 20 minutes at a time until swelling subsides. Stretching exercises do not aid healing and should be avoided during rehabilitation; stretching can exacerbate the muscle tears sustained at the time of injury. Three week follow-up: If the patient demonstrates steady improvement in symptoms and function at the three week follow-up visit, they should begin performing the Alfredson protocol for Achilles tendon injury. This protocol is described in detail separately and summarized in the accompanying table (table 1). (See "Achilles tendinopathy and tendon rupture", section on  'Rehabilitation using resistance exercise'.) When the patient is able to perform eccentric heel drops for three sets of 15 repetitions on one leg, he or she may begin easy running every other day on a level surface, not to exceed 15 to 20 minutes per session. Additional aerobic exercise can be done on a bicycle or stationary bicycle. Knowledgeable clinicians can monitor a patient's clinical progress with ultrasound. Six week follow-up: If the patient continues to demonstrate meaningful progress at the six week follow-up visit, allow them to start running for longer times and distances, but still on a level surface. As a rule of thumb, increases in running volume should not exceed five minutes per session during a given week, and no more than one  additional running day should be added each week. As an example, if the patient ran three times for 20 minutes each session during week one, they could increase this to four times for 25 minutes each session during week two. The patient should continue to perform heel raise exercises as per the final stage of the Alfredson protocol. Ultrasound can be used to establish if the injury is fully healed. Be sure to observe the patient's running gait to be sure there is no limp or other sign of discomfort. In most cases, 8 to 12 weeks are required for the patient to return to full sports activity. The patient should continue to use heel lifts and compression sleeves during activity for the first three months after their return. Thereafter, we leave it up to the patient whether they wish to continue using them.

## 2016-12-18 ENCOUNTER — Telehealth: Payer: Self-pay | Admitting: Family Medicine

## 2016-12-18 NOTE — Telephone Encounter (Signed)
Patient needs FMLA forms completed by Dr Brigitte Pulse for her right leg injury form 12/14/16. I have completed what I could from the [facility] notes and highlighted the areas I was not sure about. I will place the forms in your box on 12/18/16 please return them to the FMLA/Disability box at the [employer] checkout desk within 5-7 business days. thank you!

## 2016-12-19 ENCOUNTER — Encounter: Payer: BLUE CROSS/BLUE SHIELD | Admitting: Gastroenterology

## 2016-12-20 NOTE — Telephone Encounter (Signed)
Paperwork scanned and faxed on 12/20/16

## 2016-12-20 NOTE — Telephone Encounter (Signed)
Form completed.

## 2016-12-21 ENCOUNTER — Encounter: Payer: BLUE CROSS/BLUE SHIELD | Admitting: Gastroenterology

## 2016-12-24 ENCOUNTER — Institutional Professional Consult (permissible substitution): Payer: BLUE CROSS/BLUE SHIELD | Admitting: Neurology

## 2017-01-10 ENCOUNTER — Telehealth: Payer: Self-pay | Admitting: Family Medicine

## 2017-01-10 NOTE — Telephone Encounter (Signed)
Sheena from [facility] called stated that she called the pt 3 times to try and schedule an appt but the pt is not answering and is now marked as unable to be reached. Vita Barley can be reached at [phone] for any questions or concerns.

## 2017-01-25 ENCOUNTER — Encounter: Payer: BLUE CROSS/BLUE SHIELD | Admitting: Gastroenterology

## 2017-03-08 ENCOUNTER — Ambulatory Visit (INDEPENDENT_AMBULATORY_CARE_PROVIDER_SITE_OTHER): Payer: BLUE CROSS/BLUE SHIELD | Admitting: Urgent Care

## 2017-03-08 VITALS — BP 136/84 | HR 82 | Temp 97.9°F | Resp 16 | Ht 64.0 in | Wt 169.0 lb

## 2017-03-08 DIAGNOSIS — Z23 Encounter for immunization: Secondary | ICD-10-CM

## 2017-03-08 DIAGNOSIS — S86111D Strain of other muscle(s) and tendon(s) of posterior muscle group at lower leg level, right leg, subsequent encounter: Secondary | ICD-10-CM

## 2017-03-08 DIAGNOSIS — M79661 Pain in right lower leg: Secondary | ICD-10-CM

## 2017-03-08 MED ORDER — KETOROLAC TROMETHAMINE 60 MG/2ML IM SOLN
60.0000 mg | Freq: Once | INTRAMUSCULAR | Status: AC
  Administered 2017-03-08: 60 mg via INTRAMUSCULAR

## 2017-03-08 MED ORDER — CELECOXIB 100 MG PO CAPS: 100.0000 mg | ORAL_CAPSULE | Freq: Two times a day (BID) | ORAL | 1 refills | Status: DC

## 2017-03-08 NOTE — Patient Instructions (Addendum)
Medial Head Gastrocnemius Tear Medial head gastrocnemius tear, also called tennis leg, is an injury to the inner part of the calf muscle. This injury may include overstretching of the muscle or a partial or complete tear. This is a common sports injury. Calf muscle tears usually occur near the back of the knee. This often causes sudden pain and muscle weakness. What are the causes? This condition is caused by forceful stretching or strain on the calf muscle. This usually happens when you forcefully push off of your foot. It may also happen if you forcefully straighten your knee while your foot is flat on the ground. What increases the risk? The following factors may make you more likely to develop this condition:  Being female and older than age 40.  Playing sports that involve: ? Quick increases in speed and changes of direction, such as tennis and soccer. ? Jumping, such as basketball. ? Running, especially uphill or on uneven ground.  What are the signs or symptoms? Symptoms of this condition include:  Sudden pain in the back of the leg when the injury occurs. You may hear a popping sound or feel like you got hit in your calf.  Pain that is made worse by bringing your toes up toward your shin or by straightening your knee.  Pain on the inside of your calf, from your knee to your ankle.  Not being able to rise up on your toes.  Pain when pressing on your calf muscle.  Swelling of your calf.  Bruising along your calf and lower leg, down to your ankle.  Difficulty pushing off your foot when walking or using stairs.  How is this diagnosed? This condition may be diagnosed based on:  Your symptoms and medical history.  A physical exam. Your health care provider may be able to feel a lump or a defect in your muscle.  MRI or ultrasound to determine the severity and exact location of your injury.  How is this treated? Treatment for this condition may include:  Resting the muscle  and keeping weight off your leg for several days. During this time, you may use crutches or another walking device.  Using a splint to keep your ankle or knee in a stable position.  Wearing a walking boot to decrease the use of your gastrocnemius muscle.  Using a wedge under your heel to reduce stretching of your healing muscle.  Wearing a compression sleeve around your calf muscle.  Icing the muscle.  Raising (elevating) your leg when resting.  Taking medicine for pain and swelling (NSAIDs or steroids).  Doing leg exercises as told by your health care provider or physical therapist.  Follow these instructions at home: If you have a splint or compression sleeve:  Wear it as told by your health care provider. Remove it only as told by your health care provider.  Loosen the splint if your toes tingle, become numb, or turn cold and blue.  If your splint or sleeve is not waterproof: ? Do not let it get wet. ? Protect it with a watertight covering when you take a bath or a shower.  Keep the splint or sleeve clean. Managing pain, stiffness, and swelling  If directed, put ice on the injured area: ? Put ice in a plastic bag. ? Place a towel between your skin and the bag. ? Leave the ice on for 20 minutes, 2-3 times a day.  Move your toes often to avoid stiffness and to lessen swelling.    Raise (elevate) the injured area above the level of your heart while you are sitting or lying down. Driving  Do not drive or operate heavy machinery while taking prescription pain medicine.  Ask your health care provider when it is safe to drive. Activity  Do not use the injured limb to support your body weight until your health care provider says that you can. Use crutches as told by your health care provider.  Return gradually to your normal activities as told by your health care provider. Ask your health care provider what activities are safe for you.  Do exercises as told by your health  care provider.  Return to sporting activity only as told by your health care provider or physical therapist. Full recovery may take several months. General instructions  Take over-the-counter and prescription medicines only as told by your health care provider.  Keep all follow-up visits as told by your health care provider. This is important. How is this prevented?  Warm up and stretch before being active.  Cool down and stretch after being active.  Give your body time to rest between periods of activity.  Make sure to use equipment that fits you.  Be safe and responsible while being active to avoid falls.  Maintain physical fitness, including: ? Strength. ? Flexibility. Contact a health care provider if:  Your symptoms do not improve with rest and treatment. Get help right away if:  You have swelling or redness in your calf that is getting worse. This information is not intended to replace advice given to you by your health care provider. Make sure you discuss any questions you have with your health care provider. Document Released: 05/28/2005 Document Revised: 01/31/2016 Document Reviewed: 05/10/2015 Elsevier Interactive Patient Education  2018 [employer].    Celecoxib capsules What is this medicine? CELECOXIB (sell a KOX ib) is a non-steroidal anti-inflammatory drug (NSAID). This medicine is used to treat arthritis and ankylosing spondylitis. It may be also used for pain or painful monthly periods. This medicine may be used for other purposes; ask your health care provider or pharmacist if you have questions. COMMON BRAND NAME(S): Celebrex What should I tell my health care provider before I take this medicine? They need to know if you have any of these conditions: -asthma -coronary artery bypass graft (CABG) surgery within the past 2 weeks -drink more than 3 alcohol-containing drinks a day -heart disease or circulation problems like heart failure or leg edema  (fluid retention) -high blood pressure -kidney disease -liver disease -stomach bleeding or ulcers -an unusual or allergic reaction to celecoxib, sulfa drugs, aspirin, other NSAIDs, other medicines, foods, dyes, or preservatives -pregnant or trying to get pregnant -breast-feeding How should I use this medicine? Take this medicine by mouth with a full glass of water. Follow the directions on the prescription label. Take it with food if it upsets your stomach or if you take 400 mg at one time. Try to not lie down for at least 10 minutes after you take the medicine. Take the medicine at the same time each day. Do not take more medicine than you are told to take. Long-term, continuous use may increase the risk of heart attack or stroke. A special MedGuide will be given to you by the pharmacist with each prescription and refill. Be sure to read this information carefully each time. Talk to your pediatrician regarding the use of this medicine in children. Special care may be needed. Overdosage: If you think you have taken  too much of this medicine contact a poison control center or emergency room at once. NOTE: This medicine is only for you. Do not share this medicine with others. What if I miss a dose? If you miss a dose, take it as soon as you can. If it is almost time for your next dose, take only that dose. Do not take double or extra doses. What may interact with this medicine? Do not take this medicine with any of the following medications: -cidofovir -methotrexate -other NSAIDs, medicines for pain and inflammation, like ibuprofen or naproxen -pemetrexed This medicine may also interact with the following medications: -alcohol -aspirin and aspirin-like drugs -diuretics -fluconazole -lithium -medicines for high blood pressure -steroid medicines like prednisone or cortisone -warfarin This list may not describe all possible interactions. Give your health care provider a list of all the  medicines, herbs, non-prescription drugs, or dietary supplements you use. Also tell them if you smoke, drink alcohol, or use illegal drugs. Some items may interact with your medicine. What should I watch for while using this medicine? Tell your doctor or health care professional if your pain does not get better. Talk to your doctor before taking another medicine for pain. Do not treat yourself. This medicine does not prevent heart attack or stroke. In fact, this medicine may increase the chance of a heart attack or stroke. The chance may increase with longer use of this medicine and in people who have heart disease. If you take aspirin to prevent heart attack or stroke, talk with your doctor or health care professional. Do not take medicines such as ibuprofen and naproxen with this medicine. Side effects such as stomach upset, nausea, or ulcers may be more likely to occur. Many medicines available without a prescription should not be taken with this medicine. This medicine can cause ulcers and bleeding in the stomach and intestines at any time during treatment. Ulcers and bleeding can happen without warning symptoms and can cause death. What side effects may I notice from receiving this medicine? Side effects that you should report to your doctor or health care professional as soon as possible: -allergic reactions like skin rash, itching or hives, swelling of the face, lips, or tongue -black or bloody stools, blood in the urine or vomit -blurred vision -breathing problems -chest pain -nausea, vomiting -problems with balance, talking, walking -redness, blistering, peeling or loosening of the skin, including inside the mouth -unexplained weight gain or swelling -unusually weak or tired -yellowing of eyes, skin Side effects that usually do not require medical attention (report to your doctor or health care professional if they continue or are bothersome): -constipation or diarrhea -dizziness -gas  or heartburn -upset stomach This list may not describe all possible side effects. Call your doctor for medical advice about side effects. You may report side effects to FDA at 1-800-FDA-1088. Where should I keep my medicine? Keep out of the reach of children. Store at room temperature between 15 and 30 degrees C (59 and 86 degrees F). Keep container tightly closed. Throw away any unused medicine after the expiration date. NOTE: This sheet is a summary. It may not cover all possible information. If you have questions about this medicine, talk to your doctor, pharmacist, or health care provider.  2018 Elsevier/Gold Standard (2009-07-27 10:54:17)     IF you received an x-ray today, you will receive an invoice from [facility]. Please contact [facility] at [phone] with questions or concerns regarding your invoice.   IF you received  labwork today, you will receive an invoice from [facility]. Please contact [facility] at [phone] with questions or concerns regarding your invoice.   Our billing staff will not be able to assist you with questions regarding bills from these companies.  You will be contacted with the lab results as soon as they are available. The fastest way to get your results is to activate your My Chart account. Instructions are located on the last page of this paperwork. If you have not heard from [place] regarding the results in 2 weeks, please contact this office.

## 2017-03-08 NOTE — Progress Notes (Signed)
   MRN: [id number] DOB: 1965/10/08  Subjective:   [name] is a 51 y.o. female presenting for right lower leg pain since yesterday. Patient had woken up, stepped out of bed and has since felt severe calf pain. Patient has been seen by Dr. Brigitte Pulse for gastrocnemius tear, last OV was 12/14/2016. Today, she reports that she is having wear her walking boot again, this is the only relief she is getting. 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 Has used ibuprofen with minimal relief, has also used naproxen but stopped due to drowsiness. Denies fever, redness, swelling, trauma, falls. Denies recent long distance travel, surgeries, history of dvt.  [name] has a current medication list which includes the following prescription(s): cetirizine, mupirocin nasal ointment, and naproxen. Also is allergic to hydrocodone.  [name]  has a past medical history of Anemia. Also  has a past surgical history that includes Cesarean section and Abdominal hysterectomy.  Objective:   Vitals: BP 136/84   Pulse 82   Temp 97.9 F (36.6 C) (Oral)   Resp 16   Ht 5\' 4"  (1.626 m)   Wt 169 lb (76.7 kg)   SpO2 99%   BMI 29.01 kg/m   Physical Exam  Constitutional: She is oriented to person, place, and time. She appears well-developed and well-nourished.  Cardiovascular: Normal rate.   Pulmonary/Chest: Effort normal.  Musculoskeletal:       Right lower leg: She exhibits tenderness (over gastrocnemius distribution). She exhibits no bony tenderness, no swelling, no edema, no deformity and no laceration.       Legs: Neurological: She is alert and oriented to person, place, and time.   Assessment and Plan :   1. Right calf pain 2. Gastrocnemius tear, right, subsequent encounter - Will start patient on Celebrex. Counseled patient on potential for adverse effects with medications prescribed today, patient verbalized understanding. - Ambulatory referral to Orthopedic Surgery is pending.  3. Need for influenza  vaccination - Flu Vaccine QUAD 6+ mos PF IM (Fluarix Quad PF)   Jaynee Eagles, PA-C [facility] [phone] 03/08/2017 2:30 PM

## 2017-03-12 ENCOUNTER — Telehealth: Payer: Self-pay | Admitting: Urgent Care

## 2017-03-12 NOTE — Telephone Encounter (Signed)
Patient needs forms completed for FMLA for her time out of work from 03/08/17 to 03/11/17 for her calf pain. I have completed the forms based off the Ov notes and highlighted the area that needs to be signed . I will place the forms in Mani's box on 03/12/17 please return to the FMLA/Disability box at the [employer] checkout desk within 5-7 business days. Thank you!

## 2017-03-20 ENCOUNTER — Emergency Department (HOSPITAL_COMMUNITY)
Admission: EM | Admit: 2017-03-20 | Discharge: 2017-03-20 | Disposition: A | Discharge status: Home / Self Care | Payer: Worker's Compensation | Attending: Emergency Medicine | Admitting: Emergency Medicine

## 2017-03-20 ENCOUNTER — Emergency Department (HOSPITAL_COMMUNITY): Payer: Worker's Compensation

## 2017-03-20 DIAGNOSIS — Z79899 Other long term (current) drug therapy: Secondary | ICD-10-CM | POA: Diagnosis not present

## 2017-03-20 DIAGNOSIS — Y939 Activity, unspecified: Secondary | ICD-10-CM | POA: Diagnosis not present

## 2017-03-20 DIAGNOSIS — S83429A Sprain of lateral collateral ligament of unspecified knee, initial encounter: Secondary | ICD-10-CM | POA: Insufficient documentation

## 2017-03-20 DIAGNOSIS — F17210 Nicotine dependence, cigarettes, uncomplicated: Secondary | ICD-10-CM | POA: Diagnosis not present

## 2017-03-20 DIAGNOSIS — S7012XA Contusion of left thigh, initial encounter: Secondary | ICD-10-CM | POA: Diagnosis not present

## 2017-03-20 DIAGNOSIS — Y999 Unspecified external cause status: Secondary | ICD-10-CM | POA: Insufficient documentation

## 2017-03-20 DIAGNOSIS — S8002XA Contusion of left knee, initial encounter: Secondary | ICD-10-CM | POA: Insufficient documentation

## 2017-03-20 DIAGNOSIS — S8000XA Contusion of unspecified knee, initial encounter: Secondary | ICD-10-CM

## 2017-03-20 DIAGNOSIS — Y929 Unspecified place or not applicable: Secondary | ICD-10-CM | POA: Diagnosis not present

## 2017-03-20 DIAGNOSIS — S8992XA Unspecified injury of left lower leg, initial encounter: Secondary | ICD-10-CM | POA: Diagnosis present

## 2017-03-20 LAB — CBC WITH DIFFERENTIAL/PLATELET
BASOS ABS: 0 10*3/uL (ref 0.0–0.1)
BASOS PCT: 0 %
Eosinophils Absolute: 0.2 10*3/uL (ref 0.0–0.7)
Eosinophils Relative: 3 %
HEMATOCRIT: 40.5 % (ref 36.0–46.0)
Hemoglobin: 14.1 g/dL (ref 12.0–15.0)
Lymphocytes Relative: 41 %
Lymphs Abs: 3.2 10*3/uL (ref 0.7–4.0)
MCH: 30.5 pg (ref 26.0–34.0)
MCHC: 34.8 g/dL (ref 30.0–36.0)
MCV: 87.5 fL (ref 78.0–100.0)
MONO ABS: 0.4 10*3/uL (ref 0.1–1.0)
MONOS PCT: 5 %
NEUTROS ABS: 3.9 10*3/uL (ref 1.7–7.7)
Neutrophils Relative %: 51 %
PLATELETS: 339 10*3/uL (ref 150–400)
RBC: 4.63 MIL/uL (ref 3.87–5.11)
RDW: 13.1 % (ref 11.5–15.5)
WBC: 7.8 10*3/uL (ref 4.0–10.5)

## 2017-03-20 LAB — COMPREHENSIVE METABOLIC PANEL
ALBUMIN: 3.7 g/dL (ref 3.5–5.0)
ALT: 24 U/L (ref 14–54)
ANION GAP: 9 (ref 5–15)
AST: 24 U/L (ref 15–41)
Alkaline Phosphatase: 83 U/L (ref 38–126)
BILIRUBIN TOTAL: 0.6 mg/dL (ref 0.3–1.2)
BUN: 11 mg/dL (ref 6–20)
CHLORIDE: 105 mmol/L (ref 101–111)
CO2: 21 mmol/L — ABNORMAL LOW (ref 22–32)
Calcium: 9.4 mg/dL (ref 8.9–10.3)
Creatinine, Ser: 0.81 mg/dL (ref 0.44–1.00)
GFR calc Af Amer: >60 mL/min (ref >60)
Glucose, Bld: 110 mg/dL — ABNORMAL HIGH (ref 65–99)
POTASSIUM: 3.9 mmol/L (ref 3.5–5.1)
Sodium: 135 mmol/L (ref 135–145)
TOTAL PROTEIN: 6.7 g/dL (ref 6.5–8.1)

## 2017-03-20 LAB — I-STAT BETA HCG BLOOD, ED (MC, WL, AP ONLY)

## 2017-03-20 MED ORDER — ONDANSETRON HCL 4 MG/2ML IJ SOLN
4.0000 mg | Freq: Once | INTRAMUSCULAR | Status: AC
  Administered 2017-03-20: 4 mg via INTRAVENOUS
  Filled 2017-03-20: qty 2

## 2017-03-20 MED ORDER — CYCLOBENZAPRINE HCL 5 MG PO TABS: 5.0000 mg | ORAL_TABLET | Freq: Three times a day (TID) | ORAL | 0 refills | Status: DC | PRN

## 2017-03-20 MED ORDER — HYDROMORPHONE HCL 4 MG PO TABS: 4.0000 mg | ORAL_TABLET | Freq: Four times a day (QID) | ORAL | 0 refills | Status: DC | PRN

## 2017-03-20 MED ORDER — SODIUM CHLORIDE 0.9 % IV BOLUS (SEPSIS): 1000.0000 mL | Freq: Once | INTRAVENOUS | Status: DC

## 2017-03-20 MED ORDER — HYDROMORPHONE HCL 1 MG/ML IJ SOLN
1.0000 mg | Freq: Once | INTRAMUSCULAR | Status: AC
  Administered 2017-03-20: 1 mg via INTRAVENOUS
  Filled 2017-03-20: qty 1

## 2017-03-20 MED ORDER — IBUPROFEN 800 MG PO TABS: 800.0000 mg | ORAL_TABLET | Freq: Three times a day (TID) | ORAL | 0 refills | Status: DC

## 2017-03-20 NOTE — Discharge Instructions (Signed)
Take motrin for pain.   Take flexeril for muscle spasms.   Take dilaudid for severe pain.   Use your crutches at home.   Rest for 2 days.   See your doctor for follow up   Return to ER if you have worse leg pain, unable to walk, weakness, chest pain, abdominal pain, headaches.
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
## 2017-03-20 NOTE — ED Notes (Signed)
ED Provider at bedside. 

## 2017-03-20 NOTE — ED Notes (Signed)
Patient transported to X-ray 
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
## 2017-03-21 NOTE — Telephone Encounter (Signed)
FMLA completed and turned in to box.

## 2017-03-25 NOTE — Telephone Encounter (Signed)
Paperwork scanned and faxed on 03/25/17

## 2017-04-24 ENCOUNTER — Other Ambulatory Visit: Payer: Self-pay

## 2017-04-24 ENCOUNTER — Ambulatory Visit (INDEPENDENT_AMBULATORY_CARE_PROVIDER_SITE_OTHER): Payer: BLUE CROSS/BLUE SHIELD

## 2017-04-24 ENCOUNTER — Encounter: Payer: Self-pay | Admitting: Family Medicine

## 2017-04-24 ENCOUNTER — Ambulatory Visit: Payer: BLUE CROSS/BLUE SHIELD | Admitting: Family Medicine

## 2017-04-24 VITALS — BP 126/80 | HR 88 | Temp 98.2°F | Resp 16 | Ht 64.0 in | Wt 163.8 lb

## 2017-04-24 DIAGNOSIS — S99921A Unspecified injury of right foot, initial encounter: Secondary | ICD-10-CM

## 2017-04-24 DIAGNOSIS — S99911A Unspecified injury of right ankle, initial encounter: Secondary | ICD-10-CM | POA: Diagnosis not present

## 2017-04-24 DIAGNOSIS — M25561 Pain in right knee: Secondary | ICD-10-CM

## 2017-04-24 DIAGNOSIS — S8991XA Unspecified injury of right lower leg, initial encounter: Secondary | ICD-10-CM

## 2017-04-24 DIAGNOSIS — M79661 Pain in right lower leg: Secondary | ICD-10-CM | POA: Diagnosis not present

## 2017-04-24 DIAGNOSIS — S8001XA Contusion of right knee, initial encounter: Secondary | ICD-10-CM | POA: Diagnosis not present

## 2017-04-24 MED ORDER — CELECOXIB 100 MG PO CAPS: 100.0000 mg | ORAL_CAPSULE | Freq: Two times a day (BID) | ORAL | 1 refills | Status: DC

## 2017-04-24 NOTE — Patient Instructions (Addendum)
Stat a compression sleeve. Ice 4 times a day for 20 minutes.   IF you received an x-ray today, you will receive an invoice from [facility]. Please contact [facility] at [phone] with questions or concerns regarding your invoice.   IF you received labwork today, you will receive an invoice from [facility]. Please contact [facility] at [phone] with questions or concerns regarding your invoice.   Our billing staff will not be able to assist you with questions regarding bills from these companies.  You will be contacted with the lab results as soon as they are available. The fastest way to get your results is to activate your My Chart account. Instructions are located on the last page of this paperwork. If you have not heard from [place] regarding the results in 2 weeks, please contact this office.      Pes Anserine Bursitis The pes anserine is an area on the inside of your knee, just below the joint, which is cushioned by a fluid-filled sac (bursa). Pes anserine bursitis is a condition that happens when this bursa gets swollen and irritated. The condition causes knee pain. What are the causes? This condition may be caused by:  Making the same movement over and over.  A hit to the inside of the leg.  What increases the risk? This injury is most likely to develop in:  Runners.  Athletes who play sports that involve a lot of running and quick side-to-side movements (cutting).  Athletes who play contact sports.  People who swim using an inward angle of the knee, such as with the breaststroke.  People with tight hamstring muscles.  Females.  People who are overweight.  People with flat feet.  People who have diabetes or osteoarthritis.  What are the signs or symptoms? Symptoms of this condition include:  Knee pain that gets better with rest and worse with activities like climbing stairs, walking, running, or getting in and out of a chair  (common).  Swelling.  Warmth.  Tenderness when pressing at the inside of the lower leg, just below the knee.  How is this diagnosed? This condition may be diagnosed based on:  Your symptoms.  Your medical history.  A physical exam.  Tests, such as: ? X-rays. ? MRI and ultrasound. These tests are done to check for swelling and fluid buildup in the bursa and to look at muscles and tendons.  During your physical exam, your health care provider will press on the tendon attachment to see if you feel pain. He or she may also check your hip and knee motion and strength. How is this treated? This condition may be treated by:  Resting your knee.  Avoiding activities that cause pain.  Icing the inside of your knee.  Raising (elevating) your knee while resting.  Sleeping with a pillow between your knees. This will cushion your injured knee.  Taking medicine to reduce pain and swelling.  Having medicines injected into your knee.  Doing strengthening and stretching exercises (physical therapy).  If these treatments do not work or if the condition keeps coming back, you may need to have surgery to remove the bursa. Follow these instructions at home: Managing pain, stiffness, and swelling  If directed, apply ice to your knee. ? Put ice in a plastic bag. ? Place a towel between your skin and the bag. ? Leave the ice on for 20 minutes, 2-3 times a day.  While you are sitting, elevate your knee.  While you are lying  down, elevate your knee above the level of your heart.  Take over-the-counter and prescription medicines only as told by your health care provider. Activity  Return to your normal activities as told by your health care provider. Ask your health care provider what activities are safe for you.  Do exercises as told by your health care provider. General instructions  Sleep with a pillow between your knees.  Do not use any tobacco products, such as cigarettes,  chewing tobacco, and e-cigarettes. Tobacco can delay healing. If you need help quitting, ask your health care provider.  Keep all follow-up visits as told by your health care provider. This is important. How is this prevented?  Warm up and stretch before being active.  Cool down and stretch after being active.  Give your body time to rest between periods of activity.  Make sure to use equipment that fits you.  Be safe and responsible while being active to avoid falls.  Do at least 150 minutes of moderate-intensity exercise each week, such as brisk walking or water aerobics.  Maintain physical fitness, including: ? Strength. ? Flexibility. ? Cardiovascular fitness. ? Endurance. Contact a health care provider if:  Your symptoms do not improve.  Your symptoms get worse. This information is not intended to replace advice given to you by your health care provider. Make sure you discuss any questions you have with your health care provider. Document Released: 05/28/2005 Document Revised: 01/31/2016 Document Reviewed: 05/13/2015 Elsevier Interactive Patient Education  [employer].
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
## 2017-05-05 ENCOUNTER — Telehealth: Payer: Self-pay | Admitting: Family Medicine

## 2017-05-05 NOTE — Telephone Encounter (Signed)
Pt gave me diability forms that needed completion during her OV 04/24/17. Completed and put in FMLA/Disability box.  There are parts in section 1 and 2 that she will need to complete before we can fax it in. No charge to pt on these forms please. THanks.

## 2017-05-06 ENCOUNTER — Telehealth: Payer: Self-pay | Admitting: Family Medicine

## 2017-05-06 ENCOUNTER — Telehealth: Payer: Self-pay

## 2017-05-06 NOTE — Telephone Encounter (Signed)
Spoke with pt. Sent referral to [facility] for Dr. Rhona Raider and gave pt their phone number to call and schedule later today or tomorrow.

## 2017-05-06 NOTE — Telephone Encounter (Signed)
Copied from [place] [id number]. Topic: Referral - Question >> May 06, 2017  2:47 PM Conception Chancy, NT wrote: Reason for CRM: Cassie Freer is calling stating pt is calling them saying the office is supposed to be sending over a referral but [facility] has not received one. They are calling for clarification.   Fax : [phone]  Noelle for attention.

## 2017-05-06 NOTE — Telephone Encounter (Signed)
Copied from [place] [id number]. Topic: Quick Communication - See Telephone Encounter >> May 06, 2017  1:00 PM Boyd Kerbs wrote: CRM for notification. See Telephone encounter for:   Pt. Call [facility] and they did not have referral.   Please refax referral as pt. Is saying her leg is hurting bad (8 on a scale from 1-10) and wants to get in right away.   05/06/17.

## 2017-05-06 NOTE — Telephone Encounter (Signed)
Paperwork has been scanned in, also called patient to let her know they are ready for her.

## 2017-05-07 ENCOUNTER — Telehealth: Payer: Self-pay | Admitting: Family Medicine

## 2017-05-07 NOTE — Telephone Encounter (Signed)
Called [facility] and left a message for the front desk concerning pt referral we sent over on 11/26 we did receive a confirmation back that they received her referral.. Left a message for someone at the front desk to call me.Marland Kitchen

## 2017-05-09 NOTE — Telephone Encounter (Signed)
Pt has appt on 05/13/17 with Dr. Rhona Raider at [facility]

## 2017-05-15 ENCOUNTER — Ambulatory Visit
Admission: RE | Admit: 2017-05-15 | Discharge: 2017-05-15 | Disposition: A | Discharge status: Home / Self Care | Payer: BLUE CROSS/BLUE SHIELD | Source: Ambulatory Visit | Attending: Family Medicine | Admitting: Family Medicine

## 2017-05-15 DIAGNOSIS — S8001XA Contusion of right knee, initial encounter: Secondary | ICD-10-CM

## 2017-05-15 DIAGNOSIS — M25561 Pain in right knee: Secondary | ICD-10-CM

## 2017-05-15 DIAGNOSIS — S99921A Unspecified injury of right foot, initial encounter: Secondary | ICD-10-CM

## 2017-05-15 DIAGNOSIS — S99911A Unspecified injury of right ankle, initial encounter: Principal | ICD-10-CM

## 2017-05-15 DIAGNOSIS — S8991XA Unspecified injury of right lower leg, initial encounter: Secondary | ICD-10-CM

## 2017-06-11 DIAGNOSIS — I214 Non-ST elevation (NSTEMI) myocardial infarction: Secondary | ICD-10-CM

## 2017-06-11 HISTORY — DX: Non-ST elevation (NSTEMI) myocardial infarction: I21.4

## 2017-06-30 ENCOUNTER — Other Ambulatory Visit: Payer: Self-pay

## 2017-06-30 ENCOUNTER — Encounter (HOSPITAL_COMMUNITY): Payer: Self-pay

## 2017-06-30 ENCOUNTER — Emergency Department (HOSPITAL_COMMUNITY): Payer: Commercial Managed Care - PPO

## 2017-06-30 ENCOUNTER — Encounter (HOSPITAL_COMMUNITY)
Admission: EM | Disposition: A | Discharge status: Home / Self Care | Payer: Self-pay | Source: Home / Self Care | Attending: Cardiovascular Disease

## 2017-06-30 ENCOUNTER — Inpatient Hospital Stay (HOSPITAL_COMMUNITY)
Admission: EM | Admit: 2017-06-30 | Discharge: 2017-07-04 | DRG: 247 | Disposition: A | Discharge status: Home / Self Care | Payer: Commercial Managed Care - PPO | Attending: Cardiovascular Disease | Admitting: Cardiovascular Disease

## 2017-06-30 DIAGNOSIS — E785 Hyperlipidemia, unspecified: Secondary | ICD-10-CM | POA: Diagnosis not present

## 2017-06-30 DIAGNOSIS — I25119 Atherosclerotic heart disease of native coronary artery with unspecified angina pectoris: Secondary | ICD-10-CM | POA: Diagnosis not present

## 2017-06-30 DIAGNOSIS — I97630 Postprocedural hematoma of a circulatory system organ or structure following a cardiac catheterization: Secondary | ICD-10-CM | POA: Diagnosis not present

## 2017-06-30 DIAGNOSIS — Y838 Other surgical procedures as the cause of abnormal reaction of the patient, or of later complication, without mention of misadventure at the time of the procedure: Secondary | ICD-10-CM | POA: Diagnosis not present

## 2017-06-30 DIAGNOSIS — R791 Abnormal coagulation profile: Secondary | ICD-10-CM | POA: Diagnosis not present

## 2017-06-30 DIAGNOSIS — I97410 Intraoperative hemorrhage and hematoma of a circulatory system organ or structure complicating a cardiac catheterization: Secondary | ICD-10-CM

## 2017-06-30 DIAGNOSIS — R918 Other nonspecific abnormal finding of lung field: Secondary | ICD-10-CM | POA: Diagnosis not present

## 2017-06-30 DIAGNOSIS — Z955 Presence of coronary angioplasty implant and graft: Secondary | ICD-10-CM

## 2017-06-30 DIAGNOSIS — I249 Acute ischemic heart disease, unspecified: Secondary | ICD-10-CM | POA: Diagnosis present

## 2017-06-30 DIAGNOSIS — Z8249 Family history of ischemic heart disease and other diseases of the circulatory system: Secondary | ICD-10-CM | POA: Diagnosis not present

## 2017-06-30 DIAGNOSIS — Z833 Family history of diabetes mellitus: Secondary | ICD-10-CM | POA: Diagnosis not present

## 2017-06-30 DIAGNOSIS — I200 Unstable angina: Secondary | ICD-10-CM

## 2017-06-30 DIAGNOSIS — R079 Chest pain, unspecified: Secondary | ICD-10-CM | POA: Diagnosis not present

## 2017-06-30 DIAGNOSIS — R0602 Shortness of breath: Secondary | ICD-10-CM | POA: Diagnosis not present

## 2017-06-30 DIAGNOSIS — I214 Non-ST elevation (NSTEMI) myocardial infarction: Secondary | ICD-10-CM | POA: Diagnosis not present

## 2017-06-30 DIAGNOSIS — R9431 Abnormal electrocardiogram [ECG] [EKG]: Secondary | ICD-10-CM | POA: Diagnosis not present

## 2017-06-30 DIAGNOSIS — F17210 Nicotine dependence, cigarettes, uncomplicated: Secondary | ICD-10-CM | POA: Diagnosis not present

## 2017-06-30 DIAGNOSIS — Z885 Allergy status to narcotic agent status: Secondary | ICD-10-CM

## 2017-06-30 DIAGNOSIS — Z9861 Coronary angioplasty status: Secondary | ICD-10-CM

## 2017-06-30 DIAGNOSIS — I237 Postinfarction angina: Secondary | ICD-10-CM | POA: Diagnosis present

## 2017-06-30 DIAGNOSIS — I2129 ST elevation (STEMI) myocardial infarction involving other sites: Principal | ICD-10-CM | POA: Diagnosis present

## 2017-06-30 DIAGNOSIS — Z720 Tobacco use: Secondary | ICD-10-CM | POA: Diagnosis not present

## 2017-06-30 HISTORY — DX: Benign neoplasm of connective and other soft tissue, unspecified: D21.9

## 2017-06-30 HISTORY — PX: CORONARY/GRAFT ACUTE MI REVASCULARIZATION: CATH118305

## 2017-06-30 HISTORY — DX: Sarcoidosis, unspecified: D86.9

## 2017-06-30 HISTORY — DX: Non-ST elevation (NSTEMI) myocardial infarction: I21.4

## 2017-06-30 HISTORY — PX: LEFT HEART CATH AND CORONARY ANGIOGRAPHY: CATH118249

## 2017-06-30 LAB — CBC
HEMATOCRIT: 44 % (ref 36.0–46.0)
HEMOGLOBIN: 15.5 g/dL — AB (ref 12.0–15.0)
MCH: 30.8 pg (ref 26.0–34.0)
MCHC: 35.2 g/dL (ref 30.0–36.0)
MCV: 87.5 fL (ref 78.0–100.0)
Platelets: 309 10*3/uL (ref 150–400)
RBC: 5.03 MIL/uL (ref 3.87–5.11)
RDW: 12.4 % (ref 11.5–15.5)
WBC: 9.2 10*3/uL (ref 4.0–10.5)

## 2017-06-30 LAB — BASIC METABOLIC PANEL
ANION GAP: 12 (ref 5–15)
BUN: 6 mg/dL (ref 6–20)
CO2: 23 mmol/L (ref 22–32)
Calcium: 11.1 mg/dL — ABNORMAL HIGH (ref 8.9–10.3)
Chloride: 104 mmol/L (ref 101–111)
Creatinine, Ser: 0.79 mg/dL (ref 0.44–1.00)
Glucose, Bld: 103 mg/dL — ABNORMAL HIGH (ref 65–99)
POTASSIUM: 4.1 mmol/L (ref 3.5–5.1)
Sodium: 139 mmol/L (ref 135–145)

## 2017-06-30 LAB — I-STAT TROPONIN, ED: TROPONIN I, POC: 3.56 ng/mL — AB (ref 0.00–0.08)

## 2017-06-30 LAB — HEMOGLOBIN A1C
Hgb A1c MFr Bld: 5.7 % — ABNORMAL HIGH (ref 4.8–5.6)
Mean Plasma Glucose: 116.89 mg/dL

## 2017-06-30 LAB — TSH: TSH: 1.073 u[IU]/mL (ref 0.350–4.500)

## 2017-06-30 LAB — D-DIMER, QUANTITATIVE (NOT AT ARMC): D DIMER QUANT: 0.61 ug{FEU}/mL — AB (ref 0.00–0.50)

## 2017-06-30 SURGERY — CORONARY/GRAFT ACUTE MI REVASCULARIZATION
Anesthesia: LOCAL

## 2017-06-30 MED ORDER — FENTANYL CITRATE (PF) 100 MCG/2ML IJ SOLN
50.0000 ug | INTRAMUSCULAR | Status: DC | PRN
Start: 2017-06-30 — End: 2017-07-04
  Administered 2017-06-30: 50 ug via INTRAVENOUS
  Filled 2017-06-30: qty 2

## 2017-06-30 MED ORDER — NITROGLYCERIN 0.4 MG SL SUBL
0.4000 mg | SUBLINGUAL_TABLET | SUBLINGUAL | Status: DC | PRN
Start: 2017-06-30 — End: 2017-06-30
  Administered 2017-06-30: 0.4 mg via SUBLINGUAL
  Filled 2017-06-30: qty 1

## 2017-06-30 MED ORDER — ACETAMINOPHEN 500 MG PO TABS
1000.0000 mg | ORAL_TABLET | Freq: Once | ORAL | Status: DC
  Filled 2017-06-30: qty 2

## 2017-06-30 MED ORDER — ATORVASTATIN CALCIUM 80 MG PO TABS: 80.0000 mg | ORAL_TABLET | Freq: Every day | ORAL | Status: DC

## 2017-06-30 MED ORDER — ASPIRIN 300 MG RE SUPP: 300.0000 mg | RECTAL | Status: AC

## 2017-06-30 MED ORDER — ASPIRIN 81 MG PO CHEW
324.0000 mg | CHEWABLE_TABLET | ORAL | Status: AC
  Administered 2017-06-30: 324 mg via ORAL

## 2017-06-30 MED ORDER — NITROGLYCERIN 0.4 MG SL SUBL: 0.4000 mg | SUBLINGUAL_TABLET | SUBLINGUAL | Status: DC | PRN

## 2017-06-30 MED ORDER — ASPIRIN 81 MG PO CHEW
324.0000 mg | CHEWABLE_TABLET | Freq: Once | ORAL | Status: AC
  Administered 2017-06-30: 324 mg via ORAL
  Filled 2017-06-30: qty 4

## 2017-06-30 MED ORDER — NITROGLYCERIN IN D5W 200-5 MCG/ML-% IV SOLN
0.0000 ug/min | INTRAVENOUS | Status: DC
  Administered 2017-06-30: 5 ug/min via INTRAVENOUS
  Filled 2017-06-30: qty 250

## 2017-06-30 MED ORDER — TIROFIBAN (AGGRASTAT) BOLUS VIA INFUSION
25.0000 ug/kg | Freq: Once | INTRAVENOUS | Status: AC
  Administered 2017-06-30: 1837.5 ug via INTRAVENOUS
  Filled 2017-06-30: qty 37

## 2017-06-30 MED ORDER — MORPHINE SULFATE (PF) 4 MG/ML IV SOLN
1.0000 mg | INTRAVENOUS | Status: DC | PRN
Start: 2017-06-30 — End: 2017-07-04
  Administered 2017-06-30 – 2017-07-03 (×2): 1 mg via INTRAVENOUS
  Filled 2017-06-30 (×2): qty 1

## 2017-06-30 MED ORDER — NITROGLYCERIN IN D5W 200-5 MCG/ML-% IV SOLN: 30.0000 ug/min | Freq: Once | INTRAVENOUS | Status: DC

## 2017-06-30 MED ORDER — HEPARIN BOLUS VIA INFUSION
3500.0000 [IU] | Freq: Once | INTRAVENOUS | Status: AC
  Administered 2017-06-30: 3500 [IU] via INTRAVENOUS
  Filled 2017-06-30: qty 3500

## 2017-06-30 MED ORDER — IOPAMIDOL (ISOVUE-370) INJECTION 76%
INTRAVENOUS | Status: AC
  Administered 2017-06-30: 100 mL
  Filled 2017-06-30: qty 100

## 2017-06-30 MED ORDER — ONDANSETRON HCL 4 MG/2ML IJ SOLN
4.0000 mg | Freq: Four times a day (QID) | INTRAMUSCULAR | Status: DC | PRN
  Filled 2017-06-30: qty 2

## 2017-06-30 MED ORDER — ASPIRIN EC 81 MG PO TBEC: 81.0000 mg | DELAYED_RELEASE_TABLET | Freq: Every day | ORAL | Status: DC

## 2017-06-30 MED ORDER — METOPROLOL TARTRATE 12.5 MG HALF TABLET
12.5000 mg | ORAL_TABLET | Freq: Two times a day (BID) | ORAL | Status: DC
  Administered 2017-06-30 – 2017-07-04 (×8): 12.5 mg via ORAL
  Filled 2017-06-30 (×8): qty 1

## 2017-06-30 MED ORDER — HEPARIN (PORCINE) IN NACL 100-0.45 UNIT/ML-% IJ SOLN
950.0000 [IU]/h | INTRAMUSCULAR | Status: DC
  Administered 2017-06-30: 950 [IU]/h via INTRAVENOUS
  Filled 2017-06-30: qty 250

## 2017-06-30 MED ORDER — TIROFIBAN HCL IN NACL 5-0.9 MG/100ML-% IV SOLN
0.1500 ug/kg/min | INTRAVENOUS | Status: DC
  Administered 2017-06-30: 0.15 ug/kg/min via INTRAVENOUS
  Filled 2017-06-30 (×2): qty 100

## 2017-06-30 MED ORDER — ONDANSETRON HCL 4 MG/2ML IJ SOLN
4.0000 mg | Freq: Once | INTRAMUSCULAR | Status: AC
Start: 2017-06-30 — End: 2017-06-30
  Administered 2017-06-30: 4 mg via INTRAVENOUS
  Filled 2017-06-30: qty 2

## 2017-06-30 MED ORDER — ACETAMINOPHEN 325 MG PO TABS
650.0000 mg | ORAL_TABLET | ORAL | Status: DC | PRN
  Administered 2017-06-30: 650 mg via ORAL
  Filled 2017-06-30: qty 2

## 2017-06-30 SURGICAL SUPPLY — 22 items
BALLN SAPPHIRE 2.0X12 (BALLOONS) ×2
BALLOON SAPPHIRE 2.0X12 (BALLOONS) IMPLANT
CATH INFINITI 5FR ANG PIGTAIL (CATHETERS) ×1 IMPLANT
CATH OPTITORQUE TIG 4.0 5F (CATHETERS) ×1 IMPLANT
CATH VISTA GUIDE 6FR XB3 (CATHETERS) ×2 IMPLANT
CATH VISTA GUIDE 6FR XB3.5 (CATHETERS) ×1 IMPLANT
DEVICE CONTINUOUS FLUSH (MISCELLANEOUS) ×1 IMPLANT
DEVICE RAD COMP TR BAND LRG (VASCULAR PRODUCTS) ×1 IMPLANT
ELECT DEFIB PAD ADLT CADENCE (PAD) ×1 IMPLANT
GLIDESHEATH SLEND A-KIT 6F 22G (SHEATH) ×1 IMPLANT
GUIDEWIRE INQWIRE 1.5J.035X260 (WIRE) IMPLANT
INQWIRE 1.5J .035X260CM (WIRE) ×2
KIT ENCORE 26 ADVANTAGE (KITS) ×1 IMPLANT
KIT HEART LEFT (KITS) ×2 IMPLANT
PACK CARDIAC CATHETERIZATION (CUSTOM PROCEDURE TRAY) ×2 IMPLANT
SHEATH PINNACLE 6F 10CM (SHEATH) ×1 IMPLANT
STENT SYNERGY DES 3X16 (Permanent Stent) ×1 IMPLANT
SYR MEDRAD MARK V 150ML (SYRINGE) ×2 IMPLANT
TRANSDUCER W/STOPCOCK (MISCELLANEOUS) ×2 IMPLANT
TUBING CIL FLEX 10 FLL-RA (TUBING) ×2 IMPLANT
WIRE ASAHI PROWATER 180CM (WIRE) ×1 IMPLANT
WIRE HITORQ VERSACORE ST 145CM (WIRE) ×1 IMPLANT

## 2017-06-30 NOTE — ED Notes (Signed)
Pt c/o severe headache and some nausea.

## 2017-06-30 NOTE — ED Provider Notes (Addendum)
[facility] Provider Note   CSN: [id number] Arrival date & time: 06/30/17  [place]     History   Chief Complaint Chief Complaint  Patient presents with  . Chest Pain  . Shortness of Breath    HPI [name] is a 52 y.o. female.  Patient with history of cigarette smoking, anemia presents with anterior chest pain and mild left arm numbness with shortness of breath for the past 2 days. Gradually worsening. No specific exertional symptoms mild pleuritic component. No blood clot history. Parents both had heart attacks around age 80. Patient has had nausea as well. Patient does not have any known cardiac disease. Patient did have mild leg swelling from hitting her right leg couple months back and said she had an ultrasound which was negative for DVT.      Past Medical History:  Diagnosis Date  . Anemia   . Fibroid   . Sarcoidosis     Patient Active Problem List   Diagnosis Date Noted  . Rectal bleeding 10/18/2016  . Special screening for malignant neoplasms, colon 10/18/2016  . Vitamin D deficiency 04/16/2015  . Low HDL (under 40) 04/16/2015    Past Surgical History:  Procedure Laterality Date  . ABDOMINAL HYSTERECTOMY    . CESAREAN SECTION      OB History    No data available       Home Medications    Prior to Admission medications   Medication Sig Start Date End Date Taking? Authorizing Provider  celecoxib (CELEBREX) 100 MG capsule Take 1 capsule (100 mg total) 2 (two) times daily by mouth. 04/24/17   Shawnee Knapp, MD  mupirocin nasal ointment (BACTROBAN) 2 % Apply in each nostril daily Patient not taking: Reported on 03/20/2017 12/07/16   Charlann Lange, PA-C    Family History Family History  Problem Relation Age of Onset  . Hypertension Other   . Diabetes Other     Social History Social History   Tobacco Use  . Smoking status: Current Every Day Smoker    Packs/day: 1.00    Years: 20.00    Pack years:  20.00    Types: Cigarettes  . Smokeless tobacco: Never Used  Substance Use Topics  . Alcohol use: No  . Drug use: No     Allergies   Hydrocodone   Review of Systems Review of Systems  Constitutional: Positive for appetite change. Negative for chills and fever.  HENT: Negative for congestion.   Eyes: Negative for visual disturbance.  Respiratory: Positive for shortness of breath.   Cardiovascular: Positive for chest pain.  Gastrointestinal: Negative for abdominal pain and vomiting.  Genitourinary: Negative for dysuria and flank pain.  Musculoskeletal: Negative for back pain, neck pain and neck stiffness.  Skin: Negative for rash.  Neurological: Negative for light-headedness and headaches.     Physical Exam Updated Vital Signs BP (!) 143/93   Pulse 81   Temp (!) 97.5 F (36.4 C) (Oral)   Resp 19   Ht 5' 4.5" (1.638 m)   Wt 73.5 kg (162 lb)   SpO2 99%   BMI 27.38 kg/m   Physical Exam  Constitutional: She is oriented to person, place, and time. She appears well-developed and well-nourished.  HENT:  Head: Normocephalic and atraumatic.  Eyes: Conjunctivae are normal. Right eye exhibits no discharge. Left eye exhibits no discharge.  Neck: Normal range of motion. Neck supple. No tracheal deviation present.  Cardiovascular: Normal rate and regular rhythm.  Pulmonary/Chest: Effort normal and breath sounds normal.  Abdominal: Soft. She exhibits no distension. There is no tenderness. There is no guarding.  Musculoskeletal: She exhibits no edema.       Right lower leg: She exhibits no edema.       Left lower leg: She exhibits no edema.  Neurological: She is alert and oriented to person, place, and time.  Skin: Skin is warm. No rash noted.  Psychiatric: She has a normal mood and affect.  Nursing note and vitals reviewed.    ED Treatments / Results  Labs (all labs ordered are listed, but only abnormal results are displayed) Labs Reviewed  BASIC METABOLIC PANEL -  Abnormal; Notable for the following components:      Result Value   Glucose, Bld 103 (*)    Calcium 11.1 (*)    All other components within normal limits  CBC - Abnormal; Notable for the following components:   Hemoglobin 15.5 (*)    All other components within normal limits  D-DIMER, QUANTITATIVE (NOT AT [facility]) - Abnormal; Notable for the following components:   D-Dimer, Quant 0.61 (*)    All other components within normal limits  I-STAT TROPONIN, ED - Abnormal; Notable for the following components:   Troponin i, poc 3.56 (*)    All other components within normal limits  HEPARIN LEVEL (UNFRACTIONATED)  CBC    EKG  EKG Interpretation  Date/Time:  Sunday June 30 2017 18:50:42 EST Ventricular Rate:  79 PR Interval:  152 QRS Duration: 85 QT Interval:  382 QTC Calculation: 460 R Axis:   42 Text Interpretation:  Sinus rhythm Prominent P waves, nondiagnostic Low voltage, precordial leads Abnormal T, consider ischemia, diffuse leads Minimal ST elevation, anterior leads Baseline wander in lead(s) V3 Confirmed by Elnora Morrison [phone]) on 06/30/2017 7:15:57 PM       Radiology Dg Chest 2 View  Result Date: 06/30/2017 CLINICAL DATA:  Chest pain for 2 days EXAM: CHEST  2 VIEW COMPARISON:  03/20/2017 FINDINGS: The heart size and mediastinal contours are within normal limits. Both lungs are clear. The visualized skeletal structures are unremarkable. IMPRESSION: No active cardiopulmonary disease. Electronically Signed   By: Donavan Foil M.D.   On: 06/30/2017 19:47    Procedures .Critical Care Performed by: Elnora Morrison, MD Authorized by: Elnora Morrison, MD   Critical care provider statement:    Critical care time (minutes):  75   Critical care start time:  06/30/2017 7:00 PM   Critical care end time:  06/30/2017 8:15 PM   Critical care time was exclusive of:  Separately billable procedures and treating other patients   Critical care was necessary to treat or prevent imminent or  life-threatening deterioration of the following conditions:  Cardiac failure   Critical care was time spent personally by me on the following activities:  Examination of patient, ordering and review of radiographic studies and ordering and review of laboratory studies   I assumed direction of critical care for this patient from another provider in my specialty: no      (including critical care time)  Medications Ordered in ED Medications  nitroGLYCERIN (NITROSTAT) SL tablet 0.4 mg (0.4 mg Sublingual Given 06/30/17 1959)  fentaNYL (SUBLIMAZE) injection 50 mcg (50 mcg Intravenous Given 06/30/17 2008)  ondansetron (ZOFRAN) injection 4 mg (not administered)  acetaminophen (TYLENOL) tablet 1,000 mg (not administered)  heparin bolus via infusion 3,500 Units (not administered)  heparin ADULT infusion 100 units/mL (25000 units/269mL sodium chloride 0.45%) (not administered)  nitroGLYCERIN 50 mg in dextrose 5 % 250 mL (0.2 mg/mL) infusion (not administered)  aspirin chewable tablet 324 mg (324 mg Oral Given 06/30/17 2000)     Initial Impression / Assessment and Plan / ED Course  I have reviewed the triage vital signs and the nursing notes.  Pertinent labs & imaging results that were available during my care of the patient were reviewed by me and considered in my medical decision making (see chart for details).    Patient presents with worsening chest pain and significant abnormal EKG with T-wave inversions inferior/lateral. ASA ordered.  Low risk pe, d dimer pending. Cardiology consulted. Troponin returned elevated concern for a NSTEMI. Patient still having pain. Nitro drip ordered. Cardiology to admit. The patients results and plan were reviewed and discussed.   Any x-rays performed were independently reviewed by myself.   Differential diagnosis were considered with the presenting HPI.  Medications  nitroGLYCERIN (NITROSTAT) SL tablet 0.4 mg (0.4 mg Sublingual Given 06/30/17 1959)  fentaNYL  (SUBLIMAZE) injection 50 mcg (50 mcg Intravenous Given 06/30/17 2008)  ondansetron (ZOFRAN) injection 4 mg (not administered)  acetaminophen (TYLENOL) tablet 1,000 mg (not administered)  heparin bolus via infusion 3,500 Units (not administered)  heparin ADULT infusion 100 units/mL (25000 units/229mL sodium chloride 0.45%) (not administered)  nitroGLYCERIN 50 mg in dextrose 5 % 250 mL (0.2 mg/mL) infusion (not administered)  aspirin chewable tablet 324 mg (324 mg Oral Given 06/30/17 2000)    Vitals:   06/30/17 1900 06/30/17 1915 06/30/17 1945 06/30/17 2000  BP: (!) 149/83 (!) 158/100 (!) 171/111 (!) 143/93  Pulse: 79 68 68 81  Resp: (!) 21 (!) 22 17 19   Temp:      TempSrc:      SpO2: 98% 98% 99% 99%  Weight:      Height:        Final diagnoses:  Abnormal EKG  Acute chest pain  NSTEMI (non-ST elevated myocardial infarction) ([place])    Admission/ observation were discussed with the admitting physician, patient and/or family and they are comfortable with the plan.    Final Clinical Impressions(s) / ED Diagnoses   Final diagnoses:  Abnormal EKG  Acute chest pain  NSTEMI (non-ST elevated myocardial infarction) [facility])    ED Discharge Orders    None       Elnora Morrison, MD 06/30/17 2054    Elnora Morrison, MD 06/30/17 2312

## 2017-06-30 NOTE — ED Notes (Signed)
MD in to see pt. Pt pain still a 8 of 10. Will go up again on Nitro. Will call md if pts pain is still high after nitro up and morphine.

## 2017-06-30 NOTE — ED Notes (Signed)
Patient transported to CT 

## 2017-06-30 NOTE — Progress Notes (Signed)
   06/30/17 2300  Clinical Encounter Type  Visited With Patient and family together  Visit Type Spiritual support  Referral From Nurse  Spiritual Encounters  Spiritual Needs Prayer  Stress Factors  Patient Stress Factors Health changes   Chaplain responded to page about an urgent cath. Visited with [name] and offered spiritual support and a prayer that the cath would give her answers and relief.

## 2017-06-30 NOTE — H&P (Signed)
History and Physical   Patient ID: [name], MRN: [id number], DOB: [birth year]   Date of Encounter: 06/30/2017, 9:09 PM  Primary Care Provider: Shawnee Knapp, MD Cardiologist: NA Electrophysiologist:  NA  Chief Complaint:  Chest pain  History of Present Illness: [name] is a 52 y.o. female w/ h/o sarcoidosis but no significant cardiac hx or comorbid cardiac risk factors other than tobacco abuse and +FHx presents w/ 2 days of constant CP. Pt says discomfort is midsternal, a discomfort or "burning, aching" pain that started Friday at rest. It has waxed and waned a little but has been pretty much constant since then. It will radiate down her left arm at times w/ arm numbness, and has associated SOB and nausea. The only thing that really makes it better is to go to sleep, but the pain returns whenever she wakes up. Exertion does make the pain worse. When I saw her in the ED the discomfort she rated as 6/10. SL NTG did improve the pain in the ED but did not eradicate it completely.  Past Medical History:  Diagnosis Date  . Anemia   . Fibroid   . Sarcoidosis     Past Surgical History:  Procedure Laterality Date  . ABDOMINAL HYSTERECTOMY    . CESAREAN SECTION       Prior to Admission medications   Medication Sig Start Date End Date Taking? Authorizing Provider  celecoxib (CELEBREX) 100 MG capsule Take 1 capsule (100 mg total) 2 (two) times daily by mouth. 04/24/17   Shawnee Knapp, MD  mupirocin nasal ointment (BACTROBAN) 2 % Apply in each nostril daily Patient not taking: Reported on 03/20/2017 12/07/16   Charlann Lange, PA-C     Allergies: Allergies  Allergen Reactions  . Hydrocodone Hives and Itching    Social History:  The patient  reports that she has been smoking cigarettes.  She has a 20.00 pack-year smoking history. she has never used smokeless tobacco. She reports that she does not drink alcohol or use drugs.   Family History:  The patient's family history  includes Diabetes in her other; Hypertension in her other.   ROS:  Please see the history of present illness.     All other systems reviewed and negative.   Vital Signs: Blood pressure 137/72, pulse 77, temperature (!) 97.5 F (36.4 C), temperature source Oral, resp. rate 20, height 5' 4.5" (1.638 m), weight 73.5 kg (162 lb), SpO2 99 %.  PHYSICAL EXAM: General:  Well nourished, well developed, in no mild distress due to chest discomfort HEENT: normal Lymph: no adenopathy Neck: no JVD Endocrine:  No thryomegaly Vascular: No carotid bruits; DP pulses 2+ bilaterally   Cardiac:  normal S1, S2; RRR; no murmur  Lungs:  clear to auscultation bilaterally, no wheezing, rhonchi or rales  Abd: soft, nontender, no hepatomegaly  Ext: no edema Musculoskeletal:  No deformities, BUE and BLE strength normal and equal Skin: warm and dry  Neuro:  CNs 2-12 intact, no focal abnormalities noted Psych:  Normal affect   EKG:  06-30-17 NSR with sinus arrhythmia, borderline ST elevation in lead III with TWI in the inferior and anterolateral leads, new compared to 2016.   Labs:   Lab Results  Component Value Date   WBC 9.2 06/30/2017   HGB 15.5 (H) 06/30/2017   HCT 44.0 06/30/2017   MCV 87.5 06/30/2017   PLT 309 06/30/2017   Recent Labs  Lab 06/30/17 1952  NA 139  K 4.1  CL 104  CO2 23  BUN 6  CREATININE 0.79  CALCIUM 11.1*  GLUCOSE 103*   No results for input(s): CKTOTAL, CKMB, TROPONINI in the last 72 hours. Troponin [facility] of Care Test) Recent Labs    06/30/17 2012  TROPIPOC 3.56*    Lab Results  Component Value Date   CHOL 172 04/07/2015   HDL 33 (L) 04/07/2015   LDLCALC 118 04/07/2015   TRIG 103 04/07/2015   Lab Results  Component Value Date   DDIMER 0.61 (H) 06/30/2017    Radiology/Studies:  Dg Chest 2 View  Result Date: 06/30/2017 CLINICAL DATA:  Chest pain for 2 days EXAM: CHEST  2 VIEW COMPARISON:  03/20/2017 FINDINGS: The heart size and mediastinal contours are  within normal limits. Both lungs are clear. The visualized skeletal structures are unremarkable. IMPRESSION: No active cardiopulmonary disease. Electronically Signed   By: Donavan Foil M.D.   On: 06/30/2017 19:47      ASSESSMENT AND PLAN:   1. ACS: trop 3.56. EKG has appearance of probable evolving inferolateral STEMI, but pt has had 2 days of constant pain and EKG changes are likely evolving.  Obviously she has not completed her infarct as she continues to have 6-8/10 pain. Will start heparin IV gtt, NTG IV gtt; consider adding IIb/IIIa. Will hold off on LHC for now given duration of sx and evolving nature of EKG changes. If pain is refractory, will plan for LHC sooner rather than later. Will start BB, statin. Will order TTE.  2. Tobacco abuse: smoking cessation counseling as inpt.  Signed,  Rudean Curt, MD  06/30/2017 9:09 PM

## 2017-06-30 NOTE — Progress Notes (Signed)
ANTICOAGULATION CONSULT NOTE - Initial Consult  Pharmacy Consult for heparin Indication: chest pain/ACS  Allergies  Allergen Reactions  . Hydrocodone Hives and Itching    Patient Measurements: Height: 5' 4.5" (163.8 cm) Weight: 162 lb (73.5 kg) IBW/kg (Calculated) : 55.85 Heparin Dosing Weight: 70.9kg  Vital Signs: Temp: 97.5 F (36.4 C) (01/20 1827) Temp Source: Oral (01/20 1827) BP: 143/93 (01/20 2000) Pulse Rate: 81 (01/20 2000)  Labs: Recent Labs    06/30/17 1952  HGB 15.5*  HCT 44.0  PLT 309  CREATININE 0.79    Estimated Creatinine Clearance: 82.6 mL/min (by C-G formula based on SCr of 0.79 mg/dL).  Assessment: 39 YOF here with CP x2 days, to start heparin. Not on anticoagulation PTA. Baseline hgb and plts wnl, no bleeding noted.  Goal of Therapy:  Heparin level 0.3-0.7 units/ml Monitor platelets by anticoagulation protocol: Yes   Plan:   Heparin bolus 3500 units IV x1, then start infusion at 950 units/hr Heparin level in 6h Daily heparin level and cbc Follow cardiology plans  [name], PharmD, BCPS Clinical Pharmacist [phone] 06/30/2017 8:39 PM

## 2017-06-30 NOTE — ED Triage Notes (Signed)
Pt arrives POV with c/o chest pain x 2 days. States shortness of breath and left arm numbness  Today. C/o nausea and "feels hot"

## 2017-07-01 ENCOUNTER — Inpatient Hospital Stay (HOSPITAL_COMMUNITY): Payer: Commercial Managed Care - PPO

## 2017-07-01 ENCOUNTER — Encounter (HOSPITAL_COMMUNITY): Payer: Self-pay | Admitting: Cardiovascular Disease

## 2017-07-01 DIAGNOSIS — I214 Non-ST elevation (NSTEMI) myocardial infarction: Secondary | ICD-10-CM

## 2017-07-01 DIAGNOSIS — I361 Nonrheumatic tricuspid (valve) insufficiency: Secondary | ICD-10-CM

## 2017-07-01 DIAGNOSIS — I2129 ST elevation (STEMI) myocardial infarction involving other sites: Secondary | ICD-10-CM | POA: Diagnosis present

## 2017-07-01 DIAGNOSIS — I249 Acute ischemic heart disease, unspecified: Secondary | ICD-10-CM

## 2017-07-01 LAB — LIPID PANEL
CHOLESTEROL: 136 mg/dL (ref 0–200)
HDL: 33 mg/dL — ABNORMAL LOW (ref >40)
LDL Cholesterol: 86 mg/dL (ref 0–99)
Total CHOL/HDL Ratio: 4.1 RATIO
Triglycerides: 87 mg/dL (ref <150)
VLDL: 17 mg/dL (ref 0–40)

## 2017-07-01 LAB — BASIC METABOLIC PANEL
ANION GAP: 11 (ref 5–15)
Anion gap: 11 (ref 5–15)
BUN: 9 mg/dL (ref 6–20)
BUN: 9 mg/dL (ref 6–20)
CALCIUM: 10.2 mg/dL (ref 8.9–10.3)
CHLORIDE: 106 mmol/L (ref 101–111)
CO2: 19 mmol/L — ABNORMAL LOW (ref 22–32)
CO2: 20 mmol/L — ABNORMAL LOW (ref 22–32)
CREATININE: 0.85 mg/dL (ref 0.44–1.00)
CREATININE: 0.96 mg/dL (ref 0.44–1.00)
Calcium: 9.7 mg/dL (ref 8.9–10.3)
Chloride: 106 mmol/L (ref 101–111)
GFR calc non Af Amer: >60 mL/min (ref >60)
Glucose, Bld: 117 mg/dL — ABNORMAL HIGH (ref 65–99)
Glucose, Bld: 94 mg/dL (ref 65–99)
POTASSIUM: 4.1 mmol/L (ref 3.5–5.1)
Potassium: 4 mmol/L (ref 3.5–5.1)
SODIUM: 136 mmol/L (ref 135–145)
Sodium: 137 mmol/L (ref 135–145)

## 2017-07-01 LAB — CBC
HCT: 36.7 % (ref 36.0–46.0)
HEMATOCRIT: 35.5 % — AB (ref 36.0–46.0)
Hemoglobin: 12.2 g/dL (ref 12.0–15.0)
Hemoglobin: 12.7 g/dL (ref 12.0–15.0)
MCH: 30 pg (ref 26.0–34.0)
MCH: 30.5 pg (ref 26.0–34.0)
MCHC: 34.4 g/dL (ref 30.0–36.0)
MCHC: 34.6 g/dL (ref 30.0–36.0)
MCV: 87.2 fL (ref 78.0–100.0)
MCV: 88.2 fL (ref 78.0–100.0)
PLATELETS: 306 10*3/uL (ref 150–400)
Platelets: 287 10*3/uL (ref 150–400)
RBC: 4.07 MIL/uL (ref 3.87–5.11)
RBC: 4.16 MIL/uL (ref 3.87–5.11)
RDW: 12.4 % (ref 11.5–15.5)
RDW: 12.8 % (ref 11.5–15.5)
WBC: 7.8 10*3/uL (ref 4.0–10.5)
WBC: 10.5 10*3/uL (ref 4.0–10.5)

## 2017-07-01 LAB — ECHOCARDIOGRAM COMPLETE
HEIGHTINCHES: 64.5 in
Weight: 2592 oz

## 2017-07-01 LAB — POCT ACTIVATED CLOTTING TIME
ACTIVATED CLOTTING TIME: 478 s
ACTIVATED CLOTTING TIME: 98 s

## 2017-07-01 LAB — TROPONIN I
Troponin I: 4.63 ng/mL (ref <0.03)
Troponin I: 4.32 ng/mL (ref <0.03)

## 2017-07-01 LAB — MRSA PCR SCREENING: MRSA by PCR: NEGATIVE

## 2017-07-01 LAB — HIV ANTIBODY (ROUTINE TESTING W REFLEX): HIV Screen 4th Generation wRfx: NONREACTIVE

## 2017-07-01 MED ORDER — LABETALOL HCL 5 MG/ML IV SOLN: 10.0000 mg | INTRAVENOUS | Status: AC | PRN

## 2017-07-01 MED ORDER — SODIUM CHLORIDE 0.9 % IV SOLN
1.7500 mg/kg/h | INTRAVENOUS | Status: AC
  Administered 2017-07-01: 1.75 mg/kg/h via INTRAVENOUS
  Filled 2017-07-01: qty 250

## 2017-07-01 MED ORDER — TICAGRELOR 90 MG PO TABS
ORAL_TABLET | ORAL | Status: AC
  Filled 2017-07-01: qty 2

## 2017-07-01 MED ORDER — NITROGLYCERIN 1 MG/10 ML FOR IR/CATH LAB
INTRA_ARTERIAL | Status: AC
  Filled 2017-07-01: qty 10

## 2017-07-01 MED ORDER — BIVALIRUDIN TRIFLUOROACETATE 250 MG IV SOLR
INTRAVENOUS | Status: AC
  Filled 2017-07-01: qty 250

## 2017-07-01 MED ORDER — FENTANYL CITRATE (PF) 100 MCG/2ML IJ SOLN
INTRAMUSCULAR | Status: AC
  Filled 2017-07-01: qty 2

## 2017-07-01 MED ORDER — SODIUM CHLORIDE 0.9 % IV SOLN
INTRAVENOUS | Status: AC | PRN
  Administered 2017-07-01 (×2): 1.75 mg/kg/h via INTRAVENOUS

## 2017-07-01 MED ORDER — IOPAMIDOL (ISOVUE-370) INJECTION 76%
INTRAVENOUS | Status: DC | PRN
  Administered 2017-07-01: 135 mL via INTRA_ARTERIAL

## 2017-07-01 MED ORDER — SODIUM CHLORIDE 0.9% FLUSH: 3.0000 mL | INTRAVENOUS | Status: DC | PRN

## 2017-07-01 MED ORDER — HYDRALAZINE HCL 20 MG/ML IJ SOLN: 5.0000 mg | INTRAMUSCULAR | Status: AC | PRN

## 2017-07-01 MED ORDER — ACETAMINOPHEN 325 MG PO TABS
650.0000 mg | ORAL_TABLET | ORAL | Status: DC | PRN
  Administered 2017-07-03: 650 mg via ORAL
  Filled 2017-07-01: qty 2

## 2017-07-01 MED ORDER — ATORVASTATIN CALCIUM 80 MG PO TABS
80.0000 mg | ORAL_TABLET | Freq: Every day | ORAL | Status: DC
  Administered 2017-07-01 – 2017-07-03 (×3): 80 mg via ORAL
  Filled 2017-07-01 (×2): qty 1

## 2017-07-01 MED ORDER — HEPARIN (PORCINE) IN NACL 2-0.9 UNIT/ML-% IJ SOLN
INTRAMUSCULAR | Status: AC
  Filled 2017-07-01: qty 1000

## 2017-07-01 MED ORDER — IOPAMIDOL (ISOVUE-370) INJECTION 76%
INTRAVENOUS | Status: AC
  Filled 2017-07-01: qty 100

## 2017-07-01 MED ORDER — ATROPINE SULFATE 1 MG/10ML IJ SOSY
PREFILLED_SYRINGE | INTRAMUSCULAR | Status: AC
  Filled 2017-07-01: qty 10

## 2017-07-01 MED ORDER — LIDOCAINE HCL 1 % IJ SOLN
INTRAMUSCULAR | Status: AC
  Filled 2017-07-01: qty 20

## 2017-07-01 MED ORDER — NITROGLYCERIN 1 MG/10 ML FOR IR/CATH LAB
INTRA_ARTERIAL | Status: DC | PRN
  Administered 2017-07-01: 100 ug via INTRACORONARY

## 2017-07-01 MED ORDER — FENTANYL CITRATE (PF) 100 MCG/2ML IJ SOLN
INTRAMUSCULAR | Status: DC | PRN
  Administered 2017-07-01: 25 ug via INTRAVENOUS

## 2017-07-01 MED ORDER — ONDANSETRON HCL 4 MG/2ML IJ SOLN
4.0000 mg | Freq: Four times a day (QID) | INTRAMUSCULAR | Status: DC | PRN
  Administered 2017-07-01: 4 mg via INTRAVENOUS
  Filled 2017-07-01: qty 2

## 2017-07-01 MED ORDER — MIDAZOLAM HCL 2 MG/2ML IJ SOLN
INTRAMUSCULAR | Status: AC
  Filled 2017-07-01: qty 2

## 2017-07-01 MED ORDER — ASPIRIN 81 MG PO CHEW
81.0000 mg | CHEWABLE_TABLET | Freq: Every day | ORAL | Status: DC
  Administered 2017-07-01 – 2017-07-04 (×4): 81 mg via ORAL
  Filled 2017-07-01 (×4): qty 1

## 2017-07-01 MED ORDER — TICAGRELOR 90 MG PO TABS
90.0000 mg | ORAL_TABLET | Freq: Two times a day (BID) | ORAL | Status: DC
  Administered 2017-07-01 – 2017-07-04 (×7): 90 mg via ORAL
  Filled 2017-07-01 (×7): qty 1

## 2017-07-01 MED ORDER — LIDOCAINE HCL (PF) 1 % IJ SOLN
INTRAMUSCULAR | Status: DC | PRN
  Administered 2017-07-01: 2 mL
  Administered 2017-07-01: 30 mL

## 2017-07-01 MED ORDER — SODIUM CHLORIDE 0.9% FLUSH
3.0000 mL | Freq: Two times a day (BID) | INTRAVENOUS | Status: DC
  Administered 2017-07-01 – 2017-07-04 (×5): 3 mL via INTRAVENOUS

## 2017-07-01 MED ORDER — BIVALIRUDIN BOLUS VIA INFUSION - CUPID
INTRAVENOUS | Status: DC | PRN
  Administered 2017-07-01: 55.125 mg via INTRAVENOUS

## 2017-07-01 MED ORDER — SODIUM CHLORIDE 0.9 % IV SOLN: INTRAVENOUS | Status: AC

## 2017-07-01 MED ORDER — HEPARIN (PORCINE) IN NACL 2-0.9 UNIT/ML-% IJ SOLN
INTRAMUSCULAR | Status: AC | PRN
  Administered 2017-07-01: 1000 mL

## 2017-07-01 MED ORDER — TICAGRELOR 90 MG PO TABS
ORAL_TABLET | ORAL | Status: DC | PRN
Start: 2017-07-01 — End: 2017-07-01
  Administered 2017-07-01: 180 mg via ORAL

## 2017-07-01 MED ORDER — SODIUM CHLORIDE 0.9 % IV SOLN: 250.0000 mL | INTRAVENOUS | Status: DC | PRN

## 2017-07-01 MED ORDER — SODIUM CHLORIDE 0.9 % IV SOLN
INTRAVENOUS | Status: AC | PRN
  Administered 2017-07-01: 100 mL/h via INTRAVENOUS

## 2017-07-01 MED ORDER — VERAPAMIL HCL 2.5 MG/ML IV SOLN
INTRAVENOUS | Status: AC
  Filled 2017-07-01: qty 2

## 2017-07-01 MED ORDER — LIDOCAINE HCL (PF) 1 % IJ SOLN
INTRAMUSCULAR | Status: DC | PRN
  Administered 2017-07-01: 5 mL via INTRA_ARTERIAL
  Administered 2017-07-01: 10 mL via INTRA_ARTERIAL

## 2017-07-01 MED ORDER — MIDAZOLAM HCL 2 MG/2ML IJ SOLN
INTRAMUSCULAR | Status: DC | PRN
  Administered 2017-07-01: 1 mg via INTRAVENOUS

## 2017-07-01 NOTE — Progress Notes (Signed)
Around 0230 pt had nausea episode, HR and BP both dropped maps were in 40's (by art line) and HR was low 50's. Cardiology was called. Treated with zofran 4mg  and small fluid bolus (no more than 250 given since pressure returned). Most likely due to vagal response. Angiomax stopped at 0515 per order.  R radial site with TR band intact level 0.  R femoral site with sheath still in place, level 1 - little amount of bleeding and small hematoma felt. Will continue to monitor.

## 2017-07-01 NOTE — Interval H&P Note (Signed)
Cath Lab Visit (complete for each Cath Lab visit)  Clinical Evaluation Leading to the Procedure:   ACS: Yes.    Non-ACS:    Anginal Classification: CCS III  Anti-ischemic medical therapy: No Therapy  Non-Invasive Test Results: No non-invasive testing performed  Prior CABG: No previous CABG      History and Physical Interval Note:  07/01/2017 12:14 AM  [name]  has presented today for surgery, with the diagnosis of STEMI  The various methods of treatment have been discussed with the patient and family. After consideration of risks, benefits and other options for treatment, the patient has consented to  Procedure(s): Coronary/Graft Acute MI Revascularization (N/A) LEFT HEART CATH AND CORONARY ANGIOGRAPHY (N/A) as a surgical intervention .  The patient's history has been reviewed, patient examined, no change in status, stable for surgery.  I have reviewed the patient's chart and labs.  Questions were answered to the patient's satisfaction.     Quay Burow

## 2017-07-01 NOTE — ED Notes (Signed)
MD paged. Pt still c/o chest pain 6/10.

## 2017-07-01 NOTE — Progress Notes (Signed)
  Echocardiogram 2D Echocardiogram has been performed.  Halina Asano T Maija Biggers 07/01/2017, 10:22 AM

## 2017-07-01 NOTE — Progress Notes (Signed)
Progress Note  Patient Name: [name] Date of Encounter: 07/01/2017  Primary Cardiologist: Gwenlyn Found  Subjective   Admitted earlier today.  Presentation was that of ACS/inferolateral injury.  Cath demonstrated high-grade obstruction and circumflex and moderate obstruction in LAD.  Currently pain-free.  Lying flat due to sheath pull recently.  The patient is a smoker.   Inpatient Medications    Scheduled Meds: . acetaminophen  1,000 mg Oral Once  . aspirin  81 mg Oral Daily  . atorvastatin  80 mg Oral q1800  . atropine      . metoprolol tartrate  12.5 mg Oral BID  . sodium chloride flush  3 mL Intravenous Q12H  . ticagrelor  90 mg Oral BID   Continuous Infusions: . sodium chloride    . sodium chloride    . nitroGLYCERIN Stopped (07/01/17 0054)   PRN Meds: sodium chloride, acetaminophen, fentaNYL (SUBLIMAZE) injection, morphine injection, nitroGLYCERIN, ondansetron (ZOFRAN) IV, sodium chloride flush   Vital Signs    Vitals:   07/01/17 0645 07/01/17 0700 07/01/17 0715 07/01/17 0730  BP:  110/81    Pulse:      Resp: 14 17 18 11   Temp:      TempSrc:      SpO2: 100% 100% 99% 100%  Weight:      Height:        Intake/Output Summary (Last 24 hours) at 07/01/2017 0806 Last data filed at 07/01/2017 0700 Gross per 24 hour  Intake 495.54 ml  Output 400 ml  Net 95.54 ml   Filed Weights   06/30/17 1829  Weight: 162 lb (73.5 kg)    Telemetry    Sinus rhythm- Personally Reviewed  ECG    Performed on 07/01/2017 demonstrates inferolateral ST elevation with T wave inversion consistent with acute/subacute injury- Personally Reviewed  Physical Exam  Young appearing African-American female in no acute distress GEN: No acute distress.   Neck: No JVD Cardiac: RRR, no murmurs, rubs, or gallops.  Right groin hematoma and small but tender.  Having mild low back discomfort but states she has a chronic problem with back discomfort. Respiratory: Clear to auscultation  bilaterally. GI: Soft, nontender, non-distended  MS: No edema; No deformity. Neuro:  Nonfocal  Psych: Normal affect   Labs    Chemistry Recent Labs  Lab 06/30/17 1952 07/01/17 0320  NA 139 136  K 4.1 4.0  CL 104 106  CO2 23 19*  GLUCOSE 103* 117*  BUN 6 9  CREATININE 0.79 0.85  CALCIUM 11.1* 10.2  GFRNONAA >60 >60  GFRAA >60 >60  ANIONGAP 12 11     Hematology Recent Labs  Lab 06/30/17 1952 07/01/17 0320  WBC 9.2 7.8  RBC 5.03 4.07  HGB 15.5* 12.2  HCT 44.0 35.5*  MCV 87.5 87.2  MCH 30.8 30.0  MCHC 35.2 34.4  RDW 12.4 12.4  PLT 309 287    Cardiac Enzymes Recent Labs  Lab 07/01/17 0320  TROPONINI 4.63*    Recent Labs  Lab 06/30/17 2012  TROPIPOC 3.56*     BNPNo results for input(s): BNP, PROBNP in the last 168 hours.   DDimer  Recent Labs  Lab 06/30/17 1952  DDIMER 0.61*     Radiology    Dg Chest 2 View  Result Date: 06/30/2017 CLINICAL DATA:  Chest pain for 2 days EXAM: CHEST  2 VIEW COMPARISON:  03/20/2017 FINDINGS: The heart size and mediastinal contours are within normal limits. Both lungs are clear. The visualized skeletal  structures are unremarkable. IMPRESSION: No active cardiopulmonary disease. Electronically Signed   By: Donavan Foil M.D.   On: 07/22/2017 19:47   Ct Angio Chest Pe W And/or Wo Contrast  Result Date: 07-22-2017 CLINICAL DATA:  Chest pain for 2 days. Shortness of breath and left arm numbness. Nausea and headache today. History of sarcoidosis and anemia. EXAM: CT ANGIOGRAPHY CHEST WITH CONTRAST TECHNIQUE: Multidetector CT imaging of the chest was performed using the standard protocol during bolus administration of intravenous contrast. Multiplanar CT image reconstructions and MIPs were obtained to evaluate the vascular anatomy. CONTRAST:  177mL ISOVUE-370 IOPAMIDOL (ISOVUE-370) INJECTION 76% COMPARISON:  Chest radiograph 07-22-17 FINDINGS: Cardiovascular: Good opacification of the central and segmental pulmonary arteries.  No focal filling defects demonstrated. No evidence of significant pulmonary embolus. Normal heart size. Normal caliber thoracic aorta. Mediastinum/Nodes: No significant lymphadenopathy in the chest. A few scattered calcified lymph nodes are present. Esophagus is decompressed. Lungs/Pleura: Mild emphysematous changes mostly in the apex and periphery of the lungs. Mild atelectasis in the lung bases. Calcified granulomas in the right lung base. Dominant nodule in the left upper lung measuring 6 mm diameter. Smaller adjacent nodules are present. There a few scattered nodules in the lungs measuring less than 2 mm diameter. Irregular nodular opacities in the mid lungs along the peribronchial vascular region. Focal cavitation of a nodule in the right middle lung measuring 10 mm diameter. These lesions may be related to the history of sarcoidosis or may be inflammatory but without any comparison available, follow-up in 3 months is suggested. No focal consolidation or airspace disease. Upper Abdomen: Limited visualization of the upper abdominal organs demonstrates no obvious abnormality. Musculoskeletal: No chest wall abnormality. No acute or significant osseous findings. Review of the MIP images confirms the above findings. IMPRESSION: 1. No evidence of significant pulmonary embolus. 2. Multiple pulmonary nodules including a dominant nodule in the left upper lung measuring 6 mm and irregular mid lung nodules bilaterally. Right mid lung nodule measures 10 mm and contains cavitation. Non-contrast chest CT at 3-6 months is recommended. If the nodules are stable at time of repeat CT, then future CT at 18-24 months (from today's scan) is considered optional for low-risk patients, but is recommended for high-risk patients. This recommendation follows the consensus statement: Guidelines for Management of Incidental Pulmonary Nodules Detected on CT Images: From the Fleischner Society 2017; Radiology 2017; 284:228-243. 3. Mild  emphysematous changes in the lungs. Emphysema (ICD10-J43.9). Electronically Signed   By: Lucienne Capers M.D.   On: 07-22-17 21:29    Cardiac Studies  Cardiac catheterization Jul 22, 2017: Coronary Diagrams   Diagnostic Diagram       Post-Intervention Diagram        Conclusion      Prox LAD lesion is 70% stenosed.  3rd Mrg lesion is 100% stenosed.  Prox Cx to Mid Cx lesion is 90% stenosed.  A stent was successfully placed.  Post intervention, there is a 0% residual stenosis.  There is mild to moderate left ventricular systolic dysfunction.  LV end diastolic pressure is normal.  The left ventricular ejection fraction is 45-50% by visual estimate.     Patient Profile     52 y.o. female acute coronary syndrome involving the circumflex coronary artery with a several week history of intermittent episodes of chest discomfort.  Urgent catheterization demonstrated high-grade obstruction in the proximal to mid circumflex coronary artery and total occlusion of the distal third obtuse marginal.  Successful stent placed in the main body of the  circumflex.  No current chest pain.  Residual coronary disease, low normal LV function with EF 50% and normal pressures.  Assessment & Plan    1.  Acute coronary syndrome/inferolateral acute infarction treated with stent proximal to mid circumflex.  Continued occlusion of the distal obtuse marginal #3.  Plan is dual antiplatelet therapy, aggressive secondary risk factor modification. 2.  Tobacco abuse, discussed cessation. 3.  Screen for evidence of diabetes type 2 4.  Since she is a smoker, will need to be screened for evidence of PAD.  Plan is to be recumbent until able to ambulate post sheath pull.  Aggressive secondary risk factor modification.  Check lipid panel and hemoglobin A1c.  For questions or updates, please contact [place] Please consult [email or web address] for contact info under Cardiology/STEMI.      Signed, Sinclair Grooms, MD  07/01/2017, 8:06 AM

## 2017-07-02 ENCOUNTER — Inpatient Hospital Stay (HOSPITAL_COMMUNITY): Payer: Commercial Managed Care - PPO

## 2017-07-02 DIAGNOSIS — Z720 Tobacco use: Secondary | ICD-10-CM

## 2017-07-02 DIAGNOSIS — I97410 Intraoperative hemorrhage and hematoma of a circulatory system organ or structure complicating a cardiac catheterization: Secondary | ICD-10-CM

## 2017-07-02 DIAGNOSIS — M79609 Pain in unspecified limb: Secondary | ICD-10-CM

## 2017-07-02 DIAGNOSIS — E785 Hyperlipidemia, unspecified: Secondary | ICD-10-CM

## 2017-07-02 LAB — CBC
HEMATOCRIT: 32.7 % — AB (ref 36.0–46.0)
Hemoglobin: 11.1 g/dL — ABNORMAL LOW (ref 12.0–15.0)
MCH: 30.1 pg (ref 26.0–34.0)
MCHC: 33.9 g/dL (ref 30.0–36.0)
MCV: 88.6 fL (ref 78.0–100.0)
Platelets: 275 10*3/uL (ref 150–400)
RBC: 3.69 MIL/uL — ABNORMAL LOW (ref 3.87–5.11)
RDW: 12.8 % (ref 11.5–15.5)
WBC: 9 10*3/uL (ref 4.0–10.5)

## 2017-07-02 LAB — HEMOGLOBIN A1C
Hgb A1c MFr Bld: 5.8 % — ABNORMAL HIGH (ref 4.8–5.6)
Mean Plasma Glucose: 119.76 mg/dL

## 2017-07-02 NOTE — Progress Notes (Signed)
Right groin limited arterial duplex prelim: no evidence of pseudoaneurysm or hematoma. Landry Mellow, RDMS, RVT

## 2017-07-02 NOTE — Progress Notes (Signed)
Progress Note  Patient Name: [name] Date of Encounter: 07/02/2017  Primary Cardiologist: Andrews depressed.  Lying in bed with room dark.  Appears sad.  No chest discomfort.  Has ambulated with rehab.  No chest pain or dyspnea.  Still having discomfort in right groin.   Inpatient Medications    Scheduled Meds: . acetaminophen  1,000 mg Oral Once  . aspirin  81 mg Oral Daily  . atorvastatin  80 mg Oral q1800  . metoprolol tartrate  12.5 mg Oral BID  . sodium chloride flush  3 mL Intravenous Q12H  . ticagrelor  90 mg Oral BID   Continuous Infusions: . sodium chloride    . nitroGLYCERIN Stopped (07/01/17 0054)   PRN Meds: sodium chloride, acetaminophen, fentaNYL (SUBLIMAZE) injection, morphine injection, nitroGLYCERIN, ondansetron (ZOFRAN) IV, sodium chloride flush   Vital Signs    Vitals:   07/02/17 0600 07/02/17 0800 07/02/17 0846 07/02/17 1122  BP: 107/65 105/71  102/67  Pulse:      Resp: 19 19  (!) 21  Temp:   97.6 F (36.4 C)   TempSrc:   Oral   SpO2: 100% 100%  100%  Weight:      Height:        Intake/Output Summary (Last 24 hours) at 07/02/2017 1153 Last data filed at 07/02/2017 0800 Gross per 24 hour  Intake 1100 ml  Output -  Net 1100 ml   Filed Weights   06/30/17 1829  Weight: 162 lb (73.5 kg)    Telemetry    Sinus rhythm- Personally Reviewed  ECG    Performed on 07/02/2017 demonstrates mild inferior T wave inversion.  Repolarization abnormality is improved when compared to the most recent tracing on 07/01/2017.- Personally Reviewed  Physical Exam  Appears depressed. GEN: No acute distress.   Neck: No JVD.  No carotid bruit is heard. Cardiac:  No gallop, rub, click, or murmur.  Right groin hematoma is relatively small, still tender.  No bruit is heard. Respiratory: Clear to auscultation bilaterally. GI: Soft, nontender, non-distended  MS: No edema; No deformity. Neuro:  Nonfocal  Psych: Normal affect   Labs     Chemistry Recent Labs  Lab 06/30/17 1952 07/01/17 0320 07/01/17 1051  NA 139 136 137  K 4.1 4.0 4.1  CL 104 106 106  CO2 23 19* 20*  GLUCOSE 103* 117* 94  BUN 6 9 9   CREATININE 0.79 0.85 0.96  CALCIUM 11.1* 10.2 9.7  GFRNONAA >60 >60 >60  GFRAA >60 >60 >60  ANIONGAP 12 11 11      Hematology Recent Labs  Lab 07/01/17 0320 07/01/17 1051 07/02/17 0258  WBC 7.8 10.5 9.0  RBC 4.07 4.16 3.69*  HGB 12.2 12.7 11.1*  HCT 35.5* 36.7 32.7*  MCV 87.2 88.2 88.6  MCH 30.0 30.5 30.1  MCHC 34.4 34.6 33.9  RDW 12.4 12.8 12.8  PLT 287 306 275    Cardiac Enzymes Recent Labs  Lab 07/01/17 0320 07/01/17 1051  TROPONINI 4.63* 4.32*    Recent Labs  Lab 06/30/17 2012  TROPIPOC 3.56*     BNPNo results for input(s): BNP, PROBNP in the last 168 hours.   DDimer  Recent Labs  Lab 06/30/17 1952  DDIMER 0.61*     Radiology    Dg Chest 2 View  Result Date: 06/30/2017 CLINICAL DATA:  Chest pain for 2 days EXAM: CHEST  2 VIEW COMPARISON:  03/20/2017 FINDINGS: The heart size and mediastinal contours  are within normal limits. Both lungs are clear. The visualized skeletal structures are unremarkable. IMPRESSION: No active cardiopulmonary disease. Electronically Signed   By: Donavan Foil M.D.   On: Jul 02, 2017 19:47   Ct Angio Chest Pe W And/or Wo Contrast  Result Date: 2017/07/02 CLINICAL DATA:  Chest pain for 2 days. Shortness of breath and left arm numbness. Nausea and headache today. History of sarcoidosis and anemia. EXAM: CT ANGIOGRAPHY CHEST WITH CONTRAST TECHNIQUE: Multidetector CT imaging of the chest was performed using the standard protocol during bolus administration of intravenous contrast. Multiplanar CT image reconstructions and MIPs were obtained to evaluate the vascular anatomy. CONTRAST:  11mL ISOVUE-370 IOPAMIDOL (ISOVUE-370) INJECTION 76% COMPARISON:  Chest radiograph Jul 02, 2017 FINDINGS: Cardiovascular: Good opacification of the central and segmental pulmonary  arteries. No focal filling defects demonstrated. No evidence of significant pulmonary embolus. Normal heart size. Normal caliber thoracic aorta. Mediastinum/Nodes: No significant lymphadenopathy in the chest. A few scattered calcified lymph nodes are present. Esophagus is decompressed. Lungs/Pleura: Mild emphysematous changes mostly in the apex and periphery of the lungs. Mild atelectasis in the lung bases. Calcified granulomas in the right lung base. Dominant nodule in the left upper lung measuring 6 mm diameter. Smaller adjacent nodules are present. There a few scattered nodules in the lungs measuring less than 2 mm diameter. Irregular nodular opacities in the mid lungs along the peribronchial vascular region. Focal cavitation of a nodule in the right middle lung measuring 10 mm diameter. These lesions may be related to the history of sarcoidosis or may be inflammatory but without any comparison available, follow-up in 3 months is suggested. No focal consolidation or airspace disease. Upper Abdomen: Limited visualization of the upper abdominal organs demonstrates no obvious abnormality. Musculoskeletal: No chest wall abnormality. No acute or significant osseous findings. Review of the MIP images confirms the above findings. IMPRESSION: 1. No evidence of significant pulmonary embolus. 2. Multiple pulmonary nodules including a dominant nodule in the left upper lung measuring 6 mm and irregular mid lung nodules bilaterally. Right mid lung nodule measures 10 mm and contains cavitation. Non-contrast chest CT at 3-6 months is recommended. If the nodules are stable at time of repeat CT, then future CT at 18-24 months (from today's scan) is considered optional for low-risk patients, but is recommended for high-risk patients. This recommendation follows the consensus statement: Guidelines for Management of Incidental Pulmonary Nodules Detected on CT Images: From the Fleischner Society 2017; Radiology 2017; 284:228-243. 3.  Mild emphysematous changes in the lungs. Emphysema (ICD10-J43.9). Electronically Signed   By: Lucienne Capers M.D.   On: 07/02/2017 21:29    Cardiac Studies  Cardiac catheterization 07/02/17: Coronary Diagrams   Diagnostic Diagram       Post-Intervention Diagram        Conclusion      Prox LAD lesion is 70% stenosed.  3rd Mrg lesion is 100% stenosed.  Prox Cx to Mid Cx lesion is 90% stenosed.  A stent was successfully placed.  Post intervention, there is a 0% residual stenosis.  There is mild to moderate left ventricular systolic dysfunction.  LV end diastolic pressure is normal.  The left ventricular ejection fraction is 45-50% by visual estimate.     Patient Profile     52 y.o. female acute coronary syndrome who presented acutely with EKG changes.  Cath demonstrated high-grade circumflex disease which was stented.  Residual 50-70% mid LAD.  Normal LV EF with normal filling pressures.  Postprocedure groin hematoma.  Assessment & Plan  1. Anterolateral infarction due to high-grade circumflex disease treated with stenting.  No recurrence of angina.  Persistent abnormal ischemic appearing EKG.  Residual 50-70% mid LAD disease.  Guideline mandated care for secondary prevention has been started. 2. Right groin hematoma.  We will perform a duplex study today to rule out pseudoaneurysm. 3. Hyperlipidemia.  Target LDL less than 70.  Recorded LDL after admission was adequate.  HDL was low. 4. Tobacco abuse: Discussed in great detail the importance of smoking cessation.  We will plan to transfer.  Phase 1 cardiac rehab and encouraged the patient to participate in phase 2.  Duplex right femoral to exclude pseudoaneurysm.  Guideline determined medical therapy for secondary prevention: Smoking cessation, high intensity statin therapy, beta-blocker therapy, ACE inhibitor therapy if blood pressure allows, and dual antiplatelet therapy.  Potential discharge tomorrow if no  pseudoaneurysm and patient is doing relatively well.  For questions or updates, please contact [place] Please consult [email or web address] for contact info under Cardiology/STEMI.      Signed, Sinclair Grooms, MD  07/02/2017, 11:53 AM

## 2017-07-02 NOTE — Progress Notes (Signed)
CARDIAC REHAB PHASE I   PRE:  Rate/Rhythm: 75 SR  BP:  Supine: 115/74  Sitting:   Standing:    SaO2: 100%RA  MODE:  Ambulation: 370 ft   POST:  Rate/Rhythm: 85 SR  BP:  Supine:   Sitting: 102/67  Standing:    SaO2: 100%RA 1105-1210 Pt walked 370 ft on RA with steady gait. A little SOB by end of walk. Sats good on RA. MI education completed with pt who voiced understanding. Stressed importance of brilinta with stent. Needs to see case manager. Reviewed NTG use, MI restrictions, heart healthy diet and decreasing carbs, smoking cessation and ex ed. Gave fake cigarette and smoking cessation handout. Discussed CRP 2 and will refer to [facility]. To recliner with call bell. No CP with walk.   Graylon Good, RN BSN  07/02/2017 12:03 PM

## 2017-07-02 NOTE — Progress Notes (Signed)
EKG CRITICAL VALUE     12 lead EKG performed.  Critical value noted. Magdalene Patricia, RN notified.   Staley Budzinski, CCT 07/02/2017 7:55 AM

## 2017-07-03 LAB — CBC
HCT: 34.2 % — ABNORMAL LOW (ref 36.0–46.0)
HEMOGLOBIN: 11.4 g/dL — AB (ref 12.0–15.0)
MCH: 29.5 pg (ref 26.0–34.0)
MCHC: 33.3 g/dL (ref 30.0–36.0)
MCV: 88.6 fL (ref 78.0–100.0)
PLATELETS: 299 10*3/uL (ref 150–400)
RBC: 3.86 MIL/uL — AB (ref 3.87–5.11)
RDW: 12.8 % (ref 11.5–15.5)
WBC: 7.9 10*3/uL (ref 4.0–10.5)

## 2017-07-03 NOTE — Care Management Note (Addendum)
Case Management Note  Patient Details  Name: [name] MRN: [id number] Date of Birth: Feb 10, 1966  Subjective/Objective: Pt presented for NStemi- Plan for home on Brilinta.                    Action/Plan: Benefits Check completed for Brilinta.Co-pay card given to patient.     S/W ANDREA @  [facility] # [phone]    BRILINTA  90 MG BID   COVER- YES  CO-PAY- 359.16  TIER- 2 DRUG  PRIOR APPROVAL- NO   DEDUCTIBLE - NOT MET   PREFERRED PHARMACY : [facility]   Expected Discharge Date:  07/03/17               Expected Discharge Plan:  Home/Self Care  In-House Referral:  NA  Discharge planning Services  CM Consult, Medication Assistance  Post Acute Care Choice:  NA Choice offered to:  NA  DME Arranged:  N/A DME Agency:  NA  HH Arranged:  NA HH Agency:  NA  Status of Service:  Completed, signed off  If discussed at [place] of Stay Meetings, dates discussed:    Additional Comments: 07-03-17  1454 Jacqlyn Krauss, RN,BSN [phone] Pt's family member called [employer] in regards to co pay card. Pt will be able to utilize the 30 day free card. From the co pay card- $200.00 should be taken off at least of above payment. CM did make pt aware that she may be able to get samples from the Cardiology Office. CM did provide pt # to Billing 2/2 patient stating she is in a hardship. No further needs from CM at this time.  Bethena Roys, RN 07/03/2017, 2:12 PM

## 2017-07-03 NOTE — Progress Notes (Signed)
CARDIAC REHAB PHASE I   PRE:  Rate/Rhythm: 92 SR  BP:  Supine: 108/79  Sitting:   Standing:    SaO2: 97%RA  MODE:  Ambulation: 470 ft   POST:  Rate/Rhythm: 111 ST  BP:  Supine:   Sitting: 119/76  Standing:    SaO2: 100%RA 1005-1026 Pt walked 470 ft on RA with slow steady gait. No CP. Groin less sore. Gave stent card. Has not seen case manager yet for brilinta. RN aware pt needs to see case manager.   Graylon Good, RN BSN  07/03/2017 10:23 AM

## 2017-07-03 NOTE — Plan of Care (Signed)
  Progressing Clinical Measurements: Will remain free from infection 07/03/2017 0247 - Progressing by Colonel Bald, RN Note No s/s of infection noted. Cardiac: Vascular access site(s) Level 0-1 will be maintained 07/03/2017 0247 - Progressing by Colonel Bald, RN Note Right radial site Level 1 d/t bruising.  Right groin Level 1 d/t small hematoma.

## 2017-07-03 NOTE — Progress Notes (Signed)
The patient has been seen in conjunction with Kathyrn Drown, [facility]. All aspects of care have been considered and discussed. The patient has been personally interviewed, examined, and all clinical data has been reviewed.   Appears depressed.  Currently on guideline directed therapy for risk modification.  Strongly encouraged phase 2 cardiac rehab.  Needs further ambulation today and plan discharge in a.m.   Progress Note  Patient Name: [name] Date of Encounter: 07/03/2017  Primary Cardiologist: Dr. Gwenlyn Found  Subjective   Pt feels well this AM. She denies chest pain or associated symptoms. She remains tender to touch at her right groin site. Negative for pseudo and/or hematoma per duplex performed on 07/02/17.   Inpatient Medications    Scheduled Meds: . acetaminophen  1,000 mg Oral Once  . aspirin  81 mg Oral Daily  . atorvastatin  80 mg Oral q1800  . metoprolol tartrate  12.5 mg Oral BID  . sodium chloride flush  3 mL Intravenous Q12H  . ticagrelor  90 mg Oral BID   Continuous Infusions: . sodium chloride    . nitroGLYCERIN Stopped (07/01/17 0054)   PRN Meds: sodium chloride, acetaminophen, fentaNYL (SUBLIMAZE) injection, morphine injection, nitroGLYCERIN, ondansetron (ZOFRAN) IV, sodium chloride flush   Vital Signs    Vitals:   07/02/17 1900 07/02/17 2039 07/02/17 2047 07/03/17 0451  BP:  111/72  109/70  Pulse:    79  Resp:   18 19  Temp: 98.4 F (36.9 C)  97.9 F (36.6 C) 98.7 F (37.1 C)  TempSrc: Oral  Oral Oral  SpO2:   100% 99%  Weight:    164 lb 0.4 oz (74.4 kg)  Height:    5\' 4"  (1.626 m)    Intake/Output Summary (Last 24 hours) at 07/03/2017 0835 Last data filed at 07/03/2017 0500 Gross per 24 hour  Intake 1080 ml  Output -  Net 1080 ml   Filed Weights   06/30/17 1829 07/03/17 0451  Weight: 162 lb (73.5 kg) 164 lb 0.4 oz (74.4 kg)    Physical Exam   General: Well developed, well nourished, NAD Skin: Warm, dry, intact  Head:  Normocephalic, atraumatic, clear, moist mucus membranes. Neck: Negative for carotid bruits. No JVD Lungs:Clear to ausculation bilaterally. No wheezes, rales, or rhonchi. Breathing is unlabored. Cardiovascular: RRR with S1 S2. No murmurs, rubs, gallops, or LV heave appreciated. Abdomen: Soft, non-tender, non-distended with normoactive bowel sounds. No obvious abdominal masses. MSK: Strength and tone appear normal for age. 5/5 in all extremities Extremities: No edema. No clubbing or cyanosis. DP/PT pulses 2+ bilaterally. Right groin tender to palpation.  Neuro: Alert and oriented. No focal deficits. No facial asymmetry. MAE spontaneously. Psych: Responds to questions appropriately with normal affect.    Labs    Chemistry Recent Labs  Lab 06/30/17 1952 07/01/17 0320 07/01/17 1051  NA 139 136 137  K 4.1 4.0 4.1  CL 104 106 106  CO2 23 19* 20*  GLUCOSE 103* 117* 94  BUN 6 9 9   CREATININE 0.79 0.85 0.96  CALCIUM 11.1* 10.2 9.7  GFRNONAA >60 >60 >60  GFRAA >60 >60 >60  ANIONGAP 12 11 11      Hematology Recent Labs  Lab 07/01/17 1051 07/02/17 0258 07/03/17 0448  WBC 10.5 9.0 7.9  RBC 4.16 3.69* 3.86*  HGB 12.7 11.1* 11.4*  HCT 36.7 32.7* 34.2*  MCV 88.2 88.6 88.6  MCH 30.5 30.1 29.5  MCHC 34.6 33.9 33.3  RDW 12.8 12.8 12.8  PLT  306 275 299    Cardiac Enzymes Recent Labs  Lab 07/01/17 0320 07/01/17 1051  TROPONINI 4.63* 4.32*    Recent Labs  Lab 06/30/17 2012  TROPIPOC 3.56*     BNPNo results for input(s): BNP, PROBNP in the last 168 hours.   DDimer  Recent Labs  Lab 06/30/17 1952  DDIMER 0.61*     Radiology    No results found.  Telemetry    07/03/17 NSR- Personally Reviewed  ECG    07/02/17  NSR T-wave inversion in II, III, aVF and V5-6- Personally Reviewed  Cardiac Studies   Cath 06/30/17:  Prox LAD lesion is 70% stenosed.  3rd Mrg lesion is 100% stenosed.  Prox Cx to Mid Cx lesion is 90% stenosed.  A stent was successfully  placed.  Post intervention, there is a 0% residual stenosis.  There is mild to moderate left ventricular systolic dysfunction.  LV end diastolic pressure is normal.  The left ventricular ejection fraction is 45-50% by visual estimate.   Ms. Toman had an out-of-hospital lateral STEMI probably on Friday with post-MI angina and positive enzymes. Her culprit lesion was a proximal to mid hazy appearing high grade AV groove circumflex. The distal branch was occluded near its termination probably from thromboembolism. She had a excellent endoscopic result from PCI and drug-eluting stenting using a synergy 3 mm x 16 mm long drug-eluting stent. She had preserved LV function with inferoapical wall hypokinesia and an ejection fraction of approximately 45%. She will require standard post MI pharmacology including high-dose statin drug, dual antibiotic therapy including aspirin and Brilenta uninterrupted for 12 months, beta blocker and ACE inhibitor. Cardiac risk factor modification will be reinforced. Intractable continued every 4 hours full dose and then will be discontinued. Femoral sheath will be removed after that and pressure held. She did have a small hematoma at the end of the case.  Patient Profile     52 y.o. female acute coronary syndrome who presented acutely with EKG changes. Cath demonstrated high-grade circumflex disease which was stented with DES (synergy 3 mm x 16 mm) .Residual 50-70% to mid LAD. Normal LV EF with normal filling pressures. Postprocedure groin hematoma>> duplex to the right femoral cath site completed 07/02/17 with no evidence of pseudoaneurysm or hematoma.   Assessment & Plan    1. Anterolateral infarction: -DES x 1stent placed to LCx, residual disease in midLAD (50-70%) -Denies CP this AM -On BB, ASA, high intensity statin for secondary prevention of further disease>>>ACE if BP can tolerate>. Soft this admission. Anticipate that this will need to be added as  outpatient -EF 55-60% on 07/01/17 -Plan for DAPT for at least 1 year (ASA, Brilinta) -Cardiac rehab phase I>> encourage pt to participate in phase II post discharge  2. Right groin hematoma: -Right groin duplex completed 07/02/17 with no evidence of pseudoaneurysm or hematoma -Continues to have discomfort with palpation to the right groin -Hb stable>>12.7>11.1>11.4  3. HLD: -Target LDL less than 70>>>86, today  -CHO-136, HDL- 33, LDL- 86, Trig- 87 -On statin high intensity therapy   4. Tobacco abuse: -Smoking cessation encouraged -Pt appears ready to stop at discharge  Signed, Kathyrn Drown NP-C [place] Pager: [phone] 07/03/2017, 8:35 AM   For questions or updates, please contact   Please consult [email or web address] for contact info under Cardiology/STEMI.

## 2017-07-04 ENCOUNTER — Encounter (HOSPITAL_COMMUNITY): Payer: Self-pay | Admitting: Cardiology

## 2017-07-04 DIAGNOSIS — R9431 Abnormal electrocardiogram [ECG] [EKG]: Secondary | ICD-10-CM

## 2017-07-04 DIAGNOSIS — R079 Chest pain, unspecified: Secondary | ICD-10-CM

## 2017-07-04 DIAGNOSIS — R918 Other nonspecific abnormal finding of lung field: Secondary | ICD-10-CM

## 2017-07-04 DIAGNOSIS — Z9861 Coronary angioplasty status: Secondary | ICD-10-CM

## 2017-07-04 LAB — CBC
HEMATOCRIT: 34.1 % — AB (ref 36.0–46.0)
HEMOGLOBIN: 11.5 g/dL — AB (ref 12.0–15.0)
MCH: 29.9 pg (ref 26.0–34.0)
MCHC: 33.7 g/dL (ref 30.0–36.0)
MCV: 88.8 fL (ref 78.0–100.0)
Platelets: 305 10*3/uL (ref 150–400)
RBC: 3.84 MIL/uL — AB (ref 3.87–5.11)
RDW: 12.8 % (ref 11.5–15.5)
WBC: 7 10*3/uL (ref 4.0–10.5)

## 2017-07-04 MED ORDER — ATORVASTATIN CALCIUM 80 MG PO TABS: 80.0000 mg | ORAL_TABLET | Freq: Every day | ORAL | 2 refills | Status: DC

## 2017-07-04 MED ORDER — TICAGRELOR 90 MG PO TABS: 90.0000 mg | ORAL_TABLET | Freq: Two times a day (BID) | ORAL | 2 refills | Status: DC

## 2017-07-04 MED ORDER — NITROGLYCERIN 0.4 MG SL SUBL: 0.4000 mg | SUBLINGUAL_TABLET | SUBLINGUAL | 0 refills | Status: DC | PRN

## 2017-07-04 MED ORDER — METOPROLOL TARTRATE 25 MG PO TABS: 12.5000 mg | ORAL_TABLET | Freq: Two times a day (BID) | ORAL | 2 refills | Status: DC

## 2017-07-04 MED ORDER — ASPIRIN 81 MG PO CHEW: 81.0000 mg | CHEWABLE_TABLET | Freq: Every day | ORAL | Status: DC

## 2017-07-04 NOTE — Discharge Summary (Signed)
The patient has been seen in conjunction with Kathyrn Drown, [facility] All aspects of care have been considered and discussed. The patient has been personally interviewed, examined, and all clinical data has been reviewed.   No anginal symptoms.  No ventricular ectopy on telemetry.  Clinically the patient appears very sad and depressed.  Again the room is dark.  No evidence of hematoma or pseudoaneurysm.  Plan is discharged today with TOC follow-up.  Encouraged phase 2 cardiac rehab.  Should not return to work until after seen in [facility].  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  Discharge Summary    Patient ID: [name],  MRN: [id number], DOB/AGE: 10-Sep-1965 52 y.o.  Admit date: 06/30/2017 Discharge date: 07/04/2017  Primary Care Provider: Shawnee Knapp Primary Cardiologist: Quay Burow, MD  Discharge Diagnoses    Principal Problem:   NSTEMI (non-ST elevated myocardial infarction) [facility]) Active Problems:   Femoral artery hematoma complicating cardiac catheterization   Hyperlipidemia with target LDL less than 70   Tobacco abuse   Lung nodule, multiple   Post PTCA   Abnormal EKG   Acute chest pain  Allergies Allergies  Allergen Reactions  . Hydrocodone Hives and Itching    Diagnostic Studies/Procedures    Cath 06/30/17:  Prox LAD lesion is 70% stenosed.  3rd Mrg lesion is 100% stenosed.  Prox Cx to Mid Cx lesion is 90% stenosed.  A stent was successfully placed.  Post intervention, there is a 0% residual stenosis.  There is mild to moderate left ventricular systolic dysfunction.  LV end diastolic pressure is normal.  The left ventricular ejection fraction is 45-50% by visual estimate.  Ms. Trauger had an out-of-hospital lateral STEMI probably on Friday with post-MI angina and positive enzymes. Her culprit lesion was a proximal to mid hazy appearing high  grade AV groove circumflex. The distal branch was occluded near its termination probably from thromboembolism. She had a excellent endoscopic result from PCI and drug-eluting stenting using a synergy 3 mm x 16 mm long drug-eluting stent. She had preserved LV function with inferoapical wall hypokinesia and an ejection fraction of approximately 45%. She will require standard post MI pharmacology including high-dose statin drug, dual antibiotic therapy including aspirin and Brilenta uninterrupted for 12 months, beta blocker and ACE inhibitor. Cardiac risk factor modification will be reinforced. Intractable continued every 4 hours full dose and then will be discontinued. Femoral sheath will be removed after that and pressure held. She did have a small hematoma at the end of the case.  Echo 07/01/17: Study Conclusions - Left ventricle: The cavity size was normal. Wall thickness was   increased in a pattern of mild LVH. Systolic function was normal.   The estimated ejection fraction was in the range of 55% to 60%.   Wall motion was normal; there were no regional wall motion   abnormalities. Left ventricular diastolic function parameters   were normal. - Atrial septum: No defect or patent foramen ovale was identified.   History of Present Illness     [name] is a 52 y.o. female w/ a h/o sarcoidosis but no significant cardiac hx or comorbid cardiac risk factors other than tobacco abuse and +FHx. She presented to the ED on 06/30/17 with a 2-day history of constant, mid-sternal, "burnin /aching" chest pain rated a 6/10 that began last Friday at rest. She  stated that it has intermittently waxed and waned, however proceeded to more constant in nature. She states the pain radiated down her left arm with associated numbness, SOB, and nausea. The only thing that alleviated her symptoms was sleep, however the pain returns once she wakes up and worsens with exertion.   Hospital Course     Once in the ED,  she was given 1 SL NTG which mildly alleviated the pain. Her initial POtrop levels were elevated at 4.63>>4.32. A CXR was completed which showed no active cardiopulmonary disease. CT angio with no pulmonary embolus, however with multiple lung nodules with emphysemic changes  and recommendations for follow up chest CT in 3-6 months. EKG with NSR and borderline ST elevation in lead III with TWI in the inferior and anterolateral leads>>new compared to tracing from 2016. She was not taken immediately to the cath lab due to the duration of her symptoms and the evolving nature of her EKG changes. An echo was performed which showed an EF of 55-60%. She was started on BB, statin and ASA was given. IV hep gtt was started in anticipation of going to the cath lab.   On 07/01/17 the pt was taken to the cath lab which showed Prox LAD lesion is 70% stenosed, 3rd Mrg lesion is 100% stenosed, Prox Cx to Mid Cx lesion is 90% stenosed in which a a stent was successfully placed. Post intervention, there is a 0% residual stenosis. There is mild to moderate left ventricular systolic dysfunction. LV end diastolic pressure is normal. The left ventricular ejection fraction is 45-50% by visual estimate.  Right groin access was achieved for cath. Post-cath, she reported moderate to severe groin. A duplex was performed to rule out pseudoaneurysm and/or hematoma which was negative.   Of note on admission a CT angio (completed for an elevated d-dimer, 0.61) was completed which showed multiple lung nodules and recommendations for follow up CT in 3-6 months. Hx of sarcoidosis.    Plan: Standard post MI pharmacology including high-dose statin, dual antiplatelet  therapy including aspirin and Brilenta uninterrupted for 12 months, beta blocker and ACE inhibitor as her BP can tolerate.   Seen by CSW prior to discharge and given 30-day card for Brilinta. May be an issue do to finances with long term (85-month) Brilinita use. May need to  change to Plavix therapy if remains a financial burden.   The patient was see and examined by Dr. Tamala Julian who feels that she is stable and ready for discharge on 07/04/17.   General: Well developed, well nourished, NAD Skin: Warm, dry, intact  Head: Normocephalic, atraumatic, sclera non-icteric, no xanthomas, clear, moist mucus membranes. Neck: Negative for carotid bruits. No JVD Lungs:Clear to ausculation bilaterally. No wheezes, rales, or rhonchi. Breathing is unlabored. Cardiovascular: RRR with S1 S2. No murmurs, rubs, gallops, or LV heave appreciated. Abdomen: Soft, non-tender, non-distended with normoactive bowel sounds. No hepatomegaly, No rebound/guarding. No obvious abdominal masses. MSK: Strength and tone appear normal for age. 5/5 in all extremities Extremities: No edema. No clubbing or cyanosis. DP/PT pulses 2+ bilaterally. Right groin with post-cath pain>>no hematoma/pseudoaneuysm Neuro: Alert and oriented. No focal deficits. No facial asymmetry. MAE spontaneously. Psych: Responds to questions appropriately with normal affect.    Consultants: None ____________  Discharge Vitals Blood pressure 113/77, pulse 92, temperature 98 F (36.7 C), temperature source Oral, resp. rate 18, height 5\' 4"  (1.626 m), weight 162 lb 8 oz (73.7 kg), SpO2 100 %.  Filed Weights   06/30/17 1829  07/03/17 0451 07/04/17 0455  Weight: 162 lb (73.5 kg) 164 lb 0.4 oz (74.4 kg) 162 lb 8 oz (73.7 kg)    Labs & Radiologic Studies    CBC Recent Labs    07/03/17 0448 07/04/17 0658  WBC 7.9 7.0  HGB 11.4* 11.5*  HCT 34.2* 34.1*  MCV 88.6 88.8  PLT 299 355   Basic Metabolic Panel No results for input(s): NA, K, CL, CO2, GLUCOSE, BUN, CREATININE, CALCIUM, MG, PHOS in the last 72 hours. Cardiac Enzymes No results for input(s): CKTOTAL, CKMB, CKMBINDEX, TROPONINI in the last 72 hours. BNP Invalid input(s): POCBNP D-Dimer No results for input(s): DDIMER in the last 72 hours. Hemoglobin A1C Recent  Labs    07/02/17 0258  HGBA1C 5.8*   _____________  Dg Chest 2 View  Result Date: 06/30/2017 CLINICAL DATA:  Chest pain for 2 days EXAM: CHEST  2 VIEW COMPARISON:  03/20/2017 FINDINGS: The heart size and mediastinal contours are within normal limits. Both lungs are clear. The visualized skeletal structures are unremarkable. IMPRESSION: No active cardiopulmonary disease. Electronically Signed   By: Donavan Foil M.D.   On: 06/30/2017 19:47   Ct Angio Chest Pe W And/or Wo Contrast  Result Date: 06/30/2017 CLINICAL DATA:  Chest pain for 2 days. Shortness of breath and left arm numbness. Nausea and headache today. History of sarcoidosis and anemia. EXAM: CT ANGIOGRAPHY CHEST WITH CONTRAST TECHNIQUE: Multidetector CT imaging of the chest was performed using the standard protocol during bolus administration of intravenous contrast. Multiplanar CT image reconstructions and MIPs were obtained to evaluate the vascular anatomy. CONTRAST:  113mL ISOVUE-370 IOPAMIDOL (ISOVUE-370) INJECTION 76% COMPARISON:  Chest radiograph 06/30/2017 FINDINGS: Cardiovascular: Good opacification of the central and segmental pulmonary arteries. No focal filling defects demonstrated. No evidence of significant pulmonary embolus. Normal heart size. Normal caliber thoracic aorta. Mediastinum/Nodes: No significant lymphadenopathy in the chest. A few scattered calcified lymph nodes are present. Esophagus is decompressed. Lungs/Pleura: Mild emphysematous changes mostly in the apex and periphery of the lungs. Mild atelectasis in the lung bases. Calcified granulomas in the right lung base. Dominant nodule in the left upper lung measuring 6 mm diameter. Smaller adjacent nodules are present. There a few scattered nodules in the lungs measuring less than 2 mm diameter. Irregular nodular opacities in the mid lungs along the peribronchial vascular region. Focal cavitation of a nodule in the right middle lung measuring 10 mm diameter. These lesions  may be related to the history of sarcoidosis or may be inflammatory but without any comparison available, follow-up in 3 months is suggested. No focal consolidation or airspace disease. Upper Abdomen: Limited visualization of the upper abdominal organs demonstrates no obvious abnormality. Musculoskeletal: No chest wall abnormality. No acute or significant osseous findings. Review of the MIP images confirms the above findings. IMPRESSION: 1. No evidence of significant pulmonary embolus. 2. Multiple pulmonary nodules including a dominant nodule in the left upper lung measuring 6 mm and irregular mid lung nodules bilaterally. Right mid lung nodule measures 10 mm and contains cavitation. Non-contrast chest CT at 3-6 months is recommended. If the nodules are stable at time of repeat CT, then future CT at 18-24 months (from today's scan) is considered optional for low-risk patients, but is recommended for high-risk patients. This recommendation follows the consensus statement: Guidelines for Management of Incidental Pulmonary Nodules Detected on CT Images: From the Fleischner Society 2017; Radiology 2017; 284:228-243. 3. Mild emphysematous changes in the lungs. Emphysema (ICD10-J43.9). Electronically Signed   By: Gwyndolyn Saxon  Gerilyn Nestle M.D.   On: 06/30/2017 21:29   CT angio: IMPRESSION: 1. No evidence of significant pulmonary embolus. 2. Multiple pulmonary nodules including a dominant nodule in the left upper lung measuring 6 mm and irregular mid lung nodules bilaterally. Right mid lung nodule measures 10 mm and contains cavitation. Non-contrast chest CT at 3-6 months is recommended. If the nodules are stable at time of repeat CT, then future CT at 18-24 months (from today's scan) is considered optional for low-risk patients, but is recommended for high-risk patients. This recommendation follows the consensus statement: Guidelines for Management of Incidental Pulmonary Nodules Detected on CT Images: From the  Fleischner Society 2017; Radiology 2017; 284:228-243. 3. Mild emphysematous changes in the lungs.  Disposition   Pt is being discharged home today in good condition.  Follow-up Plans & Appointments   Follow-up Information    Barrett, Evelene Croon, PA-C Follow up on 07/16/2017.   Specialties:  Cardiology, Radiology Why:  Your follow up appointment will be on 07/16/17 at 0900 with [name], Physician Assistant with Dr. Gwenlyn Found. Please arrive at 0845am for your appointment. Thank you.  Contact information: [address] [place] [address] [phone]          Discharge Instructions    AMB Referral to Cardiac Rehabilitation - Phase II   Complete by:  As directed    Diagnosis:  NSTEMI   Amb Referral to Cardiac Rehabilitation   Complete by:  As directed    Diagnosis:   STEMI Coronary Stents     Call MD for:  redness, tenderness, or signs of infection (pain, swelling, redness, odor or green/yellow discharge around incision site)   Complete by:  As directed    Call MD for:  severe uncontrolled pain   Complete by:  As directed    Call MD for:  temperature >100.4   Complete by:  As directed    Diet - low sodium heart healthy   Complete by:  As directed    Discharge instructions   Complete by:  As directed    If you notice any bleeding such as blood in stool, black tarry stools, blood in urine, nosebleeds or any other unusual bleeding, call your doctor immediately.  No driving for 1 week. No lifting over 5 lbs for 1 week. No sexual activity for 1 week. You may return to work after your follow up appointment at the office. Keep procedure site clean & dry. If you notice increased pain, swelling, bleeding or pus, call/return!  You may shower, but no soaking baths/hot tubs/pools for 1 week.   Increase activity slowly   Complete by:  As directed       Discharge Medications   Allergies as of 07/04/2017      Reactions   Hydrocodone Hives, Itching      Medication  List    STOP taking these medications   ibuprofen 200 MG tablet Commonly known as:  ADVIL,MOTRIN     TAKE these medications   aspirin 81 MG chewable tablet Chew 1 tablet (81 mg total) by mouth daily. Start taking on:  07/05/2017   atorvastatin 80 MG tablet Commonly known as:  LIPITOR Take 1 tablet (80 mg total) by mouth daily at 6 PM.   calcium carbonate 500 MG chewable tablet Commonly known as:  TUMS - dosed in mg elemental calcium Chew 1-2 tablets by mouth 2 (two) times daily as needed for indigestion or heartburn.   metoprolol tartrate 25 MG tablet Commonly known  as:  LOPRESSOR Take 0.5 tablets (12.5 mg total) by mouth 2 (two) times daily.   MUCINEX DM MAXIMUM STRENGTH 60-1200 MG Tb12 Take 1 tablet by mouth daily as needed (congestion).   nitroGLYCERIN 0.4 MG SL tablet Commonly known as:  NITROSTAT Place 1 tablet (0.4 mg total) under the tongue every 5 (five) minutes x 3 doses as needed for chest pain.   ticagrelor 90 MG Tabs tablet Commonly known as:  BRILINTA Take 1 tablet (90 mg total) by mouth 2 (two) times daily.       Aspirin prescribed at discharge?  Yes High Intensity Statin Prescribed? (Lipitor 40-80mg  or Crestor 20-40mg ): Yes Beta Blocker Prescribed? Yes For EF <40%, was ACEI/ARB Prescribed? No: EF > 40% ADP Receptor Inhibitor Prescribed? (i.e. Plavix etc.-Includes Medically Managed Patients): Yes For EF <40%, Aldosterone Inhibitor Prescribed? No: EF > 40% Was EF assessed during THIS hospitalization? Yes Was Cardiac Rehab II ordered? (Included Medically managed Patients): Yes   Outstanding Labs/Studies   None  Duration of Discharge Encounter   Greater than 30 minutes including physician time.  SignedKathyrn Drown NP 07/04/2017, 2:09 PM

## 2017-07-04 NOTE — Progress Notes (Signed)
CARDIAC REHAB PHASE I   PRE:  Rate/Rhythm: 76 SR  BP:  Supine: 123/81  Sitting:   Standing:    SaO2: 100%RA  MODE:  Ambulation: 690 ft   POST:  Rate/Rhythm: 98 SR  BP:  Supine:   Sitting: 119/86  Standing:    SaO2: 98%RA 1058-1115 Pt walked 690 ft with steady gait and tolerated well. Discussed with pt that she must not run out of brilinta as she stated she might be able to afford. Told her she must call cardiology office to notify if she is unable to afford as they may have samples and she has to be on an antiplatelet. She is concerned about being out of work for a year due to MI as her [employer] Rep stated is norm. Going to check with short term disability.   Graylon Good, RN BSN  07/04/2017 11:21 AM

## 2017-07-05 ENCOUNTER — Telehealth: Payer: Self-pay

## 2017-07-05 ENCOUNTER — Telehealth (HOSPITAL_COMMUNITY): Payer: Self-pay

## 2017-07-05 NOTE — Telephone Encounter (Signed)
Transition Care Management Follow-up Telephone Call   Date discharged? 07/04/17   How have you been since you were released from the hospital? Patient states that she is feeling better   Do you understand why you were in the hospital? yes   Do you understand the discharge instructions? yes   Where were you discharged to? home   Items Reviewed:  Medications reviewed: yes  Allergies reviewed: yes  Dietary changes reviewed: yes  Referrals reviewed: yes   Functional Questionnaire:   Activities of Daily Living (ADLs):   She states they are independent in the following: ambulation, bathing and hygiene, feeding, continence, grooming, toileting and dressing States they require assistance with the following: none   Any transportation issues/concerns?: no   Any patient concerns? no   Confirmed importance and date/time of follow-up visits scheduled yes  Provider Appointment booked with Dr. Brigitte Pulse on 07/06/17 @ 1:40 pm.   Confirmed with patient if condition begins to worsen call PCP or go to the ER.  Patient was given the office number and encouraged to call back with question or concerns.  : yes

## 2017-07-05 NOTE — Telephone Encounter (Signed)
Patients insurance is active and benefits verified through [facility] - No co-pay, deductible amount of $2,500/$0.00 has been met, out of pocket amount of $5,000/$0.00 has been met, 20% co-insurance, and no pre-authorization is required. Spoke with [facility] Reference [id number]  Patient will be contacted and scheduled after review by the RN Navigator.

## 2017-07-06 ENCOUNTER — Other Ambulatory Visit: Payer: Self-pay

## 2017-07-06 ENCOUNTER — Encounter: Payer: Self-pay | Admitting: Family Medicine

## 2017-07-06 ENCOUNTER — Ambulatory Visit (INDEPENDENT_AMBULATORY_CARE_PROVIDER_SITE_OTHER): Payer: Commercial Managed Care - PPO | Admitting: Family Medicine

## 2017-07-06 VITALS — BP 130/76 | HR 85 | Temp 98.6°F | Resp 18 | Ht 65.16 in | Wt 166.8 lb

## 2017-07-06 DIAGNOSIS — I25118 Atherosclerotic heart disease of native coronary artery with other forms of angina pectoris: Secondary | ICD-10-CM

## 2017-07-06 DIAGNOSIS — F329 Major depressive disorder, single episode, unspecified: Secondary | ICD-10-CM | POA: Diagnosis not present

## 2017-07-06 DIAGNOSIS — M7050 Other bursitis of knee, unspecified knee: Secondary | ICD-10-CM

## 2017-07-06 DIAGNOSIS — I214 Non-ST elevation (NSTEMI) myocardial infarction: Secondary | ICD-10-CM

## 2017-07-06 MED ORDER — LISINOPRIL 2.5 MG PO TABS: 2.5000 mg | ORAL_TABLET | Freq: Every day | ORAL | 1 refills | Status: DC

## 2017-07-06 MED ORDER — BUPROPION HCL ER (XL) 150 MG PO TB24: 150.0000 mg | ORAL_TABLET | Freq: Every day | ORAL | 0 refills | Status: DC
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
## 2017-07-06 NOTE — Patient Instructions (Addendum)
IF you received an x-ray today, you will receive an invoice from [facility]. Please contact [facility] at [phone] with questions or concerns regarding your invoice.   IF you received labwork today, you will receive an invoice from [facility]. Please contact [facility] at [phone] with questions or concerns regarding your invoice.   Our billing staff will not be able to assist you with questions regarding bills from these companies.  You will be contacted with the lab results as soon as they are available. The fastest way to get your results is to activate your My Chart account. Instructions are located on the last page of this paperwork. If you have not heard from [place] regarding the results in 2 weeks, please contact this office.     ALMOST ALL OF THESE SAME THINGS CAN APPLY AFTER YOU HAVE A HEART ATTACK.  Emotional Health After Stroke Your emotional health may change after a stroke. You may have fear, anxiety, anger, sadness, and other feelings. Some of these changes happen because a stroke can damage your brain and nervous system. You may also have these feelings because coping with a change in your health can feel overwhelming. Depression and other emotional changes can slow your recovery after a stroke. It is important to recognize the symptoms so that you can take steps to strengthen your emotional health. What are some common emotions after a stroke? You may have:  Fear.  Anxiety.  Anger.  Frustration.  Sadness.  Feelings of loss or grief.  Depression.  Crying or laughing at the wrong time or wrong situation (pseudobulbar affect, orPBA).  What are the symptoms of depression? Depression after a stroke can happen right away, or it can show up later. Symptoms of depression may include:  Sleep problems.  Changes in normal eating habits, such as eating too much or too little.  Weight gain or weight loss.  Not having energy or enthusiasm  (lethargy).  Feeling very tired (fatigue).  Avoiding people and activities (social withdrawal).  Irritability.  Crying more than usual.  Mood swings.  Not being able to concentrate.  Feeling hopeless.  Hating yourself.  Having suicidal thoughts.  If you ever feel like you may hurt yourself or others, or have thoughts about taking your own life, get help right away. You can go to your nearest emergency department or call:  Your local emergency services (911 in the [place]  A suicide crisis helpline, such as the [place] at 819-184-5341. This is open 24 hours a day.  What increases my risk of depression? Having a stroke raises the risk of depression. The risk also goes up if you:  Are socially isolated.  Have a family history of depression.  Have a history of depression or other mental health problems before the stroke.  Are unable to work or do activities that you previously enjoyed.  Need help from others for daily activities.  Use drugs or drink alcohol.  Take certain medicines, such as sleeping pills or high blood pressure medicines.  What are some coping methods I can use? Ask your health care provider for help. Your health care provider may recommend treatments for depression, such as:  Talk therapy or counseling with a mental health professional. This may include cognitive behavioral therapy (CBT) to help change your patterns of thinking.  Medical therapies, such as brain stimulation or light therapy.  Lifestyle changes, such as eating a healthy diet and avoiding alcohol.  Antidepressant medicines.  Alternative  therapies, such as acupuncture, music therapy, or pet therapy.  Other coping strategies you may try include:  Physical therapy or exercises. Try to do some exercise every day.  Writing your thoughts in a journal. An example might be keeping track of unrealistic thoughts or keeping a log of things you are thankful  for.  Practicing good sleep habits, such as getting up at the same time every day.  Following a predictable routine each day.  Participating in activities that make you laugh.  Mindfulness therapy. This may include meditation and other techniques to lower your stress.  Joining a support group for people who are recovering from a stroke. These groups provide social interaction and help you feel connected to others. Your health care team can help you find a support group in your area.  Summary  Your emotional health may change after a stroke. You may have fear, anxiety, anger, sadness, and other feelings.  It is important to recognize the symptoms of depression and other emotional problems.  If you experience emotional changes, let your loved ones know and contact your health care provider. This information is not intended to replace advice given to you by your health care provider. Make sure you discuss any questions you have with your health care provider. Document Released: 08/31/2016 Document Revised: 08/31/2016 Document Reviewed: 08/31/2016 Elsevier Interactive Patient Education  [employer].

## 2017-07-09 ENCOUNTER — Emergency Department (HOSPITAL_BASED_OUTPATIENT_CLINIC_OR_DEPARTMENT_OTHER)
Admit: 2017-07-09 | Discharge: 2017-07-09 | Disposition: A | Discharge status: Home / Self Care | Payer: Commercial Managed Care - PPO | Attending: Emergency Medicine | Admitting: Emergency Medicine

## 2017-07-09 ENCOUNTER — Telehealth: Payer: Self-pay | Admitting: Family Medicine

## 2017-07-09 ENCOUNTER — Emergency Department (HOSPITAL_COMMUNITY): Payer: Commercial Managed Care - PPO

## 2017-07-09 ENCOUNTER — Encounter (HOSPITAL_COMMUNITY): Payer: Self-pay | Admitting: *Deleted

## 2017-07-09 ENCOUNTER — Emergency Department (HOSPITAL_COMMUNITY)
Admission: EM | Admit: 2017-07-09 | Discharge: 2017-07-09 | Disposition: A | Discharge status: Home / Self Care | Payer: Commercial Managed Care - PPO | Attending: Emergency Medicine | Admitting: Emergency Medicine

## 2017-07-09 DIAGNOSIS — K5903 Drug induced constipation: Secondary | ICD-10-CM | POA: Diagnosis not present

## 2017-07-09 DIAGNOSIS — R1031 Right lower quadrant pain: Secondary | ICD-10-CM

## 2017-07-09 DIAGNOSIS — K5909 Other constipation: Secondary | ICD-10-CM | POA: Diagnosis not present

## 2017-07-09 DIAGNOSIS — N3000 Acute cystitis without hematuria: Secondary | ICD-10-CM | POA: Diagnosis not present

## 2017-07-09 DIAGNOSIS — F17210 Nicotine dependence, cigarettes, uncomplicated: Secondary | ICD-10-CM | POA: Insufficient documentation

## 2017-07-09 DIAGNOSIS — M79609 Pain in unspecified limb: Secondary | ICD-10-CM

## 2017-07-09 DIAGNOSIS — R109 Unspecified abdominal pain: Secondary | ICD-10-CM | POA: Diagnosis not present

## 2017-07-09 DIAGNOSIS — K661 Hemoperitoneum: Secondary | ICD-10-CM | POA: Diagnosis not present

## 2017-07-09 DIAGNOSIS — K5900 Constipation, unspecified: Secondary | ICD-10-CM | POA: Diagnosis not present

## 2017-07-09 LAB — COMPREHENSIVE METABOLIC PANEL
ALT: 43 U/L (ref 14–54)
AST: 41 U/L (ref 15–41)
Albumin: 3.5 g/dL (ref 3.5–5.0)
Alkaline Phosphatase: 124 U/L (ref 38–126)
Anion gap: 9 (ref 5–15)
BILIRUBIN TOTAL: 1 mg/dL (ref 0.3–1.2)
BUN: 9 mg/dL (ref 6–20)
CO2: 22 mmol/L (ref 22–32)
CREATININE: 0.8 mg/dL (ref 0.44–1.00)
Calcium: 9.5 mg/dL (ref 8.9–10.3)
Chloride: 107 mmol/L (ref 101–111)
GFR calc Af Amer: >60 mL/min (ref >60)
Glucose, Bld: 121 mg/dL — ABNORMAL HIGH (ref 65–99)
Potassium: 4.2 mmol/L (ref 3.5–5.1)
Sodium: 138 mmol/L (ref 135–145)
TOTAL PROTEIN: 6.8 g/dL (ref 6.5–8.1)

## 2017-07-09 LAB — URINALYSIS, ROUTINE W REFLEX MICROSCOPIC
Bilirubin Urine: NEGATIVE
Glucose, UA: NEGATIVE mg/dL
Ketones, ur: NEGATIVE mg/dL
NITRITE: POSITIVE — AB
PH: 7 (ref 5.0–8.0)
Protein, ur: NEGATIVE mg/dL
SPECIFIC GRAVITY, URINE: 1.01 (ref 1.005–1.030)

## 2017-07-09 LAB — CBC
HCT: 35.4 % — ABNORMAL LOW (ref 36.0–46.0)
Hemoglobin: 12.3 g/dL (ref 12.0–15.0)
MCH: 30.5 pg (ref 26.0–34.0)
MCHC: 34.7 g/dL (ref 30.0–36.0)
MCV: 87.8 fL (ref 78.0–100.0)
PLATELETS: 420 10*3/uL — AB (ref 150–400)
RBC: 4.03 MIL/uL (ref 3.87–5.11)
RDW: 12.6 % (ref 11.5–15.5)
WBC: 10.1 10*3/uL (ref 4.0–10.5)

## 2017-07-09 LAB — LIPASE, BLOOD: Lipase: 37 U/L (ref 11–51)

## 2017-07-09 MED ORDER — CEPHALEXIN 250 MG PO CAPS: 500.0000 mg | ORAL_CAPSULE | Freq: Once | ORAL | Status: DC

## 2017-07-09 MED ORDER — IOPAMIDOL (ISOVUE-300) INJECTION 61%
INTRAVENOUS | Status: AC
  Administered 2017-07-09: 100 mL
  Filled 2017-07-09: qty 100

## 2017-07-09 MED ORDER — OXYCODONE-ACETAMINOPHEN 5-325 MG PO TABS
1.0000 | ORAL_TABLET | Freq: Once | ORAL | Status: AC
  Administered 2017-07-09: 1 via ORAL
  Filled 2017-07-09: qty 1

## 2017-07-09 MED ORDER — CEPHALEXIN 500 MG PO CAPS: 500.0000 mg | ORAL_CAPSULE | Freq: Two times a day (BID) | ORAL | 0 refills | Status: DC

## 2017-07-09 NOTE — ED Notes (Signed)
PAGED CARDMASTER TO DR. LITTLE

## 2017-07-09 NOTE — ED Triage Notes (Signed)
To ED for eval of right groin pain and hematoma at cath site- procedure done last Sunday. Pt states she has had swelling to the site since discharge but last night pain and 'hardness' started. Also numbness to right leg.

## 2017-07-09 NOTE — ED Notes (Signed)
Instructions not e-signed.  Tablet not working.  Pt verbalized understanding of dc and f/u instructions.  There are no further needs

## 2017-07-09 NOTE — ED Notes (Signed)
Patient is ambulatory to restroom to provide with urine sample.

## 2017-07-09 NOTE — ED Notes (Signed)
Pt called at this time to inform her that her first dose of keflex was inadvertently not administered while she was here, prior to discharge.  Pt states "I am going to the pharmacy right now, and I will take my first dose tonight."  Physician aware.  There are no further needs.
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
## 2017-07-09 NOTE — ED Notes (Signed)
Pt back from X-Ray.  

## 2017-07-09 NOTE — ED Notes (Signed)
Patient transported to CT 

## 2017-07-09 NOTE — ED Notes (Signed)
Patient transported to Ultrasound 

## 2017-07-09 NOTE — ED Provider Notes (Signed)
Patient was initially seen by Dr. Rex Kras.  Please see her note.  Patient's urinalysis does suggest urinary tract infection.  Patient CT scan does show a retroperitoneal hematoma.  Fortunately the patient's hemoglobin is stable.  Discussed case with cardiology.  We will have her continue her medications.  Patient is on very low-dose beta-blocker so we will continue that for now.  I will start the patient on antibiotics for her UTI.  She is stable for discharge and outpatient follow-up.   Dorie Rank, MD 07/09/17 2008

## 2017-07-09 NOTE — Progress Notes (Signed)
Right groin limited duplex: no evidence of pseudoaneurysm or hematoma. Unchanged from study 07/02/17. Landry Mellow, RDMS, RVT

## 2017-07-09 NOTE — ED Notes (Signed)
Pt returned from US at this time.

## 2017-07-09 NOTE — Discharge Instructions (Addendum)
Continue your current medications, follow-up with your cardiologist.  Start taking the antibiotics for the urinary tract infection.  Return for fever, lightheadedness or other concerning symptoms

## 2017-07-09 NOTE — Telephone Encounter (Signed)
Patient needs Provider statement completed for her last OV and the time she was in hospital. I was not sure about the dates or the exact diagnosies so I have left the form blank I will place the forms in Dr Raul Del box on 07/09/17 please return to the FMLA/Disability box at the 102 checkout desk within 5-7 business day. Thank you

## 2017-07-09 NOTE — ED Provider Notes (Signed)
[facility] EMERGENCY DEPARTMENT Provider Note   CSN: [id number] Arrival date & time: 07/09/17  1113     History   Chief Complaint Chief Complaint  Patient presents with  . abd/groin pain    HPI [name] is a 52 y.o. female.  52 year old female with past medical history including recent NSTEMI s/p stenting, hyperlipidemia, sarcoidosis who presents with right groin pain.  The patient had cardiac cath on 1/20 and has had some right groin pain at the cath site since then.  She feels like the pain has been worse since last night and she has noticed a hard area at the site.  She reports some pain down the front of her right leg.  She also reports some right lower abdominal pain that has been present for approximately 2 days, intermittent and occasionally radiating to R back.  She has had nausea but no vomiting.  No urinary symptoms, fevers, or diarrhea.  She does endorse constipation.   The history is provided by the patient.    Past Medical History:  Diagnosis Date  . Anemia   . Fibroid   . NSTEMI (non-ST elevated myocardial infarction) ([place])    s/p DES to LCX 07/02/17  . Post PTCA   . Sarcoidosis     Patient Active Problem List   Diagnosis Date Noted  . Lung nodule, multiple 07/04/2017  . Post PTCA 07/04/2017  . Abnormal EKG   . Acute chest pain   . Femoral artery hematoma complicating cardiac catheterization 07/02/2017  . Hyperlipidemia with target LDL less than 70 07/02/2017  . Tobacco abuse 07/02/2017  . NSTEMI (non-ST elevated myocardial infarction) ([place])   . Rectal bleeding 10/18/2016  . Special screening for malignant neoplasms, colon 10/18/2016  . Vitamin D deficiency 04/16/2015    Past Surgical History:  Procedure Laterality Date  . ABDOMINAL HYSTERECTOMY    . CESAREAN SECTION    . CORONARY/GRAFT ACUTE MI REVASCULARIZATION N/A 06/30/2017   Procedure: Coronary/Graft Acute MI Revascularization;  Surgeon: Lorretta Harp, MD;   Location: [facility];  Service: Cardiovascular;  Laterality: N/A;  . LEFT HEART CATH AND CORONARY ANGIOGRAPHY N/A 06/30/2017   Procedure: LEFT HEART CATH AND CORONARY ANGIOGRAPHY;  Surgeon: Lorretta Harp, MD;  Location: [facility];  Service: Cardiovascular;  Laterality: N/A;    OB History    No data available       Home Medications    Prior to Admission medications   Medication Sig Start Date End Date Taking? Authorizing Provider  aspirin 81 MG chewable tablet Chew 1 tablet (81 mg total) by mouth daily. 07/05/17   Tommie Raymond, NP  atorvastatin (LIPITOR) 80 MG tablet Take 1 tablet (80 mg total) by mouth daily at 6 PM. 07/04/17   Tommie Raymond, NP  buPROPion (WELLBUTRIN XL) 150 MG 24 hr tablet Take 1 tablet (150 mg total) by mouth daily. X 2 weeks, then 2 tabs a day. Call for refill so higher dose can be sent in 07/06/17   Shawnee Knapp, MD  calcium carbonate (TUMS - DOSED IN MG ELEMENTAL CALCIUM) 500 MG chewable tablet Chew 1-2 tablets by mouth 2 (two) times daily as needed for indigestion or heartburn.    [provider]  Dextromethorphan-Guaifenesin (MUCINEX DM MAXIMUM STRENGTH) 60-1200 MG TB12 Take 1 tablet by mouth daily as needed (congestion).    [provider]  lisinopril (PRINIVIL,ZESTRIL) 2.5 MG tablet Take 1 tablet (2.5 mg total) by mouth  daily. 07/06/17   Shawnee Knapp, MD  metoprolol tartrate (LOPRESSOR) 25 MG tablet Take 0.5 tablets (12.5 mg total) by mouth 2 (two) times daily. 07/04/17   Tommie Raymond, NP  nitroGLYCERIN (NITROSTAT) 0.4 MG SL tablet Place 1 tablet (0.4 mg total) under the tongue every 5 (five) minutes x 3 doses as needed for chest pain. 07/04/17   Kathyrn Drown D, NP  ticagrelor (BRILINTA) 90 MG TABS tablet Take 1 tablet (90 mg total) by mouth 2 (two) times daily. 07/04/17   Tommie Raymond, NP    Family History Family History  Problem Relation Age of Onset  . Hypertension Other   . Diabetes Other     Social  History Social History   Tobacco Use  . Smoking status: Current Every Day Smoker    Packs/day: 1.00    Years: 20.00    Pack years: 20.00    Types: Cigarettes  . Smokeless tobacco: Never Used  Substance Use Topics  . Alcohol use: No  . Drug use: No     Allergies   Hydrocodone   Review of Systems Review of Systems All other systems reviewed and are negative except that which was mentioned in HPI   Physical Exam Updated Vital Signs BP 130/81   Pulse 64   Temp 98 F (36.7 C)   Resp 20   Ht 5\' 5"  (1.651 m)   Wt 73.5 kg (162 lb)   SpO2 100%   BMI 26.96 kg/m   Physical Exam  Constitutional: She is oriented to person, place, and time. She appears well-developed and well-nourished. No distress.  HENT:  Head: Normocephalic and atraumatic.  Moist mucous membranes  Eyes: Conjunctivae are normal. Pupils are equal, round, and reactive to light.  Neck: Neck supple.  Cardiovascular: Normal rate, regular rhythm, normal heart sounds and intact distal pulses.  No murmur heard. Pulmonary/Chest: Effort normal and breath sounds normal.  Abdominal: Soft. Bowel sounds are normal. She exhibits no distension. There is tenderness (mild RLQ). There is no rebound and no guarding.  Genitourinary:  Genitourinary Comments: Tenderness of R inguinal canal with small area of firmness, no significant edema or ecchymoses  Musculoskeletal: She exhibits no edema.  Neurological: She is alert and oriented to person, place, and time.  Fluent speech  Skin: Skin is warm and dry.  Psychiatric: She has a normal mood and affect. Judgment normal.  Nursing note and vitals reviewed.  Chaperone was present during exam.   ED Treatments / Results  Labs (all labs ordered are listed, but only abnormal results are displayed) Labs Reviewed  COMPREHENSIVE METABOLIC PANEL - Abnormal; Notable for the following components:      Result Value   Glucose, Bld 121 (*)    All other components within normal limits   CBC - Abnormal; Notable for the following components:   HCT 35.4 (*)    Platelets 420 (*)    All other components within normal limits  LIPASE, BLOOD    EKG  EKG Interpretation None       Radiology Dg Abdomen 1 View  Result Date: 07/09/2017 CLINICAL DATA:  Constipation, lower abdominal pain. EXAM: ABDOMEN - 1 VIEW COMPARISON:  None. FINDINGS: The bowel gas pattern is normal. No renal calculi are noted. Phleboliths are noted in the pelvis. No significant stool burden is noted. IMPRESSION: No evidence of bowel obstruction or ileus. Electronically Signed   By: Marijo Conception, M.D.   On: 07/09/2017 13:06    Procedures Procedures (  including critical care time)  Medications Ordered in ED Medications - No data to display   Initial Impression / Assessment and Plan / ED Course  I have reviewed the triage vital signs and the nursing notes.  Pertinent labs & imaging results that were available during my care of the patient were reviewed by me and considered in my medical decision making (see chart for details).     Mild tenderness at cath site without significant swelling or ecchymoses, no pulsatile mass. Arterial US negative for pseudoaneurysm.  Labs show stable hemoglobin, normal CMP and lipase.  KUB negative for large stool burden to explain her abdominal pain.  After discussion with cardiology, Dr. Stanford Breed, regarding potential complications of cath, will obtain CT abd/ pelvis to r/o retroperitoneal hematoma. UA is also pending.  I am signing out to the oncoming provider, Dr. Tomi Bamberger, who will f/u on imaging.  If imaging is reassuring, anticipate discharge with cardiology follow-up.  Final Clinical Impressions(s) / ED Diagnoses   Final diagnoses:  None    ED Discharge Orders    None       Little, Wenda Overland, MD 07/09/17 [phone]

## 2017-07-10 NOTE — Telephone Encounter (Signed)
I do not have anything in my box °

## 2017-07-10 NOTE — Telephone Encounter (Signed)
Please advise once complete

## 2017-07-10 NOTE — Telephone Encounter (Signed)
Pt called in to follow up on paperwork. Provided her with update. Pt would like to know if someone could call her once forms are faxed so that she will know that they are complete?

## 2017-07-10 NOTE — Telephone Encounter (Signed)
Sorry left work early yesterday due to [employer] place forms in your box this afternoon

## 2017-07-13 ENCOUNTER — Encounter: Payer: Self-pay | Admitting: Family Medicine

## 2017-07-13 NOTE — Telephone Encounter (Signed)
Returned to [employer] box at [employer]

## 2017-07-15 DIAGNOSIS — M25561 Pain in right knee: Secondary | ICD-10-CM | POA: Diagnosis not present

## 2017-07-15 NOTE — Telephone Encounter (Signed)
Paperwork scanned and faxed to [employer] on 07/15/17

## 2017-07-16 ENCOUNTER — Encounter: Payer: Self-pay | Admitting: Physician Assistant

## 2017-07-16 ENCOUNTER — Ambulatory Visit (INDEPENDENT_AMBULATORY_CARE_PROVIDER_SITE_OTHER): Payer: Commercial Managed Care - PPO | Admitting: Physician Assistant

## 2017-07-16 VITALS — BP 110/72 | HR 75 | Ht 64.5 in | Wt 167.6 lb

## 2017-07-16 DIAGNOSIS — E785 Hyperlipidemia, unspecified: Secondary | ICD-10-CM

## 2017-07-16 DIAGNOSIS — Z720 Tobacco use: Secondary | ICD-10-CM | POA: Diagnosis not present

## 2017-07-16 DIAGNOSIS — I214 Non-ST elevation (NSTEMI) myocardial infarction: Secondary | ICD-10-CM | POA: Diagnosis not present

## 2017-07-16 NOTE — Patient Instructions (Signed)
Medication Instructions:  Your physician recommends that you continue on your current medications as directed. Please refer to the Current Medication list given to you today.  Labwork: Your physician recommends that you return for lab work in: 3 months - FASTING (CMET/LIPid) - can come in a few days before next appt with Dr. Gwenlyn Found   Testing/Procedures: none  Follow-Up: Your physician recommends that you schedule a follow-up appointment in: 3 months with Dr. Gwenlyn Found.   Any Other Special Instructions Will Be Listed Below (If Applicable).     If you need a refill on your cardiac medications before your next appointment, please call your pharmacy.

## 2017-07-16 NOTE — Progress Notes (Signed)
Cardiology Office Note   Date:  07/16/2017   ID:  [name], DOB 1966/04/13, MRN [id number]  PCP:  Lynn Knapp, MD  Cardiologist: Dr. Gwenlyn Martin, 07/01/2017 in hospital Lynn Ferries, PA-C   Chief Complaint  Patient presents with  . Hospitalization Follow-up    History of Present Illness: [name] is a 52 y.o. female with a history of sarcoid, uterine fibroids, tobacco use and anemia  Admitted 1/20-1/24/2019 for non-STEMI with DES LAD, hyperlipidemia with LDL goal less than 70, multiple lung nodules, and retroperitoneal femoral artery hematoma complicating cardiac catheterization  [name] presents for cardiology follow up.  She has been feeling a little sluggish since d/c. She has been tired and had some DOE as well. She has not heard from cardiac rehab.  She has not had lower extremity edema, she has not had orthopnea or PND.  She feels depressed because she has been out of work since October 10th.  She has FMLA papers with her today, but [employer] has regulations about when she can go back to work and she is not sure what those are.  She has had some pinching chest pain, not exertional, she will notice it laying down, it will ease off without intervention.   No exertional chest pain or sx that remind her of heart attack.   No palpitations, no presyncope or syncope.  No more problems with right groin site   Past Medical History:  Diagnosis Date  . Anemia   . Fibroid   . NSTEMI (non-ST elevated myocardial infarction) ([name])    s/p DES to LCX 07/02/17  . Sarcoidosis     Past Surgical History:  Procedure Laterality Date  . ABDOMINAL HYSTERECTOMY    . CESAREAN SECTION    . CORONARY/GRAFT ACUTE MI REVASCULARIZATION N/A 06/30/2017   Procedure: Coronary/Graft Acute MI Revascularization;  Surgeon: Lynn Harp, MD;  Location: [name];  Service: Cardiovascular;  Laterality: N/A;  . LEFT HEART CATH AND CORONARY ANGIOGRAPHY N/A 06/30/2017   Procedure: LEFT HEART CATH AND CORONARY ANGIOGRAPHY;  Surgeon: Lynn Harp, MD;  Location: [name];  Service: Cardiovascular;  Laterality: N/A;    Current Outpatient Medications  Medication Sig Dispense Refill  . aspirin 81 MG chewable tablet Chew 1 tablet (81 mg total) by mouth daily.    Marland Kitchen atorvastatin (LIPITOR) 80 MG tablet Take 1 tablet (80 mg total) by mouth daily at 6 PM. 60 tablet 2  . buPROPion (WELLBUTRIN XL) 150 MG 24 hr tablet Take 1 tablet (150 mg total) by mouth daily. X 2 weeks, then 2 tabs a day. Call for refill so higher dose can be sent in 60 tablet 0  . lisinopril (PRINIVIL,ZESTRIL) 2.5 MG tablet Take 1 tablet (2.5 mg total) by mouth daily. 30 tablet 1  . metoprolol tartrate (LOPRESSOR) 25 MG tablet Take 0.5 tablets (12.5 mg total) by mouth 2 (two) times daily. 60 tablet 2  . nitroGLYCERIN (NITROSTAT) 0.4 MG SL tablet Place 1 tablet (0.4 mg total) under the tongue every 5 (five) minutes x 3 doses as needed for chest pain. 25 tablet 0  . ticagrelor (BRILINTA) 90 MG TABS tablet Take 1 tablet (90 mg total) by mouth 2 (two) times daily. 180 tablet 2   No current facility-administered medications for this visit.     Allergies:   Hydrocodone    Social History:  The patient  reports that she has been smoking cigarettes.  She has a  20.00 pack-year smoking history. she has never used smokeless tobacco. She reports that she does not drink alcohol or use drugs.   Family History:  The patient's family history includes Diabetes in her mother; Hyperlipidemia in her father; Hypertension in her father and mother; Stroke (age of onset: 41) in her mother; Stroke (age of onset: 72) in her father.    ROS:  Please see the history of present illness. All other systems are reviewed and negative.    PHYSICAL EXAM: VS:  BP 110/72   Pulse 75   Ht 5' 4.5" (1.638 m)   Wt 167 lb 9.6 oz (76 kg)   SpO2 99%   BMI 28.32 kg/m  , BMI Body mass index is 28.32 kg/m. GEN: Well  nourished, well developed, female in no acute distress  HEENT: normal for age  Neck: no JVD, no carotid bruit, no masses Cardiac: RRR; no murmur, no rubs, or gallops Respiratory:  clear to auscultation bilaterally, normal work of breathing GI: soft, nontender, nondistended, + BS MS: no deformity or atrophy; no edema; distal pulses are 2+ in all 4 extremities   Skin: warm and dry, no rash Neuro:  Strength and sensation are intact Psych: euthymic mood, full affect   EKG:  EKG is ordered today. The ekg ordered today demonstrates SR, HR 64, anterolateral T wave changes c/w recent MI  Cath 06/30/17:  Prox LAD lesion is 70% stenosed.  3rd Mrg lesion is 100% stenosed.  Prox Cx to Mid Cx lesion is 90% stenosed.  A stent was successfully placed.  Post intervention, there is a 0% residual stenosis.  There is mild to moderate left ventricular systolic dysfunction.  LV end diastolic pressure is normal.  The left ventricular ejection fraction is 45-50% by visual estimate. Ms. [name] had an out-of-hospital lateral STEMI probably on Friday with post-MI angina and positive enzymes. Her culprit lesion was a proximal to mid hazy appearing high grade AV groove circumflex.The distal branch was occluded near its termination probably from thromboembolism. She had a excellent endoscopic result from PCI and drug-eluting stenting using a synergy 3 mm x 16 mm long drug-eluting stent. She had preserved LV function with inferoapical wall hypokinesia and an ejection fraction of approximately 45%. She will require standard post MI pharmacology including high-dose statin drug, dual antibiotic therapy including aspirin and Brilenta uninterrupted for 12 months, beta blocker and ACE inhibitor. Cardiac risk factor modification will be reinforced. Intractable continued every 4 hours full dose and then will be discontinued. Femoral sheath will be removed after that and pressure held. She did have a small hematoma at  the end of the case. Post-Intervention Diagram          Echo 07/01/17: Study Conclusions - Left ventricle: The cavity size was normal. Wall thickness was increased in a pattern of mild LVH. Systolic function was normal. The estimated ejection fraction was in the range of 55% to 60%. Wall motion was normal; there were no regional wall motion abnormalities. Left ventricular diastolic function parameters were normal. - Atrial septum: No defect or patent foramen ovale was identified.  Dg Chest 2 View  Result Date: 06/30/2017 CLINICAL DATA:  Chest pain for 2 days EXAM: CHEST  2 VIEW COMPARISON:  03/20/2017 FINDINGS: The heart size and mediastinal contours are within normal limits. Both lungs are clear. The visualized skeletal structures are unremarkable. IMPRESSION: No active cardiopulmonary disease. Electronically Signed   By: Donavan Foil M.D.   On: 06/30/2017 19:47   Dg Abdomen 1  View  Result Date: 07/09/2017 CLINICAL DATA:  Constipation, lower abdominal pain. EXAM: ABDOMEN - 1 VIEW COMPARISON:  None. FINDINGS: The bowel gas pattern is normal. No renal calculi are noted. Phleboliths are noted in the pelvis. No significant stool burden is noted. IMPRESSION: No evidence of bowel obstruction or ileus. Electronically Signed   By: Marijo Conception, M.D.   On: 07/09/2017 13:06   Ct Angio Chest Pe W And/or Wo Contrast  Result Date: 06/30/2017 CLINICAL DATA:  Chest pain for 2 days. Shortness of breath and left arm numbness. Nausea and headache today. History of sarcoidosis and anemia. EXAM: CT ANGIOGRAPHY CHEST WITH CONTRAST TECHNIQUE: Multidetector CT imaging of the chest was performed using the standard protocol during bolus administration of intravenous contrast. Multiplanar CT image reconstructions and MIPs were obtained to evaluate the vascular anatomy. CONTRAST:  170mL ISOVUE-370 IOPAMIDOL (ISOVUE-370) INJECTION 76% COMPARISON:  Chest radiograph 06/30/2017 FINDINGS:  Cardiovascular: Good opacification of the central and segmental pulmonary arteries. No focal filling defects demonstrated. No evidence of significant pulmonary embolus. Normal heart size. Normal caliber thoracic aorta. Mediastinum/Nodes: No significant lymphadenopathy in the chest. A few scattered calcified lymph nodes are present. Esophagus is decompressed. Lungs/Pleura: Mild emphysematous changes mostly in the apex and periphery of the lungs. Mild atelectasis in the lung bases. Calcified granulomas in the right lung base. Dominant nodule in the left upper lung measuring 6 mm diameter. Smaller adjacent nodules are present. There a few scattered nodules in the lungs measuring less than 2 mm diameter. Irregular nodular opacities in the mid lungs along the peribronchial vascular region. Focal cavitation of a nodule in the right middle lung measuring 10 mm diameter. These lesions may be related to the history of sarcoidosis or may be inflammatory but without any comparison available, follow-up in 3 months is suggested. No focal consolidation or airspace disease. Upper Abdomen: Limited visualization of the upper abdominal organs demonstrates no obvious abnormality. Musculoskeletal: No chest wall abnormality. No acute or significant osseous findings. Review of the MIP images confirms the above findings. IMPRESSION: 1. No evidence of significant pulmonary embolus. 2. Multiple pulmonary nodules including a dominant nodule in the left upper lung measuring 6 mm and irregular mid lung nodules bilaterally. Right mid lung nodule measures 10 mm and contains cavitation. Non-contrast chest CT at 3-6 months is recommended. If the nodules are stable at time of repeat CT, then future CT at 18-24 months (from today's scan) is considered optional for low-risk patients, but is recommended for high-risk patients. This recommendation follows the consensus statement: Guidelines for Management of Incidental Pulmonary Nodules Detected on CT  Images: From the Fleischner Society 2017; Radiology 2017; 284:228-243. 3. Mild emphysematous changes in the lungs. Emphysema (ICD10-J43.9). Electronically Signed   By: Lucienne Capers M.D.   On: 06/30/2017 21:29   Ct Abdomen Pelvis W Contrast Result Date: 07/09/2017 CLINICAL DATA:  Acute abdominal pain acute abdominal pain, right groin pain, hematoma at cath site. EXAM: CT ABDOMEN AND PELVIS WITH CONTRAST TECHNIQUE: Multidetector CT imaging of the abdomen and pelvis was performed using the standard protocol following bolus administration of intravenous contrast. CONTRAST:  157mL ISOVUE-300 IOPAMIDOL (ISOVUE-300) INJECTION 61% COMPARISON:  08/31/2015 FINDINGS: Lower chest: Lung bases are clear. No effusions. Heart is normal size. Hepatobiliary: Lesion within the right hepatic lobe measures 11 mm with enhancement pattern most compatible with hemangioma. Gallbladder unremarkable. Pancreas: No focal abnormality or ductal dilatation. Spleen: No focal abnormality.  Normal size. Adrenals/Urinary Tract: The kidneys are unremarkable. No hydronephrosis. Adrenal glands are  normal. There is mild diffuse wall thickening within the urinary bladder, question cystitis. Stomach/Bowel: Sigmoid and descending colonic diverticulosis. No active diverticulitis. Appendix is normal. Stomach and small bowel decompressed, unremarkable. Vascular/Lymphatic: No evidence of aneurysm or adenopathy. Reproductive: Prior hysterectomy.  No adnexal masses. Other: No free fluid or free air. There is fluid and stranding noted within the right retroperitoneum anterior to the iliopsoas muscle adjacent to the right iliac vessels compatible with retroperitoneal hematoma. Largest component is seen on image 68 and measures 4.7 x 1.4 cm. No hematoma within the right groin. Musculoskeletal: No acute bony abnormality. IMPRESSION: Small retroperitoneal hematoma in the right lower pelvis along the right iliopsoas muscle and extending along the right pelvic  sidewall in the right lower pelvis. Left colonic diverticulosis. Small hemangioma in the liver. Mild diffuse wall thickening of the urinary bladder. Question cystitis. Recommend clinical correlation. Electronically Signed   By: Rolm Baptise M.D.   On: 07/09/2017 18:58   Vas [place] Lower Extremity Arterial Duplex Result Date: 07/09/2017 LOWER EXTREMITY ARTERIAL DUPLEX STUDY Indications: Groin pain.  Current ABI: N/A  Examination Guidelines: A complete evaluation includes B-mode imaging, spectral doppler, color doppler, and power doppler as needed of all accessible portions of each vessel. Bilateral testing is considered an integral part of a complete examination. Limited examinations for reoccurring indications may be performed as noted.  Right Duplex Findings: +--------+--------+-----+--------+---------+--------+         PSV cm/sRatioStenosisWaveform Comments +--------+--------+-----+--------+---------+--------+ CFA Prox                     triphasic         +--------+--------+-----+--------+---------+--------+ Can compare to previous study 07/02/17.  Final Interpretation: Right: No evidence of pseudoaneurysm, hematoma, AVF, or DVT. *See table(s) above for measurements and observations.  Electronically signed by Deitra Mayo on 07/09/2017 at 4:01:33 PM.  Final   Vas [place] Lower Extremity Arterial Duplex Result Date: 07/02/2017 LOWER EXTREMITY ARTERIAL DUPLEX STUDY Indications: Groin pain post cath.  Current ABI: NA  Examination Guidelines: A complete evaluation includes B-mode imaging, spectral doppler, color doppler, and power doppler as needed of all accessible portions of each vessel. Bilateral testing is considered an integral part of a complete examination. Limited examinations for reoccurring indications may be performed as noted.  Right Duplex Findings: +--------+--------+-----+--------+---------+--------+         PSV cm/sRatioStenosisWaveform Comments  +--------+--------+-----+--------+---------+--------+ CFA Prox                     triphasic         +--------+--------+-----+--------+---------+--------+  Final Interpretation: Right: No evidence of pseudoaneurym or hematoma. *See table(s) above for measurements and observations.  Electronically signed by Adele Barthel on 07/02/2017 at 5:01:41 PM.   Final  2   Recent Labs: 06/30/2017: TSH 1.073 07/09/2017: ALT 43; BUN 9; Creatinine, Ser 0.80; Hemoglobin 12.3; Platelets 420; Potassium 4.2; Sodium 138    Lipid Panel    Component Value Date/Time   CHOL 136 07/01/2017 0326   TRIG 87 07/01/2017 0326   HDL 33 (L) 07/01/2017 0326   CHOLHDL 4.1 07/01/2017 0326   VLDL 17 07/01/2017 0326   LDLCALC 86 07/01/2017 0326     Wt Readings from Last 3 Encounters:  07/16/17 167 lb 9.6 oz (76 kg)  07/09/17 162 lb (73.5 kg)  07/06/17 166 lb 12.8 oz (75.7 kg)     Other studies Reviewed: Additional studies/ records that were reviewed today include: Hospital records and testing.  ASSESSMENT AND PLAN:  1.  NSTEMI, subsequent episode of CARE: She is not having any symptoms reminiscent of her MI.  She is on good therapy with aspirin, Brilinta, high-dose Lipitor and a low dose of lisinopril and metoprolol.  She has no history of hypertension and her blood pressures too low to increase the ACE inhibitor or beta-blocker.  Also, she is complaining of fatigue and I am reluctant to increase the medications that may be contributing to this feeling.  She is experiencing some situational depression.  We will contact cardiac rehab and make sure the referral has been made.  She is encouraged to attend this.  She is encouraged to talk to her PCP or friends/counselors to help her work through this.  If symptoms do not improve, talk to her PCP about medication.  She is already on Wellbutrin  2.  Dyslipidemia: I explained that the best way to get her HDL up his exercise.  I explained that before we knew she had coronary  artery disease, her cholesterol profile was okay but now the LDL needs to be lower.  Continue Lipitor 80, get a complete metabolic profile and lipid profile in 3 months  3.  Tobacco use: Importance of quitting was discussed, continue Wellbutrin  4.  Pulmonary nodules: She has a history of tobacco use, recheck CT without contrast in 3 months.  Cardiology to arrange if it has not been done by her PCP when she sees Dr. Gwenlyn Martin.   Current medicines are reviewed at length with the patient today.  The patient does not have concerns regarding medicines.  The following changes have been made:  no change  Labs/ tests ordered today include:   Orders Placed This Encounter  Procedures  . Lipid panel  . Comprehensive metabolic panel  . EKG 12-Lead     Disposition:   FU with Dr. Gwenlyn Martin  Signed, Lynn Ferries, PA-C  07/16/2017 10:11 AM    [place] Phone: [phone]; Fax: [phone]  This note was written with the assistance of speech recognition software. Please excuse any transcriptional errors.

## 2017-07-17 ENCOUNTER — Telehealth (HOSPITAL_COMMUNITY): Payer: Self-pay

## 2017-07-17 NOTE — Telephone Encounter (Signed)
Called to speak with patient about Cardiac Rehab - Patient is interested in the program. Scheduled orientation on 09/03/2017 at 8:45am. Patient will attend the 1:15pm exc class.

## 2017-08-19 ENCOUNTER — Ambulatory Visit: Payer: Commercial Managed Care - PPO | Admitting: Family Medicine

## 2017-08-25 ENCOUNTER — Other Ambulatory Visit: Payer: Self-pay | Admitting: Family Medicine

## 2017-08-27 ENCOUNTER — Telehealth (HOSPITAL_COMMUNITY): Payer: Self-pay | Admitting: Pharmacist

## 2017-08-29 ENCOUNTER — Telehealth: Payer: Self-pay | Admitting: Cardiovascular Disease

## 2017-08-29 NOTE — Telephone Encounter (Signed)
New Message   Patient wants to know why [name] gave the ok for her to go back to work on Monday due to heart attack, when he hasn't seen her to be evaluated. Please call

## 2017-08-29 NOTE — Telephone Encounter (Signed)
Returned the call to the patient. She stated that she was informed that she has been approved to go back to work on Monday 3/25.  She stated that she does not feel like she is ready and that she starts cardiac rehab on 3/26. Message has been routed to the provider for his knowledge and recommendation.

## 2017-08-30 ENCOUNTER — Encounter: Payer: Self-pay | Admitting: Cardiovascular Disease

## 2017-08-30 NOTE — Telephone Encounter (Signed)
Spoke to pt. Pt c/o still having chest pain and SOB on exertion. 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 Pt start cardiac rehab on 3/26.  Pt also stated she wanted to wait until after she saw Dr. Gwenlyn Found in the office before going back to work. She stated she is scared because all of this is new to her and she is not feeling well.   Scheduled sooner appt for pt to discuss with Dr. Gwenlyn Found on 3/26 after cardiac rehab. Pt verbalized thanks.

## 2017-09-01 NOTE — Telephone Encounter (Signed)
Cardiac Rehab - Pharmacy Resident Documentation   Patient unable to be reached after three call attempts. Please complete allergy verification and medication review during patient's cardiac rehab appointment.   Jalene Mullet, Pharm.D. PGY1 Pharmacy Resident 09/01/2017 2:55 PM Main Pharmacy: [phone]

## 2017-09-02 ENCOUNTER — Telehealth (HOSPITAL_COMMUNITY): Payer: Self-pay | Admitting: *Deleted

## 2017-09-02 NOTE — Telephone Encounter (Signed)
-----   Message from Lorretta Harp, MD sent at 09/02/2017 10:12 AM EDT -----  That is fine with me ----- Message ----- From: Rowe Pavy, RN Sent: 09/01/2017   3:14 PM To: Lorretta Harp, MD  Dr. Gwenlyn Found,  Please review telephone note from Nelta Numbers.  Are you okay with pt attending  Cardiac rehab orientation the morning of 3/26 then seeing you in the afternoon.  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 Note: orientation consist of 6 minute walk test, Vital Signs, measurements, goals, nutritonal goals and overview on what to expect during exercise. Please share your input  Thanks Maurice Small RN, BSN Cardiac and Pulmonary Rehab Nurse Navigator

## 2017-09-03 ENCOUNTER — Encounter: Payer: Self-pay | Admitting: Cardiovascular Disease

## 2017-09-03 ENCOUNTER — Encounter (HOSPITAL_COMMUNITY)
Admission: RE | Admit: 2017-09-03 | Discharge: 2017-09-03 | Disposition: A | Discharge status: Home / Self Care | Payer: Commercial Managed Care - PPO | Source: Ambulatory Visit | Attending: Cardiovascular Disease | Admitting: Cardiovascular Disease

## 2017-09-03 ENCOUNTER — Ambulatory Visit (INDEPENDENT_AMBULATORY_CARE_PROVIDER_SITE_OTHER): Payer: Commercial Managed Care - PPO | Admitting: Cardiovascular Disease

## 2017-09-03 ENCOUNTER — Encounter (HOSPITAL_COMMUNITY): Payer: Self-pay

## 2017-09-03 VITALS — BP 129/87 | HR 76 | Ht 64.0 in | Wt 170.0 lb

## 2017-09-03 VITALS — Ht 64.5 in | Wt 168.4 lb

## 2017-09-03 DIAGNOSIS — Z720 Tobacco use: Secondary | ICD-10-CM

## 2017-09-03 DIAGNOSIS — F17210 Nicotine dependence, cigarettes, uncomplicated: Secondary | ICD-10-CM | POA: Insufficient documentation

## 2017-09-03 DIAGNOSIS — Z955 Presence of coronary angioplasty implant and graft: Secondary | ICD-10-CM | POA: Insufficient documentation

## 2017-09-03 DIAGNOSIS — I214 Non-ST elevation (NSTEMI) myocardial infarction: Secondary | ICD-10-CM

## 2017-09-03 DIAGNOSIS — I2129 ST elevation (STEMI) myocardial infarction involving other sites: Secondary | ICD-10-CM | POA: Insufficient documentation

## 2017-09-03 DIAGNOSIS — I213 ST elevation (STEMI) myocardial infarction of unspecified site: Secondary | ICD-10-CM

## 2017-09-03 DIAGNOSIS — Z79899 Other long term (current) drug therapy: Secondary | ICD-10-CM | POA: Insufficient documentation

## 2017-09-03 DIAGNOSIS — E785 Hyperlipidemia, unspecified: Secondary | ICD-10-CM | POA: Diagnosis not present

## 2017-09-03 NOTE — Assessment & Plan Note (Signed)
History of non-STEMI status post PCI and drug-eluting stenting of the mid AV circumflex by myself 06/30/17. She did have a 70% proximal LAD lesion which was not addressed. 2-D echo was normal. She had pain in her right groin and had several Doppler studies are ruled out pseudoaneurysm or AV fistula She does complain of atypical chest pain that continues and is constant.she remains on dual antiplatelet therapy with Brilenta and aspirin.

## 2017-09-03 NOTE — Assessment & Plan Note (Signed)
History of ongoing tobacco abuse having smoked one pack per day and now reducing to 4 cigarettes a day.

## 2017-09-03 NOTE — Telephone Encounter (Signed)
I saw her in the office today

## 2017-09-03 NOTE — Progress Notes (Signed)
[name] 52 y.o. female DOB: Aug 31, 1965 MRN: [id number]      Nutrition Note  1. ST elevation myocardial infarction (STEMI), unspecified artery ([place])   2. Stented coronary artery    Past Medical History:  Diagnosis Date  . Anemia   . Fibroid   . NSTEMI (non-ST elevated myocardial infarction) ([place])    s/p DES to LCX 07/02/17  . Sarcoidosis    Meds reviewed.  HT: Ht Readings from Last 1 Encounters:  09/03/17 5\' 4"  (1.626 m)    WT: Wt Readings from Last 3 Encounters:  09/03/17 170 lb (77.1 kg)  09/03/17 168 lb 6.9 oz (76.4 kg)  07/16/17 167 lb 9.6 oz (76 kg)     BMI 29.2   Current tobacco use? Yes       Labs:  Lipid Panel     Component Value Date/Time   CHOL 136 07/01/2017 0326   TRIG 87 07/01/2017 0326   HDL 33 (L) 07/01/2017 0326   CHOLHDL 4.1 07/01/2017 0326   VLDL 17 07/01/2017 0326   LDLCALC 86 07/01/2017 0326    Lab Results  Component Value Date   HGBA1C 5.8 (H) 07/02/2017   CBG (last 3)  No results for input(s): GLUCAP in the last 72 hours.  Nutrition Note Spoke with pt. Nutrition plan and goals reviewed with pt. Pt is following Step 1 of the Therapeutic Lifestyle Changes diet. Pt diet quality is poor due to food insecurity. Per discussion, pt choosing processed, inexpensive foods due to being out of work. Pt does not currently receive food stamps. Per discussion, pt wants to lose wt. Wt loss tips reviewed.  Pt noted to be pre-diabetic according to her last A1c. Pt expressed understanding of the information reviewed. Pt aware of nutrition education classes offered.  Nutrition Diagnosis ? Food-and nutrition-related knowledge deficit related to lack of exposure to information as related to diagnosis of: ? CVD ? Pre-DM ? Overweight related to excessive energy intake as evidenced by a BMI of 29.2  Nutrition Intervention ? Pt's individual nutrition plan and goals reviewed with pt. ? Pt given handouts for: ? "[employer] [facility]  [place]"  Nutrition Goal(s):  ? Pt to identify and limit food sources of saturated fat, trans fat, and sodium ? Pt to identify food quantities necessary to achieve weight loss of 6-24 lb (2.7-10.9 kg) at graduation from cardiac rehab. Goal wt of 145 lb desired.   Plan:  Pt to attend nutrition classes ? Nutrition I ? Nutrition II ? Portion Distortion  Will provide client-centered nutrition education as part of interdisciplinary care.   Monitor and evaluate progress toward nutrition goal with team.  Derek Mound, M.Ed, RD, LDN, CDE 09/03/2017 3:29 PM

## 2017-09-03 NOTE — Progress Notes (Signed)
Cardiac Individual Treatment Plan  Patient Details  Name: [name] MRN: [id number] Date of Birth: 12/11/1965 Referring Provider:   Flowsheet Row CARDIAC REHAB PHASE II ORIENTATION from 09/03/2017 in [facility] [place]  Referring Provider  Shelva Majestic MD      Initial Encounter Date:  [place] PHASE II ORIENTATION from 09/03/2017 in [place]  Date  09/03/17  Referring Provider  Shelva Majestic MD      Visit Diagnosis: ST elevation myocardial infarction (STEMI), unspecified artery ([place])  Stented coronary artery  Patient's Home Medications on Admission:  Current Outpatient Medications:  .  atorvastatin (LIPITOR) 80 MG tablet, Take 1 tablet (80 mg total) by mouth daily at 6 PM., Disp: 60 tablet, Rfl: 2 .  lisinopril (PRINIVIL,ZESTRIL) 2.5 MG tablet, TAKE 1 TABLET BY MOUTH EVERY DAY, Disp: 30 tablet, Rfl: 0 .  metoprolol tartrate (LOPRESSOR) 25 MG tablet, Take 0.5 tablets (12.5 mg total) by mouth 2 (two) times daily., Disp: 60 tablet, Rfl: 2 .  nitroGLYCERIN (NITROSTAT) 0.4 MG SL tablet, Place 1 tablet (0.4 mg total) under the tongue every 5 (five) minutes x 3 doses as needed for chest pain., Disp: 25 tablet, Rfl: 0 .  ticagrelor (BRILINTA) 90 MG TABS tablet, Take 1 tablet (90 mg total) by mouth 2 (two) times daily., Disp: 180 tablet, Rfl: 2 .  aspirin 81 MG chewable tablet, Chew 1 tablet (81 mg total) by mouth daily. (Patient not taking: Reported on 09/03/2017), Disp: , Rfl:  .  buPROPion (WELLBUTRIN XL) 150 MG 24 hr tablet, Take 1 tablet (150 mg total) by mouth daily. X 2 weeks, then 2 tabs a day. Call for refill so higher dose can be sent in (Patient not taking: Reported on 09/03/2017), Disp: 60 tablet, Rfl: 0  Past Medical History: Past Medical History:  Diagnosis Date  . Anemia   . Fibroid   . NSTEMI (non-ST elevated myocardial infarction) ([place])    s/p DES to LCX 07/02/17  . Sarcoidosis      Tobacco Use: Social History   Tobacco Use  Smoking Status Current Every Day Smoker  . Packs/day: 1.00  . Years: 20.00  . Pack years: 20.00  . Types: Cigarettes  Smokeless Tobacco Never Used    Labs: Recent Review Flowsheet Data    Labs for ITP Cardiac and Pulmonary Rehab Latest Ref Rng & Units 06/24/2013 04/07/2015 06/30/2017 07/01/2017 07/02/2017   Cholestrol 0 - 200 mg/dL - 172 - 136 -   LDLCALC 0 - 99 mg/dL - 118 - 86 -   HDL >40 mg/dL - 33(L) - 33(L) -   Trlycerides <150 mg/dL - 103 - 87 -   Hemoglobin A1c 4.8 - 5.6 % - - 5.7(H) - 5.8(H)   TCO2 0 - 100 mmol/L 24 - - - -      Capillary Blood Glucose: Lab Results  Component Value Date   GLUCAP 74 01/28/2015   GLUCAP 93 12/19/2008     Exercise Target Goals: Date: 09/03/17  Exercise Program Goal: Individual exercise prescription set using results from initial 6 min walk test and THRR while considering  patient's activity barriers and safety.   Exercise Prescription Goal: Initial exercise prescription builds to 30-45 minutes a day of aerobic activity, 2-3 days per week.  Home exercise guidelines will be given to patient during program as part of exercise prescription that the participant will acknowledge.  Activity Barriers & Risk Stratification: Activity Barriers & Cardiac Risk  Stratification - 09/03/17 1225    Activity Barriers & Cardiac Risk Stratification          Activity Barriers  Arthritis;Other (comment);Deconditioning;Muscular Weakness;Chest Pain/Angina    Comments  B knee discomfort    Cardiac Risk Stratification  High           6 Minute Walk: 6 Minute Walk    6 Minute Walk    Row Name 09/03/17 1225 09/03/17 1238   Phase  Initial  no documentation   Distance  no documentation  1556 feet   Walk Time  no documentation  6 minutes   # of Rest Breaks  no documentation  0   MPH  no documentation  2.9   METS  no documentation  4.3   RPE  no documentation  13   VO2 Peak  no documentation  15.3    Symptoms  no documentation  Yes (comment)   Comments  no documentation  c/o 6/10 chest achiness at end of walk test   Resting HR  no documentation  70 bpm   Resting BP  no documentation  100/60   Resting Oxygen Saturation   no documentation  100 %   Exercise Oxygen Saturation  during 6 min walk  no documentation  100 %   Max Ex. HR  no documentation  118 bpm   Max Ex. BP  no documentation  122/80   2 Minute Post BP  no documentation  118/72          Oxygen Initial Assessment:   Oxygen Re-Evaluation:   Oxygen Discharge (Final Oxygen Re-Evaluation):   Initial Exercise Prescription: Initial Exercise Prescription - 09/03/17 1200    Date of Initial Exercise RX and Referring Provider          Date  09/03/17    Referring Provider  Shelva Majestic MD        Treadmill          MPH  2.9    Grade  1    Minutes  10    METs  3.62        Bike          Level  0.9    Minutes  10    METs  3.26        NuStep          Level  3    SPM  80    Minutes  10    METs  2.5        Prescription Details          Frequency (times per week)  3    Duration  Progress to 30 minutes of continuous aerobic without signs/symptoms of physical distress        Intensity          Ratings of Perceived Exertion  11-13    Perceived Dyspnea  0-4        Progression          Progression  Continue to progress workloads to maintain intensity without signs/symptoms of physical distress.        Resistance Training          Training Prescription  Yes    Weight  3lbs    Reps  10-15           Perform Capillary Blood Glucose checks as needed.  Exercise Prescription Changes:   Exercise Comments:   Exercise Goals and Review: Exercise Goals    Exercise Goals  [place] Name 09/03/17 1239   Increase Physical Activity  Yes   Intervention  Provide advice, education, support and counseling about physical activity/exercise needs.;Develop an individualized exercise prescription for aerobic and  resistive training based on initial evaluation findings, risk stratification, comorbidities and participant's personal goals.   Expected Outcomes  Short Term: Attend rehab on a regular basis to increase amount of physical activity.;Long Term: Add in home exercise to make exercise part of routine and to increase amount of physical activity.;Long Term: Exercising regularly at least 3-5 days a week.   Increase Strength and Stamina  Yes   Intervention  Provide advice, education, support and counseling about physical activity/exercise needs.;Develop an individualized exercise prescription for aerobic and resistive training based on initial evaluation findings, risk stratification, comorbidities and participant's personal goals.   Expected Outcomes  Short Term: Increase workloads from initial exercise prescription for resistance, speed, and METs.;Short Term: Perform resistance training exercises routinely during rehab and add in resistance training at home;Long Term: Improve cardiorespiratory fitness, muscular endurance and strength as measured by increased METs and functional capacity (6MWT)   Able to understand and use rate of perceived exertion (RPE) scale  Yes   Intervention  Provide education and explanation on how to use RPE scale   Expected Outcomes  Short Term: Able to use RPE daily in rehab to express subjective intensity level;Long Term:  Able to use RPE to guide intensity level when exercising independently   Knowledge and understanding of Target Heart Rate Range (THRR)  Yes   Intervention  Provide education and explanation of THRR including how the numbers were predicted and where they are located for reference   Expected Outcomes  Short Term: Able to state/look up THRR;Long Term: Able to use THRR to govern intensity when exercising independently;Short Term: Able to use daily as guideline for intensity in rehab   Able to check pulse independently  Yes   Intervention  Provide education and  demonstration on how to check pulse in carotid and radial arteries.;Review the importance of being able to check your own pulse for safety during independent exercise   Expected Outcomes  Short Term: Able to explain why pulse checking is important during independent exercise;Long Term: Able to check pulse independently and accurately   Understanding of Exercise Prescription  Yes   Intervention  Provide education, explanation, and written materials on patient's individual exercise prescription   Expected Outcomes  Short Term: Able to explain program exercise prescription;Long Term: Able to explain home exercise prescription to exercise independently          Exercise Goals Re-Evaluation :    Discharge Exercise Prescription (Final Exercise Prescription Changes):   Nutrition:  Target Goals: Understanding of nutrition guidelines, daily intake of sodium 1500mg , cholesterol 200mg , calories 30% from fat and 7% or less from saturated fats, daily to have 5 or more servings of fruits and vegetables.  Biometrics: Pre Biometrics - 09/03/17 1240    Pre Biometrics          Height  5' 4.5" (1.638 m)    Weight  168 lb 6.9 oz (76.4 kg)    Waist Circumference  37 inches    Hip Circumference  43.5 inches    Waist to Hip Ratio  0.85 %    BMI (Calculated)  28.48    Triceps Skinfold  39 mm    % Body Fat  40.6 %    Grip Strength  25 kg    Flexibility  16.5 in  Single Leg Stand  30 seconds            Nutrition Therapy Plan and Nutrition Goals:   Nutrition Assessments:   Nutrition Goals Re-Evaluation:   Nutrition Goals Re-Evaluation:   Nutrition Goals Discharge (Final Nutrition Goals Re-Evaluation):   Psychosocial: Target Goals: Acknowledge presence or absence of significant depression and/or stress, maximize coping skills, provide positive support system. Participant is able to verbalize types and ability to use techniques and skills needed for reducing stress and  depression.  Initial Review & Psychosocial Screening: Initial Psych Review & Screening - 09/03/17 1033    Initial Review          Current issues with  Current Depression;Current Stress Concerns;Current Anxiety/Panic    Source of Stress Concerns  Chronic Illness;Unable to participate in former interests or hobbies;Occupation;Financial    Comments  pt voices concern about her health, health care and ability to return to work. pt with food disparities at home.         Family Dynamics          Good Support System?  No    Concerns  No support system        Barriers          Psychosocial barriers to participate in program  There are no identifiable barriers or psychosocial needs.        Screening Interventions          Interventions  Encouraged to exercise;Program counselor consult;To provide support and resources with identified psychosocial needs;Provide feedback about the scores to participant    Expected Outcomes  Short Term goal: Utilizing psychosocial counselor, staff and physician to assist with identification of specific Stressors or current issues interfering with healing process. Setting desired goal for each stressor or current issue identified.;Long Term Goal: Stressors or current issues are controlled or eliminated.;Short Term goal: Identification and review with participant of any Quality of Life or Depression concerns found by scoring the questionnaire.;Long Term goal: The participant improves quality of Life and PHQ9 Scores as seen by post scores and/or verbalization of changes           Quality of Life Scores: Quality of Life - 09/03/17 1112    Quality of Life Scores          Health/Function Pre  10.9 %    Socioeconomic Pre  20.13 %    Psych/Spiritual Pre  24 %    Family Pre  11.6 %    GLOBAL Pre  15.73 %          Scores of 19 and below usually indicate a poorer quality of life in these areas.  A difference of  2-3 points is a clinically meaningful difference.   A difference of 2-3 points in the total score of the Quality of Life Index has been associated with significant improvement in overall quality of life, self-image, physical symptoms, and general health in studies assessing change in quality of life.  PHQ-9: Recent Review Flowsheet Data    Depression screen [facility] 2/9 07/06/2017 04/24/2017 03/08/2017 12/14/2016 10/31/2016   Decreased Interest 0 0 0 0 0   Down, Depressed, Hopeless 0 0 0 0 0   PHQ - 2 Score 0 0 0 0 0     Interpretation of Total Score  Total Score Depression Severity:  1-4 = Minimal depression, 5-9 = Mild depression, 10-14 = Moderate depression, 15-19 = Moderately severe depression, 20-27 = Severe depression   Psychosocial Evaluation and Intervention:  Psychosocial Re-Evaluation:   Psychosocial Discharge (Final Psychosocial Re-Evaluation):   Vocational Rehabilitation: Provide vocational rehab assistance to qualifying candidates.   Vocational Rehab Evaluation & Intervention: Vocational Rehab - 09/03/17 0926    Initial Vocational Rehab Evaluation & Intervention          Assessment shows need for Vocational Rehabilitation  Yes    Vocational Rehab Packet given to patient  09/03/17           Education: Education Goals: Education classes will be provided on a weekly basis, covering required topics. Participant will state understanding/return demonstration of topics presented.  Learning Barriers/Preferences: Learning Barriers/Preferences - 09/03/17 1225    Learning Barriers/Preferences          Learning Preferences  Skilled Demonstration;Written Material;Verbal Instruction;Video;Individual Instruction           Education Topics: Count Your Pulse:  -Group instruction provided by verbal instruction, demonstration, patient participation and written materials to support subject.  Instructors address importance of being able to find your pulse and how to count your pulse when at home without a heart monitor.  Patients  get hands on experience counting their pulse with staff help and individually.   Heart Attack, Angina, and Risk Factor Modification:  -Group instruction provided by verbal instruction, video, and written materials to support subject.  Instructors address signs and symptoms of angina and heart attacks.    Also discuss risk factors for heart disease and how to make changes to improve heart health risk factors.   Functional Fitness:  -Group instruction provided by verbal instruction, demonstration, patient participation, and written materials to support subject.  Instructors address safety measures for doing things around the house.  Discuss how to get up and down off the floor, how to pick things up properly, how to safely get out of a chair without assistance, and balance training.   Meditation and Mindfulness:  -Group instruction provided by verbal instruction, patient participation, and written materials to support subject.  Instructor addresses importance of mindfulness and meditation practice to help reduce stress and improve awareness.  Instructor also leads participants through a meditation exercise.    Stretching for Flexibility and Mobility:  -Group instruction provided by verbal instruction, patient participation, and written materials to support subject.  Instructors lead participants through series of stretches that are designed to increase flexibility thus improving mobility.  These stretches are additional exercise for major muscle groups that are typically performed during regular warm up and cool down.   Hands Only CPR:  -Group verbal, video, and participation provides a basic overview of AHA guidelines for community CPR. Role-play of emergencies allow participants the opportunity to practice calling for help and chest compression technique with discussion of AED use.   Hypertension: -Group verbal and written instruction that provides a basic overview of hypertension including  the most recent diagnostic guidelines, risk factor reduction with self-care instructions and medication management.    Nutrition I class: Heart Healthy Eating:  -Group instruction provided by PowerPoint slides, verbal discussion, and written materials to support subject matter. The instructor gives an explanation and review of the Therapeutic Lifestyle Changes diet recommendations, which includes a discussion on lipid goals, dietary fat, sodium, fiber, plant stanol/sterol esters, sugar, and the components of a well-balanced, healthy diet.   Nutrition II class: Lifestyle Skills:  -Group instruction provided by PowerPoint slides, verbal discussion, and written materials to support subject matter. The instructor gives an explanation and review of label reading, grocery shopping for heart health, heart healthy recipe  modifications, and ways to make healthier choices when eating out.   Diabetes Question & Answer:  -Group instruction provided by PowerPoint slides, verbal discussion, and written materials to support subject matter. The instructor gives an explanation and review of diabetes co-morbidities, pre- and post-prandial blood glucose goals, pre-exercise blood glucose goals, signs, symptoms, and treatment of hypoglycemia and hyperglycemia, and foot care basics.   Diabetes Blitz:  -Group instruction provided by PowerPoint slides, verbal discussion, and written materials to support subject matter. The instructor gives an explanation and review of the physiology behind type 1 and type 2 diabetes, diabetes medications and rational behind using different medications, pre- and post-prandial blood glucose recommendations and Hemoglobin A1c goals, diabetes diet, and exercise including blood glucose guidelines for exercising safely.    Portion Distortion:  -Group instruction provided by PowerPoint slides, verbal discussion, written materials, and food models to support subject matter. The instructor  gives an explanation of serving size versus portion size, changes in portions sizes over the last 20 years, and what consists of a serving from each food group.   Stress Management:  -Group instruction provided by verbal instruction, video, and written materials to support subject matter.  Instructors review role of stress in heart disease and how to cope with stress positively.     Exercising on Your Own:  -Group instruction provided by verbal instruction, power point, and written materials to support subject.  Instructors discuss benefits of exercise, components of exercise, frequency and intensity of exercise, and end points for exercise.  Also discuss use of nitroglycerin and activating EMS.  Review options of places to exercise outside of rehab.  Review guidelines for sex with heart disease.   Cardiac Drugs I:  -Group instruction provided by verbal instruction and written materials to support subject.  Instructor reviews cardiac drug classes: antiplatelets, anticoagulants, beta blockers, and statins.  Instructor discusses reasons, side effects, and lifestyle considerations for each drug class.   Cardiac Drugs II:  -Group instruction provided by verbal instruction and written materials to support subject.  Instructor reviews cardiac drug classes: angiotensin converting enzyme inhibitors (ACE-I), angiotensin II receptor blockers (ARBs), nitrates, and calcium channel blockers.  Instructor discusses reasons, side effects, and lifestyle considerations for each drug class.   Anatomy and Physiology of the Circulatory System:  Group verbal and written instruction and models provide basic cardiac anatomy and physiology, with the coronary electrical and arterial systems. Review of: AMI, Angina, Valve disease, Heart Failure, Peripheral Artery Disease, Cardiac Arrhythmia, Pacemakers, and the ICD.   Other Education:  -Group or individual verbal, written, or video instructions that support the  educational goals of the cardiac rehab program.   Holiday Eating Survival Tips:  -Group instruction provided by PowerPoint slides, verbal discussion, and written materials to support subject matter. The instructor gives patients tips, tricks, and techniques to help them not only survive but enjoy the holidays despite the onslaught of food that accompanies the holidays.   Knowledge Questionnaire Score: Knowledge Questionnaire Score - 09/03/17 0926    Knowledge Questionnaire Score          Pre Score  24/28           Core Components/Risk Factors/Patient Goals at Admission: Personal Goals and Risk Factors at Admission - 09/03/17 1242    Core Components/Risk Factors/Patient Goals on Admission           Weight Management  Yes;Weight Maintenance;Weight Loss    Intervention  Weight Management: Develop a combined nutrition and exercise program designed to  reach desired caloric intake, while maintaining appropriate intake of nutrient and fiber, sodium and fats, and appropriate energy expenditure required for the weight goal.;Weight Management: Provide education and appropriate resources to help participant work on and attain dietary goals.;Weight Management/Obesity: Establish reasonable short term and long term weight goals.    Admit Weight  168 lb 6.9 oz (76.4 kg)    Goal Weight: Short Term  160 lb (72.6 kg)    Goal Weight: Long Term  145 lb (65.8 kg)    Expected Outcomes  Short Term: Continue to assess and modify interventions until short term weight is achieved;Long Term: Adherence to nutrition and physical activity/exercise program aimed toward attainment of established weight goal;Weight Maintenance: Understanding of the daily nutrition guidelines, which includes 25-35% calories from fat, 7% or less cal from saturated fats, less than 200mg  cholesterol, less than 1.5gm of sodium, & 5 or more servings of fruits and vegetables daily;Weight Loss: Understanding of general recommendations for a  balanced deficit meal plan, which promotes 1-2 lb weight loss per week and includes a negative energy balance of [phone] kcal/d;Understanding recommendations for meals to include 15-35% energy as protein, 25-35% energy from fat, 35-60% energy from carbohydrates, less than 200mg  of dietary cholesterol, 20-35 gm of total fiber daily;Understanding of distribution of calorie intake throughout the day with the consumption of 4-5 meals/snacks           Core Components/Risk Factors/Patient Goals Review:    Core Components/Risk Factors/Patient Goals at Discharge (Final Review):    ITP Comments: ITP Comments    Row Name 09/03/17 0925   ITP Comments  Dr. Fransico Him, Medical Director       Comments: Patient attended orientation from 0901 to 1129  to review rules and guidelines for program. Completed 6 minute walk test, Intitial ITP, and exercise prescription.  VSS. Telemetry-sinus rhythm, non specific ST changes/TWI.   Asymptomatic. Andi Hence, RN, BSN Cardiac Pulmonary Rehab 09/03/17  12:50 PM

## 2017-09-03 NOTE — Assessment & Plan Note (Signed)
History of hyperlipidemia and high-dose statin therapy. We will recheck a lipid and liver profile.

## 2017-09-03 NOTE — Progress Notes (Signed)
09/03/2017 [name]   27-Aug-1965  [id number]  Primary Physician Shawnee Knapp, MD Primary Cardiologist: Lorretta Harp MD [name], [place]  HPI:  [name] is a 52 y.o. 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 She is being seen by me for a post hospital visit after a non-STEMI and occurred on 06/30/17 status post PCI and drug-eluting stenting of the proximal AV groove circumflex. She did have a 70% proximal LAD lesion which did I did not think was physiologically significant. 2-D echo performed 07/01/17 showed normal LV function. She does have a history of sarcoidosisas well. She has long history of tobacco abuse having smoked a pack a day for 30 years currently smoking 4 cigarettes a day. She continues to have atypical constant chest pain. She also appears somewhat depressed on Wellbutrin. She has not worked in months and is currently starting cardiac rehabilitation.    Current Meds  Medication Sig  . buPROPion (WELLBUTRIN XL) 150 MG 24 hr tablet Take 1 tablet (150 mg total) by mouth daily. X 2 weeks, then 2 tabs a day. Call for refill so higher dose can be sent in  . lisinopril (PRINIVIL,ZESTRIL) 2.5 MG tablet TAKE 1 TABLET BY MOUTH EVERY DAY  . metoprolol tartrate (LOPRESSOR) 25 MG tablet Take 0.5 tablets (12.5 mg total) by mouth 2 (two) times daily.  . nitroGLYCERIN (NITROSTAT) 0.4 MG SL tablet Place 1 tablet (0.4 mg total) under the tongue every 5 (five) minutes x 3 doses as needed for chest pain.  . ticagrelor (BRILINTA) 90 MG TABS tablet Take 1 tablet (90 mg total) by mouth 2 (two) times daily.  . [DISCONTINUED] aspirin 81 MG chewable tablet Chew 1 tablet (81 mg total) by mouth daily.  . [DISCONTINUED] atorvastatin (LIPITOR) 80 MG tablet Take 1 tablet (80 mg total) by mouth daily at 6 PM.     Allergies  Allergen Reactions  . Hydrocodone Hives and Itching    Social  History   Socioeconomic History  . Marital status: Single    Spouse name: Not on file  . Number of children: 2  . Years of education: Not on file  . Highest education level: Not on file  Occupational History  . Occupation: Scientist, forensic   Social Needs  . Financial resource strain: Not on file  . Food insecurity:    Worry: Not on file    Inability: Not on file  . Transportation needs:    Medical: Not on file    Non-medical: Not on file  Tobacco Use  . Smoking status: Current Every Day Smoker    Packs/day: 1.00    Years: 20.00    Pack years: 20.00    Types: Cigarettes  . Smokeless tobacco: Never Used  Substance and Sexual Activity  . Alcohol use: No  . Drug use: No  . Sexual activity: Yes    Birth control/protection: None  Lifestyle  . Physical activity:    Days per week: Not on file    Minutes per session: Not on file  . Stress: Not on file  Relationships  . Social connections:    Talks on phone: Not on file    Gets together: Not on file    Attends religious service: Not on file    Active member of club or organization: Not on file    Attends meetings of clubs or organizations: Not on  file    Relationship status: Not on file  . Intimate partner violence:    Fear of current or ex partner: Not on file    Emotionally abused: Not on file    Physically abused: Not on file    Forced sexual activity: Not on file  Other Topics Concern  . Not on file  Social History Narrative  . Not on file     Review of Systems: General: negative for chills, fever, night sweats or weight changes.  Cardiovascular: negative for chest pain, dyspnea on exertion, edema, orthopnea, palpitations, paroxysmal nocturnal dyspnea or shortness of breath Dermatological: negative for rash Respiratory: negative for cough or wheezing Urologic: negative for hematuria Abdominal: negative for nausea, vomiting, diarrhea, bright red blood per rectum, melena, or hematemesis Neurologic: negative for visual  changes, syncope, or dizziness All other systems reviewed and are otherwise negative except as noted above.    Blood pressure 129/87, pulse 76, height 5\' 4"  (1.626 m), weight 170 lb (77.1 kg).  General appearance: alert and no distress Neck: no adenopathy, no carotid bruit, no JVD, supple, symmetrical, trachea midline and thyroid not enlarged, symmetric, no tenderness/mass/nodules Lungs: clear to auscultation bilaterally Heart: regular rate and rhythm, S1, S2 normal, no murmur, click, rub or gallop Extremities: extremities normal, atraumatic, no cyanosis or edema Pulses: 2+ and symmetric Skin: Skin color, texture, turgor normal. No rashes or lesions Neurologic: Alert and oriented X 3, normal strength and tone. Normal symmetric reflexes. Normal coordination and gait  EKG not performed today  ASSESSMENT AND PLAN:   NSTEMI (non-ST elevated myocardial infarction) ([place]) History of non-STEMI status post PCI and drug-eluting stenting of the mid AV circumflex by myself 06/30/17. She did have a 70% proximal LAD lesion which was not addressed. 2-D echo was normal. She had pain in her right groin and had several Doppler studies are ruled out pseudoaneurysm or AV fistula She does complain of atypical chest pain that continues and is constant.she remains on dual antiplatelet therapy with Brilenta and aspirin.  Hyperlipidemia with target LDL less than 70 History of hyperlipidemia and high-dose statin therapy. We will recheck a lipid and liver profile.  Tobacco abuse History of ongoing tobacco abuse having smoked one pack per day and now reducing to 4 cigarettes a day.      Lorretta Harp MD FACP,FACC,FAHA, [facility] 09/03/2017 2:28 PM

## 2017-09-03 NOTE — Progress Notes (Signed)
Cardiac Rehab Medication Review by a Pharmacist  Does the patient  feel that his/her medications are working for him/her?  YES  Has the patient been experiencing any side effects to the medications prescribed?  YES pt complains of having diarrhea and nausea that she feels is coming from the medications which are new for her. Pt did not take any medications prior to her cardiac event  Does the patient measure his/her own blood pressure or blood glucose at home?  NO  Does the patient have any problems obtaining medications due to transportation or finances?   YES pt has not been back to work and has no other source of income for purchasing medications.  Pt has used her brilinta  Card. Advised to contact the drug company for additional benefits and her ability to pay for her medications  Understanding of regimen: poor Understanding of indications: fair Potential of compliance: fair     Comments: pt did not get bupropion filled and does not planned to.  Pt does not take baby aspirin and was not aware she needed to.  Will talk to Dr. Gwenlyn Found this afternoon,.     [name] 09/03/2017 10:11 AM

## 2017-09-03 NOTE — Patient Instructions (Signed)
Medication Instructions: Your physician recommends that you continue on your current medications as directed. Please refer to the Current Medication list given to you today.  Labwork: Your physician recommends that you return for a FASTING lipid profile and hepatic function panel at your earliest convenience.   Follow-Up: We request that you follow-up in: 3 months with [name], PA and in 12 months with Dr Andria Rhein will receive a reminder letter in the mail two months in advance. If you don't receive a letter, please call our office to schedule the follow-up appointment.  If you need a refill on your cardiac medications before your next appointment, please call your pharmacy.

## 2017-09-03 NOTE — Progress Notes (Signed)
Pt in this morning for cardiac rehab orientation. Pt is visibly frustrated and upset over many things.  Pt is upset she has been told to return to work despite not starting CR and seeing the cardiologist in the office. Pt has ongoing exertional chest discomfort with activity. Pt has only had to take her NTG once after a busy day. Pt states the pain resolves with rest. Support and encouragement provided to pt.   Pt completed 6 minute walk test.  Pt tolerated fair.  Pt admits that this is the furthest she has ambulated since coming home from the hospital.   Pt reported 6/10 chest "achiness" that resolved with rest about 2 minutes. Bp 122/80; HR 118.  Pt states that this is common and occurs often.  EKG showed SR with inverted T waves which is present on pt most recent 12 lead ekg.  Pt able to continue with her intake assessment.  Pt has upcoming appt this afternoon with Dr. Gwenlyn Found.  Will copy monitor tracing, walk test sheet for his review. Cherre Huger, BSN Cardiac and Training and development officer

## 2017-09-04 ENCOUNTER — Telehealth (HOSPITAL_COMMUNITY): Payer: Self-pay | Admitting: Cardiac Rehabilitation

## 2017-09-04 NOTE — Telephone Encounter (Signed)
pc to pt to evaluate her cardiology appointment. 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 Pt states she feels anxious and nervous about returning to work prior to starting cardiac rehab. Pt advised to discuss these concerns with her PCP. Pt reports she attempted to contact PCP on 09/03/2017 and is awaiting return call to with appointment date/time. Pt instructed to contact PCP again  09/05/2017 to schedule.  Pt verbalized understanding.  Andi Hence, RN, BSN Cardiac Pulmonary Rehab 09/04/17 5:01 PM

## 2017-09-04 NOTE — Telephone Encounter (Signed)
Pt in the office seeing Dr. Geoffry Paradise today and needs copy of yesterday office note with Dr. Gwenlyn Found on 3/26. Office note faxed to Dr. Elna Breslow office Attn: Aniceto Boss [phone].

## 2017-09-11 ENCOUNTER — Encounter (HOSPITAL_COMMUNITY): Payer: Self-pay

## 2017-09-11 ENCOUNTER — Encounter (HOSPITAL_COMMUNITY): Admission: RE | Admit: 2017-09-11 | Payer: Commercial Managed Care - PPO | Source: Ambulatory Visit

## 2017-09-13 ENCOUNTER — Encounter (HOSPITAL_COMMUNITY): Payer: Commercial Managed Care - PPO

## 2017-09-16 ENCOUNTER — Telehealth (HOSPITAL_COMMUNITY): Payer: Self-pay | Admitting: Cardiac Rehabilitation

## 2017-09-16 ENCOUNTER — Encounter (HOSPITAL_COMMUNITY): Payer: Commercial Managed Care - PPO

## 2017-09-16 NOTE — Telephone Encounter (Signed)
pc to pt to discuss reason for continued absence from cardiac rehab. Pt states she has been sick with cold symptoms . Pt verbalizes interest in coming to exercise. Pt instructed to avoid exercise until 48 hours afebrile and congestion symptoms relieved.  Understanding verbalized.  Andi Hence, RN, BSN Cardiac Pulmonary Rehab 09/16/17 11:32 AM

## 2017-09-18 ENCOUNTER — Encounter (HOSPITAL_COMMUNITY)
Admission: RE | Admit: 2017-09-18 | Discharge: 2017-09-18 | Disposition: A | Discharge status: Home / Self Care | Payer: Commercial Managed Care - PPO | Source: Ambulatory Visit | Attending: Cardiovascular Disease | Admitting: Cardiovascular Disease

## 2017-09-18 ENCOUNTER — Encounter (HOSPITAL_COMMUNITY): Payer: Self-pay

## 2017-09-18 DIAGNOSIS — F17210 Nicotine dependence, cigarettes, uncomplicated: Secondary | ICD-10-CM | POA: Insufficient documentation

## 2017-09-18 DIAGNOSIS — I213 ST elevation (STEMI) myocardial infarction of unspecified site: Secondary | ICD-10-CM

## 2017-09-18 DIAGNOSIS — I2129 ST elevation (STEMI) myocardial infarction involving other sites: Secondary | ICD-10-CM | POA: Insufficient documentation

## 2017-09-18 DIAGNOSIS — Z955 Presence of coronary angioplasty implant and graft: Secondary | ICD-10-CM | POA: Insufficient documentation

## 2017-09-18 DIAGNOSIS — Z79899 Other long term (current) drug therapy: Secondary | ICD-10-CM | POA: Diagnosis not present

## 2017-09-20 ENCOUNTER — Encounter (HOSPITAL_COMMUNITY)
Admission: RE | Admit: 2017-09-20 | Discharge: 2017-09-20 | Disposition: A | Discharge status: Home / Self Care | Payer: Commercial Managed Care - PPO | Source: Ambulatory Visit | Attending: Cardiovascular Disease | Admitting: Cardiovascular Disease

## 2017-09-20 DIAGNOSIS — I2129 ST elevation (STEMI) myocardial infarction involving other sites: Secondary | ICD-10-CM | POA: Diagnosis not present

## 2017-09-20 DIAGNOSIS — I213 ST elevation (STEMI) myocardial infarction of unspecified site: Secondary | ICD-10-CM

## 2017-09-20 DIAGNOSIS — Z79899 Other long term (current) drug therapy: Secondary | ICD-10-CM | POA: Diagnosis not present

## 2017-09-20 DIAGNOSIS — Z955 Presence of coronary angioplasty implant and graft: Secondary | ICD-10-CM

## 2017-09-22 ENCOUNTER — Other Ambulatory Visit: Payer: Self-pay | Admitting: Family Medicine

## 2017-09-23 ENCOUNTER — Encounter (HOSPITAL_COMMUNITY)
Admission: RE | Admit: 2017-09-23 | Discharge: 2017-09-23 | Disposition: A | Discharge status: Home / Self Care | Payer: Commercial Managed Care - PPO | Source: Ambulatory Visit | Attending: Cardiovascular Disease | Admitting: Cardiovascular Disease

## 2017-09-23 DIAGNOSIS — Z955 Presence of coronary angioplasty implant and graft: Secondary | ICD-10-CM

## 2017-09-23 DIAGNOSIS — Z79899 Other long term (current) drug therapy: Secondary | ICD-10-CM | POA: Diagnosis not present

## 2017-09-23 DIAGNOSIS — I2129 ST elevation (STEMI) myocardial infarction involving other sites: Secondary | ICD-10-CM | POA: Diagnosis not present

## 2017-09-23 DIAGNOSIS — I213 ST elevation (STEMI) myocardial infarction of unspecified site: Secondary | ICD-10-CM

## 2017-09-25 ENCOUNTER — Encounter (HOSPITAL_COMMUNITY)
Admission: RE | Admit: 2017-09-25 | Discharge: 2017-09-25 | Disposition: A | Discharge status: Home / Self Care | Payer: Commercial Managed Care - PPO | Source: Ambulatory Visit | Attending: Cardiovascular Disease | Admitting: Cardiovascular Disease

## 2017-09-25 ENCOUNTER — Encounter (HOSPITAL_COMMUNITY): Payer: Self-pay

## 2017-09-25 DIAGNOSIS — I213 ST elevation (STEMI) myocardial infarction of unspecified site: Secondary | ICD-10-CM

## 2017-09-25 DIAGNOSIS — Z955 Presence of coronary angioplasty implant and graft: Secondary | ICD-10-CM | POA: Diagnosis not present

## 2017-09-25 DIAGNOSIS — I2129 ST elevation (STEMI) myocardial infarction involving other sites: Secondary | ICD-10-CM | POA: Diagnosis not present

## 2017-09-25 DIAGNOSIS — Z79899 Other long term (current) drug therapy: Secondary | ICD-10-CM | POA: Diagnosis not present

## 2017-09-26 NOTE — Progress Notes (Signed)
Cardiac Individual Treatment Plan  Patient Details  Name: [name] MRN: [id number] Date of Birth: 09-29-1965 Referring Provider:   Flowsheet Row CARDIAC REHAB PHASE II ORIENTATION from 09/03/2017 in [facility] [facility]  Referring Provider  Shelva Majestic MD      Initial Encounter Date:  [place] PHASE II ORIENTATION from 09/03/2017 in [place]  Date  09/03/17  Referring Provider  Shelva Majestic MD      Visit Diagnosis: Stented coronary artery  ST elevation myocardial infarction (STEMI), unspecified artery ([place])  Patient's Home Medications on Admission:  Current Outpatient Medications:  .  buPROPion (WELLBUTRIN XL) 150 MG 24 hr tablet, Take 1 tablet (150 mg total) by mouth daily. X 2 weeks, then 2 tabs a day. Call for refill so higher dose can be sent in, Disp: 60 tablet, Rfl: 0 .  lisinopril (PRINIVIL,ZESTRIL) 2.5 MG tablet, TAKE 1 TABLET BY MOUTH EVERY DAY, Disp: 30 tablet, Rfl: 0 .  metoprolol tartrate (LOPRESSOR) 25 MG tablet, Take 0.5 tablets (12.5 mg total) by mouth 2 (two) times daily., Disp: 60 tablet, Rfl: 2 .  nitroGLYCERIN (NITROSTAT) 0.4 MG SL tablet, Place 1 tablet (0.4 mg total) under the tongue every 5 (five) minutes x 3 doses as needed for chest pain., Disp: 25 tablet, Rfl: 0 .  ticagrelor (BRILINTA) 90 MG TABS tablet, Take 1 tablet (90 mg total) by mouth 2 (two) times daily., Disp: 180 tablet, Rfl: 2  Past Medical History: Past Medical History:  Diagnosis Date  . Anemia   . Fibroid   . NSTEMI (non-ST elevated myocardial infarction) ([place])    s/p DES to LCX 07/02/17  . Sarcoidosis     Tobacco Use: Social History   Tobacco Use  Smoking Status Current Every Day Smoker  . Packs/day: 0.25  . Years: 20.00  . Pack years: 5.00  . Types: Cigarettes  Smokeless Tobacco Never Used  Tobacco Comment   down to 4 cigarettes per day     Labs: Recent Review Flowsheet Data    Labs for  ITP Cardiac and Pulmonary Rehab Latest Ref Rng & Units 06/24/2013 04/07/2015 06/30/2017 07/01/2017 07/02/2017   Cholestrol 0 - 200 mg/dL - 172 - 136 -   LDLCALC 0 - 99 mg/dL - 118 - 86 -   HDL >40 mg/dL - 33(L) - 33(L) -   Trlycerides <150 mg/dL - 103 - 87 -   Hemoglobin A1c 4.8 - 5.6 % - - 5.7(H) - 5.8(H)   TCO2 0 - 100 mmol/L 24 - - - -      Capillary Blood Glucose: Lab Results  Component Value Date   GLUCAP 74 01/28/2015   GLUCAP 93 12/19/2008     Exercise Target Goals:    Exercise Program Goal: Individual exercise prescription set using results from initial 6 min walk test and THRR while considering  patient's activity barriers and safety.   Exercise Prescription Goal: Initial exercise prescription builds to 30-45 minutes a day of aerobic activity, 2-3 days per week.  Home exercise guidelines will be given to patient during program as part of exercise prescription that the participant will acknowledge.  Activity Barriers & Risk Stratification: Activity Barriers & Cardiac Risk Stratification - 09/03/17 1225    Activity Barriers & Cardiac Risk Stratification          Activity Barriers  Arthritis;Other (comment);Deconditioning;Muscular Weakness;Chest Pain/Angina    Comments  B knee discomfort    Cardiac Risk Stratification  High           6 Minute Walk: 6 Minute Walk    6 Minute Walk    Row Name 09/03/17 1225 09/03/17 1238   Phase  Initial  no documentation   Distance  no documentation  1556 feet   Walk Time  no documentation  6 minutes   # of Rest Breaks  no documentation  0   MPH  no documentation  2.9   METS  no documentation  4.3   RPE  no documentation  13   VO2 Peak  no documentation  15.3   Symptoms  no documentation  Yes (comment)   Comments  no documentation  c/o 6/10 chest achiness at end of walk test   Resting HR  no documentation  70 bpm   Resting BP  no documentation  100/60   Resting Oxygen Saturation   no documentation  100 %   Exercise Oxygen  Saturation  during 6 min walk  no documentation  100 %   Max Ex. HR  no documentation  118 bpm   Max Ex. BP  no documentation  122/80   2 Minute Post BP  no documentation  118/72          Oxygen Initial Assessment:   Oxygen Re-Evaluation:   Oxygen Discharge (Final Oxygen Re-Evaluation):   Initial Exercise Prescription: Initial Exercise Prescription - 09/26/17 1100    Bike          METs  3.19           Perform Capillary Blood Glucose checks as needed.  Exercise Prescription Changes: Exercise Prescription Changes    Response to Exercise    Row Name 09/26/17 1100   Blood Pressure (Admit)  108/76   Blood Pressure (Exercise)  130/70   Blood Pressure (Exit)  102/76   Heart Rate (Admit)  90 bpm   Heart Rate (Exercise)  130 bpm   Heart Rate (Exit)  89 bpm   Perceived Dyspnea (Exercise)  11   Symptoms  none   Duration  Continue with 30 min of aerobic exercise without signs/symptoms of physical distress.   Intensity  THRR unchanged       Progression    Row Name 09/26/17 1100   Progression  Continue to progress workloads to maintain intensity without signs/symptoms of physical distress.       Resistance Training    Row Name 09/26/17 1100   Training Prescription  Yes   Weight  3lb    Reps  10-15       [place] Name 09/26/17 1100   Level  0.9   Minutes  [place] Name 09/26/17 1100   Level  4   SPM  80   Minutes  15   METs  3          Exercise Comments: Exercise Comments    Row Name 09/26/17 1416   Exercise Comments  Pt is off to a great start with exercise. Pt has been orientated to exercise equipment. Will continue to monitor and progress pt as tolerated.       Exercise Goals and Review: Exercise Goals    Exercise Goals    Row Name 09/03/17 1239   Increase Physical Activity  Yes   Intervention  Provide advice, education, support and counseling about physical activity/exercise needs.;Develop an individualized exercise  prescription for aerobic and resistive training based  on initial evaluation findings, risk stratification, comorbidities and participant's personal goals.   Expected Outcomes  Short Term: Attend rehab on a regular basis to increase amount of physical activity.;Long Term: Add in home exercise to make exercise part of routine and to increase amount of physical activity.;Long Term: Exercising regularly at least 3-5 days a week.   Increase Strength and Stamina  Yes   Intervention  Provide advice, education, support and counseling about physical activity/exercise needs.;Develop an individualized exercise prescription for aerobic and resistive training based on initial evaluation findings, risk stratification, comorbidities and participant's personal goals.   Expected Outcomes  Short Term: Increase workloads from initial exercise prescription for resistance, speed, and METs.;Short Term: Perform resistance training exercises routinely during rehab and add in resistance training at home;Long Term: Improve cardiorespiratory fitness, muscular endurance and strength as measured by increased METs and functional capacity (6MWT)   Able to understand and use rate of perceived exertion (RPE) scale  Yes   Intervention  Provide education and explanation on how to use RPE scale   Expected Outcomes  Short Term: Able to use RPE daily in rehab to express subjective intensity level;Long Term:  Able to use RPE to guide intensity level when exercising independently   Knowledge and understanding of Target Heart Rate Range (THRR)  Yes   Intervention  Provide education and explanation of THRR including how the numbers were predicted and where they are located for reference   Expected Outcomes  Short Term: Able to state/look up THRR;Long Term: Able to use THRR to govern intensity when exercising independently;Short Term: Able to use daily as guideline for intensity in rehab   Able to check pulse independently  Yes   Intervention   Provide education and demonstration on how to check pulse in carotid and radial arteries.;Review the importance of being able to check your own pulse for safety during independent exercise   Expected Outcomes  Short Term: Able to explain why pulse checking is important during independent exercise;Long Term: Able to check pulse independently and accurately   Understanding of Exercise Prescription  Yes   Intervention  Provide education, explanation, and written materials on patient's individual exercise prescription   Expected Outcomes  Short Term: Able to explain program exercise prescription;Long Term: Able to explain home exercise prescription to exercise independently          Exercise Goals Re-Evaluation :    Discharge Exercise Prescription (Final Exercise Prescription Changes): Exercise Prescription Changes - 09/26/17 1100    Response to Exercise          Blood Pressure (Admit)  108/76    Blood Pressure (Exercise)  130/70    Blood Pressure (Exit)  102/76    Heart Rate (Admit)  90 bpm    Heart Rate (Exercise)  130 bpm    Heart Rate (Exit)  89 bpm    Perceived Dyspnea (Exercise)  11    Symptoms  none    Duration  Continue with 30 min of aerobic exercise without signs/symptoms of physical distress.    Intensity  THRR unchanged        Progression          Progression  Continue to progress workloads to maintain intensity without signs/symptoms of physical distress.        Resistance Training          Training Prescription  Yes    Weight  3lb     Reps  10-15        Bike  Level  0.9    Minutes  15        NuStep          Level  4    SPM  80    Minutes  15    METs  3           Nutrition:  Target Goals: Understanding of nutrition guidelines, daily intake of sodium 1500mg , cholesterol 200mg , calories 30% from fat and 7% or less from saturated fats, daily to have 5 or more servings of fruits and vegetables.  Biometrics: Pre Biometrics - 09/03/17 1240     Pre Biometrics          Height  5' 4.5" (1.638 m)    Weight  168 lb 6.9 oz (76.4 kg)    Waist Circumference  37 inches    Hip Circumference  43.5 inches    Waist to Hip Ratio  0.85 %    BMI (Calculated)  28.48    Triceps Skinfold  39 mm    % Body Fat  40.6 %    Grip Strength  25 kg    Flexibility  16.5 in    Single Leg Stand  30 seconds            Nutrition Therapy Plan and Nutrition Goals: Nutrition Therapy & Goals - 09/04/17 1055    Nutrition Therapy          Diet  Heart Healthy        Personal Nutrition Goals          Nutrition Goal  Pt to identify and limit food sources of saturated fat, trans fat, and sodium    Personal Goal #2  Pt to identify food quantities necessary to achieve weight loss of 6-24 lb (2.7-10.9 kg) at graduation from cardiac rehab. Goal wt of 145 lb desired.         Intervention Plan          Intervention  Prescribe, educate and counsel regarding individualized specific dietary modifications aiming towards targeted core components such as weight, hypertension, lipid management, diabetes, heart failure and other comorbidities.    Expected Outcomes  Short Term Goal: Understand basic principles of dietary content, such as calories, fat, sodium, cholesterol and nutrients.;Long Term Goal: Adherence to prescribed nutrition plan.           Nutrition Assessments: Nutrition Assessments - 09/04/17 1055    MEDFICTS Scores          Pre Score  44           Nutrition Goals Re-Evaluation:   Nutrition Goals Re-Evaluation:   Nutrition Goals Discharge (Final Nutrition Goals Re-Evaluation):   Psychosocial: Target Goals: Acknowledge presence or absence of significant depression and/or stress, maximize coping skills, provide positive support system. Participant is able to verbalize types and ability to use techniques and skills needed for reducing stress and depression.  Initial Review & Psychosocial Screening: Initial Psych Review & Screening -  09/03/17 1033    Initial Review          Current issues with  Current Depression;Current Stress Concerns;Current Anxiety/Panic    Source of Stress Concerns  Chronic Illness;Unable to participate in former interests or hobbies;Occupation;Financial    Comments  pt voices concern about her health, health care and ability to return to work. pt with food disparities at home.         Family Dynamics          Good Support System?  No    Concerns  No support system        Barriers          Psychosocial barriers to participate in program  There are no identifiable barriers or psychosocial needs.        Screening Interventions          Interventions  Encouraged to exercise;Program counselor consult;To provide support and resources with identified psychosocial needs;Provide feedback about the scores to participant    Expected Outcomes  Short Term goal: Utilizing psychosocial counselor, staff and physician to assist with identification of specific Stressors or current issues interfering with healing process. Setting desired goal for each stressor or current issue identified.;Long Term Goal: Stressors or current issues are controlled or eliminated.;Short Term goal: Identification and review with participant of any Quality of Life or Depression concerns found by scoring the questionnaire.;Long Term goal: The participant improves quality of Life and PHQ9 Scores as seen by post scores and/or verbalization of changes           Quality of Life Scores: Quality of Life - 09/03/17 1112    Quality of Life Scores          Health/Function Pre  10.9 %    Socioeconomic Pre  20.13 %    Psych/Spiritual Pre  24 %    Family Pre  11.6 %    GLOBAL Pre  15.73 %          Scores of 19 and below usually indicate a poorer quality of life in these areas.  A difference of  2-3 points is a clinically meaningful difference.  A difference of 2-3 points in the total score of the Quality of Life Index has been  associated with significant improvement in overall quality of life, self-image, physical symptoms, and general health in studies assessing change in quality of life.  PHQ-9: Recent Review Flowsheet Data    Depression screen [facility] 2/9 09/18/2017 09/18/2017 07/06/2017 04/24/2017 03/08/2017   Decreased Interest 0 0 0 0 0   Down, Depressed, Hopeless 1 0 0 0 0   PHQ - 2 Score 1 0 0 0 0     Interpretation of Total Score  Total Score Depression Severity:  1-4 = Minimal depression, 5-9 = Mild depression, 10-14 = Moderate depression, 15-19 = Moderately severe depression, 20-27 = Severe depression   Psychosocial Evaluation and Intervention: Psychosocial Evaluation - 09/18/17 1709    Psychosocial Evaluation & Interventions          Interventions  Stress management education;Relaxation education;Encouraged to exercise with the program and follow exercise prescription    Comments  pt with health related stress and anxiety.  pt has history of depression however denies present daily symptoms. 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  pt enjoys spending time with her family and friends.     Expected Outcomes  pt will exhibit improved outlook with good coping skills.     Continue Psychosocial Services   No Follow up required           Psychosocial Re-Evaluation: Psychosocial Re-Evaluation    Psychosocial Re-Evaluation    [place] Name 09/25/17 1659   Current issues with  History of Depression;Current Anxiety/Panic;Current Stress Concerns   Comments  pt with health related stress and anxiety.  pt has history of depression however denies present daily symptoms. 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  pt enjoys spending time with her family and friends.  Expected Outcomes  pt will exhibit improved outlook with good coping skills.    Interventions  Encouraged to attend Cardiac Rehabilitation for the exercise;Relaxation education;Stress management education   Continue Psychosocial  Services   Follow up required by staff   Comments  pt voices concern about her health, health care and ability to return to work. pt with food disparities at home.        Initial Review    Row Name 09/25/17 1659   Source of Stress Concerns  Chronic Illness;Unable to participate in former interests or hobbies;Occupation;Financial          Psychosocial Discharge (Final Psychosocial Re-Evaluation): Psychosocial Re-Evaluation - 09/25/17 1659    Psychosocial Re-Evaluation          Current issues with  History of Depression;Current Anxiety/Panic;Current Stress Concerns    Comments  pt with health related stress and anxiety.  pt has history of depression however denies present daily symptoms. 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  pt enjoys spending time with her family and friends.     Expected Outcomes  pt will exhibit improved outlook with good coping skills.     Interventions  Encouraged to attend Cardiac Rehabilitation for the exercise;Relaxation education;Stress management education    Continue Psychosocial Services   Follow up required by staff    Comments  pt voices concern about her health, health care and ability to return to work. pt with food disparities at home.         Initial Review          Source of Stress Concerns  Chronic Illness;Unable to participate in former interests or hobbies;Occupation;Financial           Vocational Rehabilitation: Provide vocational rehab assistance to qualifying candidates.   Vocational Rehab Evaluation & Intervention: Vocational Rehab - 09/03/17 0926    Initial Vocational Rehab Evaluation & Intervention          Assessment shows need for Vocational Rehabilitation  Yes    Vocational Rehab Packet given to patient  09/03/17           Education: Education Goals: Education classes will be provided on a weekly basis, covering required topics. Participant will state understanding/return demonstration of topics  presented.  Learning Barriers/Preferences: Learning Barriers/Preferences - 09/03/17 1225    Learning Barriers/Preferences          Learning Preferences  Skilled Demonstration;Written Material;Verbal Instruction;Video;Individual Instruction           Education Topics: Count Your Pulse:  -Group instruction provided by verbal instruction, demonstration, patient participation and written materials to support subject.  Instructors address importance of being able to find your pulse and how to count your pulse when at home without a heart monitor.  Patients get hands on experience counting their pulse with staff help and individually. Flowsheet Row CARDIAC REHAB PHASE II EXERCISE from 09/25/2017 in [place]  Date  09/20/17  Instruction Review Code  2- Demonstrated Understanding      Heart Attack, Angina, and Risk Factor Modification:  -Group instruction provided by verbal instruction, video, and written materials to support subject.  Instructors address signs and symptoms of angina and heart attacks.    Also discuss risk factors for heart disease and how to make changes to improve heart health risk factors.   Functional Fitness:  -Group instruction provided by verbal instruction, demonstration, patient participation, and written materials to support subject.  Instructors address safety measures for  doing things around the house.  Discuss how to get up and down off the floor, how to pick things up properly, how to safely get out of a chair without assistance, and balance training.   Meditation and Mindfulness:  -Group instruction provided by verbal instruction, patient participation, and written materials to support subject.  Instructor addresses importance of mindfulness and meditation practice to help reduce stress and improve awareness.  Instructor also leads participants through a meditation exercise.    Stretching for Flexibility and Mobility:  -Group  instruction provided by verbal instruction, patient participation, and written materials to support subject.  Instructors lead participants through series of stretches that are designed to increase flexibility thus improving mobility.  These stretches are additional exercise for major muscle groups that are typically performed during regular warm up and cool down.   Hands Only CPR:  -Group verbal, video, and participation provides a basic overview of AHA guidelines for community CPR. Role-play of emergencies allow participants the opportunity to practice calling for help and chest compression technique with discussion of AED use.   Hypertension: -Group verbal and written instruction that provides a basic overview of hypertension including the most recent diagnostic guidelines, risk factor reduction with self-care instructions and medication management.    Nutrition I class: Heart Healthy Eating:  -Group instruction provided by PowerPoint slides, verbal discussion, and written materials to support subject matter. The instructor gives an explanation and review of the Therapeutic Lifestyle Changes diet recommendations, which includes a discussion on lipid goals, dietary fat, sodium, fiber, plant stanol/sterol esters, sugar, and the components of a well-balanced, healthy diet.   Nutrition II class: Lifestyle Skills:  -Group instruction provided by PowerPoint slides, verbal discussion, and written materials to support subject matter. The instructor gives an explanation and review of label reading, grocery shopping for heart health, heart healthy recipe modifications, and ways to make healthier choices when eating out.   Diabetes Question & Answer:  -Group instruction provided by PowerPoint slides, verbal discussion, and written materials to support subject matter. The instructor gives an explanation and review of diabetes co-morbidities, pre- and post-prandial blood glucose goals, pre-exercise blood  glucose goals, signs, symptoms, and treatment of hypoglycemia and hyperglycemia, and foot care basics.   Diabetes Blitz:  -Group instruction provided by PowerPoint slides, verbal discussion, and written materials to support subject matter. The instructor gives an explanation and review of the physiology behind type 1 and type 2 diabetes, diabetes medications and rational behind using different medications, pre- and post-prandial blood glucose recommendations and Hemoglobin A1c goals, diabetes diet, and exercise including blood glucose guidelines for exercising safely.    Portion Distortion:  -Group instruction provided by PowerPoint slides, verbal discussion, written materials, and food models to support subject matter. The instructor gives an explanation of serving size versus portion size, changes in portions sizes over the last 20 years, and what consists of a serving from each food group.   Stress Management:  -Group instruction provided by verbal instruction, video, and written materials to support subject matter.  Instructors review role of stress in heart disease and how to cope with stress positively.     Exercising on Your Own:  -Group instruction provided by verbal instruction, power point, and written materials to support subject.  Instructors discuss benefits of exercise, components of exercise, frequency and intensity of exercise, and end points for exercise.  Also discuss use of nitroglycerin and activating EMS.  Review options of places to exercise outside of rehab.  Review guidelines  for sex with heart disease.   Cardiac Drugs I:  -Group instruction provided by verbal instruction and written materials to support subject.  Instructor reviews cardiac drug classes: antiplatelets, anticoagulants, beta blockers, and statins.  Instructor discusses reasons, side effects, and lifestyle considerations for each drug class.   Cardiac Drugs II:  -Group instruction provided by verbal  instruction and written materials to support subject.  Instructor reviews cardiac drug classes: angiotensin converting enzyme inhibitors (ACE-I), angiotensin II receptor blockers (ARBs), nitrates, and calcium channel blockers.  Instructor discusses reasons, side effects, and lifestyle considerations for each drug class.   Anatomy and Physiology of the Circulatory System:  Group verbal and written instruction and models provide basic cardiac anatomy and physiology, with the coronary electrical and arterial systems. Review of: AMI, Angina, Valve disease, Heart Failure, Peripheral Artery Disease, Cardiac Arrhythmia, Pacemakers, and the ICD. Flowsheet Row CARDIAC REHAB PHASE II EXERCISE from 09/25/2017 in [place]  Date  09/25/17  Instruction Review Code  2- Demonstrated Understanding      Other Education:  -Group or individual verbal, written, or video instructions that support the educational goals of the cardiac rehab program.   Holiday Eating Survival Tips:  -Group instruction provided by PowerPoint slides, verbal discussion, and written materials to support subject matter. The instructor gives patients tips, tricks, and techniques to help them not only survive but enjoy the holidays despite the onslaught of food that accompanies the holidays.   Knowledge Questionnaire Score: Knowledge Questionnaire Score - 09/03/17 0926    Knowledge Questionnaire Score          Pre Score  24/28           Core Components/Risk Factors/Patient Goals at Admission: Personal Goals and Risk Factors at Admission - 09/03/17 1242    Core Components/Risk Factors/Patient Goals on Admission           Weight Management  Yes;Weight Maintenance;Weight Loss    Intervention  Weight Management: Develop a combined nutrition and exercise program designed to reach desired caloric intake, while maintaining appropriate intake of nutrient and fiber, sodium and fats, and appropriate energy  expenditure required for the weight goal.;Weight Management: Provide education and appropriate resources to help participant work on and attain dietary goals.;Weight Management/Obesity: Establish reasonable short term and long term weight goals.    Admit Weight  168 lb 6.9 oz (76.4 kg)    Goal Weight: Short Term  160 lb (72.6 kg)    Goal Weight: Long Term  145 lb (65.8 kg)    Expected Outcomes  Short Term: Continue to assess and modify interventions until short term weight is achieved;Long Term: Adherence to nutrition and physical activity/exercise program aimed toward attainment of established weight goal;Weight Maintenance: Understanding of the daily nutrition guidelines, which includes 25-35% calories from fat, 7% or less cal from saturated fats, less than 200mg  cholesterol, less than 1.5gm of sodium, & 5 or more servings of fruits and vegetables daily;Weight Loss: Understanding of general recommendations for a balanced deficit meal plan, which promotes 1-2 lb weight loss per week and includes a negative energy balance of [phone] kcal/d;Understanding recommendations for meals to include 15-35% energy as protein, 25-35% energy from fat, 35-60% energy from carbohydrates, less than 200mg  of dietary cholesterol, 20-35 gm of total fiber daily;Understanding of distribution of calorie intake throughout the day with the consumption of 4-5 meals/snacks           Core Components/Risk Factors/Patient Goals Review:  Goals and Risk Factor Review  Core Components/Risk Factors/Patient Goals Review    Row Name 09/18/17 1713 09/25/17 1658   Personal Goals Review  Weight Management/Obesity;Tobacco Cessation;Lipids;Stress;Other  Weight Management/Obesity;Tobacco Cessation;Lipids;Stress;Other   Review  pt with multiple CAD RF demonstrates willingness to participate in CR program. pt personal goal is to learn about heart disease, RF and nutrition to prevent disease progression.   pt with multiple CAD RF demonstrates  willingness to participate in CR program. pt personal goal is to learn about heart disease, RF and nutrition to prevent disease progression.Marland Kitchen pt participating exercise and education offerings.     Expected Outcomes  pt will participate in CR exercise, nutrition and lifestyle modification to decrease overall RF.   pt will participate in CR exercise, nutrition and lifestyle modification to decrease overall RF.           Core Components/Risk Factors/Patient Goals at Discharge (Final Review):  Goals and Risk Factor Review - 09/25/17 1658    Core Components/Risk Factors/Patient Goals Review          Personal Goals Review  Weight Management/Obesity;Tobacco Cessation;Lipids;Stress;Other    Review  pt with multiple CAD RF demonstrates willingness to participate in CR program. pt personal goal is to learn about heart disease, RF and nutrition to prevent disease progression.Marland Kitchen pt participating exercise and education offerings.      Expected Outcomes  pt will participate in CR exercise, nutrition and lifestyle modification to decrease overall RF.            ITP Comments: ITP Comments    Row Name 09/03/17 0925 09/18/17 1707 09/25/17 1657   ITP Comments  Dr. Fransico Him, Medical Director   pt started group exercise program. pt oriented to equipment. pt tolerated light activity without difficulty.  30 day ITP review.  pt demonstrates increasing eagerness to participate in CR program. pt interacting positively with peers.       Comments:  Andi Hence, RN, BSN Cardiac Pulmonary Rehab 09/26/17 3:13 PM

## 2017-09-27 ENCOUNTER — Encounter (HOSPITAL_COMMUNITY)
Admission: RE | Admit: 2017-09-27 | Discharge: 2017-09-27 | Disposition: A | Discharge status: Home / Self Care | Payer: Commercial Managed Care - PPO | Source: Ambulatory Visit | Attending: Cardiovascular Disease | Admitting: Cardiovascular Disease

## 2017-09-27 DIAGNOSIS — I213 ST elevation (STEMI) myocardial infarction of unspecified site: Secondary | ICD-10-CM

## 2017-09-27 DIAGNOSIS — Z955 Presence of coronary angioplasty implant and graft: Secondary | ICD-10-CM | POA: Diagnosis not present

## 2017-09-27 DIAGNOSIS — Z79899 Other long term (current) drug therapy: Secondary | ICD-10-CM | POA: Diagnosis not present

## 2017-09-27 DIAGNOSIS — I2129 ST elevation (STEMI) myocardial infarction involving other sites: Secondary | ICD-10-CM | POA: Diagnosis not present

## 2017-09-30 ENCOUNTER — Encounter (HOSPITAL_COMMUNITY): Payer: Commercial Managed Care - PPO

## 2017-10-02 ENCOUNTER — Encounter (HOSPITAL_COMMUNITY): Payer: Commercial Managed Care - PPO

## 2017-10-04 ENCOUNTER — Encounter (HOSPITAL_COMMUNITY): Payer: Commercial Managed Care - PPO

## 2017-10-07 ENCOUNTER — Encounter (HOSPITAL_COMMUNITY): Payer: Commercial Managed Care - PPO

## 2017-10-09 ENCOUNTER — Encounter (HOSPITAL_COMMUNITY): Payer: Commercial Managed Care - PPO

## 2017-10-10 ENCOUNTER — Ambulatory Visit (INDEPENDENT_AMBULATORY_CARE_PROVIDER_SITE_OTHER): Payer: Commercial Managed Care - PPO | Admitting: Cardiovascular Disease

## 2017-10-10 ENCOUNTER — Encounter: Payer: Self-pay | Admitting: Cardiovascular Disease

## 2017-10-10 ENCOUNTER — Telehealth: Payer: Self-pay | Admitting: Physician Assistant

## 2017-10-10 VITALS — BP 118/82 | HR 79 | Ht 64.5 in | Wt 168.0 lb

## 2017-10-10 DIAGNOSIS — I214 Non-ST elevation (NSTEMI) myocardial infarction: Secondary | ICD-10-CM

## 2017-10-10 NOTE — Progress Notes (Signed)
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 She will see round about 3 months back and me in one year. She is asymptomatic.  Lorretta Harp, M.D., [place], [facility], [place] [address] [place], [place]  [address]  [phone] 10/10/2017 2:36 PM

## 2017-10-10 NOTE — Patient Instructions (Signed)
Medication Instructions: Your physician recommends that you continue on your current medications as directed. Please refer to the Current Medication list given to you today.   Testing/Procedures: Your physician has requested that you have an exercise tolerance test. For further information please visit [email or web address]. Please also follow instruction sheet, as given.  Follow-Up:  Keep f/u appt with [name], PA in June.  Follow up with Dr. Gwenlyn Found in March 2020.  If you need a refill on your cardiac medications before your next appointment, please call your pharmacy.     ------NO CHARGE FOR APPT-------

## 2017-10-10 NOTE — Telephone Encounter (Signed)
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

## 2017-10-10 NOTE — Telephone Encounter (Signed)
Pt calling stating in order for her to return to work, the DOT medical doctor is requesting for her to have a stress test. Pt call requesting to have stress test performed at today's appointment. Pt advised she would have to see Dr. Gwenlyn Found and he would have to to determine as to whether she need the test he would have to  place order. Pt verbalized understanding.

## 2017-10-11 ENCOUNTER — Encounter (HOSPITAL_COMMUNITY): Payer: Commercial Managed Care - PPO

## 2017-10-14 ENCOUNTER — Encounter (HOSPITAL_COMMUNITY): Payer: Commercial Managed Care - PPO

## 2017-10-15 ENCOUNTER — Telehealth: Payer: Self-pay | Admitting: Cardiovascular Disease

## 2017-10-15 ENCOUNTER — Ambulatory Visit: Payer: Self-pay | Admitting: Cardiovascular Disease

## 2017-10-15 NOTE — Telephone Encounter (Signed)
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

## 2017-10-16 ENCOUNTER — Telehealth (HOSPITAL_COMMUNITY): Payer: Self-pay

## 2017-10-16 ENCOUNTER — Encounter (HOSPITAL_COMMUNITY): Payer: Commercial Managed Care - PPO

## 2017-10-16 ENCOUNTER — Encounter (HOSPITAL_COMMUNITY): Payer: Self-pay

## 2017-10-16 NOTE — Telephone Encounter (Signed)
Encounter complete. 

## 2017-10-17 ENCOUNTER — Encounter: Payer: Self-pay | Admitting: Physician Assistant

## 2017-10-17 NOTE — Telephone Encounter (Signed)
She can return to work if her stress test is normal.

## 2017-10-17 NOTE — Telephone Encounter (Signed)
If she does well on the stress test, ok to return to work, no restrictions. Thanks

## 2017-10-17 NOTE — Telephone Encounter (Signed)
NOTIFIED Dasher AWAITING  FOR GXT   RESULT SHOULD KNOW NEXT WEEK   VOICE UNDERSTANDING.

## 2017-10-18 ENCOUNTER — Encounter (HOSPITAL_COMMUNITY): Payer: Commercial Managed Care - PPO

## 2017-10-18 ENCOUNTER — Ambulatory Visit (HOSPITAL_COMMUNITY)
Admission: RE | Admit: 2017-10-18 | Discharge: 2017-10-18 | Disposition: A | Discharge status: Home / Self Care | Payer: Commercial Managed Care - PPO | Source: Ambulatory Visit | Attending: Cardiology | Admitting: Cardiology

## 2017-10-18 DIAGNOSIS — I214 Non-ST elevation (NSTEMI) myocardial infarction: Secondary | ICD-10-CM | POA: Insufficient documentation

## 2017-10-18 LAB — EXERCISE TOLERANCE TEST
CHL CUP RESTING HR STRESS: 82 {beats}/min
CSEPED: 6 min
CSEPEDS: 0 s
CSEPEW: 7 METS
CSEPPHR: 148 {beats}/min
MPHR: 169 {beats}/min
Percent HR: 87 %
RPE: 17

## 2017-10-21 ENCOUNTER — Encounter (HOSPITAL_COMMUNITY): Payer: Commercial Managed Care - PPO

## 2017-10-22 ENCOUNTER — Telehealth: Payer: Self-pay | Admitting: *Deleted

## 2017-10-22 DIAGNOSIS — R072 Precordial pain: Secondary | ICD-10-CM

## 2017-10-22 NOTE — Telephone Encounter (Addendum)
Left message for pt to call   ----- Message from Lorretta Harp, MD sent at 10/19/2017  9:03 AM EDT ----- Order Carlton Adam Myoview stress test

## 2017-10-23 ENCOUNTER — Encounter (HOSPITAL_COMMUNITY): Payer: Commercial Managed Care - PPO

## 2017-10-23 NOTE — Telephone Encounter (Signed)
Informed insurance - disability. Patient is requiring additional test- stress myoview before being release for work

## 2017-10-24 NOTE — Addendum Note (Signed)
Encounter addended by: Dorna Bloom D on: 10/24/2017 2:12 PM  Actions taken: Flowsheet data copied forward, Flowsheet accepted, Visit Navigator Flowsheet section accepted

## 2017-10-25 ENCOUNTER — Encounter (HOSPITAL_COMMUNITY): Payer: Commercial Managed Care - PPO

## 2017-10-27 ENCOUNTER — Other Ambulatory Visit: Payer: Self-pay | Admitting: Family Medicine

## 2017-10-28 ENCOUNTER — Encounter (HOSPITAL_COMMUNITY): Payer: Commercial Managed Care - PPO

## 2017-10-28 NOTE — Addendum Note (Signed)
Encounter addended by: Dorna Bloom D on: 10/28/2017 3:54 PM  Actions taken: Flowsheet data copied forward, Visit Navigator Flowsheet section accepted

## 2017-10-29 ENCOUNTER — Telehealth (HOSPITAL_COMMUNITY): Payer: Self-pay

## 2017-10-29 NOTE — Telephone Encounter (Signed)
Encounter complete. 

## 2017-10-30 ENCOUNTER — Encounter (HOSPITAL_COMMUNITY): Payer: Commercial Managed Care - PPO

## 2017-10-31 ENCOUNTER — Ambulatory Visit (HOSPITAL_COMMUNITY)
Admission: RE | Admit: 2017-10-31 | Discharge: 2017-10-31 | Disposition: A | Discharge status: Home / Self Care | Payer: Commercial Managed Care - PPO | Source: Ambulatory Visit | Attending: Internal Medicine | Admitting: Internal Medicine

## 2017-10-31 DIAGNOSIS — R072 Precordial pain: Secondary | ICD-10-CM

## 2017-10-31 LAB — MYOCARDIAL PERFUSION IMAGING
CHL CUP NUCLEAR SDS: 1
CHL CUP NUCLEAR SRS: 11
CHL CUP NUCLEAR SSS: 12
LV dias vol: 77 mL (ref 46–106)
LVSYSVOL: 31 mL
Peak HR: 114 {beats}/min
Rest HR: 70 {beats}/min
TID: 1.2

## 2017-10-31 MED ORDER — REGADENOSON 0.4 MG/5ML IV SOLN
0.4000 mg | Freq: Once | INTRAVENOUS | Status: AC
  Administered 2017-10-31: 0.4 mg via INTRAVENOUS

## 2017-10-31 MED ORDER — TECHNETIUM TC 99M TETROFOSMIN IV KIT
10.0000 | PACK | Freq: Once | INTRAVENOUS | Status: AC | PRN
  Administered 2017-10-31: 10 via INTRAVENOUS
  Filled 2017-10-31: qty 10

## 2017-10-31 MED ORDER — TECHNETIUM TC 99M TETROFOSMIN IV KIT
29.2000 | PACK | Freq: Once | INTRAVENOUS | Status: AC | PRN
  Administered 2017-10-31: 29.2 via INTRAVENOUS
  Filled 2017-10-31: qty 30

## 2017-11-01 ENCOUNTER — Encounter (HOSPITAL_COMMUNITY): Payer: Commercial Managed Care - PPO

## 2017-11-06 ENCOUNTER — Encounter (HOSPITAL_COMMUNITY): Payer: Commercial Managed Care - PPO

## 2017-11-06 ENCOUNTER — Telehealth: Payer: Self-pay | Admitting: Cardiovascular Disease

## 2017-11-06 NOTE — Telephone Encounter (Signed)
lexiscan has been completed

## 2017-11-06 NOTE — Telephone Encounter (Signed)
New Message   Mickel Baas at [facility] states she is calling about the pt's disability status and wants to know when the pt can return back to work since he had the stress test that was required. Please call

## 2017-11-06 NOTE — Telephone Encounter (Signed)
Call placed to the patient. She was informed that Mickel Baas from [place] had called asking when the patient could return to work. The patient stated that it was permissible for the office to tell [facility] that the patient had a follow up appointment with Dr. Gwenlyn Found on 6/4 and would know more information after that appointment.   Message has been left with Mickel Baas at [facility] to call back.  The patient also stated that she would need a letter for work stating why she was out and when she could return.

## 2017-11-07 ENCOUNTER — Encounter: Payer: Self-pay | Admitting: *Deleted

## 2017-11-07 NOTE — Telephone Encounter (Signed)
Returned call to Mickel Baas (ok per patient, see note 5/29)-made aware of appt 6/4 and return to work TBD at that time.   She verbalized understanding.

## 2017-11-07 NOTE — Telephone Encounter (Signed)
Follow up   [name] at [facility], returning call for nurse

## 2017-11-07 NOTE — Progress Notes (Signed)
This encounter was created in error - please disregard.

## 2017-11-08 ENCOUNTER — Encounter (HOSPITAL_COMMUNITY): Payer: Commercial Managed Care - PPO

## 2017-11-08 NOTE — Telephone Encounter (Signed)
Returned the call to the patient. She will discuss a work note at her appointment with Dr. Gwenlyn Found on 11/12/17

## 2017-11-11 ENCOUNTER — Encounter (HOSPITAL_COMMUNITY): Payer: Commercial Managed Care - PPO

## 2017-11-12 ENCOUNTER — Encounter: Payer: Self-pay | Admitting: Cardiovascular Disease

## 2017-11-12 ENCOUNTER — Ambulatory Visit (INDEPENDENT_AMBULATORY_CARE_PROVIDER_SITE_OTHER): Payer: Commercial Managed Care - PPO | Admitting: Cardiovascular Disease

## 2017-11-12 VITALS — BP 118/81 | HR 85 | Ht 64.0 in | Wt 174.0 lb

## 2017-11-12 DIAGNOSIS — E785 Hyperlipidemia, unspecified: Secondary | ICD-10-CM | POA: Diagnosis not present

## 2017-11-12 DIAGNOSIS — Z720 Tobacco use: Secondary | ICD-10-CM

## 2017-11-12 DIAGNOSIS — R079 Chest pain, unspecified: Secondary | ICD-10-CM | POA: Diagnosis not present

## 2017-11-12 DIAGNOSIS — I214 Non-ST elevation (NSTEMI) myocardial infarction: Secondary | ICD-10-CM | POA: Diagnosis not present

## 2017-11-12 MED ORDER — ATORVASTATIN CALCIUM 80 MG PO TABS: 80.0000 mg | ORAL_TABLET | Freq: Every day | ORAL | 3 refills | Status: DC

## 2017-11-12 NOTE — Progress Notes (Signed)
11/12/2017 GENISIS SONNIER   [birth year]  [id number]  Primary Physician Shawnee Knapp, MD Primary Cardiologist: Lorretta Harp MD [name], [place]  HPI:  [name] is a 52 y.o.  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  I last saw her in the office 09/03/2017.  She is being seen by me for a post hospital visit after a non-STEMI and occurred on 06/30/17 status post PCI and drug-eluting stenting of the proximal AV groove circumflex. She did have a 70% proximal LAD lesion which did I did not think was physiologically significant. 2-D echo performed 07/01/17 showed normal LV function. She does have a history of sarcoidosisas well. She has long history of tobacco abuse having smoked a pack a day for 30 years currently smoking 4 cigarettes a day. She continues to have atypical constant chest pain.  Since I saw her in the office she had a routine GXT which was abnormal and subsequent Myoview stress test that showed inferolateral scar without ischemia was likely from her circumflex infarct.  She has somewhat reduce her tobacco intake down to several cigarettes a day.  She was on high-dose atorvastatin which apparently she is no longer on for unclear reasons nor she on baby aspirin.  I told her that she can go back to work.    Current Meds  Medication Sig  . aspirin EC 81 MG tablet Take 81 mg by mouth daily.  Marland Kitchen buPROPion (WELLBUTRIN XL) 150 MG 24 hr tablet Take 1 tablet (150 mg total) by mouth daily. X 2 weeks, then 2 tabs a day. Call for refill so higher dose can be sent in  . lisinopril (PRINIVIL,ZESTRIL) 2.5 MG tablet TAKE 1 TABLET BY MOUTH EVERY DAY  . metoprolol tartrate (LOPRESSOR) 25 MG tablet Take 0.5 tablets (12.5 mg total) by mouth 2 (two) times daily.  . nitroGLYCERIN (NITROSTAT) 0.4 MG SL tablet Place 1 tablet (0.4 mg total) under the tongue every 5 (five) minutes x 3 doses as needed  for chest pain.  . ticagrelor (BRILINTA) 90 MG TABS tablet Take 1 tablet (90 mg total) by mouth 2 (two) times daily.     Allergies  Allergen Reactions  . Hydrocodone Hives and Itching    Social History   Socioeconomic History  . Marital status: Single    Spouse name: Not on file  . Number of children: 2  . Years of education: Not on file  . Highest education level: Not on file  Occupational History  . Occupation: Chiropodist: [employer]  . Financial resource strain: Not on file  . Food insecurity:    Worry: Not on file    Inability: Not on file  . Transportation needs:    Medical: Not on file    Non-medical: Not on file  Tobacco Use  . Smoking status: Current Every Day Smoker    Packs/day: 0.25    Years: 20.00    Pack years: 5.00    Types: Cigarettes  . Smokeless tobacco: Never Used  . Tobacco comment: down to 4 cigarettes per day   Substance and Sexual Activity  . Alcohol use: No  . Drug use: No  . Sexual activity: Yes    Birth control/protection: None  Lifestyle  . Physical activity:    Days per week: Not on file    Minutes per  session: Not on file  . Stress: Not on file  Relationships  . Social connections:    Talks on phone: Not on file    Gets together: Not on file    Attends religious service: Not on file    Active member of club or organization: Not on file    Attends meetings of clubs or organizations: Not on file    Relationship status: Not on file  . Intimate partner violence:    Fear of current or ex partner: Not on file    Emotionally abused: Not on file    Physically abused: Not on file    Forced sexual activity: Not on file  Other Topics Concern  . Not on file  Social History Narrative  . Not on file     Review of Systems: General: negative for chills, fever, night sweats or weight changes.  Cardiovascular: negative for chest pain, dyspnea on exertion, edema, orthopnea, palpitations,  paroxysmal nocturnal dyspnea or shortness of breath Dermatological: negative for rash Respiratory: negative for cough or wheezing Urologic: negative for hematuria Abdominal: negative for nausea, vomiting, diarrhea, bright red blood per rectum, melena, or hematemesis Neurologic: negative for visual changes, syncope, or dizziness All other systems reviewed and are otherwise negative except as noted above.    Blood pressure 118/81, pulse 85, height 5\' 4"  (1.626 m), weight 174 lb (78.9 kg), SpO2 100 %.  General appearance: alert and no distress Neck: no adenopathy, no carotid bruit, no JVD, supple, symmetrical, trachea midline and thyroid not enlarged, symmetric, no tenderness/mass/nodules Lungs: clear to auscultation bilaterally Heart: regular rate and rhythm, S1, S2 normal, no murmur, click, rub or gallop Extremities: extremities normal, atraumatic, no cyanosis or edema Pulses: 2+ and symmetric Skin: Skin color, texture, turgor normal. No rashes or lesions Neurologic: Alert and oriented X 3, normal strength and tone. Normal symmetric reflexes. Normal coordination and gait  EKG not performed today  ASSESSMENT AND PLAN:   NSTEMI (non-ST elevated myocardial infarction) ([place]) History of non-STEMI 06/30/2017 with PCI of a high-grade mid AV groove circumflex stenosis using a synergy drug-eluting stent.  She did have an occluded distal OM branch and a 70% proximal LAD stenosis which would not appear to be physiologically significant.  Her LV function was normal.  Recent Myoview stress test showed inferolateral scar without ischemia.  She is on Brilinta twice daily but for some reason is not on baby aspirin which I will restart.  Hyperlipidemia with target LDL less than 70 History of hyperlipidemia lipid profile performed 07/01/2017 revealing total cholesterol 136 LDL 86 and HDL of 33.  She was on high-dose atorvastatin when I saw her last night for some reason is not on it currently.  We will  restart this.  Tobacco abuse Ongoing tobacco abuse of several cigarettes a day recalcitrant to risk factor modification.      Lorretta Harp MD FACP,FACC,FAHA, [facility] 11/12/2017 10:53 AM

## 2017-11-12 NOTE — Patient Instructions (Signed)
Medication Instructions: Restart Aspirin 81 mg daily  Restart Atorvastatin 80 mg daily.   Follow-Up: We request that you follow-up in: 6 months with an extender and in 12 months with Dr Andria Rhein will receive a reminder letter in the mail two months in advance. If you don't receive a letter, please call our office to schedule the follow-up appointment.  If you need a refill on your cardiac medications before your next appointment, please call your pharmacy.

## 2017-11-12 NOTE — Assessment & Plan Note (Signed)
Ongoing tobacco abuse of several cigarettes a day recalcitrant to risk factor modification.

## 2017-11-12 NOTE — Assessment & Plan Note (Signed)
History of hyperlipidemia lipid profile performed 07/01/2017 revealing total cholesterol 136 LDL 86 and HDL of 33.  She was on high-dose atorvastatin when I saw her last night for some reason is not on it currently.  We will restart this.

## 2017-11-12 NOTE — Progress Notes (Signed)
Discharge Progress Report  Patient Details  Name: [name] MRN: [id number] Date of Birth: Mar 12, 1966 Referring Provider:   Flowsheet Row CARDIAC REHAB PHASE II ORIENTATION from 09/03/2017 in [facility]  Referring Provider  Shelva Majestic MD       Number of Visits: 6   Reason for Discharge:  Early Exit:  Personal  Smoking History:  Social History   Tobacco Use  Smoking Status Current Every Day Smoker  . Packs/day: 0.25  . Years: 20.00  . Pack years: 5.00  . Types: Cigarettes  Smokeless Tobacco Never Used  Tobacco Comment   down to 4 cigarettes per day     Diagnosis:  Stented coronary artery  ST elevation myocardial infarction (STEMI), unspecified artery (HCC)  ADL UCSD:   Initial Exercise Prescription: Initial Exercise Prescription - 09/26/17 1100    Bike          METs  3.19           Discharge Exercise Prescription (Final Exercise Prescription Changes): Exercise Prescription Changes - 09/27/17 1406    Response to Exercise          Blood Pressure (Admit)  117/82    Blood Pressure (Exercise)  130/64    Blood Pressure (Exit)  115/83    Heart Rate (Admit)  88 bpm    Heart Rate (Exercise)  131 bpm    Heart Rate (Exit)  90 bpm    Perceived Dyspnea (Exercise)  11    Symptoms  none    Duration  Continue with 30 min of aerobic exercise without signs/symptoms of physical distress.    Intensity  THRR unchanged        Progression          Progression  Continue to progress workloads to maintain intensity without signs/symptoms of physical distress.    Average METs  3.3        Resistance Training          Training Prescription  Yes    Weight  3lb     Reps  10-15    Time  10 Minutes        Bike          Level  0.9    Minutes  15    METs  3.19        NuStep          Level  4    SPM  90    Minutes  15    METs  3.5           Functional Capacity: 6 Minute Walk    6 Minute Walk    Row Name 09/03/17  1225 09/03/17 1238   Phase  Initial  no documentation   Distance  no documentation  1556 feet   Walk Time  no documentation  6 minutes   # of Rest Breaks  no documentation  0   MPH  no documentation  2.9   METS  no documentation  4.3   RPE  no documentation  13   VO2 Peak  no documentation  15.3   Symptoms  no documentation  Yes (comment)   Comments  no documentation  c/o 6/10 chest achiness at end of walk test   Resting HR  no documentation  70 bpm   Resting BP  no documentation  100/60   Resting Oxygen Saturation   no documentation  100 %   Exercise Oxygen Saturation  during 6 min walk  no documentation  100 %   Max Ex. HR  no documentation  118 bpm   Max Ex. BP  no documentation  122/80   2 Minute Post BP  no documentation  118/72          Psychological, QOL, Others - Outcomes: PHQ 2/9: Depression screen [facility] 2/9 09/18/2017 09/18/2017 07/06/2017 04/24/2017 03/08/2017  Decreased Interest 0 0 0 0 0  Down, Depressed, Hopeless 1 0 0 0 0  PHQ - 2 Score 1 0 0 0 0    Quality of Life: Quality of Life - 09/03/17 1112    Quality of Life Scores          Health/Function Pre  10.9 %    Socioeconomic Pre  20.13 %    Psych/Spiritual Pre  24 %    Family Pre  11.6 %    GLOBAL Pre  15.73 %           Personal Goals: Goals established at orientation with interventions provided to work toward goal. Personal Goals and Risk Factors at Admission - 09/03/17 1242    Core Components/Risk Factors/Patient Goals on Admission           Weight Management  Yes;Weight Maintenance;Weight Loss    Intervention  Weight Management: Develop a combined nutrition and exercise program designed to reach desired caloric intake, while maintaining appropriate intake of nutrient and fiber, sodium and fats, and appropriate energy expenditure required for the weight goal.;Weight Management: Provide education and appropriate resources to help participant work on and attain dietary goals.;Weight Management/Obesity:  Establish reasonable short term and long term weight goals.    Admit Weight  168 lb 6.9 oz (76.4 kg)    Goal Weight: Short Term  160 lb (72.6 kg)    Goal Weight: Long Term  145 lb (65.8 kg)    Expected Outcomes  Short Term: Continue to assess and modify interventions until short term weight is achieved;Long Term: Adherence to nutrition and physical activity/exercise program aimed toward attainment of established weight goal;Weight Maintenance: Understanding of the daily nutrition guidelines, which includes 25-35% calories from fat, 7% or less cal from saturated fats, less than 200mg  cholesterol, less than 1.5gm of sodium, & 5 or more servings of fruits and vegetables daily;Weight Loss: Understanding of general recommendations for a balanced deficit meal plan, which promotes 1-2 lb weight loss per week and includes a negative energy balance of [phone] kcal/d;Understanding recommendations for meals to include 15-35% energy as protein, 25-35% energy from fat, 35-60% energy from carbohydrates, less than 200mg  of dietary cholesterol, 20-35 gm of total fiber daily;Understanding of distribution of calorie intake throughout the day with the consumption of 4-5 meals/snacks            Personal Goals Discharge: Goals and Risk Factor Review    Core Components/Risk Factors/Patient Goals Review    Row Name 09/18/17 1713 09/25/17 1658 11/12/17 1013   Personal Goals Review  Weight Management/Obesity;Tobacco Cessation;Lipids;Stress;Other  Weight Management/Obesity;Tobacco Cessation;Lipids;Stress;Other  Weight Management/Obesity;Tobacco Cessation;Lipids;Stress;Other   Review  pt with multiple CAD RF demonstrates willingness to participate in CR program. pt personal goal is to learn about heart disease, RF and nutrition to prevent disease progression.   pt with multiple CAD RF demonstrates willingness to participate in CR program. pt personal goal is to learn about heart disease, RF and nutrition to prevent disease  progression.Marland Kitchen pt participating exercise and education offerings.    pt exited CR program early.  pt willl continue  to work towards risk factor reducation on her own.    Expected Outcomes  pt will participate in CR exercise, nutrition and lifestyle modification to decrease overall RF.   pt will participate in CR exercise, nutrition and lifestyle modification to decrease overall RF.   pt will participate in  exercise, nutrition and lifestyle modification to decrease overall RF own her own in the community.,           Exercise Goals and Review: Exercise Goals    Exercise Goals    Row Name 09/03/17 1239   Increase Physical Activity  Yes   Intervention  Provide advice, education, support and counseling about physical activity/exercise needs.;Develop an individualized exercise prescription for aerobic and resistive training based on initial evaluation findings, risk stratification, comorbidities and participant's personal goals.   Expected Outcomes  Short Term: Attend rehab on a regular basis to increase amount of physical activity.;Long Term: Add in home exercise to make exercise part of routine and to increase amount of physical activity.;Long Term: Exercising regularly at least 3-5 days a week.   Increase Strength and Stamina  Yes   Intervention  Provide advice, education, support and counseling about physical activity/exercise needs.;Develop an individualized exercise prescription for aerobic and resistive training based on initial evaluation findings, risk stratification, comorbidities and participant's personal goals.   Expected Outcomes  Short Term: Increase workloads from initial exercise prescription for resistance, speed, and METs.;Short Term: Perform resistance training exercises routinely during rehab and add in resistance training at home;Long Term: Improve cardiorespiratory fitness, muscular endurance and strength as measured by increased METs and functional capacity (6MWT)   Able to  understand and use rate of perceived exertion (RPE) scale  Yes   Intervention  Provide education and explanation on how to use RPE scale   Expected Outcomes  Short Term: Able to use RPE daily in rehab to express subjective intensity level;Long Term:  Able to use RPE to guide intensity level when exercising independently   Knowledge and understanding of Target Heart Rate Range (THRR)  Yes   Intervention  Provide education and explanation of THRR including how the numbers were predicted and where they are located for reference   Expected Outcomes  Short Term: Able to state/look up THRR;Long Term: Able to use THRR to govern intensity when exercising independently;Short Term: Able to use daily as guideline for intensity in rehab   Able to check pulse independently  Yes   Intervention  Provide education and demonstration on how to check pulse in carotid and radial arteries.;Review the importance of being able to check your own pulse for safety during independent exercise   Expected Outcomes  Short Term: Able to explain why pulse checking is important during independent exercise;Long Term: Able to check pulse independently and accurately   Understanding of Exercise Prescription  Yes   Intervention  Provide education, explanation, and written materials on patient's individual exercise prescription   Expected Outcomes  Short Term: Able to explain program exercise prescription;Long Term: Able to explain home exercise prescription to exercise independently          Nutrition & Weight - Outcomes: Pre Biometrics - 09/03/17 1240    Pre Biometrics          Height  5' 4.5" (1.638 m)    Weight  168 lb 6.9 oz (76.4 kg)    Waist Circumference  37 inches    Hip Circumference  43.5 inches    Waist to Hip Ratio  0.85 %  BMI (Calculated)  28.48    Triceps Skinfold  39 mm    % Body Fat  40.6 %    Grip Strength  25 kg    Flexibility  16.5 in    Single Leg Stand  30 seconds             Nutrition: Nutrition Therapy & Goals - 09/04/17 1055    Nutrition Therapy          Diet  Heart Healthy        Personal Nutrition Goals          Nutrition Goal  Pt to identify and limit food sources of saturated fat, trans fat, and sodium    Personal Goal #2  Pt to identify food quantities necessary to achieve weight loss of 6-24 lb (2.7-10.9 kg) at graduation from cardiac rehab. Goal wt of 145 lb desired.         Intervention Plan          Intervention  Prescribe, educate and counsel regarding individualized specific dietary modifications aiming towards targeted core components such as weight, hypertension, lipid management, diabetes, heart failure and other comorbidities.    Expected Outcomes  Short Term Goal: Understand basic principles of dietary content, such as calories, fat, sodium, cholesterol and nutrients.;Long Term Goal: Adherence to prescribed nutrition plan.           Nutrition Discharge: Nutrition Assessments - 09/04/17 1055    MEDFICTS Scores          Pre Score  44           Education Questionnaire Score: Knowledge Questionnaire Score - 09/03/17 0926    Knowledge Questionnaire Score          Pre Score  24/28           Goals reviewed with patient; copy given to patient.

## 2017-11-12 NOTE — Addendum Note (Signed)
Encounter addended by: Lowell Guitar, RN on: 11/12/2017 10:18 AM  Actions taken: Episode resolved

## 2017-11-12 NOTE — Assessment & Plan Note (Signed)
History of non-STEMI 06/30/2017 with PCI of a high-grade mid AV groove circumflex stenosis using a synergy drug-eluting stent.  She did have an occluded distal OM branch and a 70% proximal LAD stenosis which would not appear to be physiologically significant.  Her LV function was normal.  Recent Myoview stress test showed inferolateral scar without ischemia.  She is on Brilinta twice daily but for some reason is not on baby aspirin which I will restart.

## 2017-11-12 NOTE — Addendum Note (Signed)
Encounter addended by: Lowell Guitar, RN on: 11/12/2017 10:16 AM  Actions taken: Flowsheet data copied forward, Visit Navigator Flowsheet section accepted, Sign clinical note

## 2017-11-13 ENCOUNTER — Encounter (HOSPITAL_COMMUNITY): Payer: Commercial Managed Care - PPO

## 2017-11-15 ENCOUNTER — Encounter (HOSPITAL_COMMUNITY): Payer: Commercial Managed Care - PPO

## 2017-11-17 ENCOUNTER — Other Ambulatory Visit: Payer: Self-pay | Admitting: Family Medicine

## 2017-11-18 ENCOUNTER — Encounter (HOSPITAL_COMMUNITY): Payer: Commercial Managed Care - PPO

## 2017-11-20 ENCOUNTER — Encounter (HOSPITAL_COMMUNITY): Payer: Commercial Managed Care - PPO

## 2017-11-22 ENCOUNTER — Encounter (HOSPITAL_COMMUNITY): Payer: Commercial Managed Care - PPO

## 2017-11-25 ENCOUNTER — Encounter (HOSPITAL_COMMUNITY): Payer: Commercial Managed Care - PPO

## 2017-11-27 ENCOUNTER — Encounter (HOSPITAL_COMMUNITY): Payer: Commercial Managed Care - PPO

## 2017-11-27 ENCOUNTER — Other Ambulatory Visit: Payer: Self-pay | Admitting: Family Medicine

## 2017-11-28 NOTE — Telephone Encounter (Signed)
Patient called and advised she will need to schedule and appointment and be seen in the office before medication will be filled. I asked how many pills does she have of lisinopril, she says about 6 days worth.  She agrees to schedule, appointment scheduled for Monday, 12/02/17 at 1000 with Dr. Brigitte Pulse. She was advised not to cancel the appointment as it will impact her not getting refills on her medications, she verbalized understanding.

## 2017-11-29 ENCOUNTER — Encounter (HOSPITAL_COMMUNITY): Payer: Commercial Managed Care - PPO

## 2017-12-02 ENCOUNTER — Ambulatory Visit (INDEPENDENT_AMBULATORY_CARE_PROVIDER_SITE_OTHER): Payer: Commercial Managed Care - PPO | Admitting: Family Medicine

## 2017-12-02 ENCOUNTER — Encounter: Payer: Self-pay | Admitting: Family Medicine

## 2017-12-02 ENCOUNTER — Encounter (HOSPITAL_COMMUNITY): Payer: Commercial Managed Care - PPO

## 2017-12-02 ENCOUNTER — Other Ambulatory Visit: Payer: Self-pay

## 2017-12-02 VITALS — BP 123/82 | HR 82 | Temp 98.0°F | Resp 17 | Ht 64.0 in | Wt 177.6 lb

## 2017-12-02 DIAGNOSIS — I25110 Atherosclerotic heart disease of native coronary artery with unstable angina pectoris: Secondary | ICD-10-CM | POA: Diagnosis not present

## 2017-12-02 DIAGNOSIS — Z1211 Encounter for screening for malignant neoplasm of colon: Secondary | ICD-10-CM | POA: Diagnosis not present

## 2017-12-02 DIAGNOSIS — Z1231 Encounter for screening mammogram for malignant neoplasm of breast: Secondary | ICD-10-CM

## 2017-12-02 DIAGNOSIS — E785 Hyperlipidemia, unspecified: Secondary | ICD-10-CM | POA: Diagnosis not present

## 2017-12-02 DIAGNOSIS — Z1212 Encounter for screening for malignant neoplasm of rectum: Secondary | ICD-10-CM

## 2017-12-02 LAB — LIPID PANEL
CHOLESTEROL TOTAL: 112 mg/dL (ref 100–199)
Chol/HDL Ratio: 3 ratio (ref 0.0–4.4)
HDL: 37 mg/dL — AB (ref >39)
LDL CALC: 54 mg/dL (ref 0–99)
Triglycerides: 104 mg/dL (ref 0–149)
VLDL CHOLESTEROL CAL: 21 mg/dL (ref 5–40)

## 2017-12-02 LAB — COMPREHENSIVE METABOLIC PANEL
ALBUMIN: 4.1 g/dL (ref 3.5–5.5)
ALK PHOS: 137 IU/L — AB (ref 39–117)
ALT: 21 IU/L (ref 0–32)
AST: 19 IU/L (ref 0–40)
Albumin/Globulin Ratio: 2 (ref 1.2–2.2)
BILIRUBIN TOTAL: 0.4 mg/dL (ref 0.0–1.2)
BUN / CREAT RATIO: 17 (ref 9–23)
BUN: 12 mg/dL (ref 6–24)
CHLORIDE: 109 mmol/L — AB (ref 96–106)
CO2: 19 mmol/L — AB (ref 20–29)
Calcium: 10.3 mg/dL — ABNORMAL HIGH (ref 8.7–10.2)
Creatinine, Ser: 0.71 mg/dL (ref 0.57–1.00)
GFR calc Af Amer: 114 mL/min/{1.73_m2} (ref >59)
GFR calc non Af Amer: 99 mL/min/{1.73_m2} (ref >59)
GLOBULIN, TOTAL: 2.1 g/dL (ref 1.5–4.5)
Glucose: 97 mg/dL (ref 65–99)
POTASSIUM: 4.5 mmol/L (ref 3.5–5.2)
SODIUM: 140 mmol/L (ref 134–144)
Total Protein: 6.2 g/dL (ref 6.0–8.5)

## 2017-12-02 MED ORDER — LISINOPRIL 2.5 MG PO TABS: 2.5000 mg | ORAL_TABLET | Freq: Every day | ORAL | 3 refills | Status: DC

## 2017-12-02 MED ORDER — ATORVASTATIN CALCIUM 80 MG PO TABS: 80.0000 mg | ORAL_TABLET | Freq: Every day | ORAL | 3 refills | Status: DC

## 2017-12-02 MED ORDER — METOPROLOL TARTRATE 25 MG PO TABS: 12.5000 mg | ORAL_TABLET | Freq: Two times a day (BID) | ORAL | 2 refills | Status: DC

## 2017-12-02 NOTE — Progress Notes (Signed)
Subjective:  By signing my name below, I, [name], attest that this documentation has been prepared under the direction and in the presence of [name], MD. Electronically Signed: [name], [place]. 12/02/2017 , 10:49 AM .  Patient was seen in Room 2 .   Patient ID: [name], female    DOB: 04-13-1966, 52 y.o.   MRN: [id number] Chief Complaint  Patient presents with  . Hypertension    f/u   HPI [name] is a 52 y.o. female who presents to Primary Care at [facility] for follow up on HTN. She is fasting today. She requests refill of her lisinopril; denies cough or other side effects. She takes aspirin, Lisinopril, Brilinta, and metoprolol half tablet bid. She hasn't taken Lipitor, and never tried the Wellbutrin. She hasn't needed the nitroglycerin either. Today is her first day back to work. She hasn't had mammogram or colonoscopy yet.   Past Medical History:  Diagnosis Date  . Anemia   . Fibroid   . NSTEMI (non-ST elevated myocardial infarction) ([place])    s/p DES to LCX 07/02/17  . Sarcoidosis    Past Surgical History:  Procedure Laterality Date  . ABDOMINAL HYSTERECTOMY    . CESAREAN SECTION    . CORONARY/GRAFT ACUTE MI REVASCULARIZATION N/A 06/30/2017   Procedure: Coronary/Graft Acute MI Revascularization;  Surgeon: Lorretta Harp, MD;  Location: [facility] [place] [facility];  Service: Cardiovascular;  Laterality: N/A;  . LEFT HEART CATH AND CORONARY ANGIOGRAPHY N/A 06/30/2017   Procedure: LEFT HEART CATH AND CORONARY ANGIOGRAPHY;  Surgeon: Lorretta Harp, MD;  Location: [facility];  Service: Cardiovascular;  Laterality: N/A;   Prior to Admission medications   Medication Sig Start Date End Date Taking? Authorizing Provider  aspirin EC 81 MG tablet Take 81 mg by mouth daily.    [provider]  atorvastatin (LIPITOR) 80 MG tablet Take 1 tablet (80 mg total) by mouth daily. 11/12/17 02/10/18  Lorretta Harp, MD  buPROPion (WELLBUTRIN XL) 150 MG 24  hr tablet Take 1 tablet (150 mg total) by mouth daily. X 2 weeks, then 2 tabs a day. Call for refill so higher dose can be sent in 07/06/17   Shawnee Knapp, MD  lisinopril (PRINIVIL,ZESTRIL) 2.5 MG tablet TAKE 1 TABLET BY MOUTH EVERY DAY 10/28/17   Shawnee Knapp, MD  metoprolol tartrate (LOPRESSOR) 25 MG tablet Take 0.5 tablets (12.5 mg total) by mouth 2 (two) times daily. 07/04/17   Tommie Raymond, NP  nitroGLYCERIN (NITROSTAT) 0.4 MG SL tablet Place 1 tablet (0.4 mg total) under the tongue every 5 (five) minutes x 3 doses as needed for chest pain. 07/04/17   Kathyrn Drown D, NP  ticagrelor (BRILINTA) 90 MG TABS tablet Take 1 tablet (90 mg total) by mouth 2 (two) times daily. 07/04/17   Tommie Raymond, NP   Allergies  Allergen Reactions  . Hydrocodone Hives and Itching   Family History  Problem Relation Age of Onset  . Diabetes Mother   . Stroke Mother 39  . Hypertension Mother   . Hyperlipidemia Father   . Stroke Father 47  . Hypertension Father    Social History   Socioeconomic History  . Marital status: Single    Spouse name: Not on file  . Number of children: 2  . Years of education: Not on file  . Highest education level: Not on file  Occupational History  . Occupation: Scientist, forensic  Employer: [employer]  Social Needs  . Financial resource strain: Not on file  . Food insecurity:    Worry: Not on file    Inability: Not on file  . Transportation needs:    Medical: Not on file    Non-medical: Not on file  Tobacco Use  . Smoking status: Current Every Day Smoker    Packs/day: 0.25    Years: 20.00    Pack years: 5.00    Types: Cigarettes  . Smokeless tobacco: Never Used  . Tobacco comment: down to 4 cigarettes per day   Substance and Sexual Activity  . Alcohol use: No  . Drug use: No  . Sexual activity: Yes    Birth control/protection: None  Lifestyle  . Physical activity:    Days per week: Not on file    Minutes per session: Not on file  .  Stress: Not on file  Relationships  . Social connections:    Talks on phone: Not on file    Gets together: Not on file    Attends religious service: Not on file    Active member of club or organization: Not on file    Attends meetings of clubs or organizations: Not on file    Relationship status: Not on file  Other Topics Concern  . Not on file  Social History Narrative  . Not on file   Depression screen [facility] 2/9 12/02/2017 09/18/2017 09/18/2017 07/06/2017 04/24/2017  Decreased Interest 0 0 0 0 0  Down, Depressed, Hopeless 0 1 0 0 0  PHQ - 2 Score 0 1 0 0 0    Review of Systems  Constitutional: Negative for chills, fatigue, fever and unexpected weight change.  Respiratory: Negative for cough.   Gastrointestinal: Negative for constipation, diarrhea, nausea and vomiting.  Skin: Negative for rash and wound.  Neurological: Negative for dizziness, weakness and headaches.       Objective:   Physical Exam  Constitutional: She is oriented to person, place, and time. She appears well-developed and well-nourished. No distress.  HENT:  Head: Normocephalic and atraumatic.  Eyes: Pupils are equal, round, and reactive to light. EOM are normal.  Neck: Neck supple.  Cardiovascular: Normal rate, regular rhythm, S1 normal, S2 normal and normal heart sounds.  No murmur heard. Pulmonary/Chest: Effort normal and breath sounds normal. No respiratory distress.  Musculoskeletal: Normal range of motion.  Neurological: She is alert and oriented to person, place, and time.  Skin: Skin is warm and dry.  Psychiatric: She has a normal mood and affect. Her behavior is normal.  Nursing note and vitals reviewed.   BP 123/82 (BP Location: Right Arm, Patient Position: Sitting, Cuff Size: Normal)   Pulse 82   Temp 98 F (36.7 C) (Oral)   Resp 17   Ht 5\' 4"  (1.626 m)   Wt 177 lb 9.6 oz (80.6 kg)   SpO2 100%   BMI 30.48 kg/m      Assessment & Plan:   1. Hyperlipidemia with target LDL less than 70     2. Encounter for screening mammogram for breast cancer   3. Screening for colorectal cancer   4. Coronary artery disease involving native coronary artery of native heart with unstable angina pectoris ([place])     Orders Placed This Encounter  Procedures  . Lipid panel    Order Specific Question:   Has the patient fasted?    Answer:   Yes  . Comprehensive metabolic panel    Order  Specific Question:   Has the patient fasted?    Answer:   Yes  . Ambulatory referral to Gastroenterology    Referral Priority:   Routine    Referral Type:   Consultation    Referral Reason:   Specialty Services Required    Number of Visits Requested:   1    Meds ordered this encounter  Medications  . lisinopril (PRINIVIL,ZESTRIL) 2.5 MG tablet    Sig: Take 1 tablet (2.5 mg total) by mouth daily.    Dispense:  90 tablet    Refill:  3    Needs office visit for add'l refills  . atorvastatin (LIPITOR) 80 MG tablet    Sig: Take 1 tablet (80 mg total) by mouth daily.    Dispense:  90 tablet    Refill:  3  . metoprolol tartrate (LOPRESSOR) 25 MG tablet    Sig: Take 0.5 tablets (12.5 mg total) by mouth 2 (two) times daily.    Dispense:  90 tablet    Refill:  2   I personally performed the services described in this documentation, which was scribed in my presence. The recorded information has been reviewed and considered, and addended by me as needed.   [name], M.D.  Primary Care at [facility] [address] [place], [place] [address] [phone] phone [phone] fax  03/23/18 4:06 PM

## 2017-12-02 NOTE — Patient Instructions (Signed)
     IF you received an x-ray today, you will receive an invoice from [facility]. Please contact [facility] at [phone] with questions or concerns regarding your invoice.   IF you received labwork today, you will receive an invoice from [facility]. Please contact [facility] at [phone] with questions or concerns regarding your invoice.   Our billing staff will not be able to assist you with questions regarding bills from these companies.  You will be contacted with the lab results as soon as they are available. The fastest way to get your results is to activate your My Chart account. Instructions are located on the last page of this paperwork. If you have not heard from us regarding the results in 2 weeks, please contact this office.     

## 2017-12-04 ENCOUNTER — Encounter (HOSPITAL_COMMUNITY): Payer: Commercial Managed Care - PPO

## 2017-12-05 ENCOUNTER — Ambulatory Visit: Payer: Self-pay | Admitting: Physician Assistant

## 2017-12-06 ENCOUNTER — Ambulatory Visit: Payer: Self-pay | Admitting: Physician Assistant

## 2017-12-06 ENCOUNTER — Encounter (HOSPITAL_COMMUNITY): Payer: Commercial Managed Care - PPO

## 2017-12-09 ENCOUNTER — Encounter (HOSPITAL_COMMUNITY): Payer: Commercial Managed Care - PPO

## 2017-12-11 ENCOUNTER — Encounter (HOSPITAL_COMMUNITY): Payer: Commercial Managed Care - PPO

## 2017-12-13 ENCOUNTER — Encounter (HOSPITAL_COMMUNITY): Payer: Commercial Managed Care - PPO

## 2017-12-20 ENCOUNTER — Other Ambulatory Visit: Payer: Self-pay

## 2017-12-20 ENCOUNTER — Emergency Department (HOSPITAL_COMMUNITY): Payer: Commercial Managed Care - PPO

## 2017-12-20 ENCOUNTER — Ambulatory Visit (INDEPENDENT_AMBULATORY_CARE_PROVIDER_SITE_OTHER): Payer: Commercial Managed Care - PPO | Admitting: Physician Assistant

## 2017-12-20 ENCOUNTER — Observation Stay (HOSPITAL_COMMUNITY)
Admission: EM | Admit: 2017-12-20 | Discharge: 2017-12-24 | Disposition: A | Discharge status: Home / Self Care | Payer: Commercial Managed Care - PPO | Attending: Internal Medicine | Admitting: Internal Medicine

## 2017-12-20 ENCOUNTER — Encounter (HOSPITAL_COMMUNITY): Payer: Self-pay

## 2017-12-20 ENCOUNTER — Encounter: Payer: Self-pay | Admitting: Physician Assistant

## 2017-12-20 ENCOUNTER — Telehealth: Payer: Self-pay | Admitting: Family Medicine

## 2017-12-20 ENCOUNTER — Other Ambulatory Visit: Payer: Self-pay | Admitting: Family Medicine

## 2017-12-20 ENCOUNTER — Encounter (HOSPITAL_COMMUNITY): Payer: Self-pay | Admitting: General Practice

## 2017-12-20 VITALS — BP 131/86 | HR 87 | Temp 98.1°F | Resp 20 | Ht 65.16 in | Wt 171.8 lb

## 2017-12-20 DIAGNOSIS — Z955 Presence of coronary angioplasty implant and graft: Secondary | ICD-10-CM | POA: Diagnosis not present

## 2017-12-20 DIAGNOSIS — Z8249 Family history of ischemic heart disease and other diseases of the circulatory system: Secondary | ICD-10-CM | POA: Insufficient documentation

## 2017-12-20 DIAGNOSIS — E782 Mixed hyperlipidemia: Secondary | ICD-10-CM | POA: Diagnosis not present

## 2017-12-20 DIAGNOSIS — R079 Chest pain, unspecified: Secondary | ICD-10-CM | POA: Diagnosis not present

## 2017-12-20 DIAGNOSIS — R0789 Other chest pain: Secondary | ICD-10-CM | POA: Diagnosis not present

## 2017-12-20 DIAGNOSIS — F17210 Nicotine dependence, cigarettes, uncomplicated: Secondary | ICD-10-CM

## 2017-12-20 DIAGNOSIS — E785 Hyperlipidemia, unspecified: Secondary | ICD-10-CM | POA: Diagnosis not present

## 2017-12-20 DIAGNOSIS — D869 Sarcoidosis, unspecified: Secondary | ICD-10-CM | POA: Diagnosis not present

## 2017-12-20 DIAGNOSIS — Z7982 Long term (current) use of aspirin: Secondary | ICD-10-CM | POA: Insufficient documentation

## 2017-12-20 DIAGNOSIS — I2510 Atherosclerotic heart disease of native coronary artery without angina pectoris: Secondary | ICD-10-CM | POA: Diagnosis present

## 2017-12-20 DIAGNOSIS — I25110 Atherosclerotic heart disease of native coronary artery with unstable angina pectoris: Secondary | ICD-10-CM | POA: Diagnosis not present

## 2017-12-20 DIAGNOSIS — I252 Old myocardial infarction: Secondary | ICD-10-CM | POA: Diagnosis not present

## 2017-12-20 DIAGNOSIS — I249 Acute ischemic heart disease, unspecified: Secondary | ICD-10-CM

## 2017-12-20 DIAGNOSIS — Z885 Allergy status to narcotic agent status: Secondary | ICD-10-CM | POA: Insufficient documentation

## 2017-12-20 DIAGNOSIS — F17200 Nicotine dependence, unspecified, uncomplicated: Secondary | ICD-10-CM

## 2017-12-20 DIAGNOSIS — Z79899 Other long term (current) drug therapy: Secondary | ICD-10-CM | POA: Insufficient documentation

## 2017-12-20 DIAGNOSIS — Z823 Family history of stroke: Secondary | ICD-10-CM | POA: Insufficient documentation

## 2017-12-20 DIAGNOSIS — R0602 Shortness of breath: Secondary | ICD-10-CM | POA: Diagnosis not present

## 2017-12-20 HISTORY — DX: Hyperlipidemia, unspecified: E78.5

## 2017-12-20 LAB — BASIC METABOLIC PANEL
Anion gap: 7 (ref 5–15)
BUN: 9 mg/dL (ref 6–20)
CHLORIDE: 110 mmol/L (ref 98–111)
CO2: 22 mmol/L (ref 22–32)
Calcium: 9.8 mg/dL (ref 8.9–10.3)
Creatinine, Ser: 0.87 mg/dL (ref 0.44–1.00)
GFR calc non Af Amer: >60 mL/min (ref >60)
Glucose, Bld: 92 mg/dL (ref 70–99)
POTASSIUM: 4 mmol/L (ref 3.5–5.1)
SODIUM: 139 mmol/L (ref 135–145)

## 2017-12-20 LAB — I-STAT TROPONIN, ED: Troponin i, poc: 0 ng/mL (ref 0.00–0.08)

## 2017-12-20 LAB — CBC
HEMATOCRIT: 38.6 % (ref 36.0–46.0)
Hemoglobin: 12.8 g/dL (ref 12.0–15.0)
MCH: 30 pg (ref 26.0–34.0)
MCHC: 33.2 g/dL (ref 30.0–36.0)
MCV: 90.4 fL (ref 78.0–100.0)
Platelets: 322 10*3/uL (ref 150–400)
RBC: 4.27 MIL/uL (ref 3.87–5.11)
RDW: 13.2 % (ref 11.5–15.5)
WBC: 7.1 10*3/uL (ref 4.0–10.5)

## 2017-12-20 LAB — TROPONIN I

## 2017-12-20 MED ORDER — ONDANSETRON HCL 4 MG/2ML IJ SOLN: 4.0000 mg | Freq: Four times a day (QID) | INTRAMUSCULAR | Status: DC | PRN

## 2017-12-20 MED ORDER — NITROGLYCERIN 0.4 MG SL SUBL
0.4000 mg | SUBLINGUAL_TABLET | SUBLINGUAL | Status: DC | PRN
  Administered 2017-12-20: 0.4 mg via SUBLINGUAL
  Filled 2017-12-20: qty 1

## 2017-12-20 MED ORDER — MORPHINE SULFATE (PF) 2 MG/ML IV SOLN
2.0000 mg | Freq: Once | INTRAVENOUS | Status: AC
  Administered 2017-12-20: 2 mg via INTRAVENOUS
  Filled 2017-12-20: qty 1

## 2017-12-20 MED ORDER — ENOXAPARIN SODIUM 40 MG/0.4ML ~~LOC~~ SOLN
40.0000 mg | SUBCUTANEOUS | Status: DC
  Administered 2017-12-20 – 2017-12-23 (×4): 40 mg via SUBCUTANEOUS
  Filled 2017-12-20 (×4): qty 0.4

## 2017-12-20 MED ORDER — TICAGRELOR 90 MG PO TABS
90.0000 mg | ORAL_TABLET | Freq: Two times a day (BID) | ORAL | Status: DC
  Administered 2017-12-20 – 2017-12-23 (×6): 90 mg via ORAL
  Filled 2017-12-20 (×6): qty 1

## 2017-12-20 MED ORDER — ATORVASTATIN CALCIUM 80 MG PO TABS
80.0000 mg | ORAL_TABLET | Freq: Every day | ORAL | Status: DC
  Administered 2017-12-21 – 2017-12-23 (×3): 80 mg via ORAL
  Filled 2017-12-20 (×3): qty 4

## 2017-12-20 MED ORDER — ASPIRIN EC 81 MG PO TBEC
81.0000 mg | DELAYED_RELEASE_TABLET | Freq: Every day | ORAL | Status: DC
  Administered 2017-12-21 – 2017-12-22 (×2): 81 mg via ORAL
  Filled 2017-12-20 (×3): qty 1

## 2017-12-20 MED ORDER — ACETAMINOPHEN 325 MG PO TABS: 650.0000 mg | ORAL_TABLET | ORAL | Status: DC | PRN

## 2017-12-20 MED ORDER — ASPIRIN 81 MG PO CHEW: 243.0000 mg | CHEWABLE_TABLET | Freq: Once | ORAL | Status: DC

## 2017-12-20 NOTE — ED Triage Notes (Signed)
Pt brought in by [facility] from her PCP for CP and SOB that started last night around 2000. Pt endorses radiation to her neck, right arm numbness, and right sided CP. Pt has hx of nSTEMI in January 2019. Pt went to see her PCP today for evaluation of CP, per PCP pt EKG was normal but wanted pt evaluated further d/t hx. Pt describes pain on right side as "pinching". Pt given 324 mg aspirin PTA. Pt did not take her home nitro. Pt is a current every day smoker. Pt A+Ox4 on arrival.

## 2017-12-20 NOTE — Progress Notes (Addendum)
[name]  MRN: [id number] DOB: 1965/12/31  Subjective:  [name] is a 52 y.o. female with PMH of obesity, HLD, and NSTEMI in 06/2017 s/p stent placement seen in office today for a chief complaint of chest pain and SOB. Felt sluggish at work yesterday. Developed neck stiffness, SOB, chest tightness. Chest pain is exertional.  Symptoms have been unchanged since that time. The patient describes the pain as tightness and radiates to the upper back and right arm. Patient rates pain as a 8/10 in intensity. Associated symptoms are: dizziness, fatigue and right arm numbness. Aggravating factors are: walking. Alleviating factors are: rest. Patient's cardiac risk factors are: dyslipidemia, family history of premature cardiovascular disease, hypertension, sedentary lifestyle and smoking/ tobacco exposure. Denies calf pain, estrogen replacement therapy, history of deep venous thrombosis, history of pulmonary embolus, history of malignancy, long duration of automobile or plane travel, oral contraceptive use, prolonged stay in bed, recent limb injury or fracture, recent pregnancy, recent surgery and varicose veins. Currently smokes 4 cigarettes a day. No PMH of asthma, COPD, or stroke. Of note, pt has taken aspirin 81 mg already today. Has not taken nitroglycerin.   Review of Systems  Constitutional: Positive for activity change and diaphoresis. Negative for fever and unexpected weight change.  HENT: Negative for sinus pressure and sore throat.   Eyes: Negative for visual disturbance.  Respiratory: Negative for cough and wheezing.   Cardiovascular: Negative for palpitations.  Gastrointestinal: Negative for abdominal pain, nausea and vomiting.  Genitourinary: Negative for decreased urine volume and dysuria.  Musculoskeletal: Positive for neck pain. Negative for back pain.  Neurological: Positive for weakness. Negative for facial asymmetry and speech difficulty.    Patient Active Problem List     Diagnosis Date Noted  . Lung nodule, multiple 07/04/2017  . Post PTCA 07/04/2017  . Abnormal EKG   . Acute chest pain   . Femoral artery hematoma complicating cardiac catheterization 07/02/2017  . Hyperlipidemia with target LDL less than 70 07/02/2017  . Tobacco abuse 07/02/2017  . NSTEMI (non-ST elevated myocardial infarction) ([place])   . Knee LCL sprain 03/20/2017  . Rectal bleeding 10/18/2016  . Special screening for malignant neoplasms, colon 10/18/2016  . Vitamin D deficiency 04/16/2015    Current Outpatient Medications on File Prior to Visit  Medication Sig Dispense Refill  . aspirin EC 81 MG tablet Take 81 mg by mouth daily.    Marland Kitchen atorvastatin (LIPITOR) 80 MG tablet Take 1 tablet (80 mg total) by mouth daily. 90 tablet 3  . lisinopril (PRINIVIL,ZESTRIL) 2.5 MG tablet Take 1 tablet (2.5 mg total) by mouth daily. 90 tablet 3  . metoprolol tartrate (LOPRESSOR) 25 MG tablet Take 0.5 tablets (12.5 mg total) by mouth 2 (two) times daily. 90 tablet 2  . nitroGLYCERIN (NITROSTAT) 0.4 MG SL tablet Place 1 tablet (0.4 mg total) under the tongue every 5 (five) minutes x 3 doses as needed for chest pain. 25 tablet 0  . ticagrelor (BRILINTA) 90 MG TABS tablet Take 1 tablet (90 mg total) by mouth 2 (two) times daily. 180 tablet 2   No current facility-administered medications on file prior to visit.     Allergies  Allergen Reactions  . Hydrocodone Hives and Itching   Social History   Socioeconomic History  . Marital status: Single    Spouse name: Not on file  . Number of children: 2  . Years of education: Not on file  . Highest education level: Not on file  Occupational  History  . Occupation: Chiropodist: [employer]  . Financial resource strain: Not on file  . Food insecurity:    Worry: Not on file    Inability: Not on file  . Transportation needs:    Medical: Not on file    Non-medical: Not on file  Tobacco Use  . Smoking  status: Current Every Day Smoker    Packs/day: 0.25    Years: 20.00    Pack years: 5.00    Types: Cigarettes  . Smokeless tobacco: Never Used  . Tobacco comment: down to 4 cigarettes per day   Substance and Sexual Activity  . Alcohol use: No  . Drug use: No  . Sexual activity: Yes    Birth control/protection: None  Lifestyle  . Physical activity:    Days per week: Not on file    Minutes per session: Not on file  . Stress: Not on file  Relationships  . Social connections:    Talks on phone: Not on file    Gets together: Not on file    Attends religious service: Not on file    Active member of club or organization: Not on file    Attends meetings of clubs or organizations: Not on file    Relationship status: Not on file  . Intimate partner violence:    Fear of current or ex partner: Not on file    Emotionally abused: Not on file    Physically abused: Not on file    Forced sexual activity: Not on file  Other Topics Concern  . Not on file  Social History Narrative  . Not on file      Objective:   BP 131/86 (BP Location: Right Arm, Patient Position: Sitting, Cuff Size: Normal)   Pulse 87   Temp 98.1 F (36.7 C) (Oral)   Resp 20   Ht 5' 5.16" (1.655 m)   Wt 171 lb 12.8 oz (77.9 kg)   SpO2 100%   BMI 28.45 kg/m   Physical Exam  Constitutional: She is oriented to person, place, and time. She appears well-developed and well-nourished.  Appears as if she does not feel well lying on exam table.   HENT:  Head: Normocephalic and atraumatic.  Mouth/Throat: Uvula is midline, oropharynx is clear and moist and mucous membranes are normal.  Eyes: Pupils are equal, round, and reactive to light. Conjunctivae and EOM are normal.  Neck: Normal range of motion. Muscular tenderness ( with palpation of right sided musculature) present. No spinous process tenderness present. No neck rigidity. Normal range of motion present.  Cardiovascular: Normal rate, regular rhythm, normal heart  sounds and intact distal pulses.  Pulmonary/Chest: Effort normal and breath sounds normal. She has no wheezes. She has no rhonchi. She has no rales. She exhibits no tenderness and no bony tenderness.  Musculoskeletal:       Right lower leg: She exhibits no swelling.       Left lower leg: She exhibits no swelling.  Neurological: She is alert and oriented to person, place, and time. She has normal strength. No cranial nerve deficit or sensory deficit.  Skin: Skin is warm and dry.  Psychiatric: She has a normal mood and affect.  Vitals reviewed.  BP Readings from Last 3 Encounters:  12/20/17 131/86  12/02/17 123/82  11/12/17 118/81   EKG shows sinus rhythm with rate of 70bpm. Slight ST elevation noted in leads V1 and V2, similar to  prior EKG tracings.  Findings presented and discussed with Dr. Tamala Julian.    Assessment and Plan :  This case was precepted with Dr. Tamala Julian. Due to hx and presenting sx of SOB and exertional chest pain radiating to right arm, which is non reproducible on exam, rec emergent evaluation by ED. EMS contacted and pt transferred emergently to ED.   1. Chest pain in adult - EKG 12-Lead 2. Shortness of breath 3. History of non-ST elevation myocardial infarction (NSTEMI) 4. Tobacco use disorder 5. Hyperlipidemia with target LDL less than 70  Meds ordered this encounter  Medications  . aspirin chewable tablet 243 mg      Tenna Delaine PA-C  [facility] 12/20/2017 4:30 PM

## 2017-12-20 NOTE — Patient Instructions (Signed)
     IF you received an x-ray today, you will receive an invoice from [facility]. Please contact [facility] at [phone] with questions or concerns regarding your invoice.   IF you received labwork today, you will receive an invoice from [facility]. Please contact [facility] at [phone] with questions or concerns regarding your invoice.   Our billing staff will not be able to assist you with questions regarding bills from these companies.  You will be contacted with the lab results as soon as they are available. The fastest way to get your results is to activate your My Chart account. Instructions are located on the last page of this paperwork. If you have not heard from us regarding the results in 2 weeks, please contact this office.     

## 2017-12-20 NOTE — Telephone Encounter (Signed)
Pt. Came into office today for their appt. And wanted to let the doctor know the location she had been referred to for a mammogram told her she needed a different type of mammogram. Pt. Could not specify type

## 2017-12-20 NOTE — Progress Notes (Signed)
Patient complaining of 5/10 right sided chest tightness with radiation down right arm.  EKG done.  Patient had sublingual nitroglycerin in ED without complete relief.  [facility] paged with this information.

## 2017-12-20 NOTE — ED Provider Notes (Signed)
[facility] EMERGENCY DEPARTMENT Provider Note   CSN: [id number] Arrival date & time: 12/20/17  1743     History   Chief Complaint Chief Complaint  Patient presents with  . Chest Pain  . Shortness of Breath    HPI [name] is a 52 y.o. female.  HPI   Patient presents today with chest pain.  She has a history of an STEMI in January, states she is compliant with her Brilinta and aspirin.  Follows with Dr. Alvester Chou for cardiology.  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  However, today it is constant, sharp, radiating down her right arm and right neck.  States this feels similar to her previous NSTEMI.  Has not tried nitroglycerin at home.  Went to her PCP today and patient was sent into the emergency room for further evaluation.  Denies fever, recent illness.  States she has shortness of breath and worsening shortness of breath with exertion.  No diaphoresis.  Past Medical History:  Diagnosis Date  . Anemia   . Fibroid   . NSTEMI (non-ST elevated myocardial infarction) ([place])    s/p DES to LCX 07/02/17  . Sarcoidosis     Patient Active Problem List   Diagnosis Date Noted  . Lung nodule, multiple 07/04/2017  . Post PTCA 07/04/2017  . Abnormal EKG   . Acute chest pain   . Femoral artery hematoma complicating cardiac catheterization 07/02/2017  . Hyperlipidemia with target LDL less than 70 07/02/2017  . Tobacco abuse 07/02/2017  . NSTEMI (non-ST elevated myocardial infarction) ([place])   . Knee LCL sprain 03/20/2017  . Rectal bleeding 10/18/2016  . Special screening for malignant neoplasms, colon 10/18/2016  . Vitamin D deficiency 04/16/2015    Past Surgical History:  Procedure Laterality Date  . ABDOMINAL HYSTERECTOMY    . CESAREAN SECTION    . CORONARY/GRAFT ACUTE MI  REVASCULARIZATION N/A 06/30/2017   Procedure: Coronary/Graft Acute MI Revascularization;  Surgeon: Lorretta Harp, MD;  Location: [facility];  Service: Cardiovascular;  Laterality: N/A;  . LEFT HEART CATH AND CORONARY ANGIOGRAPHY N/A 06/30/2017   Procedure: LEFT HEART CATH AND CORONARY ANGIOGRAPHY;  Surgeon: Lorretta Harp, MD;  Location: [place] [facility];  Service: Cardiovascular;  Laterality: N/A;     OB History   None      Home Medications    Prior to Admission medications   Medication Sig Start Date End Date Taking? Authorizing Provider  aspirin EC 81 MG tablet Take 81 mg by mouth daily.    [provider]  atorvastatin (LIPITOR) 80 MG tablet Take 1 tablet (80 mg total) by mouth daily. 12/02/17 03/02/18  Shawnee Knapp, MD  lisinopril (PRINIVIL,ZESTRIL) 2.5 MG tablet Take 1 tablet (2.5 mg total) by mouth daily. 12/02/17   Shawnee Knapp, MD  metoprolol tartrate (LOPRESSOR) 25 MG tablet Take 0.5 tablets (12.5 mg total) by mouth 2 (two) times daily. 12/02/17   Shawnee Knapp, MD  nitroGLYCERIN (NITROSTAT) 0.4 MG SL tablet Place 1 tablet (0.4 mg total) under the tongue every 5 (five) minutes x 3 doses as needed for chest pain. 07/04/17   Kathyrn Drown D, NP  ticagrelor (BRILINTA) 90 MG TABS tablet Take 1 tablet (90 mg total) by mouth 2 (two) times daily. 07/04/17  Tommie Raymond, NP    Family History Family History  Problem Relation Age of Onset  . Diabetes Mother   . Stroke Mother 43  . Hypertension Mother   . Hyperlipidemia Father   . Stroke Father 84  . Hypertension Father     Social History Social History   Tobacco Use  . Smoking status: Current Every Day Smoker    Packs/day: 0.25    Years: 20.00    Pack years: 5.00    Types: Cigarettes  . Smokeless tobacco: Never Used  . Tobacco comment: down to 4 cigarettes per day   Substance Use Topics  . Alcohol use: No  . Drug use: No     Allergies   Hydrocodone   Review of Systems Review of Systems    Constitutional: Negative for appetite change, diaphoresis and fever.  Respiratory: Positive for chest tightness and shortness of breath. Negative for wheezing.   Cardiovascular: Positive for chest pain. Negative for leg swelling.  Gastrointestinal: Negative for abdominal pain.  Neurological: Negative for dizziness.  All other systems reviewed and are negative.    Physical Exam Updated Vital Signs BP 104/71   Pulse 67   Temp 97.9 F (36.6 C) (Oral)   Resp 17   Ht 5\' 5"  (1.651 m)   Wt 77.6 kg (171 lb)   SpO2 100%   BMI 28.46 kg/m   Physical Exam  Constitutional: She is oriented to person, place, and time. She appears well-developed and well-nourished. She does not appear ill. No distress.  HENT:  Head: Normocephalic and atraumatic.  Eyes: EOM are normal.  Cardiovascular: Normal rate and regular rhythm.  No murmur heard. Pulmonary/Chest: Effort normal and breath sounds normal.  Abdominal: Soft. Bowel sounds are normal.  Musculoskeletal: Normal range of motion.       Right lower leg: She exhibits no edema.       Left lower leg: She exhibits no edema.  R trapezius TTP. No cervical spine tenderness, normal ROM of R shoulder.  Neurological: She is alert and oriented to person, place, and time.  Skin: Skin is warm and dry. Capillary refill takes less than 2 seconds.  Psychiatric: Her behavior is normal.     ED Treatments / Results  Labs (all labs ordered are listed, but only abnormal results are displayed) Labs Reviewed  BASIC METABOLIC PANEL  CBC  I-STAT TROPONIN, ED    EKG None  Radiology No results found.  Procedures Procedures (including critical care time)  Medications Ordered in ED Medications - No data to display   Initial Impression / Assessment and Plan / ED Course  I have reviewed the triage vital signs and the nursing notes.  Pertinent labs & imaging results that were available during my care of the patient were reviewed by me and considered in  my medical decision making (see chart for details).     Patient with a suspicious story and heart score of 6 initial EKG with what appears to be new T wave changes in the lateral leads. Discussed with cardiology on-call, they state patient should be admitted to medicine for further work-up and they would like hospital team to re consult if they feel it is appropriate.  Patient unassigned for admission.    D/w TRH, they will admit and discuss with cardiology.   Final Clinical Impressions(s) / ED Diagnoses   Final diagnoses:  None    ED Discharge Orders    None       Sela Hilding,  MD 12/20/17 4536    Elnora Morrison, MD 12/20/17 2345

## 2017-12-20 NOTE — H&P (Signed)
History and Physical    [name]:[id number] DOB: 06-15-1965 DOA: 12/20/2017  PCP: Shawnee Knapp, MD  Patient coming from: Home  I have personally briefly reviewed patient's old medical records in [place]  Chief Complaint: CP  HPI: [name] is a 52 y.o. female with medical history significant of CAD s/p DES to LCX in Jan this year.  Stress test in May showed no reversible ischemia, showed fixed inferior wall defect, preserved EF.  Patient presents to the ED with c/o CP.  Had some yesterday.  Today is constant, sharp, radiating down R arm and to R neck.  Similar to prior NSTEMI.  Went to PCP today who sent her in to ED.   ED Course: Trop neg x1.  EKG unchanged from priors (shows inferior and lateral TWI and ST depressions).  CP improved but still present after NTG SL.   Review of Systems: As per HPI otherwise 10 point review of systems negative.   Past Medical History:  Diagnosis Date  . Anemia   . Fibroid   . NSTEMI (non-ST elevated myocardial infarction) ([place])    s/p DES to LCX 07/02/17  . Sarcoidosis     Past Surgical History:  Procedure Laterality Date  . ABDOMINAL HYSTERECTOMY    . CESAREAN SECTION    . CORONARY/GRAFT ACUTE MI REVASCULARIZATION N/A 06/30/2017   Procedure: Coronary/Graft Acute MI Revascularization;  Surgeon: Lorretta Harp, MD;  Location: [place] [facility];  Service: Cardiovascular;  Laterality: N/A;  . LEFT HEART CATH AND CORONARY ANGIOGRAPHY N/A 06/30/2017   Procedure: LEFT HEART CATH AND CORONARY ANGIOGRAPHY;  Surgeon: Lorretta Harp, MD;  Location: [facility];  Service: Cardiovascular;  Laterality: N/A;     reports that she has been smoking cigarettes.  She has a 5.00 pack-year smoking history. She has never used smokeless tobacco. She reports that she does not drink alcohol or use drugs.  Allergies  Allergen Reactions  . Hydrocodone Hives and Itching    Family History  Problem Relation Age of Onset  .  Diabetes Mother   . Stroke Mother 64  . Hypertension Mother   . Hyperlipidemia Father   . Stroke Father 63  . Hypertension Father      Prior to Admission medications   Medication Sig Start Date End Date Taking? Authorizing Provider  aspirin EC 81 MG tablet Take 81 mg by mouth daily.    [provider]  atorvastatin (LIPITOR) 80 MG tablet Take 1 tablet (80 mg total) by mouth daily. 12/02/17 03/02/18  Shawnee Knapp, MD  lisinopril (PRINIVIL,ZESTRIL) 2.5 MG tablet Take 1 tablet (2.5 mg total) by mouth daily. 12/02/17   Shawnee Knapp, MD  metoprolol tartrate (LOPRESSOR) 25 MG tablet Take 0.5 tablets (12.5 mg total) by mouth 2 (two) times daily. 12/02/17   Shawnee Knapp, MD  nitroGLYCERIN (NITROSTAT) 0.4 MG SL tablet Place 1 tablet (0.4 mg total) under the tongue every 5 (five) minutes x 3 doses as needed for chest pain. 07/04/17   Kathyrn Drown D, NP  ticagrelor (BRILINTA) 90 MG TABS tablet Take 1 tablet (90 mg total) by mouth 2 (two) times daily. 07/04/17   Tommie Raymond, NP    Physical Exam: Vitals:   12/20/17 2041 12/20/17 2100 12/20/17 2130 12/20/17 2145  BP: (!) 103/57 107/67 94/73 98/62   Pulse: (!) 57 79 71 66  Resp: 20 20 15 18   Temp:      TempSrc:  SpO2: 100% 100% 98% 100%  Weight:      Height:        Constitutional: NAD, calm, comfortable Eyes: PERRL, lids and conjunctivae normal ENMT: Mucous membranes are moist. Posterior pharynx clear of any exudate or lesions.Normal dentition.  Neck: normal, supple, no masses, no thyromegaly Respiratory: clear to auscultation bilaterally, no wheezing, no crackles. Normal respiratory effort. No accessory muscle use.  Cardiovascular: Regular rate and rhythm, no murmurs / rubs / gallops. No extremity edema. 2+ pedal pulses. No carotid bruits.  Abdomen: no tenderness, no masses palpated. No hepatosplenomegaly. Bowel sounds positive.  Musculoskeletal: no clubbing / cyanosis. No joint deformity upper and lower extremities. Good ROM, no  contractures. Normal muscle tone.  Skin: no rashes, lesions, ulcers. No induration Neurologic: CN 2-12 grossly intact. Sensation intact, DTR normal. Strength 5/5 in all 4.  Psychiatric: Normal judgment and insight. Alert and oriented x 3. Normal mood.    Labs on Admission: I have personally reviewed following labs and imaging studies  CBC: Recent Labs  Lab 12/20/17 1920  WBC 7.1  HGB 12.8  HCT 38.6  MCV 90.4  PLT 277   Basic Metabolic Panel: Recent Labs  Lab 12/20/17 1920  NA 139  K 4.0  CL 110  CO2 22  GLUCOSE 92  BUN 9  CREATININE 0.87  CALCIUM 9.8   GFR: Estimated Creatinine Clearance: 78.7 mL/min (by C-G formula based on SCr of 0.87 mg/dL). Liver Function Tests: No results for input(s): AST, ALT, ALKPHOS, BILITOT, PROT, ALBUMIN in the last 168 hours. No results for input(s): LIPASE, AMYLASE in the last 168 hours. No results for input(s): AMMONIA in the last 168 hours. Coagulation Profile: No results for input(s): INR, PROTIME in the last 168 hours. Cardiac Enzymes: No results for input(s): CKTOTAL, CKMB, CKMBINDEX, TROPONINI in the last 168 hours. BNP (last 3 results) No results for input(s): PROBNP in the last 8760 hours. HbA1C: No results for input(s): HGBA1C in the last 72 hours. CBG: No results for input(s): GLUCAP in the last 168 hours. Lipid Profile: No results for input(s): CHOL, HDL, LDLCALC, TRIG, CHOLHDL, LDLDIRECT in the last 72 hours. Thyroid Function Tests: No results for input(s): TSH, T4TOTAL, FREET4, T3FREE, THYROIDAB in the last 72 hours. Anemia Panel: No results for input(s): VITAMINB12, FOLATE, FERRITIN, TIBC, IRON, RETICCTPCT in the last 72 hours. Urine analysis:    Component Value Date/Time   COLORURINE YELLOW 07/09/2017 1618   APPEARANCEUR HAZY (A) 07/09/2017 1618   LABSPEC 1.010 07/09/2017 1618   PHURINE 7.0 07/09/2017 1618   GLUCOSEU NEGATIVE 07/09/2017 1618   HGBUR SMALL (A) 07/09/2017 1618   BILIRUBINUR NEGATIVE 07/09/2017  1618   BILIRUBINUR negative 10/31/2016 1005   KETONESUR NEGATIVE 07/09/2017 1618   PROTEINUR NEGATIVE 07/09/2017 1618   UROBILINOGEN 0.2 10/31/2016 1005   UROBILINOGEN 1.0 01/28/2015 1329   NITRITE POSITIVE (A) 07/09/2017 1618   LEUKOCYTESUR MODERATE (A) 07/09/2017 1618    Radiological Exams on Admission: Dg Chest 2 View  Result Date: 12/20/2017 CLINICAL DATA:  52 y/o  F; chest pain and shortness of breath today. EXAM: CHEST - 2 VIEW COMPARISON:  06/30/2017 CT chest and chest radiograph FINDINGS: Stable heart size and mediastinal contours are within normal limits. Small pulmonary nodules better characterized on prior CT of chest. No consolidation, effusion, or pneumothorax. The visualized skeletal structures are unremarkable. IMPRESSION: No acute pulmonary process identified. Electronically Signed   By: Kristine Garbe M.D.   On: 12/20/2017 19:50    EKG: Independently reviewed.  Assessment/Plan  Principal Problem:   Chest pain, rule out acute myocardial infarction Active Problems:   CAD (coronary artery disease)    1. CP r/o - 1. CP obs pathway 2. Serial trops 3. Tele monitor 4. NPO after MN 5. Cards eval in AM 2. CAD - 1. Cont statin 2. Cont ASA and brilinta 3. Holding home beta blocker and ACEi since BP on the lower side post NTG  DVT prophylaxis: Lovenox Code Status: Full Family Communication: Family at bedside Disposition Plan: Home after admit Consults called: Message put in to P. Trent for cards eval in AM Admission status: Place in [place], [place] [facility] Pager [phone] Only works nights!  If 7AM-7PM, please contact the primary day team physician taking care of patient  [email or web address] Password [facility]  12/20/2017, 10:15 PM

## 2017-12-21 ENCOUNTER — Encounter (HOSPITAL_COMMUNITY): Payer: Self-pay | Admitting: Student

## 2017-12-21 DIAGNOSIS — I25110 Atherosclerotic heart disease of native coronary artery with unstable angina pectoris: Secondary | ICD-10-CM | POA: Diagnosis not present

## 2017-12-21 DIAGNOSIS — R079 Chest pain, unspecified: Secondary | ICD-10-CM | POA: Diagnosis not present

## 2017-12-21 DIAGNOSIS — I249 Acute ischemic heart disease, unspecified: Secondary | ICD-10-CM | POA: Diagnosis not present

## 2017-12-21 DIAGNOSIS — E785 Hyperlipidemia, unspecified: Secondary | ICD-10-CM | POA: Diagnosis not present

## 2017-12-21 DIAGNOSIS — E782 Mixed hyperlipidemia: Secondary | ICD-10-CM | POA: Diagnosis not present

## 2017-12-21 LAB — TROPONIN I

## 2017-12-21 LAB — MRSA PCR SCREENING: MRSA by PCR: NEGATIVE

## 2017-12-21 MED ORDER — MORPHINE SULFATE (PF) 2 MG/ML IV SOLN
1.0000 mg | INTRAVENOUS | Status: DC | PRN
  Administered 2017-12-21 – 2017-12-22 (×3): 1 mg via INTRAVENOUS
  Filled 2017-12-21 (×3): qty 1

## 2017-12-21 MED ORDER — METOPROLOL TARTRATE 12.5 MG HALF TABLET
12.5000 mg | ORAL_TABLET | Freq: Two times a day (BID) | ORAL | Status: DC
Start: 2017-12-21 — End: 2017-12-24
  Administered 2017-12-21 – 2017-12-24 (×7): 12.5 mg via ORAL
  Filled 2017-12-21 (×7): qty 1

## 2017-12-21 NOTE — Consult Note (Addendum)
Cardiology Consult    Patient ID: LASHONDRA VAQUERANO; [id number]; 08/14/1965   Admit date: 12/20/2017 Date of Consult: 12/21/2017  Primary Care Provider: Shawnee Knapp, MD Primary Cardiologist: Quay Burow, MD   Patient Profile    [name] is a 53 y.o. female with past medical history of CAD (s/p NSTEMI with DES to LCx in 06/2017 with residual 70% LAD stenosis), HLD, pulmonary nodules, and tobacco use who is being seen today for the evaluation of chest pain at the request of Dr. Alcario Drought.   History of Present Illness    [name] was admitted in 06/2017 for an NSTEMI with catheterization showing 70% proximal LAD stenosis, 100% third marginal stenosis, and 90% LCx stenosis with an EF of 45 to 50%. She underwent DES placement to the LCx and was started on ASA and Brilinta.  She has continued to have episodes of chest discomfort since and an NST was performed on 10/31/2017 which showed a medium defect of severe severity present in the mid inferior, mid inferolateral, apical inferior and apical lateral location with findings being consistent with prior MI but no ischemia identified. Was overall an intermediate-risk study.    She was last examined by Dr. Gwenlyn Found on 11/12/2017 and reported intermittent episodes of chest discomfort which was overall thought to be atypical. She continued to smoke but had reduced her use to several cigarettes per day. Had apparently stopped Atorvastatin and Aspirin for unclear reasons and these were restarted at the time of her visit.  The patient was evaluated by her PCP on 12/20/2017 and reported episodes of chest pain and shortness of breath over the past several days. EMS was called and she was transported to [facility] for further evaluation. She reports being in her usual state of health until approximately a few days ago when she developed worsening fatigue. 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 Starting yesterday, she developed a heaviness across her entire pectoral region which she says felt similar to when she required stent placement in 06/2017. Symptoms were worse with exertion but she also reports her pain intensified when she yawned. Pain would subside with rest. Also noted discomfort down her right arm which was similar to her symptoms in 06/2017 as well. Denies any recent dyspnea on exertion, orthopnea, PND, lower extremity edema, or palpitations. She does continue to smoke 4 to 5 cigarettes/day. Reports compliance with ASA and Brilinta.  Initial labs showed WBC 7.1, Hgb 12.8, platelets 322, Na+ 139, K+ 4.0, and creatinine 0.87. Initial and cyclic troponin values have been negative. CXR shows no acute pulmonary processes.  EKG shows normal sinus rhythm, heart rate 67, with slight ST elevation along anterior leads and TWI along inferior and lateral leads which is overall similar to prior tracings.    Past Medical History:  Diagnosis Date  . Anemia   . Fibroid   . Hyperlipidemia   . NSTEMI (non-ST elevated myocardial infarction) ([place]) 06/2017   s/p DES to LCX 07/02/17  . Sarcoidosis     Past Surgical History:  Procedure Laterality Date  . ABDOMINAL HYSTERECTOMY    . CESAREAN SECTION  1987  . CORONARY/GRAFT ACUTE MI REVASCULARIZATION N/A 06/30/2017   Procedure: Coronary/Graft Acute MI Revascularization;  Surgeon: Lorretta Harp, MD;  Location: [place] [facility];  Service: Cardiovascular;  Laterality: N/A;  . FOREARM FRACTURE SURGERY Right 1977  .  FRACTURE SURGERY    . LEFT HEART CATH AND CORONARY ANGIOGRAPHY N/A 06/30/2017   Procedure: LEFT HEART CATH AND CORONARY ANGIOGRAPHY;  Surgeon: Lorretta Harp, MD;  Location: [place] [facility];  Service: Cardiovascular;  Laterality: N/A;  . TUBAL LIGATION  1990     Home Medications:  Prior to Admission medications   Medication Sig Start Date End Date Taking? Authorizing Provider  aspirin EC  81 MG tablet Take 81 mg by mouth daily.   Yes [provider]  atorvastatin (LIPITOR) 80 MG tablet Take 1 tablet (80 mg total) by mouth daily. Patient taking differently: Take 80 mg by mouth at bedtime.  12/02/17 03/02/18 Yes Shawnee Knapp, MD  lisinopril (PRINIVIL,ZESTRIL) 2.5 MG tablet Take 1 tablet (2.5 mg total) by mouth daily. 12/02/17  Yes Shawnee Knapp, MD  metoprolol tartrate (LOPRESSOR) 25 MG tablet Take 0.5 tablets (12.5 mg total) by mouth 2 (two) times daily. 12/02/17  Yes Shawnee Knapp, MD  nitroGLYCERIN (NITROSTAT) 0.4 MG SL tablet Place 1 tablet (0.4 mg total) under the tongue every 5 (five) minutes x 3 doses as needed for chest pain. 07/04/17  Yes Kathyrn Drown D, NP  ticagrelor (BRILINTA) 90 MG TABS tablet Take 1 tablet (90 mg total) by mouth 2 (two) times daily. 07/04/17  Yes Kathyrn Drown D, NP    Inpatient Medications: Scheduled Meds: . aspirin EC  81 mg Oral Daily  . atorvastatin  80 mg Oral Daily  . enoxaparin (LOVENOX) injection  40 mg Subcutaneous Q24H  . metoprolol tartrate  12.5 mg Oral BID  . ticagrelor  90 mg Oral BID   Continuous Infusions:  PRN Meds: acetaminophen, nitroGLYCERIN, ondansetron (ZOFRAN) IV  Allergies:    Allergies  Allergen Reactions  . Hydrocodone Hives and Itching    Social History:   Social History   Socioeconomic History  . Marital status: Widowed    Spouse name: Not on file  . Number of children: 2  . Years of education: Not on file  . Highest education level: Not on file  Occupational History  . Occupation: Chiropodist: [employer]  . Financial resource strain: Not on file  . Food insecurity:    Worry: Not on file    Inability: Not on file  . Transportation needs:    Medical: Not on file    Non-medical: Not on file  Tobacco Use  . Smoking status: Current Every Day Smoker    Packs/day: 1.00    Years: 29.00    Pack years: 29.00    Types: Cigarettes  . Smokeless tobacco:  Never Used  Substance and Sexual Activity  . Alcohol use: Yes    Comment: 12/20/2017 "couple drinks once/yr"  . Drug use: Never  . Sexual activity: Not on file  Lifestyle  . Physical activity:    Days per week: Not on file    Minutes per session: Not on file  . Stress: Not on file  Relationships  . Social connections:    Talks on phone: Not on file    Gets together: Not on file    Attends religious service: Not on file    Active member of club or organization: Not on file    Attends meetings of clubs or organizations: Not on file    Relationship status: Not on file  . Intimate partner violence:    Fear of current or ex partner: Not on file  Emotionally abused: Not on file    Physically abused: Not on file    Forced sexual activity: Not on file  Other Topics Concern  . Not on file  Social History Narrative  . Not on file     Family History:    Family History  Problem Relation Age of Onset  . Diabetes Mother   . Stroke Mother 70  . Hypertension Mother   . Hyperlipidemia Father   . Stroke Father 61  . Hypertension Father       Review of Systems    General:  No chills, fever, night sweats or weight changes. Positive for fatigue.  Cardiovascular:  No dyspnea on exertion, edema, orthopnea, palpitations, paroxysmal nocturnal dyspnea. Positive for chest pain.  Dermatological: No rash, lesions/masses Respiratory: No cough, dyspnea Urologic: No hematuria, dysuria Abdominal:   No nausea, vomiting, diarrhea, bright red blood per rectum, melena, or hematemesis Neurologic:  No visual changes, wkns, changes in mental status. All other systems reviewed and are otherwise negative except as noted above.  Physical Exam/Data    Vitals:   12/20/17 2245 12/20/17 2251 12/21/17 0432 12/21/17 0745  BP:  126/69 102/77 127/71  Pulse:   61 60  Resp:  18 18 16   Temp:  (!) 97.4 F (36.3 C) 97.9 F (36.6 C) (!) 97.5 F (36.4 C)  TempSrc:  Oral Oral Oral  SpO2:  99% 100% 100%    Weight: 173 lb 1.6 oz (78.5 kg)     Height: 5' 4.5" (1.638 m)       Intake/Output Summary (Last 24 hours) at 12/21/2017 0848 Last data filed at 12/20/2017 2330 Gross per 24 hour  Intake 300 ml  Output -  Net 300 ml   Filed Weights   12/20/17 1756 12/20/17 2245  Weight: 171 lb (77.6 kg) 173 lb 1.6 oz (78.5 kg)   Body mass index is 29.25 kg/m.   General: Pleasant, African American female appearing in NAD. Psych: Normal affect. Neuro: Alert and oriented X 3. Moves all extremities spontaneously. HEENT: Normal  Neck: Supple without bruits or JVD. Lungs:  Resp regular and unlabored, CTA without wheezing or rales. Heart: RRR no s3, s4, or murmurs. Abdomen: Soft, non-tender, non-distended, BS + x 4.  Extremities: No clubbing, cyanosis or edema. DP/PT/Radials 2+ and equal bilaterally.   EKG:  The EKG was personally reviewed and demonstrates: NSR, HR 67, with slight ST elevation along anterior leads and TWI along inferior and lateral leads which is overall similar to prior tracings.    Labs/Studies     Relevant CV Studies:  Cardiac Catheterization: 06/30/2017  Prox LAD lesion is 70% stenosed.  3rd Mrg lesion is 100% stenosed.  Prox Cx to Mid Cx lesion is 90% stenosed.  A stent was successfully placed.  Post intervention, there is a 0% residual stenosis.  There is mild to moderate left ventricular systolic dysfunction.  LV end diastolic pressure is normal.  The left ventricular ejection fraction is 45-50% by visual estimate.   IMPRESSION: [name] had an out-of-hospital lateral STEMI probably on Friday with post-MI angina and positive enzymes. Her culprit lesion was a proximal to mid hazy appearing high grade AV groove circumflex. The distal branch was occluded near its termination probably from thromboembolism. She had a excellent endoscopic result from PCI and drug-eluting stenting using a synergy 3 mm x 16 mm long drug-eluting stent. She had preserved LV function  with inferoapical wall hypokinesia and an ejection fraction of approximately 45%. She will require  standard post MI pharmacology including high-dose statin drug, dual antibiotic therapy including aspirin and Brilenta uninterrupted for 12 months, beta blocker and ACE inhibitor. Cardiac risk factor modification will be reinforced. Intractable continued every 4 hours full dose and then will be discontinued. Femoral sheath will be removed after that and pressure held. She did have a small hematoma at the end of the case.  GXT: 10/18/2017 Decreased exercise capacity. Normal blood pressure response to exercise.  This study is un interpretable as baseline ECG is not normal (anterolateral and inferior ST depressions and negative T waves). Consider stress test with imaging such as nuclear stress test or coronary CTA/FFR.   NST: 10/2017  Nuclear stress EF: 60%. No wall motion abnormalities.  The left ventricular ejection fraction is normal (55-65%).  There was no ST segment deviation noted during stress.  Defect 1: There is a medium defect of severe severity present in the mid inferior, mid inferolateral, apical inferior and apical lateral location.  Findings consistent with prior myocardial infarction.  This is an intermediate risk study. No ischemia identified    Laboratory Data:  Chemistry Recent Labs  Lab 12/20/17 1920  NA 139  K 4.0  CL 110  CO2 22  GLUCOSE 92  BUN 9  CREATININE 0.87  CALCIUM 9.8  GFRNONAA >60  GFRAA >60  ANIONGAP 7    No results for input(s): PROT, ALBUMIN, AST, ALT, ALKPHOS, BILITOT in the last 168 hours. Hematology Recent Labs  Lab 12/20/17 1920  WBC 7.1  RBC 4.27  HGB 12.8  HCT 38.6  MCV 90.4  MCH 30.0  MCHC 33.2  RDW 13.2  PLT 322   Cardiac Enzymes Recent Labs  Lab 12/20/17 2115 12/20/17 2321 12/21/17 0028 12/21/17 0428  TROPONINI <0.03 <0.03 <0.03 <0.03    Recent Labs  Lab 12/20/17 1932  TROPIPOC 0.00    BNPNo results for  input(s): BNP, PROBNP in the last 168 hours.  DDimer No results for input(s): DDIMER in the last 168 hours.  Radiology/Studies:  Dg Chest 2 View  Result Date: 12/20/2017 CLINICAL DATA:  52 y/o  F; chest pain and shortness of breath today. EXAM: CHEST - 2 VIEW COMPARISON:  06/30/2017 CT chest and chest radiograph FINDINGS: Stable heart size and mediastinal contours are within normal limits. Small pulmonary nodules better characterized on prior CT of chest. No consolidation, effusion, or pneumothorax. The visualized skeletal structures are unremarkable. IMPRESSION: No acute pulmonary process identified. Electronically Signed   By: Kristine Garbe M.D.   On: 12/20/2017 19:50     Assessment & Plan    1. Chest Pain with Mixed Typical and Atypical Features - the patient was admitted for ACS in 06/2017 and continued to have symptoms as an outpatient which led to repeat NST in 10/2017 which showed prior infarct but no significant ischemia. She presents with worsening fatigue and new-onset chest pressure which started yesterday. Symptoms have mixed features as she reports the pain was worse with exertion and would resolve with rest, however the pain was at times exacerbated with yawning. She is tender to palpation along her right chest today but says this feels different than her presenting symptoms. Has noted intermittent pain which has radiated down her right arm which correlates with her symptoms in 06/2017. - Initial and cyclic troponin values have been negative. EKG shows NSR, HR 67, with slight ST elevation along anterior leads and TWI along inferior and lateral leads which is overall similar to prior tracings.  - would not pursue  a repeat NST given her recent testing in 10/2017. Not a candidate for Coronary CT given prior stent placement. With her residual 70% Proximal LAD stenosis, continued tobacco use, and presenting symptoms a repeat cardiac catheterization is likely our best option for  ischemic evaluation as her soft BP limits further titration of her medication regimen. Will review with MD.  - continue ASA, Brilinta, BB, and statin therapy.   2. CAD - s/p DES to LCx in 06/2017 as outlined in cath report above. Initial and cyclic troponin values have been negative with EKG showing no acute ischemic changes when compared to prior tracings.  - continue ASA, Brilinta, statin, and BB therapy.  3. HLD - FLP in 11/2017 showed total cholesterol of 112, HDL 37, and LDL 54. At goal of LDL < 70. - continue Atorvastatin 80mg  daily.  4. Tobacco Use - she continues to smoke 4-5 cigarettes per day. Complete cessation advised.    For questions or updates, please contact [place] Please consult [email or web address] for contact info under Cardiology/STEMI.  Signed, Erma Heritage, PA-C 12/21/2017, 8:48 AM Pager: [phone]   Attending note:  Patient seen and examined.  I reviewed her records including cardiac diagnostic testing back through January of this year.  She has a history of NSTEMI with placement of DES to the circumflex in January, otherwise residual disease in the LAD and obtuse marginal system managed medically.  She presents with recurring episodes of chest discomfort that have both typical and atypical features.  She did undergo a Myoview in May which revealed evidence of scar predominantly in the inferolateral and apical lateral distribution with normal LVEF.  Despite medical therapy she continues to have recurring chest pain and is readmitted to the hospital.  She appears comfortable this morning.  Systolic blood pressure in the 100-120 range, heart rate in the 60s to 70s in sinus rhythm by telemetry which I personally reviewed.  Lungs are clear without labored breathing.  Cardiac exam reveals RRR without gallop.  Lab work shows negative troponin I levels less than 0.03, creatinine 0.87, potassium 4.0, LDL 54, hemoglobin 12.8.  I personally reviewed her ECG which  shows sinus rhythm with borderline low voltage and diffuse nonspecific ST-T changes.  Chest x-ray shows a stable, small pulmonary nodules with no acute process.  Patient presents with recurring chest pain, mixed typical and atypical features for unstable angina but negative troponin I levels and overall nonspecific ST-T wave abnormalities by ECG.  Given history of NSTEMI with DES intervention to the circumflex in January and otherwise residual disease including a 70% proximal LAD stenosis, plan is to pursue a follow-up diagnostic cardiac catheterization to reevaluate coronary anatomy.  She is otherwise on good medical therapy including aspirin, Brilinta, Lipitor, and Lopressor.  Risks and benefits discussed with the patient and she is in agreement to proceed.  Satira Sark, M.D., F.A.C.C.

## 2017-12-21 NOTE — Progress Notes (Signed)
PROGRESS NOTE    [name]  YIA:[id number] DOB: [birth year] DOA: 12/20/2017 PCP: Shawnee Knapp, MD  Brief [place] is a 52 y.o. female with medical history significant of CAD s/p DES to LCX in Jan this year.  Stress test in May showed no reversible ischemia, showed fixed inferior wall defect, preserved EF. Patient presented to the ED with c/o CP.   Assessment & Plan:     Unstable angina/known CAD -History of drug-eluting stent to circumflex in 06/2017 -Had a 70% LAD lesion at the time -troponins negative -Continue aspirin, brilinta, beta blocker, statin -Cardiology consulting, plan for left heart catheterization on Monday   remote history of sarcoidosis -Briefly treated with steroids in [place] 15-20 years ago -Refer to pulmonary as outpatient  Tobacco abuse -Counseled   DVT prophylaxis:Lovenox Code Status: Full code Family Communication:no family at bedside Disposition Plan: home pending cardiac catheterization  Consultants:   cardiology   Procedures:   Antimicrobials:    Subjective: -denies any chest pain at this time  Objective: Vitals:   12/21/17 0745 12/21/17 0916 12/21/17 1139 12/21/17 1139  BP: 127/71 109/68 112/73 112/73  Pulse: 60 76 69 70  Resp: 16     Temp: (!) 97.5 F (36.4 C)  97.8 F (36.6 C) 97.8 F (36.6 C)  TempSrc: Oral  Oral Oral  SpO2: 100%  100%   Weight:      Height:        Intake/Output Summary (Last 24 hours) at 12/21/2017 1206 Last data filed at 12/21/2017 0800 Gross per 24 hour  Intake 480 ml  Output -  Net 480 ml   Filed Weights   12/20/17 1756 12/20/17 2245  Weight: 77.6 kg (171 lb) 78.5 kg (173 lb 1.6 oz)    Examination:  General exam: Appears calm and comfortable  Respiratory system: Clear to auscultation. Respiratory effort normal. Cardiovascular system: S1 & S2 heard, RRR.  Gastrointestinal system: Abdomen is nondistended, soft and nontender.Normal bowel sounds heard. Central nervous  system: Alert and oriented. No focal neurological deficits. Extremities: Symmetric 5 x 5 power. Skin: No rashes, lesions or ulcers Psychiatry: Judgement and insight appear normal. Mood & affect appropriate.     Data Reviewed:   CBC: Recent Labs  Lab 12/20/17 1920  WBC 7.1  HGB 12.8  HCT 38.6  MCV 90.4  PLT 707   Basic Metabolic Panel: Recent Labs  Lab 12/20/17 1920  NA 139  K 4.0  CL 110  CO2 22  GLUCOSE 92  BUN 9  CREATININE 0.87  CALCIUM 9.8   GFR: Estimated Creatinine Clearance: 78.4 mL/min (by C-G formula based on SCr of 0.87 mg/dL). Liver Function Tests: No results for input(s): AST, ALT, ALKPHOS, BILITOT, PROT, ALBUMIN in the last 168 hours. No results for input(s): LIPASE, AMYLASE in the last 168 hours. No results for input(s): AMMONIA in the last 168 hours. Coagulation Profile: No results for input(s): INR, PROTIME in the last 168 hours. Cardiac Enzymes: Recent Labs  Lab 12/20/17 2115 12/20/17 2321 12/21/17 0028 12/21/17 0428  TROPONINI <0.03 <0.03 <0.03 <0.03   BNP (last 3 results) No results for input(s): PROBNP in the last 8760 hours. HbA1C: No results for input(s): HGBA1C in the last 72 hours. CBG: No results for input(s): GLUCAP in the last 168 hours. Lipid Profile: No results for input(s): CHOL, HDL, LDLCALC, TRIG, CHOLHDL, LDLDIRECT in the last 72 hours. Thyroid Function Tests: No results for input(s): TSH, T4TOTAL, FREET4, T3FREE, THYROIDAB in the last 72 hours.  Anemia Panel: No results for input(s): VITAMINB12, FOLATE, FERRITIN, TIBC, IRON, RETICCTPCT in the last 72 hours. Urine analysis:    Component Value Date/Time   COLORURINE YELLOW 07/09/2017 1618   APPEARANCEUR HAZY (A) 07/09/2017 1618   LABSPEC 1.010 07/09/2017 1618   PHURINE 7.0 07/09/2017 1618   GLUCOSEU NEGATIVE 07/09/2017 1618   HGBUR SMALL (A) 07/09/2017 1618   BILIRUBINUR NEGATIVE 07/09/2017 1618   BILIRUBINUR negative 10/31/2016 1005   KETONESUR NEGATIVE 07/09/2017  1618   PROTEINUR NEGATIVE 07/09/2017 1618   UROBILINOGEN 0.2 10/31/2016 1005   UROBILINOGEN 1.0 01/28/2015 1329   NITRITE POSITIVE (A) 07/09/2017 1618   LEUKOCYTESUR MODERATE (A) 07/09/2017 1618   Sepsis Labs: @LABRCNTIP (procalcitonin:4,lacticidven:4)  ) Recent Results (from the past 240 hour(s))  MRSA PCR Screening     Status: None   Collection Time: 12/20/17 11:17 PM  Result Value Ref Range Status   MRSA by PCR NEGATIVE NEGATIVE Final    Comment:        The GeneXpert MRSA Assay (FDA approved for NASAL specimens only), is one component of a comprehensive MRSA colonization surveillance program. It is not intended to diagnose MRSA infection nor to guide or monitor treatment for MRSA infections. Performed at [facility], [place] [address], [place], [place] [address]          Radiology Studies: Dg Chest 2 View  Result Date: 12/20/2017 CLINICAL DATA:  52 y/o  F; chest pain and shortness of breath today. EXAM: CHEST - 2 VIEW COMPARISON:  06/30/2017 CT chest and chest radiograph FINDINGS: Stable heart size and mediastinal contours are within normal limits. Small pulmonary nodules better characterized on prior CT of chest. No consolidation, effusion, or pneumothorax. The visualized skeletal structures are unremarkable. IMPRESSION: No acute pulmonary process identified. Electronically Signed   By: Kristine Garbe M.D.   On: 12/20/2017 19:50        Scheduled Meds: . aspirin EC  81 mg Oral Daily  . atorvastatin  80 mg Oral Daily  . enoxaparin (LOVENOX) injection  40 mg Subcutaneous Q24H  . metoprolol tartrate  12.5 mg Oral BID  . ticagrelor  90 mg Oral BID   Continuous Infusions:   LOS: 0 days    Time spent: 5min    Domenic Polite, MD [facility] Page via [email or web address], password [email or web address] After 7PM please contact night-coverage  12/21/2017, 12:07 PM

## 2017-12-22 DIAGNOSIS — Z720 Tobacco use: Secondary | ICD-10-CM | POA: Diagnosis not present

## 2017-12-22 DIAGNOSIS — E782 Mixed hyperlipidemia: Secondary | ICD-10-CM | POA: Diagnosis not present

## 2017-12-22 DIAGNOSIS — I25119 Atherosclerotic heart disease of native coronary artery with unspecified angina pectoris: Secondary | ICD-10-CM

## 2017-12-22 DIAGNOSIS — I25110 Atherosclerotic heart disease of native coronary artery with unstable angina pectoris: Secondary | ICD-10-CM | POA: Diagnosis not present

## 2017-12-22 DIAGNOSIS — F17210 Nicotine dependence, cigarettes, uncomplicated: Secondary | ICD-10-CM | POA: Diagnosis not present

## 2017-12-22 DIAGNOSIS — I252 Old myocardial infarction: Secondary | ICD-10-CM | POA: Diagnosis not present

## 2017-12-22 DIAGNOSIS — Z7982 Long term (current) use of aspirin: Secondary | ICD-10-CM | POA: Diagnosis not present

## 2017-12-22 LAB — BASIC METABOLIC PANEL
ANION GAP: 8 (ref 5–15)
BUN: 16 mg/dL (ref 6–20)
CALCIUM: 9.7 mg/dL (ref 8.9–10.3)
CO2: 23 mmol/L (ref 22–32)
Chloride: 108 mmol/L (ref 98–111)
Creatinine, Ser: 0.8 mg/dL (ref 0.44–1.00)
GLUCOSE: 100 mg/dL — AB (ref 70–99)
Potassium: 4.1 mmol/L (ref 3.5–5.1)
SODIUM: 139 mmol/L (ref 135–145)

## 2017-12-22 LAB — CBC
HCT: 38.1 % (ref 36.0–46.0)
Hemoglobin: 12.7 g/dL (ref 12.0–15.0)
MCH: 29.5 pg (ref 26.0–34.0)
MCHC: 33.3 g/dL (ref 30.0–36.0)
MCV: 88.4 fL (ref 78.0–100.0)
PLATELETS: 298 10*3/uL (ref 150–400)
RBC: 4.31 MIL/uL (ref 3.87–5.11)
RDW: 12.9 % (ref 11.5–15.5)
WBC: 5.8 10*3/uL (ref 4.0–10.5)

## 2017-12-22 MED ORDER — ASPIRIN 81 MG PO CHEW
81.0000 mg | CHEWABLE_TABLET | ORAL | Status: AC
  Administered 2017-12-23: 81 mg via ORAL
  Filled 2017-12-22: qty 1

## 2017-12-22 MED ORDER — SODIUM CHLORIDE 0.9% FLUSH: 3.0000 mL | INTRAVENOUS | Status: DC | PRN

## 2017-12-22 MED ORDER — HYDROCORTISONE 2.5 % RE CREA
TOPICAL_CREAM | Freq: Two times a day (BID) | RECTAL | Status: DC
  Administered 2017-12-22 – 2017-12-23 (×4): via RECTAL
  Filled 2017-12-22: qty 28.35

## 2017-12-22 MED ORDER — SODIUM CHLORIDE 0.9 % WEIGHT BASED INFUSION
3.0000 mL/kg/h | INTRAVENOUS | Status: DC
  Administered 2017-12-23: 3 mL/kg/h via INTRAVENOUS

## 2017-12-22 MED ORDER — SODIUM CHLORIDE 0.9 % IV SOLN: 250.0000 mL | INTRAVENOUS | Status: DC | PRN

## 2017-12-22 MED ORDER — TRAZODONE HCL 50 MG PO TABS
50.0000 mg | ORAL_TABLET | Freq: Once | ORAL | Status: AC
  Administered 2017-12-22: 50 mg via ORAL
  Filled 2017-12-22: qty 1

## 2017-12-22 MED ORDER — SODIUM CHLORIDE 0.9% FLUSH
3.0000 mL | Freq: Two times a day (BID) | INTRAVENOUS | Status: DC
  Administered 2017-12-22 – 2017-12-23 (×2): 3 mL via INTRAVENOUS

## 2017-12-22 MED ORDER — SODIUM CHLORIDE 0.9 % WEIGHT BASED INFUSION: 1.0000 mL/kg/h | INTRAVENOUS | Status: DC

## 2017-12-22 NOTE — Progress Notes (Signed)
Progress Note  Patient Name: [name] Date of Encounter: 12/22/2017  Primary Cardiologist: Dr. Quay Burow  Subjective   Reports intermittent chest tightness without specific provocation.  No palpitations or breathlessness at rest.  Inpatient Medications    Scheduled Meds: . aspirin EC  81 mg Oral Daily  . atorvastatin  80 mg Oral Daily  . enoxaparin (LOVENOX) injection  40 mg Subcutaneous Q24H  . hydrocortisone   Rectal BID  . metoprolol tartrate  12.5 mg Oral BID  . ticagrelor  90 mg Oral BID    PRN Meds: acetaminophen, morphine injection, nitroGLYCERIN, ondansetron (ZOFRAN) IV   Vital Signs    Vitals:   12/21/17 1959 12/21/17 2115 12/22/17 0049 12/22/17 0439  BP: 95/64 95/77 122/78 113/64  Pulse: 78 74 64 67  Resp: 17  13 20   Temp: 97.6 F (36.4 C)  98.8 F (37.1 C) 98 F (36.7 C)  TempSrc: Oral  Oral Oral  SpO2: 99%  100% 100%  Weight:    175 lb 4.8 oz (79.5 kg)  Height:        Intake/Output Summary (Last 24 hours) at 12/22/2017 0811 Last data filed at 12/21/2017 2120 Gross per 24 hour  Intake 780 ml  Output -  Net 780 ml   Filed Weights   12/20/17 1756 12/20/17 2245 12/22/17 0439  Weight: 171 lb (77.6 kg) 173 lb 1.6 oz (78.5 kg) 175 lb 4.8 oz (79.5 kg)    Telemetry    Sinus rhythm.  Personally reviewed.  ECG    Tracing from 12/22/2017 shows a sinus bradycardia with low voltage, decreased R wave progression, inferolateral T wave inversions.  Personally reviewed.  Physical Exam   GEN: No acute distress.   Neck: No JVD. Cardiac: RRR, no murmur, rub, or gallop.  Respiratory: Nonlabored. Clear to auscultation bilaterally. GI: Soft, nontender, bowel sounds present. MS: No edema; No deformity. Neuro:  Nonfocal. Psych: Alert and oriented x 3. Normal affect.  Labs    Chemistry Recent Labs  Lab 12/20/17 1920 12/22/17 0655  NA 139 139  K 4.0 4.1  CL 110 108  CO2 22 23  GLUCOSE 92 100*  BUN 9 16  CREATININE 0.87 0.80  CALCIUM  9.8 9.7  GFRNONAA >60 >60  GFRAA >60 >60  ANIONGAP 7 8     Hematology Recent Labs  Lab 12/20/17 1920 12/22/17 0655  WBC 7.1 5.8  RBC 4.27 4.31  HGB 12.8 12.7  HCT 38.6 38.1  MCV 90.4 88.4  MCH 30.0 29.5  MCHC 33.2 33.3  RDW 13.2 12.9  PLT 322 298    Cardiac Enzymes Recent Labs  Lab 12/20/17 2115 12/20/17 2321 12/21/17 0028 12/21/17 0428  TROPONINI <0.03 <0.03 <0.03 <0.03    Recent Labs  Lab 12/20/17 1932  TROPIPOC 0.00     Radiology    Dg Chest 2 View  Result Date: 12/20/2017 CLINICAL DATA:  52 y/o  F; chest pain and shortness of breath today. EXAM: CHEST - 2 VIEW COMPARISON:  06/30/2017 CT chest and chest radiograph FINDINGS: Stable heart size and mediastinal contours are within normal limits. Small pulmonary nodules better characterized on prior CT of chest. No consolidation, effusion, or pneumothorax. The visualized skeletal structures are unremarkable. IMPRESSION: No acute pulmonary process identified. Electronically Signed   By: Kristine Garbe M.D.   On: 12/20/2017 19:50    Cardiac Studies   Cardiac Catheterization: 06/30/2017  Prox LAD lesion is 70% stenosed.  3rd Mrg lesion is 100% stenosed.  Prox Cx to Mid Cx lesion is 90% stenosed.  A stent was successfully placed.  Post intervention, there is a 0% residual stenosis.  There is mild to moderate left ventricular systolic dysfunction.  LV end diastolic pressure is normal.  The left ventricular ejection fraction is 45-50% by visual estimate.  IMPRESSION:Ms. Sandall had an out-of-hospital lateral STEMI probably on Friday with post-MI angina and positive enzymes. Her culprit lesion was a proximal to mid hazy appearing high grade AV groove circumflex. The distal branch was occluded near its termination probably from thromboembolism. She had a excellent endoscopic result from PCI and drug-eluting stenting using a synergy 3 mm x 16 mm long drug-eluting stent. She had preserved LV function  with inferoapical wall hypokinesia and an ejection fraction of approximately 45%. She will require standard post MI pharmacology including high-dose statin drug, dual antibiotic therapy including aspirin and Brilenta uninterrupted for 12 months, beta blocker and ACE inhibitor. Cardiac risk factor modification will be reinforced. Intractable continued every 4 hours full dose and then will be discontinued. Femoral sheath will be removed after that and pressure held. She did have a small hematoma at the end of the case.  NST: 10/2017  Nuclear stress EF: 60%. No wall motion abnormalities.  The left ventricular ejection fraction is normal (55-65%).  There was no ST segment deviation noted during stress.  Defect 1: There is a medium defect of severe severity present in the mid inferior, mid inferolateral, apical inferior and apical lateral location.  Findings consistent with prior myocardial infarction.  This is an intermediate risk study. No ischemia identified  Patient Profile     52 y.o. female with a history of CAD status post NSTEMI and DES intervention to the circumflex in January with residual 70% LAD stenosis, hyperlipidemia, stable pulmonary nodules, and tobacco use.  She presents with recurring chest pain.  Assessment & Plan    1.  Recurrent chest pain with typical and atypical features for unstable angina.  Troponin I levels are negative.  ECG abnormal but stable as outlined above.  Plan is to pursue diagnostic cardiac catheterization tomorrow, risks and benefits discussed and she is in agreement to proceed.  2.  CAD status post NSTEMI with DES to the circumflex in January 2019.  Residual 70% LAD stenosis was managed medically at that time.  3.  Mixed hyperlipidemia, on statin therapy.  4.  Tobacco abuse, smoking cessation discussed.  Continue medical therapy including aspirin, Brilinta, Lipitor, Lopressor, and Lovenox.  Cardiac catheterization is scheduled for tomorrow as an  add-on case.  Signed, Rozann Lesches, MD  12/22/2017, 8:11 AM

## 2017-12-22 NOTE — Progress Notes (Signed)
PROGRESS NOTE    [name]  DUK:[id number] DOB: 02/13/1966 DOA: 12/20/2017 PCP: Shawnee Knapp, MD  Brief [place] is a 52 y.o. female with medical history significant of CAD s/p DES to LCX in Jan this year.  Stress test in May showed no reversible ischemia, showed fixed inferior wall defect, preserved EF. Patient presented to the ED with c/o CP.   Assessment & Plan:     Unstable angina/known CAD -History of drug-eluting stent to circumflex in 06/2017 -Had a 70% LAD lesion at the time -troponins negative -Continue aspirin, brilinta, beta blocker, statin -Cardiology consulting, plan for left heart catheterization tomorrow   remote history of sarcoidosis -Briefly treated with steroids in [place] 15-20 years ago -Refer to pulmonary as outpatient  Tobacco abuse -Counseled   DVT prophylaxis:Lovenox Code Status: Full code Family Communication:sister at bedside Disposition Plan: home pending cardiac catheterization  Consultants:   cardiology   Procedures:   Antimicrobials:    Subjective: -had some mild chest pain yesterday afternoon and some dyspnea with exertion  Objective: Vitals:   12/22/17 0049 12/22/17 0439 12/22/17 0813 12/22/17 0831  BP: 122/78 113/64  115/63  Pulse: 64 67 64 66  Resp: 13 20 17    Temp: 98.8 F (37.1 C) 98 F (36.7 C) (!) 97.5 F (36.4 C)   TempSrc: Oral Oral Axillary   SpO2: 100% 100% 100%   Weight:  79.5 kg (175 lb 4.8 oz)    Height:        Intake/Output Summary (Last 24 hours) at 12/22/2017 1137 Last data filed at 12/21/2017 2120 Gross per 24 hour  Intake 780 ml  Output -  Net 780 ml   Filed Weights   12/20/17 1756 12/20/17 2245 12/22/17 0439  Weight: 77.6 kg (171 lb) 78.5 kg (173 lb 1.6 oz) 79.5 kg (175 lb 4.8 oz)    Examination:  Gen: Awake, Alert, Oriented X 3,  HEENT: PERRLA, Neck supple, no JVD Lungs: Good air movement bilaterally, CTAB CVS: RRR,No Gallops,Rubs or new Murmurs Abd: soft, Non  tender, non distended, BS present Extremities: No Cyanosis, Clubbing or edema Skin: no new rashes Psychiatry: Judgement and insight appear normal. Mood & affect appropriate.     Data Reviewed:   CBC: Recent Labs  Lab 12/20/17 1920 12/22/17 0655  WBC 7.1 5.8  HGB 12.8 12.7  HCT 38.6 38.1  MCV 90.4 88.4  PLT 322 376   Basic Metabolic Panel: Recent Labs  Lab 12/20/17 1920 12/22/17 0655  NA 139 139  K 4.0 4.1  CL 110 108  CO2 22 23  GLUCOSE 92 100*  BUN 9 16  CREATININE 0.87 0.80  CALCIUM 9.8 9.7   GFR: Estimated Creatinine Clearance: 85.8 mL/min (by C-G formula based on SCr of 0.8 mg/dL). Liver Function Tests: No results for input(s): AST, ALT, ALKPHOS, BILITOT, PROT, ALBUMIN in the last 168 hours. No results for input(s): LIPASE, AMYLASE in the last 168 hours. No results for input(s): AMMONIA in the last 168 hours. Coagulation Profile: No results for input(s): INR, PROTIME in the last 168 hours. Cardiac Enzymes: Recent Labs  Lab 12/20/17 2115 12/20/17 2321 12/21/17 0028 12/21/17 0428  TROPONINI <0.03 <0.03 <0.03 <0.03   BNP (last 3 results) No results for input(s): PROBNP in the last 8760 hours. HbA1C: No results for input(s): HGBA1C in the last 72 hours. CBG: No results for input(s): GLUCAP in the last 168 hours. Lipid Profile: No results for input(s): CHOL, HDL, LDLCALC, TRIG, CHOLHDL, LDLDIRECT in the  last 72 hours. Thyroid Function Tests: No results for input(s): TSH, T4TOTAL, FREET4, T3FREE, THYROIDAB in the last 72 hours. Anemia Panel: No results for input(s): VITAMINB12, FOLATE, FERRITIN, TIBC, IRON, RETICCTPCT in the last 72 hours. Urine analysis:    Component Value Date/Time   COLORURINE YELLOW 07/09/2017 1618   APPEARANCEUR HAZY (A) 07/09/2017 1618   LABSPEC 1.010 07/09/2017 1618   PHURINE 7.0 07/09/2017 1618   GLUCOSEU NEGATIVE 07/09/2017 1618   HGBUR SMALL (A) 07/09/2017 1618   BILIRUBINUR NEGATIVE 07/09/2017 1618   BILIRUBINUR  negative 10/31/2016 1005   KETONESUR NEGATIVE 07/09/2017 1618   PROTEINUR NEGATIVE 07/09/2017 1618   UROBILINOGEN 0.2 10/31/2016 1005   UROBILINOGEN 1.0 01/28/2015 1329   NITRITE POSITIVE (A) 07/09/2017 1618   LEUKOCYTESUR MODERATE (A) 07/09/2017 1618   Sepsis Labs: @LABRCNTIP (procalcitonin:4,lacticidven:4)  ) Recent Results (from the past 240 hour(s))  MRSA PCR Screening     Status: None   Collection Time: 12/20/17 11:17 PM  Result Value Ref Range Status   MRSA by PCR NEGATIVE NEGATIVE Final    Comment:        The GeneXpert MRSA Assay (FDA approved for NASAL specimens only), is one component of a comprehensive MRSA colonization surveillance program. It is not intended to diagnose MRSA infection nor to guide or monitor treatment for MRSA infections. Performed at [facility], [place] [address], [place], [place] [address]          Radiology Studies: Dg Chest 2 View  Result Date: 12/20/2017 CLINICAL DATA:  52 y/o  F; chest pain and shortness of breath today. EXAM: CHEST - 2 VIEW COMPARISON:  06/30/2017 CT chest and chest radiograph FINDINGS: Stable heart size and mediastinal contours are within normal limits. Small pulmonary nodules better characterized on prior CT of chest. No consolidation, effusion, or pneumothorax. The visualized skeletal structures are unremarkable. IMPRESSION: No acute pulmonary process identified. Electronically Signed   By: Kristine Garbe M.D.   On: 12/20/2017 19:50        Scheduled Meds: . aspirin EC  81 mg Oral Daily  . atorvastatin  80 mg Oral Daily  . enoxaparin (LOVENOX) injection  40 mg Subcutaneous Q24H  . hydrocortisone   Rectal BID  . metoprolol tartrate  12.5 mg Oral BID  . ticagrelor  90 mg Oral BID   Continuous Infusions:   LOS: 0 days    Time spent: 66min    Domenic Polite, MD [facility] Page via [email or web address], password [email or web address] After 7PM please contact night-coverage  12/22/2017, 11:37 AM

## 2017-12-23 ENCOUNTER — Encounter (HOSPITAL_COMMUNITY)
Admission: EM | Disposition: A | Discharge status: Home / Self Care | Payer: Self-pay | Source: Home / Self Care | Attending: Emergency Medicine

## 2017-12-23 DIAGNOSIS — I10 Essential (primary) hypertension: Secondary | ICD-10-CM

## 2017-12-23 DIAGNOSIS — R079 Chest pain, unspecified: Secondary | ICD-10-CM

## 2017-12-23 DIAGNOSIS — E785 Hyperlipidemia, unspecified: Secondary | ICD-10-CM | POA: Diagnosis not present

## 2017-12-23 HISTORY — PX: LEFT HEART CATH AND CORONARY ANGIOGRAPHY: CATH118249

## 2017-12-23 LAB — BASIC METABOLIC PANEL
ANION GAP: 5 (ref 5–15)
BUN: 12 mg/dL (ref 6–20)
CALCIUM: 9.4 mg/dL (ref 8.9–10.3)
CO2: 25 mmol/L (ref 22–32)
CREATININE: 0.83 mg/dL (ref 0.44–1.00)
Chloride: 112 mmol/L — ABNORMAL HIGH (ref 98–111)
GFR calc Af Amer: >60 mL/min (ref >60)
Glucose, Bld: 115 mg/dL — ABNORMAL HIGH (ref 70–99)
Potassium: 3.9 mmol/L (ref 3.5–5.1)
SODIUM: 142 mmol/L (ref 135–145)

## 2017-12-23 LAB — CBC
HEMATOCRIT: 37 % (ref 36.0–46.0)
Hemoglobin: 12 g/dL (ref 12.0–15.0)
MCH: 29.3 pg (ref 26.0–34.0)
MCHC: 32.4 g/dL (ref 30.0–36.0)
MCV: 90.5 fL (ref 78.0–100.0)
Platelets: 308 10*3/uL (ref 150–400)
RBC: 4.09 MIL/uL (ref 3.87–5.11)
RDW: 13.1 % (ref 11.5–15.5)
WBC: 5.3 10*3/uL (ref 4.0–10.5)

## 2017-12-23 SURGERY — LEFT HEART CATH AND CORONARY ANGIOGRAPHY
Anesthesia: LOCAL

## 2017-12-23 MED ORDER — SODIUM CHLORIDE 0.9 % IV SOLN: 250.0000 mL | INTRAVENOUS | Status: DC | PRN

## 2017-12-23 MED ORDER — ATORVASTATIN CALCIUM 80 MG PO TABS: 80.0000 mg | ORAL_TABLET | Freq: Every day | ORAL | Status: DC

## 2017-12-23 MED ORDER — FENTANYL CITRATE (PF) 100 MCG/2ML IJ SOLN
INTRAMUSCULAR | Status: AC
  Filled 2017-12-23: qty 2

## 2017-12-23 MED ORDER — HEPARIN (PORCINE) IN NACL 1000-0.9 UT/500ML-% IV SOLN
INTRAVENOUS | Status: DC | PRN
  Administered 2017-12-23 (×2): 500 mL

## 2017-12-23 MED ORDER — SODIUM CHLORIDE 0.9 % IV SOLN
INTRAVENOUS | Status: AC
  Administered 2017-12-23: 17:00:00 via INTRAVENOUS

## 2017-12-23 MED ORDER — MIDAZOLAM HCL 2 MG/2ML IJ SOLN
INTRAMUSCULAR | Status: AC
  Filled 2017-12-23: qty 2

## 2017-12-23 MED ORDER — SODIUM CHLORIDE 0.9% FLUSH: 3.0000 mL | Freq: Two times a day (BID) | INTRAVENOUS | Status: DC

## 2017-12-23 MED ORDER — LIDOCAINE HCL (PF) 1 % IJ SOLN
INTRAMUSCULAR | Status: DC | PRN
  Administered 2017-12-23: 15 mL

## 2017-12-23 MED ORDER — SODIUM CHLORIDE 0.9% FLUSH: 3.0000 mL | INTRAVENOUS | Status: DC | PRN

## 2017-12-23 MED ORDER — TICAGRELOR 90 MG PO TABS
90.0000 mg | ORAL_TABLET | Freq: Two times a day (BID) | ORAL | Status: DC
  Administered 2017-12-23 – 2017-12-24 (×2): 90 mg via ORAL
  Filled 2017-12-23 (×2): qty 1

## 2017-12-23 MED ORDER — LIDOCAINE HCL (PF) 1 % IJ SOLN
INTRAMUSCULAR | Status: AC
  Filled 2017-12-23: qty 30

## 2017-12-23 MED ORDER — ONDANSETRON HCL 4 MG/2ML IJ SOLN: 4.0000 mg | Freq: Four times a day (QID) | INTRAMUSCULAR | Status: DC | PRN

## 2017-12-23 MED ORDER — MIDAZOLAM HCL 2 MG/2ML IJ SOLN
INTRAMUSCULAR | Status: DC | PRN
  Administered 2017-12-23: 1 mg via INTRAVENOUS

## 2017-12-23 MED ORDER — IOHEXOL 350 MG/ML SOLN
INTRAVENOUS | Status: DC | PRN
  Administered 2017-12-23: 50 mL via INTRA_ARTERIAL

## 2017-12-23 MED ORDER — HEPARIN (PORCINE) IN NACL 1000-0.9 UT/500ML-% IV SOLN
INTRAVENOUS | Status: AC
  Filled 2017-12-23: qty 1000

## 2017-12-23 MED ORDER — FENTANYL CITRATE (PF) 100 MCG/2ML IJ SOLN
INTRAMUSCULAR | Status: DC | PRN
  Administered 2017-12-23: 25 ug via INTRAVENOUS

## 2017-12-23 MED ORDER — ACETAMINOPHEN 325 MG PO TABS: 650.0000 mg | ORAL_TABLET | ORAL | Status: DC | PRN

## 2017-12-23 MED ORDER — ASPIRIN 81 MG PO CHEW
81.0000 mg | CHEWABLE_TABLET | Freq: Every day | ORAL | Status: DC
  Administered 2017-12-24: 81 mg via ORAL
  Filled 2017-12-23: qty 1

## 2017-12-23 SURGICAL SUPPLY — 9 items
CATH INFINITI 5FR ANG PIGTAIL (CATHETERS) IMPLANT
CATH INFINITI 5FR MULTPACK ANG (CATHETERS) ×1 IMPLANT
DEVICE CLOSURE MYNXGRIP 5F (Vascular Products) ×1 IMPLANT
KIT HEART LEFT (KITS) ×2 IMPLANT
PACK CARDIAC CATHETERIZATION (CUSTOM PROCEDURE TRAY) ×2 IMPLANT
SHEATH PINNACLE 5F 10CM (SHEATH) ×1 IMPLANT
SYR MEDRAD MARK V 150ML (SYRINGE) ×2 IMPLANT
TRANSDUCER W/STOPCOCK (MISCELLANEOUS) ×2 IMPLANT
WIRE EMERALD 3MM-J .035X150CM (WIRE) ×4 IMPLANT

## 2017-12-23 NOTE — Progress Notes (Signed)
PROGRESS NOTE    [name]  JME:[id number] DOB: April 19, 1966 DOA: 12/20/2017 PCP: Shawnee Knapp, MD  Brief [place] is a 52 y.o. female with medical history significant of CAD s/p DES to LCX in Jan this year.  Stress test in May showed no reversible ischemia, showed fixed inferior wall defect, preserved EF. Patient presented to the ED with c/o CP.   Assessment & Plan:     Unstable angina/known CAD -History of drug-eluting stent to circumflex in 06/2017 -Had a 70% LAD lesion at the time -troponins negative, continues to have intermittent chest discomfort/atypical -Continue aspirin, brilinta, beta blocker, statin, on heparin gtt -Cardiology consulting,  -for left heart catheterization today   remote history of sarcoidosis -Briefly treated with steroids in [place] 15-20 years ago -Refer to pulmonary as outpatient at discharge  Tobacco abuse -Counseled   DVT prophylaxis:Lovenox Code Status: Full code Family Communication:sister at bedside Disposition Plan: home pending cardiac catheterization  Consultants:   cardiology   Procedures:   Antimicrobials:    Subjective: -mild intermittent chest discomfort  Objective: Vitals:   12/22/17 2103 12/22/17 2235 12/22/17 2300 12/23/17 0534  BP: 116/74  117/61 106/71  Pulse: 92 76  79  Resp:  18 (!) 21 16  Temp:  97.7 F (36.5 C)  98.2 F (36.8 C)  TempSrc:  Oral  Oral  SpO2:  99%  100%  Weight:    79.3 kg (174 lb 14.4 oz)  Height:        Intake/Output Summary (Last 24 hours) at 12/23/2017 1054 Last data filed at 12/23/2017 2297 Gross per 24 hour  Intake 911.03 ml  Output -  Net 911.03 ml   Filed Weights   12/20/17 2245 12/22/17 0439 12/23/17 0534  Weight: 78.5 kg (173 lb 1.6 oz) 79.5 kg (175 lb 4.8 oz) 79.3 kg (174 lb 14.4 oz)    Examination:  Gen: Awake, Alert, Oriented X 3,  HEENT: PERRLA, Neck supple, no JVD Lungs: Good air movement bilaterally, CTAB CVS: RRR,No Gallops,Rubs or new  Murmurs Abd: soft, Non tender, non distended, BS present Extremities: No Cyanosis, Clubbing or edema Skin: no new rashes Psychiatry: Judgement and insight appear normal. Mood & affect appropriate.     Data Reviewed:   CBC: Recent Labs  Lab 12/20/17 1920 12/22/17 0655 12/23/17 0603  WBC 7.1 5.8 5.3  HGB 12.8 12.7 12.0  HCT 38.6 38.1 37.0  MCV 90.4 88.4 90.5  PLT 322 298 989   Basic Metabolic Panel: Recent Labs  Lab 12/20/17 1920 12/22/17 0655 12/23/17 0603  NA 139 139 142  K 4.0 4.1 3.9  CL 110 108 112*  CO2 22 23 25   GLUCOSE 92 100* 115*  BUN 9 16 12   CREATININE 0.87 0.80 0.83  CALCIUM 9.8 9.7 9.4   GFR: Estimated Creatinine Clearance: 82.7 mL/min (by C-G formula based on SCr of 0.83 mg/dL). Liver Function Tests: No results for input(s): AST, ALT, ALKPHOS, BILITOT, PROT, ALBUMIN in the last 168 hours. No results for input(s): LIPASE, AMYLASE in the last 168 hours. No results for input(s): AMMONIA in the last 168 hours. Coagulation Profile: No results for input(s): INR, PROTIME in the last 168 hours. Cardiac Enzymes: Recent Labs  Lab 12/20/17 2115 12/20/17 2321 12/21/17 0028 12/21/17 0428  TROPONINI <0.03 <0.03 <0.03 <0.03   BNP (last 3 results) No results for input(s): PROBNP in the last 8760 hours. HbA1C: No results for input(s): HGBA1C in the last 72 hours. CBG: No results for input(s): GLUCAP  in the last 168 hours. Lipid Profile: No results for input(s): CHOL, HDL, LDLCALC, TRIG, CHOLHDL, LDLDIRECT in the last 72 hours. Thyroid Function Tests: No results for input(s): TSH, T4TOTAL, FREET4, T3FREE, THYROIDAB in the last 72 hours. Anemia Panel: No results for input(s): VITAMINB12, FOLATE, FERRITIN, TIBC, IRON, RETICCTPCT in the last 72 hours. Urine analysis:    Component Value Date/Time   COLORURINE YELLOW 07/09/2017 1618   APPEARANCEUR HAZY (A) 07/09/2017 1618   LABSPEC 1.010 07/09/2017 1618   PHURINE 7.0 07/09/2017 1618   GLUCOSEU NEGATIVE  07/09/2017 1618   HGBUR SMALL (A) 07/09/2017 1618   BILIRUBINUR NEGATIVE 07/09/2017 1618   BILIRUBINUR negative 10/31/2016 1005   KETONESUR NEGATIVE 07/09/2017 1618   PROTEINUR NEGATIVE 07/09/2017 1618   UROBILINOGEN 0.2 10/31/2016 1005   UROBILINOGEN 1.0 01/28/2015 1329   NITRITE POSITIVE (A) 07/09/2017 1618   LEUKOCYTESUR MODERATE (A) 07/09/2017 1618   Sepsis Labs: @LABRCNTIP (procalcitonin:4,lacticidven:4)  ) Recent Results (from the past 240 hour(s))  MRSA PCR Screening     Status: None   Collection Time: 12/20/17 11:17 PM  Result Value Ref Range Status   MRSA by PCR NEGATIVE NEGATIVE Final    Comment:        The GeneXpert MRSA Assay (FDA approved for NASAL specimens only), is one component of a comprehensive MRSA colonization surveillance program. It is not intended to diagnose MRSA infection nor to guide or monitor treatment for MRSA infections. Performed at [facility], [place] [address] [place], [place] [address]          Radiology Studies: No results found.      Scheduled Meds: . aspirin EC  81 mg Oral Daily  . atorvastatin  80 mg Oral Daily  . enoxaparin (LOVENOX) injection  40 mg Subcutaneous Q24H  . hydrocortisone   Rectal BID  . metoprolol tartrate  12.5 mg Oral BID  . sodium chloride flush  3 mL Intravenous Q12H  . ticagrelor  90 mg Oral BID   Continuous Infusions: . sodium chloride    . sodium chloride 1 mL/kg/hr (12/23/17 3818)     LOS: 0 days    Time spent: 41min    Domenic Polite, MD [facility] Page via [email or web address], password [email or web address] After 7PM please contact night-coverage  12/23/2017, 10:54 AM

## 2017-12-23 NOTE — H&P (View-Only) (Signed)
Progress Note  Patient Name: [name] Date of Encounter: 12/23/2017  Primary Cardiologist: Dr. Quay Burow, MD  Subjective   Continues to have mild, intermittent chest pressure. Plan for cardiac cath today. Continue Hep gtt  Inpatient Medications    Scheduled Meds: . aspirin EC  81 mg Oral Daily  . atorvastatin  80 mg Oral Daily  . enoxaparin (LOVENOX) injection  40 mg Subcutaneous Q24H  . hydrocortisone   Rectal BID  . metoprolol tartrate  12.5 mg Oral BID  . sodium chloride flush  3 mL Intravenous Q12H  . ticagrelor  90 mg Oral BID   Continuous Infusions: . sodium chloride    . sodium chloride 1 mL/kg/hr (12/23/17 0643)   PRN Meds: sodium chloride, acetaminophen, morphine injection, nitroGLYCERIN, ondansetron (ZOFRAN) IV, sodium chloride flush   Vital Signs    Vitals:   12/22/17 2103 12/22/17 2235 12/22/17 2300 12/23/17 0534  BP: 116/74  117/61 106/71  Pulse: 92 76  79  Resp:  18 (!) 21 16  Temp:  97.7 F (36.5 C)  98.2 F (36.8 C)  TempSrc:  Oral  Oral  SpO2:  99%  100%  Weight:    174 lb 14.4 oz (79.3 kg)  Height:        Intake/Output Summary (Last 24 hours) at 12/23/2017 0731 Last data filed at 12/23/2017 9937 Gross per 24 hour  Intake 911.03 ml  Output -  Net 911.03 ml   Filed Weights   12/20/17 2245 12/22/17 0439 12/23/17 0534  Weight: 173 lb 1.6 oz (78.5 kg) 175 lb 4.8 oz (79.5 kg) 174 lb 14.4 oz (79.3 kg)    Physical Exam   General: Well developed, well nourished, NAD Skin: Warm, dry, intact  Head: Normocephalic, atraumatic, clear, moist mucus membranes. Neck: Negative for carotid bruits. No JVD Lungs:Clear to ausculation bilaterally. No wheezes, rales, or rhonchi. Breathing is unlabored. Cardiovascular: RRR with S1 S2. No murmurs, rubs or gallops Abdomen: Soft, non-tender, non-distended with normoactive bowel sounds. No obvious abdominal masses. MSK: Strength and tone appear normal for age. 5/5 in all  extremities Extremities: No edema. No clubbing or cyanosis. DP/PT pulses 2+ bilaterally Neuro: Alert and oriented. No focal deficits. No facial asymmetry. MAE spontaneously. Psych: Responds to questions appropriately with normal affect.    Labs    Chemistry Recent Labs  Lab 12/20/17 1920 12/22/17 0655  NA 139 139  K 4.0 4.1  CL 110 108  CO2 22 23  GLUCOSE 92 100*  BUN 9 16  CREATININE 0.87 0.80  CALCIUM 9.8 9.7  GFRNONAA >60 >60  GFRAA >60 >60  ANIONGAP 7 8    Hematology Recent Labs  Lab 12/20/17 1920 12/22/17 0655  WBC 7.1 5.8  RBC 4.27 4.31  HGB 12.8 12.7  HCT 38.6 38.1  MCV 90.4 88.4  MCH 30.0 29.5  MCHC 33.2 33.3  RDW 13.2 12.9  PLT 322 298   Cardiac Enzymes Recent Labs  Lab 12/20/17 2115 12/20/17 2321 12/21/17 0028 12/21/17 0428  TROPONINI <0.03 <0.03 <0.03 <0.03    Recent Labs  Lab 12/20/17 1932  TROPIPOC 0.00     BNPNo results for input(s): BNP, PROBNP in the last 168 hours.   DDimer No results for input(s): DDIMER in the last 168 hours.   Radiology    No results found.  Telemetry    12/23/17 NSR HR 67, T wave inversions - Personally Reviewed  ECG    12/22/17 NSR with low voltage QRS, TWI  in II, III, aVF, V5, V6- Personally Reviewed  Cardiac Studies   Cardiac Catheterization: 06/30/2017  Prox LAD lesion is 70% stenosed.  3rd Mrg lesion is 100% stenosed.  Prox Cx to Mid Cx lesion is 90% stenosed.  A stent was successfully placed.  Post intervention, there is a 0% residual stenosis.  There is mild to moderate left ventricular systolic dysfunction.  LV end diastolic pressure is normal.  The left ventricular ejection fraction is 45-50% by visual estimate.  IMPRESSION:[name] had an out-of-hospital lateral STEMI probably on Friday with post-MI angina and positive enzymes. Her culprit lesion was a proximal to mid hazy appearing high grade AV groove circumflex. The distal branch was occluded near its termination probably  from thromboembolism. She had a excellent endoscopic result from PCI and drug-eluting stenting using a synergy 3 mm x 16 mm long drug-eluting stent. She had preserved LV function with inferoapical wall hypokinesia and an ejection fraction of approximately 45%. She will require standard post MI pharmacology including high-dose statin drug, dual antibiotic therapy including aspirin and Brilenta uninterrupted for 12 months, beta blocker and ACE inhibitor. Cardiac risk factor modification will be reinforced. Intractable continued every 4 hours full dose and then will be discontinued. Femoral sheath will be removed after that and pressure held. She did have a small hematoma at the end of the case.  NST: 10/2017  Nuclear stress EF: 60%. No wall motion abnormalities.  The left ventricular ejection fraction is normal (55-65%).  There was no ST segment deviation noted during stress.  Defect 1: There is a medium defect of severe severity present in the mid inferior, mid inferolateral, apical inferior and apical lateral location.  Findings consistent with prior myocardial infarction.  This is an intermediate risk study. No ischemia identified  Patient Profile     52 y.o. female with a history of CAD status post NSTEMI and DES intervention to the circumflex in January with residual 70% LAD stenosis, hyperlipidemia, stable pulmonary nodules, and tobacco use.  She presents with recurring chest pain.  Assessment & Plan    1. Recurrent chest pain with typical and atypical features for unstable angina: -Patient recently admitted for ACS 06/2017 underwent a cardiac cath which a DES to LCx was placed (with residual 70% proximal LAD stenosis noted per cath report) -Repeat NST 10/2017 for continual atypical chest pain features we will prior infarct but no significant ischemia -Patient [facility]2017 with worsening fatigue and new onset chest pressure -Troponin, <0.03, <0.03, <0.03, <0.03 -EKG,  NSR HR 67 with noted T wave abnormalities, no acute ischemic changes -Continue medical therapy with ASA, Brilinta, statin, beta-blocker -For cardiac catheterization for today, 12/23/2017  2.  CAD s/p non-STEMI with DES to LCx 06/2017: -With known residual 70% LAD stenosis>>> managed medically  3.  Hyperlipidemia: -Lipid panel 12/02/2017, CHO- 112, HDL-37, LDL-54,Trig- 104 -Continue statin  3.  Tobacco abuse: -Smoking cessation strongly encouraged   Signed, Kathyrn Drown NP-C [facility] Pager: [phone] 12/23/2017, 7:31 AM    Patient examined chart reviewed No chest pain last cath done from RFA has good right radial Pulse for repeat cath today ? With Dr Gwenlyn Found who did last may need FFR of LAD if borderline lesion Continue ASA, beta blocker and statin   Jenkins Rouge   For questions or updates, please contact   Please consult [email or web address] for contact info under Cardiology/STEMI.

## 2017-12-23 NOTE — Interval H&P Note (Signed)
Cath Lab Visit (complete for each Cath Lab visit)  Clinical Evaluation Leading to the Procedure:   ACS: Yes.    Non-ACS:    Anginal Classification: CCS III  Anti-ischemic medical therapy: Minimal Therapy (1 class of medications)  Non-Invasive Test Results: No non-invasive testing performed  Prior CABG: No previous CABG      History and Physical Interval Note:  12/23/2017 3:51 PM  [name]  has presented today for surgery, with the diagnosis of unstable angina  The various methods of treatment have been discussed with the patient and family. After consideration of risks, benefits and other options for treatment, the patient has consented to  Procedure(s): LEFT HEART CATH AND CORONARY ANGIOGRAPHY (N/A) as a surgical intervention .  The patient's history has been reviewed, patient examined, no change in status, stable for surgery.  I have reviewed the patient's chart and labs.  Questions were answered to the patient's satisfaction.     Quay Burow

## 2017-12-23 NOTE — Progress Notes (Addendum)
Progress Note  Patient Name: [name] Date of Encounter: 12/23/2017  Primary Cardiologist: Dr. Quay Burow, MD  Subjective   Continues to have mild, intermittent chest pressure. Plan for cardiac cath today. Continue Hep gtt  Inpatient Medications    Scheduled Meds: . aspirin EC  81 mg Oral Daily  . atorvastatin  80 mg Oral Daily  . enoxaparin (LOVENOX) injection  40 mg Subcutaneous Q24H  . hydrocortisone   Rectal BID  . metoprolol tartrate  12.5 mg Oral BID  . sodium chloride flush  3 mL Intravenous Q12H  . ticagrelor  90 mg Oral BID   Continuous Infusions: . sodium chloride    . sodium chloride 1 mL/kg/hr (12/23/17 0643)   PRN Meds: sodium chloride, acetaminophen, morphine injection, nitroGLYCERIN, ondansetron (ZOFRAN) IV, sodium chloride flush   Vital Signs    Vitals:   12/22/17 2103 12/22/17 2235 12/22/17 2300 12/23/17 0534  BP: 116/74  117/61 106/71  Pulse: 92 76  79  Resp:  18 (!) 21 16  Temp:  97.7 F (36.5 C)  98.2 F (36.8 C)  TempSrc:  Oral  Oral  SpO2:  99%  100%  Weight:    174 lb 14.4 oz (79.3 kg)  Height:        Intake/Output Summary (Last 24 hours) at 12/23/2017 0731 Last data filed at 12/23/2017 0355 Gross per 24 hour  Intake 911.03 ml  Output -  Net 911.03 ml   Filed Weights   12/20/17 2245 12/22/17 0439 12/23/17 0534  Weight: 173 lb 1.6 oz (78.5 kg) 175 lb 4.8 oz (79.5 kg) 174 lb 14.4 oz (79.3 kg)    Physical Exam   General: Well developed, well nourished, NAD Skin: Warm, dry, intact  Head: Normocephalic, atraumatic, clear, moist mucus membranes. Neck: Negative for carotid bruits. No JVD Lungs:Clear to ausculation bilaterally. No wheezes, rales, or rhonchi. Breathing is unlabored. Cardiovascular: RRR with S1 S2. No murmurs, rubs or gallops Abdomen: Soft, non-tender, non-distended with normoactive bowel sounds. No obvious abdominal masses. MSK: Strength and tone appear normal for age. 5/5 in all  extremities Extremities: No edema. No clubbing or cyanosis. DP/PT pulses 2+ bilaterally Neuro: Alert and oriented. No focal deficits. No facial asymmetry. MAE spontaneously. Psych: Responds to questions appropriately with normal affect.    Labs    Chemistry Recent Labs  Lab 12/20/17 1920 12/22/17 0655  NA 139 139  K 4.0 4.1  CL 110 108  CO2 22 23  GLUCOSE 92 100*  BUN 9 16  CREATININE 0.87 0.80  CALCIUM 9.8 9.7  GFRNONAA >60 >60  GFRAA >60 >60  ANIONGAP 7 8    Hematology Recent Labs  Lab 12/20/17 1920 12/22/17 0655  WBC 7.1 5.8  RBC 4.27 4.31  HGB 12.8 12.7  HCT 38.6 38.1  MCV 90.4 88.4  MCH 30.0 29.5  MCHC 33.2 33.3  RDW 13.2 12.9  PLT 322 298   Cardiac Enzymes Recent Labs  Lab 12/20/17 2115 12/20/17 2321 12/21/17 0028 12/21/17 0428  TROPONINI <0.03 <0.03 <0.03 <0.03    Recent Labs  Lab 12/20/17 1932  TROPIPOC 0.00     BNPNo results for input(s): BNP, PROBNP in the last 168 hours.   DDimer No results for input(s): DDIMER in the last 168 hours.   Radiology    No results found.  Telemetry    12/23/17 NSR HR 67, T wave inversions - Personally Reviewed  ECG    12/22/17 NSR with low voltage QRS, TWI  in II, III, aVF, V5, V6- Personally Reviewed  Cardiac Studies   Cardiac Catheterization: 06/30/2017  Prox LAD lesion is 70% stenosed.  3rd Mrg lesion is 100% stenosed.  Prox Cx to Mid Cx lesion is 90% stenosed.  A stent was successfully placed.  Post intervention, there is a 0% residual stenosis.  There is mild to moderate left ventricular systolic dysfunction.  LV end diastolic pressure is normal.  The left ventricular ejection fraction is 45-50% by visual estimate.  IMPRESSION:Ms. Kingsberry had an out-of-hospital lateral STEMI probably on Friday with post-MI angina and positive enzymes. Her culprit lesion was a proximal to mid hazy appearing high grade AV groove circumflex. The distal branch was occluded near its termination probably  from thromboembolism. She had a excellent endoscopic result from PCI and drug-eluting stenting using a synergy 3 mm x 16 mm long drug-eluting stent. She had preserved LV function with inferoapical wall hypokinesia and an ejection fraction of approximately 45%. She will require standard post MI pharmacology including high-dose statin drug, dual antibiotic therapy including aspirin and Brilenta uninterrupted for 12 months, beta blocker and ACE inhibitor. Cardiac risk factor modification will be reinforced. Intractable continued every 4 hours full dose and then will be discontinued. Femoral sheath will be removed after that and pressure held. She did have a small hematoma at the end of the case.  NST: 10/2017  Nuclear stress EF: 60%. No wall motion abnormalities.  The left ventricular ejection fraction is normal (55-65%).  There was no ST segment deviation noted during stress.  Defect 1: There is a medium defect of severe severity present in the mid inferior, mid inferolateral, apical inferior and apical lateral location.  Findings consistent with prior myocardial infarction.  This is an intermediate risk study. No ischemia identified  Patient Profile     52 y.o. female with a history of CAD status post NSTEMI and DES intervention to the circumflex in January with residual 70% LAD stenosis, hyperlipidemia, stable pulmonary nodules, and tobacco use.  She presents with recurring chest pain.  Assessment & Plan    1. Recurrent chest pain with typical and atypical features for unstable angina: -Patient recently admitted for ACS 06/2017 underwent a cardiac cath which a DES to LCx was placed (with residual 70% proximal LAD stenosis noted per cath report) -Repeat NST 10/2017 for continual atypical chest pain features we will prior infarct but no significant ischemia -Patient presented to [facility] on 12/20/2017 with worsening fatigue and new onset chest pressure -Troponin, <0.03, <0.03, <0.03, <0.03 -EKG,  NSR HR 67 with noted T wave abnormalities, no acute ischemic changes -Continue medical therapy with ASA, Brilinta, statin, beta-blocker -For cardiac catheterization for today, 12/23/2017  2.  CAD s/p non-STEMI with DES to LCx 06/2017: -With known residual 70% LAD stenosis>>> managed medically  3.  Hyperlipidemia: -Lipid panel 12/02/2017, CHO- 112, HDL-37, LDL-54,Trig- 104 -Continue statin  3.  Tobacco abuse: -Smoking cessation strongly encouraged   Signed, Kathyrn Drown NP-C [facility] Pager: [phone] 12/23/2017, 7:31 AM    Patient examined chart reviewed No chest pain last cath done from RFA has good right radial Pulse for repeat cath today ? With Dr Gwenlyn Found who did last may need FFR of LAD if borderline lesion Continue ASA, beta blocker and statin   Jenkins Rouge   For questions or updates, please contact   Please consult [email or web address] for contact info under Cardiology/STEMI.

## 2017-12-24 ENCOUNTER — Encounter (HOSPITAL_COMMUNITY): Payer: Self-pay | Admitting: Cardiovascular Disease

## 2017-12-24 DIAGNOSIS — R079 Chest pain, unspecified: Secondary | ICD-10-CM | POA: Diagnosis not present

## 2017-12-24 DIAGNOSIS — I249 Acute ischemic heart disease, unspecified: Secondary | ICD-10-CM | POA: Diagnosis not present

## 2017-12-24 DIAGNOSIS — I25119 Atherosclerotic heart disease of native coronary artery with unspecified angina pectoris: Secondary | ICD-10-CM | POA: Diagnosis not present

## 2017-12-24 LAB — CBC
HCT: 36 % (ref 36.0–46.0)
HEMOGLOBIN: 11.9 g/dL — AB (ref 12.0–15.0)
MCH: 29.4 pg (ref 26.0–34.0)
MCHC: 33.1 g/dL (ref 30.0–36.0)
MCV: 88.9 fL (ref 78.0–100.0)
Platelets: 291 10*3/uL (ref 150–400)
RBC: 4.05 MIL/uL (ref 3.87–5.11)
RDW: 13.1 % (ref 11.5–15.5)
WBC: 5.9 10*3/uL (ref 4.0–10.5)

## 2017-12-24 LAB — BASIC METABOLIC PANEL
Anion gap: 5 (ref 5–15)
BUN: 8 mg/dL (ref 6–20)
CHLORIDE: 112 mmol/L — AB (ref 98–111)
CO2: 26 mmol/L (ref 22–32)
CREATININE: 0.85 mg/dL (ref 0.44–1.00)
Calcium: 9.5 mg/dL (ref 8.9–10.3)
GFR calc non Af Amer: >60 mL/min (ref >60)
Glucose, Bld: 112 mg/dL — ABNORMAL HIGH (ref 70–99)
POTASSIUM: 4.3 mmol/L (ref 3.5–5.1)
SODIUM: 143 mmol/L (ref 135–145)

## 2017-12-24 NOTE — Progress Notes (Signed)
[facility]     Progress Note  Patient Name: [name] Date of Encounter: 12/24/2017  Primary Cardiologist: Dr. Quay Burow, MD  Subjective   No complaints ready to go home   Inpatient Medications    Scheduled Meds: . aspirin  81 mg Oral Daily  . atorvastatin  80 mg Oral q1800  . enoxaparin (LOVENOX) injection  40 mg Subcutaneous Q24H  . hydrocortisone   Rectal BID  . metoprolol tartrate  12.5 mg Oral BID  . sodium chloride flush  3 mL Intravenous Q12H  . ticagrelor  90 mg Oral BID   Continuous Infusions: . sodium chloride     PRN Meds: sodium chloride, acetaminophen, morphine injection, nitroGLYCERIN, ondansetron (ZOFRAN) IV, sodium chloride flush   Vital Signs    Vitals:   12/23/17 2031 12/23/17 2127 12/24/17 0346 12/24/17 0525  BP: 113/61  (!) 90/53 126/77  Pulse:  87 77   Resp: (!) 22  18 17   Temp:   98.1 F (36.7 C)   TempSrc:   Oral   SpO2:   98%   Weight:   177 lb 4.8 oz (80.4 kg)   Height:        Intake/Output Summary (Last 24 hours) at 12/24/2017 0836 Last data filed at 12/24/2017 0308 Gross per 24 hour  Intake 1741.38 ml  Output 2 ml  Net 1739.38 ml   Filed Weights   12/22/17 0439 12/23/17 0534 12/24/17 0346  Weight: 175 lb 4.8 oz (79.5 kg) 174 lb 14.4 oz (79.3 kg) 177 lb 4.8 oz (80.4 kg)    Physical Exam   Affect appropriate Overweight black female  HEENT: normal Neck supple with no adenopathy JVP normal no bruits no thyromegaly Lungs clear with no wheezing and good diaphragmatic motion Heart:  S1/S2 no murmur, no rub, gallop or click PMI normal Abdomen: benighn, BS positve, no tenderness, no AAA no bruit.  No HSM or HJR Distal pulses intact with no bruits No edema Neuro non-focal Skin warm and dry No muscular weakness RFA A no hematoma   Labs    Chemistry Recent Labs  Lab 12/22/17 0655 12/23/17 0603 12/24/17 0602  NA 139 142 143  K 4.1 3.9 4.3  CL 108 112* 112*  CO2 23 25 26   GLUCOSE 100* 115* 112*  BUN 16 12  8   CREATININE 0.80 0.83 0.85  CALCIUM 9.7 9.4 9.5  GFRNONAA >60 >60 >60  GFRAA >60 >60 >60  ANIONGAP 8 5 5     Hematology Recent Labs  Lab 12/22/17 0655 12/23/17 0603 12/24/17 0602  WBC 5.8 5.3 5.9  RBC 4.31 4.09 4.05  HGB 12.7 12.0 11.9*  HCT 38.1 37.0 36.0  MCV 88.4 90.5 88.9  MCH 29.5 29.3 29.4  MCHC 33.3 32.4 33.1  RDW 12.9 13.1 13.1  PLT 298 308 291   Cardiac Enzymes Recent Labs  Lab 12/20/17 2115 12/20/17 2321 12/21/17 0028 12/21/17 0428  TROPONINI <0.03 <0.03 <0.03 <0.03    Recent Labs  Lab 12/20/17 1932  TROPIPOC 0.00     BNPNo results for input(s): BNP, PROBNP in the last 168 hours.   DDimer No results for input(s): DDIMER in the last 168 hours.   Radiology    No results found.  Telemetry    12/23/17 NSR HR 67, T wave inversions - Personally Reviewed  ECG    12/22/17 NSR with low voltage QRS, TWI in II, III, aVF, V5, V6- Personally Reviewed  Cardiac Studies   Cardiac Catheterization: 06/30/2017  Prox LAD  lesion is 70% stenosed.  3rd Mrg lesion is 100% stenosed.  Prox Cx to Mid Cx lesion is 90% stenosed.  A stent was successfully placed.  Post intervention, there is a 0% residual stenosis.  There is mild to moderate left ventricular systolic dysfunction.  LV end diastolic pressure is normal.  The left ventricular ejection fraction is 45-50% by visual estimate.  IMPRESSION:Ms. Ghanem had an out-of-hospital lateral STEMI probably on Friday with post-MI angina and positive enzymes. Her culprit lesion was a proximal to mid hazy appearing high grade AV groove circumflex. The distal branch was occluded near its termination probably from thromboembolism. She had a excellent endoscopic result from PCI and drug-eluting stenting using a synergy 3 mm x 16 mm long drug-eluting stent. She had preserved LV function with inferoapical wall hypokinesia and an ejection fraction of approximately 45%. She will require standard post MI pharmacology  including high-dose statin drug, dual antibiotic therapy including aspirin and Brilenta uninterrupted for 12 months, beta blocker and ACE inhibitor. Cardiac risk factor modification will be reinforced. Intractable continued every 4 hours full dose and then will be discontinued. Femoral sheath will be removed after that and pressure held. She did have a small hematoma at the end of the case.  NST: 10/2017  Nuclear stress EF: 60%. No wall motion abnormalities.  The left ventricular ejection fraction is normal (55-65%).  There was no ST segment deviation noted during stress.  Defect 1: There is a medium defect of severe severity present in the mid inferior, mid inferolateral, apical inferior and apical lateral location.  Findings consistent with prior myocardial infarction.  This is an intermediate risk study. No ischemia identified  Patient Profile     52 y.o. female with a history of CAD status post NSTEMI and DES intervention to the circumflex in January with residual 70% LAD stenosis, hyperlipidemia, stable pulmonary nodules, and tobacco use.  She presents with recurring chest pain.  Assessment & Plan    1. Recurrent chest pain with typical and atypical features for unstable angina: -cath with patent circumflex stents and no significant LAD or RCA stenosis pain would appear To be non cardiac RFA A no hematoma continue medical RX Outpatient f/u [facility] this am  2.  Hyperlipidemia: -Lipid panel 12/02/2017, CHO- 112, HDL-37, LDL-54,Trig- 104 -Continue statin  3.  Tobacco abuse: -Smoking cessation strongly encouraged  Jenkins Rouge  Please consult [email or web address] for contact info under Cardiology/STEMI.

## 2017-12-24 NOTE — Progress Notes (Signed)
Discharge instructions reviewed with pt. Pt has no questions at this time. Pt states she will schedule a follow up appointment with her PCP. Pt states she has some lisinopril at home. Pt denies any pain and states she is ready for d/c.

## 2018-01-02 ENCOUNTER — Encounter (HOSPITAL_COMMUNITY): Payer: Self-pay | Admitting: Cardiovascular Disease

## 2018-01-06 ENCOUNTER — Ambulatory Visit: Payer: Self-pay | Admitting: Family Medicine

## 2018-01-08 ENCOUNTER — Other Ambulatory Visit: Payer: Self-pay | Admitting: Family Medicine

## 2018-01-08 DIAGNOSIS — R921 Mammographic calcification found on diagnostic imaging of breast: Secondary | ICD-10-CM

## 2018-01-16 NOTE — Discharge Summary (Signed)
Physician Discharge Summary  AIMAR SHREWSBURY SJG:[id number] DOB: 1966-02-20 DOA: 12/20/2017  PCP: Shawnee Knapp, MD  Admit date: 12/20/2017 Discharge date: 12/24/2017  Time spent: 35 minutes  Recommendations for Outpatient Follow-up:  1. Dr.Berry in 68month 2. PCP in 1 week 3. Rafael Bihari for Sarcoidosis   Discharge Diagnoses:  Principal Problem:   Chest pain, rule out acute myocardial infarction Active Problems:   Hyperlipidemia with target LDL less than 70   CAD (coronary artery disease)   Tobacco abuse  Discharge Condition: stable  Diet recommendation: heart healthy  Filed Weights   12/22/17 0439 12/23/17 0534 12/24/17 0346  Weight: 79.5 kg 79.3 kg 80.4 kg    History of present illness:  52 y.o. female with a history of CAD status post NSTEMI and DES intervention to the circumflex in January with residual 70% LAD stenosis, hyperlipidemia, stable pulmonary nodules, and tobacco use. She presented with recurring chest pain.  Hospital Course:   Unstable angina/known CAD -History of drug-eluting stent to circumflex in 06/2017, Had a 70% LAD lesion at the time -admitted with atypical chest pain, troponins negative, continues to have intermittent chest discomfort/atypical -treated with aspirin, brilinta, beta blocker, statin, on heparin gtt -Cardiology consulted, underwent left heart catheterization which showed patent circumflex stents and no significant LAD or RCA stenosis  -Cards recommended to continue current medical management and FU with Dr.Berry in 1 month   remote history of sarcoidosis -Briefly treated with steroids in [place] 15-20 years ago -Refered to pulmonary as outpatient at discharge  Tobacco abuse -Counseled  Procedures:  LHC: cath with patent circumflex stents and no significant LAD or RCA stenosis   Consultations:  Cardiology  Discharge Exam: Vitals:   12/24/17 0847 12/24/17 1035  BP: 111/70 130/84  Pulse:  75  Resp:    Temp:  98.6 F  (37 C)  SpO2:  100%    General: AAOx3 Cardiovascular: S1S2/RRR Respiratory: CTAB  Discharge Instructions   Discharge Instructions    Diet - low sodium heart healthy   Complete by:  As directed    Increase activity slowly   Complete by:  As directed      Allergies as of 12/24/2017      Reactions   Hydrocodone Hives, Itching      Medication List    TAKE these medications   aspirin EC 81 MG tablet Take 81 mg by mouth daily.   atorvastatin 80 MG tablet Commonly known as:  LIPITOR Take 1 tablet (80 mg total) by mouth daily. What changed:  when to take this   lisinopril 2.5 MG tablet Commonly known as:  PRINIVIL,ZESTRIL Take 1 tablet (2.5 mg total) by mouth daily.   metoprolol tartrate 25 MG tablet Commonly known as:  LOPRESSOR Take 0.5 tablets (12.5 mg total) by mouth 2 (two) times daily.   nitroGLYCERIN 0.4 MG SL tablet Commonly known as:  NITROSTAT Place 1 tablet (0.4 mg total) under the tongue every 5 (five) minutes x 3 doses as needed for chest pain.   ticagrelor 90 MG Tabs tablet Commonly known as:  BRILINTA Take 1 tablet (90 mg total) by mouth 2 (two) times daily.      Allergies  Allergen Reactions  . Hydrocodone Hives and Itching      The results of significant diagnostics from this hospitalization (including imaging, microbiology, ancillary and laboratory) are listed below for reference.    Significant Diagnostic Studies: Dg Chest 2 View  Result Date: 12/20/2017 CLINICAL DATA:  52 y/o  F;  chest pain and shortness of breath today. EXAM: CHEST - 2 VIEW COMPARISON:  06/30/2017 CT chest and chest radiograph FINDINGS: Stable heart size and mediastinal contours are within normal limits. Small pulmonary nodules better characterized on prior CT of chest. No consolidation, effusion, or pneumothorax. The visualized skeletal structures are unremarkable. IMPRESSION: No acute pulmonary process identified. Electronically Signed   By: Kristine Garbe M.D.    On: 12/20/2017 19:50    Microbiology: No results found for this or any previous visit (from the past 240 hour(s)).   Labs: Basic Metabolic Panel: No results for input(s): NA, K, CL, CO2, GLUCOSE, BUN, CREATININE, CALCIUM, MG, PHOS in the last 168 hours. Liver Function Tests: No results for input(s): AST, ALT, ALKPHOS, BILITOT, PROT, ALBUMIN in the last 168 hours. No results for input(s): LIPASE, AMYLASE in the last 168 hours. No results for input(s): AMMONIA in the last 168 hours. CBC: No results for input(s): WBC, NEUTROABS, HGB, HCT, MCV, PLT in the last 168 hours. Cardiac Enzymes: No results for input(s): CKTOTAL, CKMB, CKMBINDEX, TROPONINI in the last 168 hours. BNP: BNP (last 3 results) No results for input(s): BNP in the last 8760 hours.  ProBNP (last 3 results) No results for input(s): PROBNP in the last 8760 hours.  CBG: No results for input(s): GLUCAP in the last 168 hours.     Signed:  Domenic Polite MD.  [facility] 01/16/2018, 11:50 AM

## 2018-03-17 ENCOUNTER — Ambulatory Visit
Admission: RE | Admit: 2018-03-17 | Discharge: 2018-03-17 | Disposition: A | Discharge status: Home / Self Care | Payer: Commercial Managed Care - PPO | Source: Ambulatory Visit | Attending: Family Medicine | Admitting: Family Medicine

## 2018-03-17 ENCOUNTER — Other Ambulatory Visit: Payer: Self-pay | Admitting: Family Medicine

## 2018-03-17 DIAGNOSIS — R928 Other abnormal and inconclusive findings on diagnostic imaging of breast: Secondary | ICD-10-CM

## 2018-03-17 DIAGNOSIS — R921 Mammographic calcification found on diagnostic imaging of breast: Secondary | ICD-10-CM

## 2018-03-17 DIAGNOSIS — N6489 Other specified disorders of breast: Secondary | ICD-10-CM | POA: Diagnosis not present

## 2018-03-24 ENCOUNTER — Other Ambulatory Visit: Payer: Self-pay | Admitting: Family Medicine

## 2018-03-24 ENCOUNTER — Ambulatory Visit
Admission: RE | Admit: 2018-03-24 | Discharge: 2018-03-24 | Disposition: A | Discharge status: Home / Self Care | Payer: Commercial Managed Care - PPO | Source: Ambulatory Visit | Attending: Family Medicine | Admitting: Family Medicine

## 2018-03-24 DIAGNOSIS — R921 Mammographic calcification found on diagnostic imaging of breast: Secondary | ICD-10-CM | POA: Diagnosis not present

## 2018-03-24 DIAGNOSIS — D241 Benign neoplasm of right breast: Secondary | ICD-10-CM | POA: Diagnosis not present

## 2018-03-24 HISTORY — PX: BREAST BIOPSY: SHX20

## 2018-03-25 ENCOUNTER — Encounter (HOSPITAL_COMMUNITY): Payer: Self-pay | Admitting: *Deleted

## 2018-03-25 ENCOUNTER — Other Ambulatory Visit: Payer: Self-pay

## 2018-03-25 ENCOUNTER — Emergency Department (HOSPITAL_COMMUNITY): Payer: Commercial Managed Care - PPO

## 2018-03-25 ENCOUNTER — Emergency Department (HOSPITAL_COMMUNITY)
Admission: EM | Admit: 2018-03-25 | Discharge: 2018-03-26 | Disposition: A | Discharge status: Home / Self Care | Payer: Commercial Managed Care - PPO | Attending: Emergency Medicine | Admitting: Emergency Medicine

## 2018-03-25 DIAGNOSIS — R079 Chest pain, unspecified: Secondary | ICD-10-CM | POA: Diagnosis not present

## 2018-03-25 DIAGNOSIS — I2510 Atherosclerotic heart disease of native coronary artery without angina pectoris: Secondary | ICD-10-CM | POA: Insufficient documentation

## 2018-03-25 DIAGNOSIS — Z79899 Other long term (current) drug therapy: Secondary | ICD-10-CM | POA: Insufficient documentation

## 2018-03-25 DIAGNOSIS — M25532 Pain in left wrist: Secondary | ICD-10-CM

## 2018-03-25 DIAGNOSIS — F17210 Nicotine dependence, cigarettes, uncomplicated: Secondary | ICD-10-CM | POA: Insufficient documentation

## 2018-03-25 DIAGNOSIS — Z7982 Long term (current) use of aspirin: Secondary | ICD-10-CM | POA: Diagnosis not present

## 2018-03-25 DIAGNOSIS — R0789 Other chest pain: Secondary | ICD-10-CM | POA: Diagnosis not present

## 2018-03-25 LAB — CBC
HCT: 40.2 % (ref 36.0–46.0)
Hemoglobin: 13.5 g/dL (ref 12.0–15.0)
MCH: 29.7 pg (ref 26.0–34.0)
MCHC: 33.6 g/dL (ref 30.0–36.0)
MCV: 88.5 fL (ref 80.0–100.0)
NRBC: 0 % (ref 0.0–0.2)
PLATELETS: 346 10*3/uL (ref 150–400)
RBC: 4.54 MIL/uL (ref 3.87–5.11)
RDW: 12.8 % (ref 11.5–15.5)
WBC: 9.5 10*3/uL (ref 4.0–10.5)

## 2018-03-25 LAB — BASIC METABOLIC PANEL
Anion gap: 6 (ref 5–15)
BUN: 9 mg/dL (ref 6–20)
CALCIUM: 9.6 mg/dL (ref 8.9–10.3)
CHLORIDE: 109 mmol/L (ref 98–111)
CO2: 20 mmol/L — ABNORMAL LOW (ref 22–32)
CREATININE: 0.75 mg/dL (ref 0.44–1.00)
Glucose, Bld: 118 mg/dL — ABNORMAL HIGH (ref 70–99)
Potassium: 3.9 mmol/L (ref 3.5–5.1)
SODIUM: 135 mmol/L (ref 135–145)

## 2018-03-25 LAB — I-STAT TROPONIN, ED: TROPONIN I, POC: 0 ng/mL (ref 0.00–0.08)

## 2018-03-25 NOTE — ED Triage Notes (Signed)
Pt started having L sided head pain radiating to her neck then increased pain down L arm with mild swelling noted to L hand. Also c/o left sided chest pain radiating down arm as well that started today. Hx of MI in January, reports pain is similar to previous MI

## 2018-03-26 ENCOUNTER — Emergency Department (HOSPITAL_COMMUNITY): Payer: Commercial Managed Care - PPO

## 2018-03-26 DIAGNOSIS — M7989 Other specified soft tissue disorders: Secondary | ICD-10-CM | POA: Diagnosis not present

## 2018-03-26 DIAGNOSIS — M25532 Pain in left wrist: Secondary | ICD-10-CM | POA: Diagnosis not present

## 2018-03-26 LAB — I-STAT TROPONIN, ED: Troponin i, poc: 0.01 ng/mL (ref 0.00–0.08)

## 2018-03-26 LAB — URIC ACID: Uric Acid, Serum: 5.8 mg/dL (ref 2.5–7.1)

## 2018-03-26 MED ORDER — NAPROXEN 500 MG PO TABS: 500.0000 mg | ORAL_TABLET | Freq: Two times a day (BID) | ORAL | 0 refills | Status: DC

## 2018-03-26 MED ORDER — OXYCODONE-ACETAMINOPHEN 5-325 MG PO TABS
1.0000 | ORAL_TABLET | Freq: Once | ORAL | Status: AC
  Administered 2018-03-26: 1 via ORAL
  Filled 2018-03-26: qty 1

## 2018-03-26 MED ORDER — OXYCODONE-ACETAMINOPHEN 5-325 MG PO TABS: 1.0000 | ORAL_TABLET | ORAL | 0 refills | Status: DC | PRN

## 2018-03-26 MED ORDER — KETOROLAC TROMETHAMINE 30 MG/ML IJ SOLN: 30.0000 mg | Freq: Once | INTRAMUSCULAR | Status: DC

## 2018-03-26 MED ORDER — KETOROLAC TROMETHAMINE 60 MG/2ML IM SOLN
60.0000 mg | Freq: Once | INTRAMUSCULAR | Status: AC
  Administered 2018-03-26: 60 mg via INTRAMUSCULAR
  Filled 2018-03-26: qty 2

## 2018-03-26 MED ORDER — ASPIRIN 81 MG PO CHEW
324.0000 mg | CHEWABLE_TABLET | Freq: Once | ORAL | Status: AC
  Administered 2018-03-26: 324 mg via ORAL
  Filled 2018-03-26: qty 4

## 2018-03-26 NOTE — ED Notes (Addendum)
PT states understanding of care given, follow up care, and medication prescribed. Pt has no more questions at this time. Pt ambulated from the ed to her car with a steady gait.

## 2018-03-26 NOTE — Discharge Instructions (Addendum)
Take the prescribed medication as directed. Follow-up with Dr. Brigitte Pulse about your wrist. Follow-up with Dr. Gwenlyn Found in clinic. Return to the ED for new or worsening symptoms.

## 2018-03-26 NOTE — ED Provider Notes (Signed)
[facility] EMERGENCY DEPARTMENT Provider Note   CSN: [id number] Arrival date & time: 03/25/18  2250     History   Chief Complaint Chief Complaint  Patient presents with  . Chest Pain  . Arm Pain    HPI [name] is a 52 y.o. female.  The history is provided by the patient and medical records.  Chest Pain    Arm Pain  Associated symptoms include chest pain.    52 y.o. F with hx of anemia, fibroids, HLP, STEMI s/p stenting to LCx in Jan 2019, sarcoidosis, presenting to the ED for chest pain.  Patient reports she started having pain yesterday morning, has been somewhat constant in nature.  States she feels it in the left side of her chest, left axilla/upper arm, and left neck.  She reports associated SOB and sweating, unsure if worse with exertion but continues to have pain even at rest.  She describes it as a dull ache and pressure sensation.  She denies cough, fever, chills.   She did have a mammogram with needle biopsy yesterday, was told it was normal.  States it did hurt a little during procedure, was given some tylenol at the facility.  Patient is followed by cardiology, Dr. Gwenlyn Found.  Reports compliance with her medications.  Patient also with left wrist pain.  States this has been ongoing for about 2 days.  States a lot of pain with movement of the wrist.  Denies injury, trauma, or fall.  Does not sleep on her left side.  No fevers.  Denies numbness/weakness of left arm/hand.  Denies hx of gout.  States she has been eating a lot of shrimp.  No history of IV drug use.  No bug bites or plant exposures.  Past Medical History:  Diagnosis Date  . Anemia   . Fibroid   . Hyperlipidemia   . NSTEMI (non-ST elevated myocardial infarction) ([place]) 06/2017   s/p DES to LCX 07/02/17  . Sarcoidosis     Patient Active Problem List   Diagnosis Date Noted  . Chest pain, rule out acute myocardial infarction 12/20/2017  . CAD (coronary artery disease) 12/20/2017  .  Lung nodule, multiple 07/04/2017  . Post PTCA 07/04/2017  . Abnormal EKG   . Acute chest pain   . Femoral artery hematoma complicating cardiac catheterization 07/02/2017  . Hyperlipidemia with target LDL less than 70 07/02/2017  . Tobacco abuse 07/02/2017  . NSTEMI (non-ST elevated myocardial infarction) ([place])   . Knee LCL sprain 03/20/2017  . Rectal bleeding 10/18/2016  . Special screening for malignant neoplasms, colon 10/18/2016  . Vitamin D deficiency 04/16/2015    Past Surgical History:  Procedure Laterality Date  . ABDOMINAL HYSTERECTOMY    . CESAREAN SECTION  1987  . CORONARY/GRAFT ACUTE MI REVASCULARIZATION N/A 06/30/2017   Procedure: Coronary/Graft Acute MI Revascularization;  Surgeon: Lorretta Harp, MD;  Location: [facility];  Service: Cardiovascular;  Laterality: N/A;  . FOREARM FRACTURE SURGERY Right 1977  . FRACTURE SURGERY    . LEFT HEART CATH AND CORONARY ANGIOGRAPHY N/A 06/30/2017   Procedure: LEFT HEART CATH AND CORONARY ANGIOGRAPHY;  Surgeon: Lorretta Harp, MD;  Location: [facility];  Service: Cardiovascular;  Laterality: N/A;  . LEFT HEART CATH AND CORONARY ANGIOGRAPHY N/A 12/23/2017   Procedure: LEFT HEART CATH AND CORONARY ANGIOGRAPHY;  Surgeon: Lorretta Harp, MD;  Location: [facility];  Service: Cardiovascular;  Laterality: N/A;  . TUBAL LIGATION  1990     OB History   None      Home Medications    Prior to Admission medications   Medication Sig Start Date End Date Taking? Authorizing Provider  aspirin EC 81 MG tablet Take 81 mg by mouth daily.    [provider]  atorvastatin (LIPITOR) 80 MG tablet Take 1 tablet (80 mg total) by mouth daily. Patient taking differently: Take 80 mg by mouth at bedtime.  12/02/17 03/02/18  Shawnee Knapp, MD  lisinopril (PRINIVIL,ZESTRIL) 2.5 MG tablet Take 1 tablet (2.5 mg total) by mouth daily. 12/02/17   Shawnee Knapp, MD  metoprolol tartrate (LOPRESSOR) 25 MG tablet Take 0.5 tablets  (12.5 mg total) by mouth 2 (two) times daily. 12/02/17   Shawnee Knapp, MD  nitroGLYCERIN (NITROSTAT) 0.4 MG SL tablet Place 1 tablet (0.4 mg total) under the tongue every 5 (five) minutes x 3 doses as needed for chest pain. 07/04/17   Kathyrn Drown D, NP  ticagrelor (BRILINTA) 90 MG TABS tablet Take 1 tablet (90 mg total) by mouth 2 (two) times daily. 07/04/17   Tommie Raymond, NP    Family History Family History  Problem Relation Age of Onset  . Diabetes Mother   . Stroke Mother 77  . Hypertension Mother   . Hyperlipidemia Father   . Stroke Father 70  . Hypertension Father   . Breast cancer Paternal Grandmother     Social History Social History   Tobacco Use  . Smoking status: Current Every Day Smoker    Packs/day: 1.00    Years: 29.00    Pack years: 29.00    Types: Cigarettes  . Smokeless tobacco: Never Used  Substance Use Topics  . Alcohol use: Yes    Comment: 12/20/2017 "couple drinks once/yr"  . Drug use: Never     Allergies   Hydrocodone   Review of Systems Review of Systems  Cardiovascular: Positive for chest pain.  Musculoskeletal: Positive for arthralgias.  All other systems reviewed and are negative.    Physical Exam Updated Vital Signs BP 99/73 (BP Location: Right Arm)   Pulse 87   Temp 99.7 F (37.6 C)   Resp (!) 25   Ht 5' 4.5" (1.638 m)   Wt 81.6 kg   SpO2 99%   BMI 30.42 kg/m   Physical Exam  Constitutional: She is oriented to person, place, and time. She appears well-developed and well-nourished.  HENT:  Head: Normocephalic and atraumatic.  Mouth/Throat: Oropharynx is clear and moist.  Eyes: Pupils are equal, round, and reactive to light. Conjunctivae and EOM are normal.  Neck: Normal range of motion.  Cardiovascular: Normal rate, regular rhythm and normal heart sounds.  Pulmonary/Chest: Effort normal and breath sounds normal. She has no decreased breath sounds. She has no wheezes. She has no rales.  Some tenderness of left lateral  chest wall extending into   Abdominal: Soft. Bowel sounds are normal.  Musculoskeletal: Normal range of motion.  Left wrist is mildly swollen, slightly erythematous and warm to the touch; she has a lot of pain with even light touch of the wrist or any attempted movement; no apparent fluctuance or tissue crepitus No pain with ROM of the elbow; no swelling or redness of the upper arm or forearm; no palpable cords Arm is NVI  Neurological: She is alert and oriented to person, place, and time.  Skin: Skin is warm and dry.  Psychiatric: She has a normal mood and affect.  Nursing  note and vitals reviewed.    ED Treatments / Results  Labs (all labs ordered are listed, but only abnormal results are displayed) Labs Reviewed  BASIC METABOLIC PANEL - Abnormal; Notable for the following components:      Result Value   CO2 20 (*)    Glucose, Bld 118 (*)    All other components within normal limits  CBC  URIC ACID  I-STAT TROPONIN, ED    EKG EKG Interpretation  Date/Time:  Tuesday March 25 2018 22:57:34 EDT Ventricular Rate:  95 PR Interval:  162 QRS Duration: 72 QT Interval:  344 QTC Calculation: 432 R Axis:   30 Text Interpretation:  Normal sinus rhythm Low voltage QRS Cannot rule out Anterior infarct , age undetermined Abnormal ECG Nonspecific T wave abnormality When compared with ECG of 12/22/2017, HEART RATE has increased Confirmed by Delora Fuel ([id number]) on 03/25/2018 11:22:40 PM   Radiology Dg Chest 2 View  Result Date: 03/25/2018 CLINICAL DATA:  Left-sided chest pain EXAM: CHEST - 2 VIEW COMPARISON:  12/20/2017 FINDINGS: The heart size and mediastinal contours are within normal limits. Both lungs are clear. Mild scoliosis of the spine. IMPRESSION: No active cardiopulmonary disease. Electronically Signed   By: Donavan Foil M.D.   On: 03/25/2018 23:47   Dg Wrist Complete Left  Result Date: 03/26/2018 CLINICAL DATA:  Left wrist pain and swelling for 2 days. No reported  injury. EXAM: LEFT WRIST - COMPLETE 3+ VIEW COMPARISON:  None. FINDINGS: Dorsal soft tissue swelling over the left wrist. No evidence of acute fracture or subluxation. No focal bone lesion or bone destruction. Bone cortex and trabecular architecture appear intact. No radiopaque soft tissue foreign bodies. IMPRESSION: Soft tissue swelling. No acute bony abnormalities. Electronically Signed   By: Lucienne Capers M.D.   On: 03/26/2018 01:13    Mm Rt Breast Bx W Loc Dev 1st Lesion Image Bx Spec Stereo Guide  Addendum Date: 03/25/2018   ADDENDUM REPORT: 03/25/2018 12:55 ADDENDUM: Pathology revealed FIBROCYSTIC CHANGES. FIBROADENOMA. MICROCALCIFICATIONS ARE ASSOCIATED WITH THESE PROCESSES of RIGHT breast, upper outer quadrant calcifications. This was found to be concordant by Dr. Abelardo Diesel. Pathology results were discussed with the patient by telephone. The patient reported doing well after the biopsy with tenderness at the site. Post biopsy instructions and care were reviewed and questions were answered. The patient was encouraged to call [facility] for any additional concerns. The patient was instructed to return for LEFT breast ultrasound in 6 months and informed a reminder notice would be sent regarding this appointment. Pathology results reported by Roselind Messier, RN on 03/25/2018. Electronically Signed   By: Abelardo Diesel M.D.   On: 03/25/2018 12:55   Result Date: 03/25/2018 CLINICAL DATA:  Indeterminate microcalcifications of right breast for biopsy EXAM: RIGHT BREAST STEREOTACTIC CORE NEEDLE BIOPSY COMPARISON:  Previous exams. FINDINGS: The patient and I discussed the procedure of stereotactic-guided biopsy including benefits and alternatives. We discussed the high likelihood of a successful procedure. We discussed the risks of the procedure including infection, bleeding, tissue injury, clip migration, and inadequate sampling. Informed written consent was given. The usual  time out protocol was performed immediately prior to the procedure. Using sterile technique and 1% Lidocaine as local anesthetic, under stereotactic guidance, a 9 gauge vacuum assisted device was used to perform core needle biopsy of calcifications in the slight medial and upper right breast using a cranial approach. Specimen radiograph was performed showing inclusion of calcifications of concern. Specimens with calcifications  are identified for pathology. Lesion quadrant: Upper-outer quadrant At the conclusion of the procedure, a ciol tissue marker clip was deployed into the biopsy cavity. Follow-up 2-view mammogram was performed and dictated separately. IMPRESSION: Stereotactic-guided biopsy of right breast. No apparent complications. Electronically Signed: By: Abelardo Diesel M.D. On: 03/24/2018 10:41    Procedures Procedures (including critical care time)  Medications Ordered in ED Medications - No data to display   Initial Impression / Assessment and Plan / ED Course  I have reviewed the triage vital signs and the nursing notes.  Pertinent labs & imaging results that were available during my care of the patient were reviewed by me and considered in my medical decision making (see chart for details).  52 year old female here with chest pain.  Has been somewhat constant since yesterday.  She reports associated shortness of breath and diaphoresis.  Has not noticed an exertional component.  She is afebrile and nontoxic.  Does have some tenderness of the left lateral chest wall extending into the axilla.  Does report she had a mammogram yesterday as well, findings of fibrocystic changes on fine needle biopsy.  EKG with some nonspecific T wave changes.  Labs overall reassuring, troponin negative.  Chest x-ray is clear.  Case was discussed with cardiology, Dr. De Nurse-- recommends delta trop and if negative, d/c home with office follow-up.  Delta trop negative.  Appears stable from cardiac standpoint.   Had some pain after her procedure yesterday and given reproducible nature of pain in ED, this may be contributing to her symptoms.  Will have her follow-up with Dr. Gwenlyn Found in clinic.  Patient also has some left wrist pain this is been ongoing for a few days.  States it feels swollen and hand is starting to swell.  She denies any falls or trauma.  No recent bug bites.  Left wrist does appear diffusely swollen and she has significant pain with even light touch or any attempted movement.  Her hand is neurovascularly intact.  She does not have any upper arm swelling or palpable cords.  No history of DVT or PE.  Given appearance, concern this may represent gout.  She does not have any history of same but has been eating a lot of shrimp.  She has no history of IV drug use or cancer.  Does not appear to represent septic joint given lack of fever normal white blood cell count.  Wrist films with soft tissue edema but no acute bony findings.  Uric acid is normal.  Still could possibly represent gout vs other inflammatory cause.  Continue to have low suspicion for DVT, cellulitis, or septic joint.  Treated here with toradol and percocet with improvement of pain.  Will d/c home with wrist splint and pain control.  Close follow-up with PCP for re-check of wrist.  Discussed plan with patient, she acknowledged understanding and agreed with plan of care.  Return precautions given for new or worsening symptoms.  Patient seen and evaluated with attending physician, Dr. Dina Rich, who agrees with assessment and plan of care.  Final Clinical Impressions(s) / ED Diagnoses   Final diagnoses:  Chest pain in adult  Wrist pain, left    ED Discharge Orders         Ordered    oxyCODONE-acetaminophen (PERCOCET) 5-325 MG tablet  Every 4 hours PRN     03/26/18 0521    naproxen (NAPROSYN) 500 MG tablet  2 times daily with meals     03/26/18 0521  Larene Pickett, PA-C 03/26/18 [id number]    Merryl Hacker,  MD 03/31/18 [id number]

## 2018-04-28 ENCOUNTER — Encounter

## 2018-04-28 ENCOUNTER — Other Ambulatory Visit: Payer: Self-pay

## 2018-04-28 ENCOUNTER — Encounter: Payer: Self-pay | Admitting: Family Medicine

## 2018-04-28 ENCOUNTER — Ambulatory Visit (INDEPENDENT_AMBULATORY_CARE_PROVIDER_SITE_OTHER): Payer: Commercial Managed Care - PPO | Admitting: Family Medicine

## 2018-04-28 VITALS — BP 122/84 | HR 86 | Temp 98.0°F | Resp 16 | Ht 65.0 in | Wt 171.0 lb

## 2018-04-28 DIAGNOSIS — R102 Pelvic and perineal pain: Secondary | ICD-10-CM

## 2018-04-28 DIAGNOSIS — N3000 Acute cystitis without hematuria: Secondary | ICD-10-CM

## 2018-04-28 DIAGNOSIS — Z113 Encounter for screening for infections with a predominantly sexual mode of transmission: Secondary | ICD-10-CM

## 2018-04-28 DIAGNOSIS — N898 Other specified noninflammatory disorders of vagina: Secondary | ICD-10-CM

## 2018-04-28 DIAGNOSIS — R399 Unspecified symptoms and signs involving the genitourinary system: Secondary | ICD-10-CM | POA: Diagnosis not present

## 2018-04-28 DIAGNOSIS — Z23 Encounter for immunization: Secondary | ICD-10-CM

## 2018-04-28 DIAGNOSIS — Z124 Encounter for screening for malignant neoplasm of cervix: Secondary | ICD-10-CM | POA: Diagnosis not present

## 2018-04-28 LAB — POCT WET + KOH PREP: TRICH BY WET PREP: ABSENT

## 2018-04-28 LAB — POC HEMOCCULT BLD/STL (OFFICE/1-CARD/DIAGNOSTIC): Fecal Occult Blood, POC: NEGATIVE

## 2018-04-28 LAB — POCT CBC
Granulocyte percent: 59.7 %G (ref 37–80)
HCT, POC: 41.6 % — AB (ref 29–41)
Hemoglobin: 13.7 g/dL — AB (ref 9.5–13.5)
LYMPH, POC: 2.5 (ref 0.6–3.4)
MCH: 29.8 pg (ref 27–31.2)
MCHC: 32.8 g/dL (ref 31.8–35.4)
MCV: 90.8 fL (ref 76–111)
MID (CBC): 0.2 (ref 0–0.9)
MPV: 8.6 fL (ref 0–99.8)
POC Granulocyte: 4 (ref 2–6.9)
POC LYMPH %: 37.4 % (ref 10–50)
POC MID %: 2.9 % (ref 0–12)
Platelet Count, POC: 325 10*3/uL (ref 142–424)
RBC: 4.59 M/uL (ref 4.04–5.48)
RDW, POC: 13.5 %
WBC: 6.7 10*3/uL (ref 4.6–10.2)

## 2018-04-28 LAB — POCT URINALYSIS DIP (MANUAL ENTRY)
Bilirubin, UA: NEGATIVE
GLUCOSE UA: NEGATIVE mg/dL
Ketones, POC UA: NEGATIVE mg/dL
Nitrite, UA: POSITIVE — AB
PROTEIN UA: NEGATIVE mg/dL
RBC UA: NEGATIVE
SPEC GRAV UA: 1.02 (ref 1.010–1.025)
UROBILINOGEN UA: 0.2 U/dL
pH, UA: 5.5 (ref 5.0–8.0)

## 2018-04-28 MED ORDER — CEPHALEXIN 500 MG PO CAPS: 500.0000 mg | ORAL_CAPSULE | Freq: Two times a day (BID) | ORAL | 0 refills | Status: DC

## 2018-04-28 MED ORDER — HYDROCORTISONE 2.5 % RE CREA: 1.0000 "application " | TOPICAL_CREAM | Freq: Two times a day (BID) | RECTAL | 0 refills | Status: DC

## 2018-04-28 NOTE — Patient Instructions (Addendum)
If you have lab work done today you will be contacted with your lab results within the next 2 weeks.  If you have not heard from [place] then please contact us. The fastest way to get your results is to register for My Chart.   IF you received an x-ray today, you will receive an invoice from [facility]. Please contact [facility] at [phone] with questions or concerns regarding your invoice.   IF you received labwork today, you will receive an invoice from [facility]. Please contact [facility] at [phone] with questions or concerns regarding your invoice.   Our billing staff will not be able to assist you with questions regarding bills from these companies.  You will be contacted with the lab results as soon as they are available. The fastest way to get your results is to activate your My Chart account. Instructions are located on the last page of this paperwork. If you have not heard from [place] regarding the results in 2 weeks, please contact this office.     Urinary Tract Infection, Adult A urinary tract infection (UTI) is an infection of any part of the urinary tract, which includes the kidneys, ureters, bladder, and urethra. These organs make, store, and get rid of urine in the body. UTI can be a bladder infection (cystitis) or kidney infection (pyelonephritis). What are the causes? This infection may be caused by fungi, viruses, or bacteria. Bacteria are the most common cause of UTIs. This condition can also be caused by repeated incomplete emptying of the bladder during urination. What increases the risk? This condition is more likely to develop if:  You ignore your need to urinate or hold urine for long periods of time.  You do not empty your bladder completely during urination.  You wipe back to front after urinating or having a bowel movement, if you are female.  You are uncircumcised, if you are female.  You are constipated.  You have a urinary catheter  that stays in place (indwelling).  You have a weak defense (immune) system.  You have a medical condition that affects your bowels, kidneys, or bladder.  You have diabetes.  You take antibiotic medicines frequently or for long periods of time, and the antibiotics no longer work well against certain types of infections (antibiotic resistance).  You take medicines that irritate your urinary tract.  You are exposed to chemicals that irritate your urinary tract.  You are female.  What are the signs or symptoms? Symptoms of this condition include:  Fever.  Frequent urination or passing small amounts of urine frequently.  Needing to urinate urgently.  Pain or burning with urination.  Urine that smells bad or unusual.  Cloudy urine.  Pain in the lower abdomen or back.  Trouble urinating.  Blood in the urine.  Vomiting or being less hungry than normal.  Diarrhea or abdominal pain.  Vaginal discharge, if you are female.  How is this diagnosed? This condition is diagnosed with a medical history and physical exam. You will also need to provide a urine sample to test your urine. Other tests may be done, including:  Blood tests.  Sexually transmitted disease (STD) testing.  If you have had more than one UTI, a cystoscopy or imaging studies may be done to determine the cause of the infections. How is this treated? Treatment for this condition often includes a combination of two or more of the following:  Antibiotic medicine.  Other medicines to treat less common  causes of UTI.  Over-the-counter medicines to treat pain.  Drinking enough water to stay hydrated.  Follow these instructions at home:  Take over-the-counter and prescription medicines only as told by your health care provider.  If you were prescribed an antibiotic, take it as told by your health care provider. Do not stop taking the antibiotic even if you start to feel better.  Avoid alcohol, caffeine,  tea, and carbonated beverages. They can irritate your bladder.  Drink enough fluid to keep your urine clear or pale yellow.  Keep all follow-up visits as told by your health care provider. This is important.  Make sure to: ? Empty your bladder often and completely. Do not hold urine for long periods of time. ? Empty your bladder before and after sex. ? Wipe from front to back after a bowel movement if you are female. Use each tissue one time when you wipe. Contact a health care provider if:  You have back pain.  You have a fever.  You feel nauseous or vomit.  Your symptoms do not get better after 3 days.  Your symptoms go away and then return. Get help right away if:  You have severe back pain or lower abdominal pain.  You are vomiting and cannot keep down any medicines or water. This information is not intended to replace advice given to you by your health care provider. Make sure you discuss any questions you have with your health care provider. Document Released: 03/07/2005 Document Revised: 11/09/2015 Document Reviewed: 04/18/2015 Elsevier Interactive Patient Education  [employer].

## 2018-04-28 NOTE — Progress Notes (Signed)
Subjective:    Patient: [name]  DOB: 04/21/1966; 52 y.o.   MRN: [id number]  Chief Complaint  Patient presents with  . Dysuria    x 1 week with vaginal discharge     HPI A little odor and discomfort with mucous d/c for past wk constantly but now pressure/pulling while urinating. Lower back pain. No f/c, n/v, flank pain. Hemorrhoids flaring up itching and burning so is pushing them back up inside but not using anything else. Stools soft, regulary. No constipation  Had mammogram followed by Rt breast US and  biopsy for calcifications which was benign fibroadenoma.  Went to hosp when arm swelled up from no where an dwas told was probably gout lats mo.  Uric acid was normal at 5.8  Medical History Past Medical History:  Diagnosis Date  . Anemia   . Fibroid   . Hyperlipidemia   . NSTEMI (non-ST elevated myocardial infarction) ([place]) 06/2017   s/p DES to LCX 07/02/17  . Sarcoidosis    Past Surgical History:  Procedure Laterality Date  . ABDOMINAL HYSTERECTOMY    . CESAREAN SECTION  1987  . CORONARY/GRAFT ACUTE MI REVASCULARIZATION N/A 06/30/2017   Procedure: Coronary/Graft Acute MI Revascularization;  Surgeon: Lorretta Harp, MD;  Location: [facility];  Service: Cardiovascular;  Laterality: N/A;  . FOREARM FRACTURE SURGERY Right 1977  . FRACTURE SURGERY    . LEFT HEART CATH AND CORONARY ANGIOGRAPHY N/A 06/30/2017   Procedure: LEFT HEART CATH AND CORONARY ANGIOGRAPHY;  Surgeon: Lorretta Harp, MD;  Location: [facility];  Service: Cardiovascular;  Laterality: N/A;  . LEFT HEART CATH AND CORONARY ANGIOGRAPHY N/A 12/23/2017   Procedure: LEFT HEART CATH AND CORONARY ANGIOGRAPHY;  Surgeon: Lorretta Harp, MD;  Location: [facility];  Service: Cardiovascular;  Laterality: N/A;  . TUBAL LIGATION  1990   Current Outpatient Medications on File Prior to Visit  Medication Sig Dispense Refill  . aspirin EC 81 MG tablet Take 81 mg by mouth daily.    Marland Kitchen  atorvastatin (LIPITOR) 80 MG tablet Take 1 tablet (80 mg total) by mouth daily. (Patient taking differently: Take 80 mg by mouth at bedtime. ) 90 tablet 3  . lisinopril (PRINIVIL,ZESTRIL) 2.5 MG tablet Take 1 tablet (2.5 mg total) by mouth daily. 90 tablet 3  . metoprolol tartrate (LOPRESSOR) 25 MG tablet Take 0.5 tablets (12.5 mg total) by mouth 2 (two) times daily. 90 tablet 2  . naproxen (NAPROSYN) 500 MG tablet Take 1 tablet (500 mg total) by mouth 2 (two) times daily with a meal. 30 tablet 0  . nitroGLYCERIN (NITROSTAT) 0.4 MG SL tablet Place 1 tablet (0.4 mg total) under the tongue every 5 (five) minutes x 3 doses as needed for chest pain. 25 tablet 0  . oxyCODONE-acetaminophen (PERCOCET) 5-325 MG tablet Take 1 tablet by mouth every 4 (four) hours as needed. 12 tablet 0  . ticagrelor (BRILINTA) 90 MG TABS tablet Take 1 tablet (90 mg total) by mouth 2 (two) times daily. 180 tablet 2   No current facility-administered medications on file prior to visit.    Allergies  Allergen Reactions  . Hydrocodone Hives and Itching   Family History  Problem Relation Age of Onset  . Diabetes Mother   . Stroke Mother 29  . Hypertension Mother   . Hyperlipidemia Father   . Stroke Father 44  . Hypertension Father   . Breast cancer Paternal Grandmother    Social History  Socioeconomic History  . Marital status: Widowed    Spouse name: Not on file  . Number of children: 2  . Years of education: Not on file  . Highest education level: Not on file  Occupational History  . Occupation: Chiropodist: [employer]  . Financial resource strain: Not on file  . Food insecurity:    Worry: Not on file    Inability: Not on file  . Transportation needs:    Medical: Not on file    Non-medical: Not on file  Tobacco Use  . Smoking status: Current Every Day Smoker    Packs/day: 1.00    Years: 29.00    Pack years: 29.00    Types: Cigarettes  . Smokeless  tobacco: Never Used  Substance and Sexual Activity  . Alcohol use: Yes    Comment: 12/20/2017 "couple drinks once/yr"  . Drug use: Never  . Sexual activity: Not on file  Lifestyle  . Physical activity:    Days per week: Not on file    Minutes per session: Not on file  . Stress: Not on file  Relationships  . Social connections:    Talks on phone: Not on file    Gets together: Not on file    Attends religious service: Not on file    Active member of club or organization: Not on file    Attends meetings of clubs or organizations: Not on file    Relationship status: Not on file  Other Topics Concern  . Not on file  Social History Narrative  . Not on file   Depression screen [facility] 2/9 04/28/2018 12/20/2017 12/02/2017 09/18/2017 09/18/2017  Decreased Interest 0 0 0 0 0  Down, Depressed, Hopeless 0 0 0 1 0  PHQ - 2 Score 0 0 0 1 0    ROS As noted in HPI  Objective:  BP 122/84   Pulse 86   Temp 98 F (36.7 C) (Oral)   Resp 16   Ht 5\' 5"  (1.651 m)   Wt 171 lb (77.6 kg)   SpO2 100%   BMI 28.46 kg/m  Physical Exam  Constitutional: She is oriented to person, place, and time. She appears well-developed and well-nourished. No distress.  HENT:  Head: Normocephalic and atraumatic.  Right Ear: External ear normal.  Left Ear: External ear normal.  Eyes: Conjunctivae are normal. No scleral icterus.  Neck: Normal range of motion. Neck supple. No thyromegaly present.  Cardiovascular: Normal rate, regular rhythm, normal heart sounds and intact distal pulses.  Pulmonary/Chest: Effort normal and breath sounds normal. No respiratory distress.  Abdominal: Soft. Bowel sounds are normal. She exhibits no distension. There is no tenderness. There is no rebound and no guarding.  Genitourinary: Uterus normal. Pelvic exam was performed with patient supine. There is no rash, tenderness or lesion on the right labia. There is no rash, tenderness or lesion on the left labia. Cervix exhibits no motion  tenderness and no friability. Right adnexum displays no mass, no tenderness and no fullness. Left adnexum displays no mass, no tenderness and no fullness. No erythema or tenderness in the vagina. Vaginal discharge found.  Genitourinary Comments: Pap collected but was unable to visulize cervix. Uterine and Lt>Rt adnexal tenderness.  Musculoskeletal: She exhibits no edema.  Lymphadenopathy:    She has no cervical adenopathy.       Right: No inguinal adenopathy present.       Left: No inguinal adenopathy  present.  Neurological: She is alert and oriented to person, place, and time.  Skin: Skin is warm and dry. She is not diaphoretic. No erythema.  Psychiatric: She has a normal mood and affect. Her behavior is normal.    [facility] Visit on 04/28/2018  Component Date Value Ref Range Status  . Color, UA 04/28/2018 yellow  yellow Final  . Clarity, UA 04/28/2018 cloudy* clear Final  . Glucose, UA 04/28/2018 negative  negative mg/dL Final  . Bilirubin, UA 04/28/2018 negative  negative Final  . Ketones, POC UA 04/28/2018 negative  negative mg/dL Final  . Spec Grav, UA 04/28/2018 1.020  1.010 - 1.025 Final  . Blood, UA 04/28/2018 negative  negative Final  . pH, UA 04/28/2018 5.5  5.0 - 8.0 Final  . Protein Ur, POC 04/28/2018 negative  negative mg/dL Final  . Urobilinogen, UA 04/28/2018 0.2  0.2 or 1.0 E.U./dL Final  . Nitrite, UA 04/28/2018 Positive* Negative Final  . Leukocytes, UA 04/28/2018 Trace* Negative Final  . WBC 04/28/2018 6.7  4.6 - 10.2 K/uL Final  . Lymph, poc 04/28/2018 2.5  0.6 - 3.4 Final  . POC LYMPH PERCENT 04/28/2018 37.4  10 - 50 %L Final  . MID (cbc) 04/28/2018 0.2  0 - 0.9 Final  . POC MID % 04/28/2018 2.9  0 - 12 %M Final  . POC Granulocyte 04/28/2018 4.0  2 - 6.9 Final  . Granulocyte percent 04/28/2018 59.7  37 - 80 %G Final  . RBC 04/28/2018 4.59  4.04 - 5.48 M/uL Final  . Hemoglobin 04/28/2018 13.7* 9.5 - 13.5 g/dL Final  . HCT, POC 04/28/2018 41.6* 29 - 41  % Final  . MCV 04/28/2018 90.8  76 - 111 fL Final  . MCH, POC 04/28/2018 29.8  27 - 31.2 pg Final  . MCHC 04/28/2018 32.8  31.8 - 35.4 g/dL Final  . RDW, POC 04/28/2018 13.5  % Final  . Platelet Count, POC 04/28/2018 325  142 - 424 K/uL Final  . MPV 04/28/2018 8.6  0 - 99.8 fL Final     Assessment & Plan:   1. Urinary symptom or sign   2. Routine screening for STI (sexually transmitted infection)   3. Screening for cervical cancer   4. Pelvic pain in female   5. Vaginal discharge   6. Acute cystitis without hematuria    Patient will continue on current chronic medications other than changes noted above, so ok to refill when needed.   See after visit summary for patient specific instructions.  Orders Placed This Encounter  Procedures  . Urine Culture  . Flu Vaccine QUAD 36+ mos IM  . HIV Antibody (routine testing w rflx)  . RPR  . HCV Ab w/Rflx to Verification  . Interpretation:  . POCT urinalysis dipstick  . POCT Wet + KOH Prep  . POCT CBC  . POC Hemoccult Bld/Stl (1-Cd Office Dx)    Meds ordered this encounter  Medications  . hydrocortisone (ANUSOL-HC) 2.5 % rectal cream    Sig: Place 1 application rectally 2 (two) times daily.    Dispense:  30 g    Refill:  0  . cephALEXin (KEFLEX) 500 MG capsule    Sig: Take 1 capsule (500 mg total) by mouth 2 (two) times daily.    Dispense:  14 capsule    Refill:  0    Patient verbalized to me that they understand the following: diagnosis, what is being done for them, what to expect  and what should be done at home.  Their questions have been answered. They understand that I am unable to predict every possible medication interaction or adverse outcome and that if any unexpected symptoms arise, they should contact us and their pharmacist, as well as never hesitate to seek urgent/emergent care at [facility] or ER if they think it might be warranted.    [name], MD, MPH [facility] [address] [place], [place]  [address] [phone] Office phone  [phone] Office fax  04/28/18 11:06 AM

## 2018-04-29 LAB — RPR: RPR: NONREACTIVE

## 2018-04-29 LAB — HIV ANTIBODY (ROUTINE TESTING W REFLEX): HIV SCREEN 4TH GENERATION: NONREACTIVE

## 2018-04-29 LAB — HCV AB W/RFLX TO VERIFICATION

## 2018-04-29 LAB — HCV INTERPRETATION

## 2018-04-30 LAB — PAP IG, CT-NG, RFX HPV ASCU
Chlamydia, Nuc. Acid Amp: NEGATIVE
GONOCOCCUS BY NUCLEIC ACID AMP: NEGATIVE

## 2018-04-30 LAB — URINE CULTURE

## 2018-05-20 ENCOUNTER — Emergency Department (HOSPITAL_COMMUNITY): Payer: Commercial Managed Care - PPO

## 2018-05-20 ENCOUNTER — Emergency Department (HOSPITAL_COMMUNITY)
Admission: EM | Admit: 2018-05-20 | Discharge: 2018-05-20 | Disposition: A | Discharge status: Home / Self Care | Payer: Commercial Managed Care - PPO | Attending: Emergency Medicine | Admitting: Emergency Medicine

## 2018-05-20 ENCOUNTER — Encounter (HOSPITAL_COMMUNITY): Payer: Self-pay | Admitting: Emergency Medicine

## 2018-05-20 DIAGNOSIS — I2510 Atherosclerotic heart disease of native coronary artery without angina pectoris: Secondary | ICD-10-CM | POA: Diagnosis not present

## 2018-05-20 DIAGNOSIS — F17210 Nicotine dependence, cigarettes, uncomplicated: Secondary | ICD-10-CM | POA: Diagnosis not present

## 2018-05-20 DIAGNOSIS — Z7982 Long term (current) use of aspirin: Secondary | ICD-10-CM | POA: Insufficient documentation

## 2018-05-20 DIAGNOSIS — Z79899 Other long term (current) drug therapy: Secondary | ICD-10-CM | POA: Diagnosis not present

## 2018-05-20 DIAGNOSIS — R079 Chest pain, unspecified: Secondary | ICD-10-CM | POA: Insufficient documentation

## 2018-05-20 DIAGNOSIS — I252 Old myocardial infarction: Secondary | ICD-10-CM | POA: Insufficient documentation

## 2018-05-20 DIAGNOSIS — R05 Cough: Secondary | ICD-10-CM | POA: Diagnosis not present

## 2018-05-20 DIAGNOSIS — Z951 Presence of aortocoronary bypass graft: Secondary | ICD-10-CM | POA: Insufficient documentation

## 2018-05-20 LAB — BASIC METABOLIC PANEL
Anion gap: 11 (ref 5–15)
BUN: 10 mg/dL (ref 6–20)
CALCIUM: 10.5 mg/dL — AB (ref 8.9–10.3)
CO2: 23 mmol/L (ref 22–32)
Chloride: 105 mmol/L (ref 98–111)
Creatinine, Ser: 0.66 mg/dL (ref 0.44–1.00)
GFR calc Af Amer: >60 mL/min (ref >60)
Glucose, Bld: 135 mg/dL — ABNORMAL HIGH (ref 70–99)
Potassium: 4 mmol/L (ref 3.5–5.1)
Sodium: 139 mmol/L (ref 135–145)

## 2018-05-20 LAB — I-STAT TROPONIN, ED
Troponin i, poc: 0.01 ng/mL (ref 0.00–0.08)
Troponin i, poc: 0.01 ng/mL (ref 0.00–0.08)

## 2018-05-20 LAB — CBC
HCT: 42.8 % (ref 36.0–46.0)
Hemoglobin: 14.1 g/dL (ref 12.0–15.0)
MCH: 29.3 pg (ref 26.0–34.0)
MCHC: 32.9 g/dL (ref 30.0–36.0)
MCV: 88.8 fL (ref 80.0–100.0)
PLATELETS: 363 10*3/uL (ref 150–400)
RBC: 4.82 MIL/uL (ref 3.87–5.11)
RDW: 13.1 % (ref 11.5–15.5)
WBC: 9.5 10*3/uL (ref 4.0–10.5)
nRBC: 0 % (ref 0.0–0.2)

## 2018-05-20 LAB — I-STAT BETA HCG BLOOD, ED (MC, WL, AP ONLY): I-stat hCG, quantitative: <5 m[IU]/mL (ref <5)

## 2018-05-20 MED ORDER — BENZONATATE 100 MG PO CAPS: 100.0000 mg | ORAL_CAPSULE | Freq: Three times a day (TID) | ORAL | 0 refills | Status: DC

## 2018-05-20 MED ORDER — ACETAMINOPHEN 325 MG PO TABS
650.0000 mg | ORAL_TABLET | Freq: Once | ORAL | Status: AC
  Administered 2018-05-20: 650 mg via ORAL
  Filled 2018-05-20: qty 2

## 2018-05-20 MED ORDER — IPRATROPIUM-ALBUTEROL 0.5-2.5 (3) MG/3ML IN SOLN
3.0000 mL | Freq: Once | RESPIRATORY_TRACT | Status: AC
  Administered 2018-05-20: 3 mL via RESPIRATORY_TRACT
  Filled 2018-05-20: qty 3

## 2018-05-20 MED ORDER — METHOCARBAMOL 500 MG PO TABS: 500.0000 mg | ORAL_TABLET | Freq: Two times a day (BID) | ORAL | 0 refills | Status: DC

## 2018-05-20 MED ORDER — PREDNISONE 10 MG (21) PO TBPK: ORAL_TABLET | ORAL | 0 refills | Status: DC

## 2018-05-20 MED ORDER — ALBUTEROL SULFATE HFA 108 (90 BASE) MCG/ACT IN AERS: 2.0000 | INHALATION_SPRAY | RESPIRATORY_TRACT | 0 refills | Status: DC | PRN

## 2018-05-20 MED ORDER — SODIUM CHLORIDE 0.9 % IV BOLUS
1000.0000 mL | Freq: Once | INTRAVENOUS | Status: AC
  Administered 2018-05-20: 1000 mL via INTRAVENOUS

## 2018-05-20 MED ORDER — METHYLPREDNISOLONE SODIUM SUCC 125 MG IJ SOLR
125.0000 mg | Freq: Once | INTRAMUSCULAR | Status: AC
  Administered 2018-05-20: 125 mg via INTRAVENOUS
  Filled 2018-05-20: qty 2

## 2018-05-20 NOTE — Discharge Instructions (Signed)
Your symptoms are likely consistent with a viral illness. Viruses do not require or respond to antibiotics. Treatment is symptomatic care and it is important to note that these symptoms may last for 7-14 days.   Hand washing: Wash your hands throughout the day, but especially before and after touching the face, using the restroom, sneezing, coughing, or touching surfaces that have been coughed or sneezed upon. Hydration: Symptoms will be intensified and complicated by dehydration. Dehydration can also extend the duration of symptoms. Drink plenty of fluids and get plenty of rest. You should be drinking at least half a liter of water an hour to stay hydrated. Electrolyte drinks (ex. Gatorade, Powerade, Pedialyte) are also encouraged. You should be drinking enough fluids to make your urine light yellow, almost clear. If this is not the case, you are not drinking enough water. Please note that some of the treatments indicated below will not be effective if you are not adequately hydrated. Diet: Please concentrate on hydration, however, you may introduce food slowly.  Start with a clear liquid diet, progressed to a full liquid diet, and then bland solids as you are able. Pain or fever: Acetaminophen (generic for Tylenol) for pain or fever.  Nausea/vomiting: Use the ondansetron (generic for Zofran) for nausea or vomiting.  Cough: Use the benzonatate (generic for Tessalon) for cough.  Albuterol: May use the albuterol as needed for instances of shortness of breath. Prednisone: Take the prednisone, as directed, in its entirety. Zyrtec or Claritin: May add these medication daily to control underlying symptoms of congestion, sneezing, and other signs of allergies.  These medications are available over-the-counter. Generics: Cetirizine (generic for Zyrtec) and loratadine (generic for Claritin). Fluticasone: Use fluticasone (generic for Flonase), as directed, for nasal and sinus congestion.  This medication is  available over-the-counter. Congestion: Plain guaifenesin (generic for plain Mucinex) may help relieve congestion. Saline sinus rinses and saline nasal sprays may also help relieve congestion.  Follow up: Follow up with a primary care provider within the next two weeks should symptoms fail to resolve. Return: Return to the ED for significantly worsening symptoms, shortness of breath, persistent vomiting, large amounts of blood in stool, or any other major concerns.  For prescription assistance, may try using prescription discount sites or apps, such as goodrx.com  For the pain in your shoulder, may take Tylenol for the pain. Robaxin: Robaxin is a muscle relaxer and can help relieve stiff muscles or muscle spasms.  Do not drive or perform other dangerous activities while taking the Robaxin. Exercises: Try performing the exercises about 3 times a week and work up to performing them daily.  May follow-up with an orthopedic specialist on this matter.

## 2018-05-20 NOTE — ED Provider Notes (Signed)
[facility] EMERGENCY DEPARTMENT Provider Note   CSN: [id number] Arrival date & time: 05/20/18  0409     History   Chief Complaint Chief Complaint  Patient presents with  . Chest Pain    HPI [name] is a 52 y.o. female.  HPI   [name] is a 52 y.o. female, with a history of anemia, hyperlipidemia, sarcoidosis, and NSTEMI, presenting to the ED with right chest and right shoulder pain noted upon waking around 3 AM this morning. States, "I got up to go to work and I felt hot and this pain in my chest so I decided to come here instead of going to work.  It feels like my neck and my chest are tight, like a pulling."  Pain is constant, 7/10, worse with movement of the right shoulder.  She has not had this pain before. Accompanied by tingling in the right arm. Also complains of nonproductive cough, nasal congestion, and rhinorrhea for the last 4 days.  She took NyQuil last night for the symptoms. Denies history of PE/DVT, recent surgery, recent trauma, hormone use, recent travel/immobilization. Denies fever, diaphoresis, vomiting, diarrhea, abdominal pain, numbness, weakness, lower extremity edema/pain, or any other complaints.    Past Medical History:  Diagnosis Date  . Anemia   . Fibroid   . Hyperlipidemia   . NSTEMI (non-ST elevated myocardial infarction) ([place]) 06/2017   s/p DES to LCX 07/02/17  . Sarcoidosis     Patient Active Problem List   Diagnosis Date Noted  . Chest pain, rule out acute myocardial infarction 12/20/2017  . CAD (coronary artery disease) 12/20/2017  . Lung nodule, multiple 07/04/2017  . Post PTCA 07/04/2017  . Abnormal EKG   . Acute chest pain   . Femoral artery hematoma complicating cardiac catheterization 07/02/2017  . Hyperlipidemia with target LDL less than 70 07/02/2017  . Tobacco abuse 07/02/2017  . NSTEMI (non-ST elevated myocardial infarction) ([place])   . Knee LCL sprain 03/20/2017  . Rectal bleeding  10/18/2016  . Special screening for malignant neoplasms, colon 10/18/2016  . Vitamin D deficiency 04/16/2015    Past Surgical History:  Procedure Laterality Date  . ABDOMINAL HYSTERECTOMY    . CESAREAN SECTION  1987  . CORONARY/GRAFT ACUTE MI REVASCULARIZATION N/A 06/30/2017   Procedure: Coronary/Graft Acute MI Revascularization;  Surgeon: Lorretta Harp, MD;  Location: [facility];  Service: Cardiovascular;  Laterality: N/A;  . FOREARM FRACTURE SURGERY Right 1977  . FRACTURE SURGERY    . LEFT HEART CATH AND CORONARY ANGIOGRAPHY N/A 06/30/2017   Procedure: LEFT HEART CATH AND CORONARY ANGIOGRAPHY;  Surgeon: Lorretta Harp, MD;  Location: [facility];  Service: Cardiovascular;  Laterality: N/A;  . LEFT HEART CATH AND CORONARY ANGIOGRAPHY N/A 12/23/2017   Procedure: LEFT HEART CATH AND CORONARY ANGIOGRAPHY;  Surgeon: Lorretta Harp, MD;  Location: [facility];  Service: Cardiovascular;  Laterality: N/A;  . TUBAL LIGATION  1990     OB History   None      Home Medications    Prior to Admission medications   Medication Sig Start Date End Date Taking? Authorizing Provider  albuterol (PROVENTIL HFA;VENTOLIN HFA) 108 (90 Base) MCG/ACT inhaler Inhale 2 puffs into the lungs every 4 (four) hours as needed for wheezing or shortness of breath. 05/20/18   Ara Grandmaison, Helane Gunther, PA-C  aspirin EC 81 MG tablet Take 81 mg by mouth daily.    [provider]  atorvastatin (  LIPITOR) 80 MG tablet Take 1 tablet (80 mg total) by mouth daily. Patient taking differently: Take 80 mg by mouth at bedtime.  12/02/17 03/26/26  Shawnee Knapp, MD  benzonatate (TESSALON) 100 MG capsule Take 1 capsule (100 mg total) by mouth every 8 (eight) hours. 05/20/18   Satrina Magallanes C, PA-C  cephALEXin (KEFLEX) 500 MG capsule Take 1 capsule (500 mg total) by mouth 2 (two) times daily. 04/28/18   Shawnee Knapp, MD  hydrocortisone (ANUSOL-HC) 2.5 % rectal cream Place 1 application rectally 2 (two) times  daily. 04/28/18   Shawnee Knapp, MD  lisinopril (PRINIVIL,ZESTRIL) 2.5 MG tablet Take 1 tablet (2.5 mg total) by mouth daily. 12/02/17   Shawnee Knapp, MD  methocarbamol (ROBAXIN) 500 MG tablet Take 1 tablet (500 mg total) by mouth 2 (two) times daily. 05/20/18   Vestal Markin C, PA-C  metoprolol tartrate (LOPRESSOR) 25 MG tablet Take 0.5 tablets (12.5 mg total) by mouth 2 (two) times daily. 12/02/17   Shawnee Knapp, MD  naproxen (NAPROSYN) 500 MG tablet Take 1 tablet (500 mg total) by mouth 2 (two) times daily with a meal. 03/26/18   Larene Pickett, PA-C  nitroGLYCERIN (NITROSTAT) 0.4 MG SL tablet Place 1 tablet (0.4 mg total) under the tongue every 5 (five) minutes x 3 doses as needed for chest pain. 07/04/17   Kathyrn Drown D, NP  oxyCODONE-acetaminophen (PERCOCET) 5-325 MG tablet Take 1 tablet by mouth every 4 (four) hours as needed. 03/26/18   Larene Pickett, PA-C  predniSONE (STERAPRED UNI-PAK 21 TAB) 10 MG (21) TBPK tablet Take 6 tabs (60mg ) day 1, 5 tabs (50mg ) day 2, 4 tabs (40mg ) day 3, 3 tabs (30mg ) day 4, 2 tabs (20mg ) day 5, and 1 tab (10mg ) day 6. 05/20/18   Azaylia Fong C, PA-C  ticagrelor (BRILINTA) 90 MG TABS tablet Take 1 tablet (90 mg total) by mouth 2 (two) times daily. 07/04/17   Tommie Raymond, NP    Family History Family History  Problem Relation Age of Onset  . Diabetes Mother   . Stroke Mother 47  . Hypertension Mother   . Hyperlipidemia Father   . Stroke Father 35  . Hypertension Father   . Breast cancer Paternal Grandmother     Social History Social History   Tobacco Use  . Smoking status: Current Every Day Smoker    Packs/day: 1.00    Years: 29.00    Pack years: 29.00    Types: Cigarettes  . Smokeless tobacco: Never Used  Substance Use Topics  . Alcohol use: Yes    Comment: 12/20/2017 "couple drinks once/yr"  . Drug use: Never     Allergies   Hydrocodone   Review of Systems Review of Systems  Constitutional: Negative for chills, diaphoresis and fever.    HENT: Positive for congestion and rhinorrhea. Negative for sore throat, trouble swallowing and voice change.   Respiratory: Positive for cough and shortness of breath.   Cardiovascular: Positive for chest pain. Negative for leg swelling.  Gastrointestinal: Negative for abdominal pain, diarrhea, nausea and vomiting.  Genitourinary: Negative for dysuria and hematuria.  Musculoskeletal: Positive for arthralgias and neck pain.  Neurological: Negative for dizziness, syncope, weakness, numbness and headaches.  All other systems reviewed and are negative.    Physical Exam Updated Vital Signs BP 110/82 (BP Location: Right Arm)   Pulse 94   Temp 97.9 F (36.6 C) (Oral)   Resp 19   SpO2 100%  Physical Exam  Constitutional: She is oriented to person, place, and time. She appears well-developed and well-nourished. No distress.  HENT:  Head: Normocephalic and atraumatic.  Eyes: Pupils are equal, round, and reactive to light. Conjunctivae and EOM are normal.  Neck: Normal range of motion and full passive range of motion without pain. Neck supple.  Cardiovascular: Normal rate, regular rhythm, normal heart sounds and intact distal pulses.  Pulses:      Radial pulses are 2+ on the right side, and 2+ on the left side.  Pulmonary/Chest: Effort normal and breath sounds normal. No respiratory distress.    Abdominal: Soft. There is no tenderness. There is no guarding.  Musculoskeletal: She exhibits no edema.  Pain with range of motion of the right shoulder, but without swelling, deformity, crepitus, erythema, increased warmth, or instability.  Lymphadenopathy:    She has no cervical adenopathy.  Neurological: She is alert and oriented to person, place, and time.  Sensation grossly intact to light touch through each of the nerve distributions of the bilateral upper extremities. Abduction and adduction of the fingers intact against resistance. Grip strength equal bilaterally. Supination and  pronation intact against resistance. Strength 5/5 through the cardinal directions of the bilateral wrists. Strength 5/5 with flexion and extension of the bilateral elbows. Patient can touch the thumb to each one of the fingertips without difficulty.   Skin: Skin is warm and dry. Capillary refill takes less than 2 seconds. She is not diaphoretic.  Psychiatric: She has a normal mood and affect. Her behavior is normal.  Nursing note and vitals reviewed.    ED Treatments / Results  Labs (all labs ordered are listed, but only abnormal results are displayed) Labs Reviewed  BASIC METABOLIC PANEL - Abnormal; Notable for the following components:      Result Value   Glucose, Bld 135 (*)    Calcium 10.5 (*)    All other components within normal limits  CBC  I-STAT TROPONIN, ED  I-STAT BETA HCG BLOOD, ED ([facility], [facility], [facility] ONLY)  I-STAT TROPONIN, ED    EKG EKG Interpretation  Date/Time:  Tuesday May 20 2018 04:17:34 EST Ventricular Rate:  92 PR Interval:  162 QRS Duration: 74 QT Interval:  350 QTC Calculation: 432 R Axis:   43 Text Interpretation:  Normal sinus rhythm Right atrial enlargement Cannot rule out Anterior infarct , age undetermined Abnormal ECG Nonspecific T wave abnormality No significant change since last tracing Confirmed by Pryor Curia [phone]) on 05/20/2018 5:07:14 AM   Radiology Dg Chest 2 View  Result Date: 05/20/2018 CLINICAL DATA:  Initial evaluation for acute chest pain, shortness of breath, cough. EXAM: CHEST - 2 VIEW COMPARISON:  Prior radiograph from 03/25/2018 FINDINGS: Cardiac and mediastinal silhouettes are within normal limits. Lungs normally inflated. Mild scattered bronchitic changes. Slightly more focal area of streaky density within the peripheral right upper lobe is stable from previous, likely chronic. No consolidative opacity identified. No edema or effusion. No pneumothorax. No acute osseous abnormality. IMPRESSION: 1. Mild scattered diffuse  peribronchial thickening, which could reflect underlying bronchiolitis. No consolidative opacity to suggest pneumonia. 2. No other active cardiopulmonary disease. Electronically Signed   By: Jeannine Boga M.D.   On: 05/20/2018 04:50    Procedures Procedures (including critical care time)  Heart cath performed July 2019:  Previously placed Prox Cx to Mid Cx stent (unknown type) is widely patent.  Prox LAD lesion is 25% stenosed.  There is mild left ventricular systolic dysfunction.  The left ventricular  ejection fraction is 50-55% by visual estimate.   Medications Ordered in ED Medications  sodium chloride 0.9 % bolus 1,000 mL (has no administration in time range)  methylPREDNISolone sodium succinate (SOLU-MEDROL) 125 mg/2 mL injection 125 mg (has no administration in time range)  ipratropium-albuterol (DUONEB) 0.5-2.5 (3) MG/3ML nebulizer solution 3 mL (3 mLs Nebulization Given 05/20/18 0815)  acetaminophen (TYLENOL) tablet 650 mg (650 mg Oral Given 05/20/18 0815)     Initial Impression / Assessment and Plan / ED Course  I have reviewed the triage vital signs and the nursing notes.  Pertinent labs & imaging results that were available during my care of the patient were reviewed by me and considered in my medical decision making (see chart for details).     Patient presents with chest pain and right shoulder pain.  This pain is reproducible on exam with no focal neuro deficits.  Low suspicion for ACS. No high risk/suspicious features; no exertional chest pain, vomiting, diaphoresis, or radiation. Wells criteria score is 0, indicating low risk for PE. Patient is nontoxic appearing, afebrile, not tachycardic, not tachypneic, not hypotensive, maintains excellent SPO2 on room air, and is in no apparent distress.  Delta troponins negative. No acute EKG changes.  Borderline orthostatic changes in patient's pulse, dehydration suspected. Symptoms resolved during ED course. The  patient was given instructions for home care as well as return precautions. Patient voices understanding of these instructions, accepts the plan, and is comfortable with discharge.   Vitals:   05/20/18 0414 05/20/18 0655 05/20/18 1006  BP: 110/82 113/72 123/83  Pulse: 94 77 81  Resp: 19 17 16   Temp: 97.9 F (36.6 C) 97.6 F (36.4 C)   TempSrc: Oral Oral   SpO2: 100% 96% 100%   Orthostatic VS for the past 24 hrs:  BP- Lying Pulse- Lying BP- Sitting Pulse- Sitting BP- Standing at 0 minutes Pulse- Standing at 0 minutes  05/20/18 0654 113/72 78 123/69 74 110/71 94     Final Clinical Impressions(s) / ED Diagnoses   Final diagnoses:  Nonspecific chest pain    ED Discharge Orders         Ordered    predniSONE (STERAPRED UNI-PAK 21 TAB) 10 MG (21) TBPK tablet     05/20/18 0932    albuterol (PROVENTIL HFA;VENTOLIN HFA) 108 (90 Base) MCG/ACT inhaler  Every 4 hours PRN     05/20/18 0932    benzonatate (TESSALON) 100 MG capsule  Every 8 hours     05/20/18 0932    methocarbamol (ROBAXIN) 500 MG tablet  2 times daily     05/20/18 0932           Lorayne Bender, PA-C 05/20/18 1019    Sherwood Gambler, MD 05/20/18 1520

## 2018-05-20 NOTE — ED Triage Notes (Signed)
Patient reports she got up this am to go to work and began feeling "achy, clammy and having chest pain and sob." pt also endorses chills and coughing that began this am as well. Pt a/ox4, resp e/u, nad

## 2018-06-17 ENCOUNTER — Encounter: Payer: Self-pay | Admitting: Emergency Medicine

## 2018-06-17 ENCOUNTER — Other Ambulatory Visit: Payer: Self-pay

## 2018-06-17 ENCOUNTER — Ambulatory Visit (INDEPENDENT_AMBULATORY_CARE_PROVIDER_SITE_OTHER): Payer: Commercial Managed Care - PPO | Admitting: Emergency Medicine

## 2018-06-17 VITALS — BP 117/72 | HR 89 | Temp 98.1°F | Resp 20 | Ht 65.24 in | Wt 174.6 lb

## 2018-06-17 DIAGNOSIS — J22 Unspecified acute lower respiratory infection: Secondary | ICD-10-CM | POA: Diagnosis not present

## 2018-06-17 DIAGNOSIS — R05 Cough: Secondary | ICD-10-CM

## 2018-06-17 DIAGNOSIS — R6889 Other general symptoms and signs: Secondary | ICD-10-CM | POA: Insufficient documentation

## 2018-06-17 DIAGNOSIS — R059 Cough, unspecified: Secondary | ICD-10-CM | POA: Insufficient documentation

## 2018-06-17 LAB — POC INFLUENZA A&B (BINAX/QUICKVUE)
INFLUENZA A, POC: NEGATIVE
Influenza B, POC: NEGATIVE

## 2018-06-17 MED ORDER — PROMETHAZINE-DM 6.25-15 MG/5ML PO SYRP: 5.0000 mL | ORAL_SOLUTION | Freq: Four times a day (QID) | ORAL | 0 refills | Status: DC | PRN

## 2018-06-17 MED ORDER — AZITHROMYCIN 250 MG PO TABS: ORAL_TABLET | ORAL | 0 refills | Status: DC

## 2018-06-17 MED ORDER — BENZONATATE 200 MG PO CAPS: 200.0000 mg | ORAL_CAPSULE | Freq: Two times a day (BID) | ORAL | 0 refills | Status: DC | PRN

## 2018-06-17 NOTE — Patient Instructions (Addendum)
     If you have lab work done today you will be contacted with your lab results within the next 2 weeks.  If you have not heard from us then please contact us. The fastest way to get your results is to register for My Chart.   IF you received an x-ray today, you will receive an invoice from [facility]. Please contact [facility] at [phone] with questions or concerns regarding your invoice.   IF you received labwork today, you will receive an invoice from [facility]. Please contact [facility] at [phone] with questions or concerns regarding your invoice.   Our billing staff will not be able to assist you with questions regarding bills from these companies.  You will be contacted with the lab results as soon as they are available. The fastest way to get your results is to activate your My Chart account. Instructions are located on the last page of this paperwork. If you have not heard from us regarding the results in 2 weeks, please contact this office.     Cough, Adult  A cough helps to clear your throat and lungs. A cough may last only 2-3 weeks (acute), or it may last longer than 8 weeks (chronic). Many different things can cause a cough. A cough may be a sign of an illness or another medical condition. Follow these instructions at home:  Pay attention to any changes in your cough.  Take medicines only as told by your doctor. ? If you were prescribed an antibiotic medicine, take it as told by your doctor. Do not stop taking it even if you start to feel better. ? Talk with your doctor before you try using a cough medicine.  Drink enough fluid to keep your pee (urine) clear or pale yellow.  If the air is dry, use a cold steam vaporizer or humidifier in your home.  Stay away from things that make you cough at work or at home.  If your cough is worse at night, try using extra pillows to raise your head up higher while you sleep.  Do not smoke, and try not to  be around smoke. If you need help quitting, ask your doctor.  Do not have caffeine.  Do not drink alcohol.  Rest as needed. Contact a doctor if:  You have new problems (symptoms).  You cough up yellow fluid (pus).  Your cough does not get better after 2-3 weeks, or your cough gets worse.  Medicine does not help your cough and you are not sleeping well.  You have pain that gets worse or pain that is not helped with medicine.  You have a fever.  You are losing weight and you do not know why.  You have night sweats. Get help right away if:  You cough up blood.  You have trouble breathing.  Your heartbeat is very fast. This information is not intended to replace advice given to you by your health care provider. Make sure you discuss any questions you have with your health care provider. Document Released: 02/08/2011 Document Revised: 11/03/2015 Document Reviewed: 08/04/2014 Elsevier Interactive Patient Education  2019 [employer]  

## 2018-06-17 NOTE — Progress Notes (Signed)
[name] 53 y.o.   Chief Complaint  Patient presents with  . Cough    X 4 days  . Generalized Body Aches    X 4 days- with headache    HISTORY OF PRESENT ILLNESS: This is a 53 y.o. female complaining of cough, chills, generalized body aches, pleuritic chest pain, that started 4 days ago.  Feels worse than a cold.  No other significant symptoms.  HPI   Prior to Admission medications   Medication Sig Start Date End Date Taking? Authorizing Provider  albuterol (PROVENTIL HFA;VENTOLIN HFA) 108 (90 Base) MCG/ACT inhaler Inhale 2 puffs into the lungs every 4 (four) hours as needed for wheezing or shortness of breath. 05/20/18  Yes Joy, Shawn C, PA-C  aspirin EC 81 MG tablet Take 81 mg by mouth daily.   Yes [provider]  atorvastatin (LIPITOR) 80 MG tablet Take 1 tablet (80 mg total) by mouth daily. Patient taking differently: Take 80 mg by mouth at bedtime.  12/02/17 03/26/26 Yes Shawnee Knapp, MD  benzonatate (TESSALON) 100 MG capsule Take 1 capsule (100 mg total) by mouth every 8 (eight) hours. 05/20/18  Yes Joy, Shawn C, PA-C  cephALEXin (KEFLEX) 500 MG capsule Take 1 capsule (500 mg total) by mouth 2 (two) times daily. 04/28/18  Yes Shawnee Knapp, MD  lisinopril (PRINIVIL,ZESTRIL) 2.5 MG tablet Take 1 tablet (2.5 mg total) by mouth daily. 12/02/17  Yes Shawnee Knapp, MD  methocarbamol (ROBAXIN) 500 MG tablet Take 1 tablet (500 mg total) by mouth 2 (two) times daily. 05/20/18  Yes Joy, Shawn C, PA-C  metoprolol tartrate (LOPRESSOR) 25 MG tablet Take 0.5 tablets (12.5 mg total) by mouth 2 (two) times daily. 12/02/17  Yes Shawnee Knapp, MD  nitroGLYCERIN (NITROSTAT) 0.4 MG SL tablet Place 1 tablet (0.4 mg total) under the tongue every 5 (five) minutes x 3 doses as needed for chest pain. 07/04/17  Yes Kathyrn Drown D, NP  ticagrelor (BRILINTA) 90 MG TABS tablet Take 1 tablet (90 mg total) by mouth 2 (two) times daily. 07/04/17  Yes Kathyrn Drown D, NP  hydrocortisone (ANUSOL-HC) 2.5  % rectal cream Place 1 application rectally 2 (two) times daily. Patient not taking: Reported on 06/17/2018 04/28/18   Shawnee Knapp, MD  naproxen (NAPROSYN) 500 MG tablet Take 1 tablet (500 mg total) by mouth 2 (two) times daily with a meal. Patient not taking: Reported on 06/17/2018 03/26/18   Larene Pickett, PA-C  oxyCODONE-acetaminophen (PERCOCET) 5-325 MG tablet Take 1 tablet by mouth every 4 (four) hours as needed. Patient not taking: Reported on 06/17/2018 03/26/18   Larene Pickett, PA-C  predniSONE (STERAPRED UNI-PAK 21 TAB) 10 MG (21) TBPK tablet Take 6 tabs (60mg ) day 1, 5 tabs (50mg ) day 2, 4 tabs (40mg ) day 3, 3 tabs (30mg ) day 4, 2 tabs (20mg ) day 5, and 1 tab (10mg ) day 6. Patient not taking: Reported on 06/17/2018 05/20/18   Lorayne Bender, PA-C    Allergies  Allergen Reactions  . Hydrocodone Hives and Itching    Patient Active Problem List   Diagnosis Date Noted  . Chest pain, rule out acute myocardial infarction 12/20/2017  . CAD (coronary artery disease) 12/20/2017  . Lung nodule, multiple 07/04/2017  . Post PTCA 07/04/2017  . Abnormal EKG   . Acute chest pain   . Femoral artery hematoma complicating cardiac catheterization 07/02/2017  . Hyperlipidemia with target LDL less than 70 07/02/2017  . Tobacco abuse 07/02/2017  .  NSTEMI (non-ST elevated myocardial infarction) ([facility])   . Knee LCL sprain 03/20/2017  . Rectal bleeding 10/18/2016  . Special screening for malignant neoplasms, colon 10/18/2016  . Vitamin D deficiency 04/16/2015    Past Medical History:  Diagnosis Date  . Anemia   . Fibroid   . Hyperlipidemia   . NSTEMI (non-ST elevated myocardial infarction) ([facility]) 06/2017   s/p DES to LCX 07/02/17  . Sarcoidosis     Past Surgical History:  Procedure Laterality Date  . ABDOMINAL HYSTERECTOMY    . CESAREAN SECTION  1987  . CORONARY/GRAFT ACUTE MI REVASCULARIZATION N/A 06/30/2017   Procedure: Coronary/Graft Acute MI Revascularization;  Surgeon: Lorretta Harp,  MD;  Location: [facility];  Service: Cardiovascular;  Laterality: N/A;  . FOREARM FRACTURE SURGERY Right 1977  . FRACTURE SURGERY    . LEFT HEART CATH AND CORONARY ANGIOGRAPHY N/A 06/30/2017   Procedure: LEFT HEART CATH AND CORONARY ANGIOGRAPHY;  Surgeon: Lorretta Harp, MD;  Location: [facility];  Service: Cardiovascular;  Laterality: N/A;  . LEFT HEART CATH AND CORONARY ANGIOGRAPHY N/A 12/23/2017   Procedure: LEFT HEART CATH AND CORONARY ANGIOGRAPHY;  Surgeon: Lorretta Harp, MD;  Location: [facility];  Service: Cardiovascular;  Laterality: N/A;  . TUBAL LIGATION  1990    Social History   Socioeconomic History  . Marital status: Widowed    Spouse name: Not on file  . Number of children: 2  . Years of education: Not on file  . Highest education level: Not on file  Occupational History  . Occupation: Chiropodist: [employer]  . Financial resource strain: Not on file  . Food insecurity:    Worry: Not on file    Inability: Not on file  . Transportation needs:    Medical: Not on file    Non-medical: Not on file  Tobacco Use  . Smoking status: Former Smoker    Packs/day: 1.00    Years: 29.00    Pack years: 29.00    Types: Cigarettes    Last attempt to quit: 03/19/2018    Years since quitting: 0.2  . Smokeless tobacco: Never Used  Substance and Sexual Activity  . Alcohol use: Yes    Comment: 12/20/2017 "couple drinks once/yr"  . Drug use: Never  . Sexual activity: Not on file  Lifestyle  . Physical activity:    Days per week: Not on file    Minutes per session: Not on file  . Stress: Not on file  Relationships  . Social connections:    Talks on phone: Not on file    Gets together: Not on file    Attends religious service: Not on file    Active member of club or organization: Not on file    Attends meetings of clubs or organizations: Not on file    Relationship status: Not on file  . Intimate  partner violence:    Fear of current or ex partner: Not on file    Emotionally abused: Not on file    Physically abused: Not on file    Forced sexual activity: Not on file  Other Topics Concern  . Not on file  Social History Narrative  . Not on file    Family History  Problem Relation Age of Onset  . Diabetes Mother   . Stroke Mother 28  . Hypertension Mother   . Hyperlipidemia Father   . Stroke  Father 58  . Hypertension Father   . Breast cancer Paternal Grandmother      Review of Systems  Constitutional: Positive for chills, fever and malaise/fatigue.  HENT: Positive for congestion. Negative for ear pain and sore throat.   Eyes: Negative.  Negative for discharge and redness.  Respiratory: Positive for cough and sputum production. Negative for hemoptysis, shortness of breath and wheezing.   Cardiovascular: Negative.  Negative for chest pain and palpitations.  Gastrointestinal: Negative.  Negative for abdominal pain, diarrhea, nausea and vomiting.  Genitourinary: Negative.  Negative for dysuria.  Musculoskeletal: Positive for myalgias. Negative for back pain, joint pain and neck pain.  Skin: Negative.  Negative for rash.  Neurological: Negative.  Negative for dizziness and headaches.  Endo/Heme/Allergies: Negative.   All other systems reviewed and are negative.   Vitals:   06/17/18 1621  BP: 117/72  Pulse: 89  Resp: 20  Temp: 98.1 F (36.7 C)  SpO2: 100%    Physical Exam Vitals signs reviewed.  Constitutional:      Appearance: Normal appearance.  HENT:     Head: Normocephalic and atraumatic.     Nose: Nose normal.     Mouth/Throat:     Mouth: Mucous membranes are moist.     Pharynx: Oropharynx is clear.  Eyes:     Extraocular Movements: Extraocular movements intact.     Conjunctiva/sclera: Conjunctivae normal.     Pupils: Pupils are equal, round, and reactive to light.  Neck:     Musculoskeletal: Normal range of motion.  Cardiovascular:     Rate and  Rhythm: Normal rate and regular rhythm.     Heart sounds: Normal heart sounds.  Pulmonary:     Effort: Pulmonary effort is normal.     Breath sounds: Normal breath sounds.  Abdominal:     Palpations: Abdomen is soft.     Tenderness: There is no abdominal tenderness.  Musculoskeletal: Normal range of motion.  Lymphadenopathy:     Cervical: No cervical adenopathy.  Skin:    General: Skin is warm and dry.     Capillary Refill: Capillary refill takes less than 2 seconds.  Neurological:     General: No focal deficit present.     Mental Status: She is alert and oriented to person, place, and time.  Psychiatric:        Mood and Affect: Mood normal.        Behavior: Behavior normal.    Results for orders placed or performed in visit on 06/17/18 (from the past 24 hour(s))  POC Influenza A&B(BINAX/QUICKVUE)     Status: None   Collection Time: 06/17/18  5:08 PM  Result Value Ref Range   Influenza A, POC Negative Negative   Influenza B, POC Negative Negative   A total of 25 minutes was spent in the room with the patient, greater than 50% of which was in counseling/coordination of care regarding differential diagnosis, treatment, medications, prognosis and need for follow-up if no better or worse.   ASSESSMENT & PLAN: Kenadie was seen today for cough and generalized body aches.  Diagnoses and all orders for this visit:  Flu-like symptoms -     POC Influenza A&B(BINAX/QUICKVUE)  Cough -     benzonatate (TESSALON) 200 MG capsule; Take 1 capsule (200 mg total) by mouth 2 (two) times daily as needed for cough. -     promethazine-dextromethorphan (PROMETHAZINE-DM) 6.25-15 MG/5ML syrup; Take 5 mLs by mouth 4 (four) times daily as needed for cough.  Lower  respiratory infection -     azithromycin (ZITHROMAX) 250 MG tablet; Sig as indicated     Patient Instructions       If you have lab work done today you will be contacted with your lab results within the next 2 weeks.  If you have  not heard from [place] then please contact us. The fastest way to get your results is to register for My Chart.   IF you received an x-ray today, you will receive an invoice from [facility]. Please contact [facility] at [phone] with questions or concerns regarding your invoice.   IF you received labwork today, you will receive an invoice from [facility]. Please contact [facility] at [phone] with questions or concerns regarding your invoice.   Our billing staff will not be able to assist you with questions regarding bills from these companies.  You will be contacted with the lab results as soon as they are available. The fastest way to get your results is to activate your My Chart account. Instructions are located on the last page of this paperwork. If you have not heard from [place] regarding the results in 2 weeks, please contact this office.     Cough, Adult  A cough helps to clear your throat and lungs. A cough may last only 2-3 weeks (acute), or it may last longer than 8 weeks (chronic). Many different things can cause a cough. A cough may be a sign of an illness or another medical condition. Follow these instructions at home:  Pay attention to any changes in your cough.  Take medicines only as told by your doctor. ? If you were prescribed an antibiotic medicine, take it as told by your doctor. Do not stop taking it even if you start to feel better. ? Talk with your doctor before you try using a cough medicine.  Drink enough fluid to keep your pee (urine) clear or pale yellow.  If the air is dry, use a cold steam vaporizer or humidifier in your home.  Stay away from things that make you cough at work or at home.  If your cough is worse at night, try using extra pillows to raise your head up higher while you sleep.  Do not smoke, and try not to be around smoke. If you need help quitting, ask your doctor.  Do not have caffeine.  Do not drink alcohol.  Rest as  needed. Contact a doctor if:  You have new problems (symptoms).  You cough up yellow fluid (pus).  Your cough does not get better after 2-3 weeks, or your cough gets worse.  Medicine does not help your cough and you are not sleeping well.  You have pain that gets worse or pain that is not helped with medicine.  You have a fever.  You are losing weight and you do not know why.  You have night sweats. Get help right away if:  You cough up blood.  You have trouble breathing.  Your heartbeat is very fast. This information is not intended to replace advice given to you by your health care provider. Make sure you discuss any questions you have with your health care provider. Document Released: 02/08/2011 Document Revised: 11/03/2015 Document Reviewed: 08/04/2014 Elsevier Interactive Patient Education  2019 Elsevier Inc.    Agustina Caroli, MD [facility]

## 2018-07-04 ENCOUNTER — Telehealth: Payer: Self-pay | Admitting: Family Medicine

## 2018-07-04 NOTE — Telephone Encounter (Signed)
LVM for pt to call the office and reschedule their appt that was scheduled for 07/21/18 with Dr. Brigitte Pulse. Due to Dr. Brigitte Pulse being out on leave, we will need to either reschedule with a different provider or if it can wait, she may schedule in March with Dr. Brigitte Pulse. When pt calls back, please reschedule with whichever pt would like to do. Thank you!

## 2018-07-15 ENCOUNTER — Other Ambulatory Visit (HOSPITAL_COMMUNITY): Payer: Self-pay | Admitting: Cardiology

## 2018-07-21 ENCOUNTER — Ambulatory Visit: Payer: Commercial Managed Care - PPO | Admitting: Family Medicine

## 2018-07-22 ENCOUNTER — Other Ambulatory Visit: Payer: Self-pay

## 2018-07-22 ENCOUNTER — Ambulatory Visit (INDEPENDENT_AMBULATORY_CARE_PROVIDER_SITE_OTHER): Payer: Commercial Managed Care - PPO | Admitting: Family Medicine

## 2018-07-22 ENCOUNTER — Encounter: Payer: Self-pay | Admitting: Family Medicine

## 2018-07-22 ENCOUNTER — Ambulatory Visit (INDEPENDENT_AMBULATORY_CARE_PROVIDER_SITE_OTHER): Payer: Commercial Managed Care - PPO

## 2018-07-22 VITALS — BP 116/83 | HR 81 | Temp 98.7°F | Resp 14 | Ht 64.0 in | Wt 175.0 lb

## 2018-07-22 DIAGNOSIS — R112 Nausea with vomiting, unspecified: Secondary | ICD-10-CM

## 2018-07-22 DIAGNOSIS — R05 Cough: Secondary | ICD-10-CM

## 2018-07-22 DIAGNOSIS — R1030 Lower abdominal pain, unspecified: Secondary | ICD-10-CM | POA: Diagnosis not present

## 2018-07-22 DIAGNOSIS — R5383 Other fatigue: Secondary | ICD-10-CM

## 2018-07-22 DIAGNOSIS — R059 Cough, unspecified: Secondary | ICD-10-CM

## 2018-07-22 DIAGNOSIS — N390 Urinary tract infection, site not specified: Secondary | ICD-10-CM | POA: Diagnosis not present

## 2018-07-22 LAB — POCT URINALYSIS DIP (MANUAL ENTRY)
BILIRUBIN UA: NEGATIVE
Blood, UA: NEGATIVE
Glucose, UA: NEGATIVE mg/dL
Ketones, POC UA: NEGATIVE mg/dL
Nitrite, UA: POSITIVE — AB
Protein Ur, POC: NEGATIVE mg/dL
Spec Grav, UA: 1.01 (ref 1.010–1.025)
Urobilinogen, UA: 1 E.U./dL
pH, UA: 5.5 (ref 5.0–8.0)

## 2018-07-22 LAB — POC INFLUENZA A&B (BINAX/QUICKVUE)
Influenza A, POC: NEGATIVE
Influenza B, POC: NEGATIVE

## 2018-07-22 LAB — POCT CBC
Granulocyte percent: 50.2 %G (ref 37–80)
HCT, POC: 43.1 % — AB (ref 29–41)
Hemoglobin: 14.1 g/dL (ref 11–14.6)
LYMPH, POC: 3.1 (ref 0.6–3.4)
MCH, POC: 29.3 pg (ref 27–31.2)
MCHC: 32.7 g/dL (ref 31.8–35.4)
MCV: 89.5 fL (ref 76–111)
MID (cbc): 0.6 (ref 0–0.9)
MPV: 7.9 fL (ref 0–99.8)
POC Granulocyte: 3.4 (ref 2–6.9)
POC LYMPH PERCENT: 46 %L (ref 10–50)
POC MID %: 3.8 % (ref 0–12)
Platelet Count, POC: 342 10*3/uL (ref 142–424)
RBC: 4.81 M/uL (ref 4.04–5.48)
RDW, POC: 13.9 %
WBC: 6.8 10*3/uL (ref 4.6–10.2)

## 2018-07-22 LAB — POC MICROSCOPIC URINALYSIS (UMFC): Mucus: ABSENT

## 2018-07-22 MED ORDER — BENZONATATE 100 MG PO CAPS: 100.0000 mg | ORAL_CAPSULE | Freq: Two times a day (BID) | ORAL | 0 refills | Status: DC | PRN

## 2018-07-22 MED ORDER — NITROFURANTOIN MONOHYD MACRO 100 MG PO CAPS: 100.0000 mg | ORAL_CAPSULE | Freq: Two times a day (BID) | ORAL | 0 refills | Status: DC

## 2018-07-22 MED ORDER — ONDANSETRON 4 MG PO TBDP: 4.0000 mg | ORAL_TABLET | Freq: Three times a day (TID) | ORAL | 0 refills | Status: DC | PRN

## 2018-07-22 NOTE — Progress Notes (Signed)
Subjective:    Patient ID: [name], female    DOB: [birth year], 53 y.o.   MRN: [id number]  HPI [name] is a 53 y.o. female Presents today for: Chief Complaint  Patient presents with  . flu likie symptoms    bodyaches, chills, vomiting, constipation, weakness and cough, green mucus since 07/17/18  . Abdominal Pain    strong urine odor x 2 days   Started 6 days ago with cough, congestion, body aches and vomiting 6 days ago. Green mucus as times. Initial day with more vomiting, none in past 3 days. Drinking water, juice.  Some soup. Less frequent urination, but some urgency since initial sx's 6 days ago. No hematuria, dysuria. Some lower abd pain, radiates to left side- about 6 days. Odor in urine, darker urine few days ago. Lightening up now. Drinking fluids ok now.  No measured fever. Feels generally weak. Worse past few days. Smoker, but has not smoked since been sick.   Tx: mucinex. Alka seltzer.   Drives city bus.  Did receive flu vaccine this year.    Patient Active Problem List   Diagnosis Date Noted  . Flu-like symptoms 06/17/2018  . Cough 06/17/2018  . Lower respiratory infection 06/17/2018  . Chest pain, rule out acute myocardial infarction 12/20/2017  . CAD (coronary artery disease) 12/20/2017  . Lung nodule, multiple 07/04/2017  . Post PTCA 07/04/2017  . Abnormal EKG   . Acute chest pain   . Femoral artery hematoma complicating cardiac catheterization 07/02/2017  . Hyperlipidemia with target LDL less than 70 07/02/2017  . Tobacco abuse 07/02/2017  . NSTEMI (non-ST elevated myocardial infarction) ([place])   . Knee LCL sprain 03/20/2017  . Rectal bleeding 10/18/2016  . Special screening for malignant neoplasms, colon 10/18/2016  . Vitamin D deficiency 04/16/2015   Past Medical History:  Diagnosis Date  . Anemia   . Fibroid   . Hyperlipidemia   . NSTEMI (non-ST elevated myocardial infarction) ([place]) 06/2017   s/p DES to LCX 07/02/17  .  Sarcoidosis    Past Surgical History:  Procedure Laterality Date  . ABDOMINAL HYSTERECTOMY    . CESAREAN SECTION  1987  . CORONARY/GRAFT ACUTE MI REVASCULARIZATION N/A 06/30/2017   Procedure: Coronary/Graft Acute MI Revascularization;  Surgeon: Lorretta Harp, MD;  Location: [facility];  Service: Cardiovascular;  Laterality: N/A;  . FOREARM FRACTURE SURGERY Right 1977  . FRACTURE SURGERY    . LEFT HEART CATH AND CORONARY ANGIOGRAPHY N/A 06/30/2017   Procedure: LEFT HEART CATH AND CORONARY ANGIOGRAPHY;  Surgeon: Lorretta Harp, MD;  Location: [facility];  Service: Cardiovascular;  Laterality: N/A;  . LEFT HEART CATH AND CORONARY ANGIOGRAPHY N/A 12/23/2017   Procedure: LEFT HEART CATH AND CORONARY ANGIOGRAPHY;  Surgeon: Lorretta Harp, MD;  Location: [facility];  Service: Cardiovascular;  Laterality: N/A;  . TUBAL LIGATION  1990   Allergies  Allergen Reactions  . Hydrocodone Hives and Itching   Prior to Admission medications   Medication Sig Start Date End Date Taking? Authorizing Provider  albuterol (PROVENTIL HFA;VENTOLIN HFA) 108 (90 Base) MCG/ACT inhaler Inhale 2 puffs into the lungs every 4 (four) hours as needed for wheezing or shortness of breath. 05/20/18  Yes Joy, Shawn C, PA-C  aspirin EC 81 MG tablet Take 81 mg by mouth daily.   Yes [provider]  atorvastatin (LIPITOR) 80 MG tablet Take 1 tablet (80 mg total) by mouth daily. Patient taking differently:  Take 80 mg by mouth at bedtime.  12/02/17 03/26/26 Yes Shawnee Knapp, MD  BRILINTA 90 MG TABS tablet TAKE 1 TABLET (90 MG TOTAL) BY MOUTH 2 (TWO) TIMES DAILY. 07/15/18  Yes Lorretta Harp, MD  hydrocortisone (ANUSOL-HC) 2.5 % rectal cream Place 1 application rectally 2 (two) times daily. 04/28/18  Yes Shawnee Knapp, MD  lisinopril (PRINIVIL,ZESTRIL) 2.5 MG tablet Take 1 tablet (2.5 mg total) by mouth daily. 12/02/17  Yes Shawnee Knapp, MD  metoprolol tartrate (LOPRESSOR) 25 MG tablet Take 0.5  tablets (12.5 mg total) by mouth 2 (two) times daily. 12/02/17  Yes Shawnee Knapp, MD  naproxen (NAPROSYN) 500 MG tablet Take 1 tablet (500 mg total) by mouth 2 (two) times daily with a meal. 03/26/18  Yes Larene Pickett, PA-C  nitroGLYCERIN (NITROSTAT) 0.4 MG SL tablet Place 1 tablet (0.4 mg total) under the tongue every 5 (five) minutes x 3 doses as needed for chest pain. 07/04/17  Yes Tommie Raymond, NP   Social History   Socioeconomic History  . Marital status: Widowed    Spouse name: Not on file  . Number of children: 2  . Years of education: Not on file  . Highest education level: Not on file  Occupational History  . Occupation: Chiropodist: [employer]  . Financial resource strain: Not on file  . Food insecurity:    Worry: Not on file    Inability: Not on file  . Transportation needs:    Medical: Not on file    Non-medical: Not on file  Tobacco Use  . Smoking status: Former Smoker    Packs/day: 1.00    Years: 29.00    Pack years: 29.00    Types: Cigarettes    Last attempt to quit: 03/19/2018    Years since quitting: 0.3  . Smokeless tobacco: Never Used  Substance and Sexual Activity  . Alcohol use: Yes    Comment: 12/20/2017 "couple drinks once/yr"  . Drug use: Never  . Sexual activity: Not on file  Lifestyle  . Physical activity:    Days per week: Not on file    Minutes per session: Not on file  . Stress: Not on file  Relationships  . Social connections:    Talks on phone: Not on file    Gets together: Not on file    Attends religious service: Not on file    Active member of club or organization: Not on file    Attends meetings of clubs or organizations: Not on file    Relationship status: Not on file  . Intimate partner violence:    Fear of current or ex partner: Not on file    Emotionally abused: Not on file    Physically abused: Not on file    Forced sexual activity: Not on file  Other Topics Concern  . Not  on file  Social History Narrative  . Not on file    Review of Systems     Objective:   Physical Exam Vitals signs reviewed.  Constitutional:      General: She is not in acute distress.    Appearance: She is well-developed.  HENT:     Head: Normocephalic and atraumatic.     Right Ear: Hearing, tympanic membrane, ear canal and external ear normal.     Left Ear: Hearing, tympanic membrane, ear canal and external ear normal.     Nose:  Nose normal.     Mouth/Throat:     Mouth: Mucous membranes are moist.     Pharynx: No oropharyngeal exudate.  Eyes:     Conjunctiva/sclera: Conjunctivae normal.     Pupils: Pupils are equal, round, and reactive to light.  Cardiovascular:     Rate and Rhythm: Normal rate and regular rhythm.     Heart sounds: Normal heart sounds. No murmur.  Pulmonary:     Effort: Pulmonary effort is normal. No respiratory distress.     Breath sounds: Normal breath sounds. No wheezing or rhonchi.     Comments: Overall clear.  Possible few coarse breath sounds right middle, normal effort, no distress. Abdominal:     General: Bowel sounds are normal.     Tenderness: There is abdominal tenderness in the suprapubic area. There is no right CVA tenderness or left CVA tenderness.  Skin:    General: Skin is warm and dry.     Findings: No rash.  Neurological:     Mental Status: She is alert and oriented to person, place, and time.  Psychiatric:        Behavior: Behavior normal.    Vitals:   07/22/18 1445  BP: 116/83  Pulse: 81  Resp: 14  Temp: 98.7 F (37.1 C)  TempSrc: Oral  SpO2: 100%  Weight: 175 lb (79.4 kg)  Height: _0  (1.626 m)      Results for orders placed or performed in visit on 07/22/18  POCT urinalysis dipstick  Result Value Ref Range   Color, UA yellow yellow   Clarity, UA cloudy (A) clear   Glucose, UA negative negative mg/dL   Bilirubin, UA negative negative   Ketones, POC UA negative negative mg/dL   Spec Grav, UA 1.010 1.010 -  1.025   Blood, UA negative negative   pH, UA 5.5 5.0 - 8.0   Protein Ur, POC negative negative mg/dL   Urobilinogen, UA 1.0 0.2 or 1.0 E.U./dL   Nitrite, UA Positive (A) Negative   Leukocytes, UA Small (1+) (A) Negative  POCT Microscopic Urinalysis ([facility])  Result Value Ref Range   WBC,UR,HPF,POC Few (A) None WBC/hpf   RBC,UR,HPF,POC None None RBC/hpf   Bacteria Many (A) None, Too numerous to count   Mucus Absent Absent   Epithelial Cells, UR Per Microscopy Few (A) None, Too numerous to count cells/hpf  POCT CBC  Result Value Ref Range   WBC 6.8 4.6 - 10.2 K/uL   Lymph, poc 3.1 0.6 - 3.4   POC LYMPH PERCENT 46.0 10 - 50 %L   MID (cbc) 0.6 0 - 0.9   POC MID % 3.8 0 - 12 %M   POC Granulocyte 3.4 2 - 6.9   Granulocyte percent 50.2 37 - 80 %G   RBC 4.81 4.04 - 5.48 M/uL   Hemoglobin 14.1 11 - 14.6 g/dL   HCT, POC 43.1 (A) 29 - 41 %   MCV 89.5 76 - 111 fL   MCH, POC 29.3 27 - 31.2 pg   MCHC 32.7 31.8 - 35.4 g/dL   RDW, POC 13.9 %   Platelet Count, POC 342 142 - 424 K/uL   MPV 7.9 0 - 99.8 fL  POC Influenza A&B(BINAX/QUICKVUE)  Result Value Ref Range   Influenza A, POC Negative Negative   Influenza B, POC Negative Negative   Dg Chest 2 View  Result Date: 07/22/2018 CLINICAL DATA:  Productive cough. EXAM: CHEST - 2 VIEW COMPARISON:  Radiographs 05/20/2018 and 03/25/2018. FINDINGS:  The heart size and mediastinal contours are stable. There is a coronary artery stent. There is improved aeration of the lungs with possible mild underlying chronic central airway thickening. No airspace disease, edema, pleural effusion or pneumothorax. No acute osseous findings are evident. IMPRESSION: No acute cardiopulmonary process. Possible mild chronic central airway thickening. Electronically Signed   By: Richardean Sale M.D.   On: 07/22/2018 16:09   Orthostatic VS for the past 24 hrs (Last 3 readings):  BP- Lying Pulse- Lying BP- Standing at 0 minutes Pulse- Standing at 0 minutes BP- Standing at 3  minutes Pulse- Standing at 3 minutes  07/22/18 1546 115/73 57 124/84 81 126/86 88       Assessment & Plan:    [name] is a 53 y.o. female Lower abdominal pain - Plan: POCT urinalysis dipstick, POCT Microscopic Urinalysis ([facility])  Fatigue, unspecified type - Plan: POC Influenza A&B(BINAX/QUICKVUE), Orthostatic vital signs, Basic metabolic panel  Cough - Plan: POC Influenza A&B(BINAX/QUICKVUE), DG Chest 2 View, benzonatate (TESSALON) 100 MG capsule  Urinary tract infection without hematuria, site unspecified - Plan: POCT CBC, Urine Culture, nitrofurantoin, macrocrystal-monohydrate, (MACROBID) 100 MG capsule  Nausea and vomiting, intractability of vomiting not specified, unspecified vomiting type - Plan: ondansetron (ZOFRAN ODT) 4 MG disintegrating tablet  Possible combination of viral illness with UTI.  Doubt true pyelonephritis based on exam.  -Zofran if needed for nausea/vomiting, oral rehydration therapy discussed.  Check BMP  -Start Macrobid for UTI, check culture  -Tessalon if needed for cough, other symptomatic care discussed with RTC precautions given.  Meds ordered this encounter  Medications  . ondansetron (ZOFRAN ODT) 4 MG disintegrating tablet    Sig: Take 1 tablet (4 mg total) by mouth every 8 (eight) hours as needed for nausea or vomiting.    Dispense:  10 tablet    Refill:  0  . benzonatate (TESSALON) 100 MG capsule    Sig: Take 1 capsule (100 mg total) by mouth 2 (two) times daily as needed for cough.    Dispense:  20 capsule    Refill:  0  . nitrofurantoin, macrocrystal-monohydrate, (MACROBID) 100 MG capsule    Sig: Take 1 capsule (100 mg total) by mouth 2 (two) times daily.    Dispense:  14 capsule    Refill:  0   Patient Instructions   Start antibiotic twice per day for urinary tract infection.  Blood counts were reassuring.    Chest x-ray did not show pneumonia. mucinex if needed for cough.  Tessalon Perles are also an option   I suspect you had  a viral illness which should be improving this week.  Small sips of fluids frequently to help with fatigue.  I will check some other electrolytes to make sure those look okay.  Zofran if needed for nausea or vomiting.  Thank you for coming in today.  Recheck next 3 to 4 days if not starting to improve, sooner if worse  Return to the clinic or go to the nearest emergency room if any of your symptoms worsen or new symptoms occur.   Urinary Tract Infection, Adult  A urinary tract infection (UTI) is an infection of any part of the urinary tract. The urinary tract includes the kidneys, ureters, bladder, and urethra. These organs make, store, and get rid of urine in the body. Your health care provider may use other names to describe the infection. An upper UTI affects the ureters and kidneys (pyelonephritis). A lower UTI affects the bladder (cystitis)  and urethra (urethritis). What are the causes? Most urinary tract infections are caused by bacteria in your genital area, around the entrance to your urinary tract (urethra). These bacteria grow and cause inflammation of your urinary tract. What increases the risk? You are more likely to develop this condition if:  You have a urinary catheter that stays in place (indwelling).  You are not able to control when you urinate or have a bowel movement (you have incontinence).  You are female and you: ? Use a spermicide or diaphragm for birth control. ? Have low estrogen levels. ? Are pregnant.  You have certain genes that increase your risk (genetics).  You are sexually active.  You take antibiotic medicines.  You have a condition that causes your flow of urine to slow down, such as: ? An enlarged prostate, if you are female. ? Blockage in your urethra (stricture). ? A kidney stone. ? A nerve condition that affects your bladder control (neurogenic bladder). ? Not getting enough to drink, or not urinating often.  You have certain medical  conditions, such as: ? Diabetes. ? A weak disease-fighting system (immunesystem). ? Sickle cell disease. ? Gout. ? Spinal cord injury. What are the signs or symptoms? Symptoms of this condition include:  Needing to urinate right away (urgently).  Frequent urination or passing small amounts of urine frequently.  Pain or burning with urination.  Blood in the urine.  Urine that smells bad or unusual.  Trouble urinating.  Cloudy urine.  Vaginal discharge, if you are female.  Pain in the abdomen or the lower back. You may also have:  Vomiting or a decreased appetite.  Confusion.  Irritability or tiredness.  A fever.  Diarrhea. The first symptom in older adults may be confusion. In some cases, they may not have any symptoms until the infection has worsened. How is this diagnosed? This condition is diagnosed based on your medical history and a physical exam. You may also have other tests, including:  Urine tests.  Blood tests.  Tests for sexually transmitted infections (STIs). If you have had more than one UTI, a cystoscopy or imaging studies may be done to determine the cause of the infections. How is this treated? Treatment for this condition includes:  Antibiotic medicine.  Over-the-counter medicines to treat discomfort.  Drinking enough water to stay hydrated. If you have frequent infections or have other conditions such as a kidney stone, you may need to see a health care provider who specializes in the urinary tract (urologist). In rare cases, urinary tract infections can cause sepsis. Sepsis is a life-threatening condition that occurs when the body responds to an infection. Sepsis is treated in the hospital with IV antibiotics, fluids, and other medicines. Follow these instructions at home:  Medicines  Take over-the-counter and prescription medicines only as told by your health care provider.  If you were prescribed an antibiotic medicine, take it as  told by your health care provider. Do not stop using the antibiotic even if you start to feel better. General instructions  Make sure you: ? Empty your bladder often and completely. Do not hold urine for long periods of time. ? Empty your bladder after sex. ? Wipe from front to back after a bowel movement if you are female. Use each tissue one time when you wipe.  Drink enough fluid to keep your urine pale yellow.  Keep all follow-up visits as told by your health care provider. This is important. Contact a health care provider if:  Your symptoms do not get better after 1-2 days.  Your symptoms go away and then return. Get help right away if you have:  Severe pain in your back or your lower abdomen.  A fever.  Nausea or vomiting. Summary  A urinary tract infection (UTI) is an infection of any part of the urinary tract, which includes the kidneys, ureters, bladder, and urethra.  Most urinary tract infections are caused by bacteria in your genital area, around the entrance to your urinary tract (urethra).  Treatment for this condition often includes antibiotic medicines.  If you were prescribed an antibiotic medicine, take it as told by your health care provider. Do not stop using the antibiotic even if you start to feel better.  Keep all follow-up visits as told by your health care provider. This is important. This information is not intended to replace advice given to you by your health care provider. Make sure you discuss any questions you have with your health care provider. Document Released: 03/07/2005 Document Revised: 12/05/2017 Document Reviewed: 12/05/2017 Elsevier Interactive Patient Education  2019 [employer].  Viral Illness, Adult Viruses are tiny germs that can get into a person's body and cause illness. There are many different types of viruses, and they cause many types of illness. Viral illnesses can range from mild to severe. They can affect various parts of  the body. Common illnesses that are caused by a virus include colds and the flu. Viral illnesses also include serious conditions such as HIV/AIDS (human immunodeficiency virus/acquired immunodeficiency syndrome). A few viruses have been linked to certain cancers. What are the causes? Many types of viruses can cause illness. Viruses invade cells in your body, multiply, and cause the infected cells to malfunction or die. When the cell dies, it releases more of the virus. When this happens, you develop symptoms of the illness, and the virus continues to spread to other cells. If the virus takes over the function of the cell, it can cause the cell to divide and grow out of control, as is the case when a virus causes cancer. Different viruses get into the body in different ways. You can get a virus by:  Swallowing food or water that is contaminated with the virus.  Breathing in droplets that have been coughed or sneezed into the air by an infected person.  Touching a surface that has been contaminated with the virus and then touching your eyes, nose, or mouth.  Being bitten by an insect or animal that carries the virus.  Having sexual contact with a person who is infected with the virus.  Being exposed to blood or fluids that contain the virus, either through an open cut or during a transfusion. If a virus enters your body, your body's defense system (immune system) will try to fight the virus. You may be at higher risk for a viral illness if your immune system is weak. What are the signs or symptoms? Symptoms vary depending on the type of virus and the location of the cells that it invades. Common symptoms of the main types of viral illnesses include: Cold and flu viruses  Fever.  Headache.  Sore throat.  Muscle aches.  Nasal congestion.  Cough. Digestive system (gastrointestinal) viruses  Fever.  Abdominal pain.  Nausea.  Diarrhea. Liver viruses (hepatitis)  Loss of  appetite.  Tiredness.  Yellowing of the skin (jaundice). Brain and spinal cord viruses  Fever.  Headache.  Stiff neck.  Nausea and vomiting.  Confusion or sleepiness.  Skin viruses  Warts.  Itching.  Rash. Sexually transmitted viruses  Discharge.  Swelling.  Redness.  Rash. How is this treated? Viruses can be difficult to treat because they live within cells. Antibiotic medicines do not treat viruses because these drugs do not get inside cells. Treatment for a viral illness may include:  Resting and drinking plenty of fluids.  Medicines to relieve symptoms. These can include over-the-counter medicine for pain and fever, medicines for cough or congestion, and medicines to relieve diarrhea.  Antiviral medicines. These drugs are available only for certain types of viruses. They may help reduce flu symptoms if taken early. There are also many antiviral medicines for hepatitis and HIV/AIDS. Some viral illnesses can be prevented with vaccinations. A common example is the flu shot. Follow these instructions at home: Medicines   Take over-the-counter and prescription medicines only as told by your health care provider.  If you were prescribed an antiviral medicine, take it as told by your health care provider. Do not stop taking the medicine even if you start to feel better.  Be aware of when antibiotics are needed and when they are not needed. Antibiotics do not treat viruses. If your health care provider thinks that you may have a bacterial infection as well as a viral infection, you may get an antibiotic. ? Do not ask for an antibiotic prescription if you have been diagnosed with a viral illness. That will not make your illness go away faster. ? Frequently taking antibiotics when they are not needed can lead to antibiotic resistance. When this develops, the medicine no longer works against the bacteria that it normally fights. General instructions  Drink enough fluids  to keep your urine clear or pale yellow.  Rest as much as possible.  Return to your normal activities as told by your health care provider. Ask your health care provider what activities are safe for you.  Keep all follow-up visits as told by your health care provider. This is important. How is this prevented? Take these actions to reduce your risk of viral infection:  Eat a healthy diet and get enough rest.  Wash your hands often with soap and water. This is especially important when you are in public places. If soap and water are not available, use hand sanitizer.  Avoid close contact with friends and family who have a viral illness.  If you travel to areas where viral gastrointestinal infection is common, avoid drinking water or eating raw food.  Keep your immunizations up to date. Get a flu shot every year as told by your health care provider.  Do not share toothbrushes, nail clippers, razors, or needles with other people.  Always practice safe sex.  Contact a health care provider if:  You have symptoms of a viral illness that do not go away.  Your symptoms come back after going away.  Your symptoms get worse. Get help right away if:  You have trouble breathing.  You have a severe headache or a stiff neck.  You have severe vomiting or abdominal pain. This information is not intended to replace advice given to you by your health care provider. Make sure you discuss any questions you have with your health care provider. Document Released: 10/07/2015 Document Revised: 11/09/2015 Document Reviewed: 10/07/2015 Elsevier Interactive Patient Education  2019 [employer]   Cough, Adult  Coughing is a reflex that clears your throat and your airways. Coughing helps to heal and protect your lungs. It is normal to cough  occasionally, but a cough that happens with other symptoms or lasts a long time may be a sign of a condition that needs treatment. A cough may last only 2-3 weeks  (acute), or it may last longer than 8 weeks (chronic). What are the causes? Coughing is commonly caused by:  Breathing in substances that irritate your lungs.  A viral or bacterial respiratory infection.  Allergies.  Asthma.  Postnasal drip.  Smoking.  Acid backing up from the stomach into the esophagus (gastroesophageal reflux).  Certain medicines.  Chronic lung problems, including COPD (or rarely, lung cancer).  Other medical conditions such as heart failure. Follow these instructions at home: Pay attention to any changes in your symptoms. Take these actions to help with your discomfort:  Take medicines only as told by your health care provider. ? If you were prescribed an antibiotic medicine, take it as told by your health care provider. Do not stop taking the antibiotic even if you start to feel better. ? Talk with your health care provider before you take a cough suppressant medicine.  Drink enough fluid to keep your urine clear or pale yellow.  If the air is dry, use a cold steam vaporizer or humidifier in your bedroom or your home to help loosen secretions.  Avoid anything that causes you to cough at work or at home.  If your cough is worse at night, try sleeping in a semi-upright position.  Avoid cigarette smoke. If you smoke, quit smoking. If you need help quitting, ask your health care provider.  Avoid caffeine.  Avoid alcohol.  Rest as needed. Contact a health care provider if:  You have new symptoms.  You cough up pus.  Your cough does not get better after 2-3 weeks, or your cough gets worse.  You cannot control your cough with suppressant medicines and you are losing sleep.  You develop pain that is getting worse or pain that is not controlled with pain medicines.  You have a fever.  You have unexplained weight loss.  You have night sweats. Get help right away if:  You cough up blood.  You have difficulty breathing.  Your heartbeat is  very fast. This information is not intended to replace advice given to you by your health care provider. Make sure you discuss any questions you have with your health care provider. Document Released: 11/24/2010 Document Revised: 11/03/2015 Document Reviewed: 08/04/2014 Elsevier Interactive Patient Education  2019 [employer].   Fatigue If you have fatigue, you feel tired all the time and have a lack of energy or a lack of motivation. Fatigue may make it difficult to start or complete tasks because of exhaustion. In general, occasional or mild fatigue is often a normal response to activity or life. However, long-lasting (chronic) or extreme fatigue may be a symptom of a medical condition. Follow these instructions at home: General instructions  Watch your fatigue for any changes.  Go to bed and get up at the same time every day.  Avoid fatigue by pacing yourself during the day and getting enough sleep at night.  Maintain a healthy weight. Medicines  Take over-the-counter and prescription medicines only as told by your health care provider.  Take a multivitamin, if told by your health care provider.  Do not use herbal or dietary supplements unless they are approved by your health care provider. Activity   Exercise regularly, as told by your health care provider.  Use or practice techniques to help you relax, such as  yoga, tai chi, meditation, or massage therapy. Eating and drinking   Avoid heavy meals in the evening.  Eat a well-balanced diet, which includes lean proteins, whole grains, plenty of fruits and vegetables, and low-fat dairy products.  Avoid consuming too much caffeine.  Avoid the use of alcohol.  Drink enough fluid to keep your urine pale yellow. Lifestyle  Change situations that cause you stress. Try to keep your work and personal schedule in balance.  Do not use any products that contain nicotine or tobacco, such as cigarettes and e-cigarettes. If you  need help quitting, ask your health care provider.  Do not use drugs. Contact a health care provider if:  Your fatigue does not get better.  You have a fever.  You suddenly lose or gain weight.  You have headaches.  You have trouble falling asleep or sleeping through the night.  You feel angry, guilty, anxious, or sad.  You are unable to have a bowel movement (constipation).  Your skin is dry.  You have swelling in your legs or another part of your body. Get help right away if:  You feel confused.  Your vision is blurry.  You feel faint or you pass out.  You have a severe headache.  You have severe pain in your abdomen, your back, or the area between your waist and hips (pelvis).  You have chest pain, shortness of breath, or an irregular or fast heartbeat.  You are unable to urinate, or you urinate less than normal.  You have abnormal bleeding, such as bleeding from the rectum, vagina, nose, lungs, or nipples.  You vomit blood.  You have thoughts about hurting yourself or others. If you ever feel like you may hurt yourself or others, or have thoughts about taking your own life, get help right away. You can go to your nearest emergency department or call:  Your local emergency services (911 in the [place]  A suicide crisis helpline, such as the [place] at 505-035-1775. This is open 24 hours a day. Summary  If you have fatigue, you feel tired all the time and have a lack of energy or a lack of motivation.  Fatigue may make it difficult to start or complete tasks because of exhaustion.  Long-lasting (chronic) or extreme fatigue may be a symptom of a medical condition.  Exercise regularly, as told by your health care provider.  Change situations that cause you stress. Try to keep your work and personal schedule in balance. This information is not intended to replace advice given to you by your health care provider. Make sure you  discuss any questions you have with your health care provider. Document Released: 03/25/2007 Document Revised: 02/20/2017 Document Reviewed: 02/20/2017 Elsevier Interactive Patient Education  [employer].   If you have lab work done today you will be contacted with your lab results within the next 2 weeks.  If you have not heard from [place] then please contact us. The fastest way to get your results is to register for My Chart.   IF you received an x-ray today, you will receive an invoice from [facility]. Please contact [facility] at [phone] with questions or concerns regarding your invoice.   IF you received labwork today, you will receive an invoice from [facility]. Please contact [facility] at [phone] with questions or concerns regarding your invoice.   Our billing staff will not be able to assist you with questions regarding bills from these companies.  You will  be contacted with the lab results as soon as they are available. The fastest way to get your results is to activate your My Chart account. Instructions are located on the last page of this paperwork. If you have not heard from [place] regarding the results in 2 weeks, please contact this office.       Signed,   Merri Ray, MD [facility].  07/26/18 11:39 AM

## 2018-07-22 NOTE — Patient Instructions (Addendum)
Start antibiotic twice per day for urinary tract infection.  Blood counts were reassuring.    Chest x-ray did not show pneumonia. mucinex if needed for cough.  Tessalon Perles are also an option   I suspect you had a viral illness which should be improving this week.  Small sips of fluids frequently to help with fatigue.  I will check some other electrolytes to make sure those look okay.  Zofran if needed for nausea or vomiting.  Thank you for coming in today.  Recheck next 3 to 4 days if not starting to improve, sooner if worse  Return to the clinic or go to the nearest emergency room if any of your symptoms worsen or new symptoms occur.   Urinary Tract Infection, Adult  A urinary tract infection (UTI) is an infection of any part of the urinary tract. The urinary tract includes the kidneys, ureters, bladder, and urethra. These organs make, store, and get rid of urine in the body. Your health care provider may use other names to describe the infection. An upper UTI affects the ureters and kidneys (pyelonephritis). A lower UTI affects the bladder (cystitis) and urethra (urethritis). What are the causes? Most urinary tract infections are caused by bacteria in your genital area, around the entrance to your urinary tract (urethra). These bacteria grow and cause inflammation of your urinary tract. What increases the risk? You are more likely to develop this condition if:  You have a urinary catheter that stays in place (indwelling).  You are not able to control when you urinate or have a bowel movement (you have incontinence).  You are female and you: ? Use a spermicide or diaphragm for birth control. ? Have low estrogen levels. ? Are pregnant.  You have certain genes that increase your risk (genetics).  You are sexually active.  You take antibiotic medicines.  You have a condition that causes your flow of urine to slow down, such as: ? An enlarged prostate, if you are female. ? Blockage  in your urethra (stricture). ? A kidney stone. ? A nerve condition that affects your bladder control (neurogenic bladder). ? Not getting enough to drink, or not urinating often.  You have certain medical conditions, such as: ? Diabetes. ? A weak disease-fighting system (immunesystem). ? Sickle cell disease. ? Gout. ? Spinal cord injury. What are the signs or symptoms? Symptoms of this condition include:  Needing to urinate right away (urgently).  Frequent urination or passing small amounts of urine frequently.  Pain or burning with urination.  Blood in the urine.  Urine that smells bad or unusual.  Trouble urinating.  Cloudy urine.  Vaginal discharge, if you are female.  Pain in the abdomen or the lower back. You may also have:  Vomiting or a decreased appetite.  Confusion.  Irritability or tiredness.  A fever.  Diarrhea. The first symptom in older adults may be confusion. In some cases, they may not have any symptoms until the infection has worsened. How is this diagnosed? This condition is diagnosed based on your medical history and a physical exam. You may also have other tests, including:  Urine tests.  Blood tests.  Tests for sexually transmitted infections (STIs). If you have had more than one UTI, a cystoscopy or imaging studies may be done to determine the cause of the infections. How is this treated? Treatment for this condition includes:  Antibiotic medicine.  Over-the-counter medicines to treat discomfort.  Drinking enough water to stay hydrated. If you  have frequent infections or have other conditions such as a kidney stone, you may need to see a health care provider who specializes in the urinary tract (urologist). In rare cases, urinary tract infections can cause sepsis. Sepsis is a life-threatening condition that occurs when the body responds to an infection. Sepsis is treated in the hospital with IV antibiotics, fluids, and other  medicines. Follow these instructions at home:  Medicines  Take over-the-counter and prescription medicines only as told by your health care provider.  If you were prescribed an antibiotic medicine, take it as told by your health care provider. Do not stop using the antibiotic even if you start to feel better. General instructions  Make sure you: ? Empty your bladder often and completely. Do not hold urine for long periods of time. ? Empty your bladder after sex. ? Wipe from front to back after a bowel movement if you are female. Use each tissue one time when you wipe.  Drink enough fluid to keep your urine pale yellow.  Keep all follow-up visits as told by your health care provider. This is important. Contact a health care provider if:  Your symptoms do not get better after 1-2 days.  Your symptoms go away and then return. Get help right away if you have:  Severe pain in your back or your lower abdomen.  A fever.  Nausea or vomiting. Summary  A urinary tract infection (UTI) is an infection of any part of the urinary tract, which includes the kidneys, ureters, bladder, and urethra.  Most urinary tract infections are caused by bacteria in your genital area, around the entrance to your urinary tract (urethra).  Treatment for this condition often includes antibiotic medicines.  If you were prescribed an antibiotic medicine, take it as told by your health care provider. Do not stop using the antibiotic even if you start to feel better.  Keep all follow-up visits as told by your health care provider. This is important. This information is not intended to replace advice given to you by your health care provider. Make sure you discuss any questions you have with your health care provider. Document Released: 03/07/2005 Document Revised: 12/05/2017 Document Reviewed: 12/05/2017 Elsevier Interactive Patient Education  2019 [employer].  Viral Illness, Adult Viruses are tiny germs  that can get into a person's body and cause illness. There are many different types of viruses, and they cause many types of illness. Viral illnesses can range from mild to severe. They can affect various parts of the body. Common illnesses that are caused by a virus include colds and the flu. Viral illnesses also include serious conditions such as HIV/AIDS (human immunodeficiency virus/acquired immunodeficiency syndrome). A few viruses have been linked to certain cancers. What are the causes? Many types of viruses can cause illness. Viruses invade cells in your body, multiply, and cause the infected cells to malfunction or die. When the cell dies, it releases more of the virus. When this happens, you develop symptoms of the illness, and the virus continues to spread to other cells. If the virus takes over the function of the cell, it can cause the cell to divide and grow out of control, as is the case when a virus causes cancer. Different viruses get into the body in different ways. You can get a virus by:  Swallowing food or water that is contaminated with the virus.  Breathing in droplets that have been coughed or sneezed into the air by an infected person.  Touching a surface that has been contaminated with the virus and then touching your eyes, nose, or mouth.  Being bitten by an insect or animal that carries the virus.  Having sexual contact with a person who is infected with the virus.  Being exposed to blood or fluids that contain the virus, either through an open cut or during a transfusion. If a virus enters your body, your body's defense system (immune system) will try to fight the virus. You may be at higher risk for a viral illness if your immune system is weak. What are the signs or symptoms? Symptoms vary depending on the type of virus and the location of the cells that it invades. Common symptoms of the main types of viral illnesses include: Cold and flu  viruses  Fever.  Headache.  Sore throat.  Muscle aches.  Nasal congestion.  Cough. Digestive system (gastrointestinal) viruses  Fever.  Abdominal pain.  Nausea.  Diarrhea. Liver viruses (hepatitis)  Loss of appetite.  Tiredness.  Yellowing of the skin (jaundice). Brain and spinal cord viruses  Fever.  Headache.  Stiff neck.  Nausea and vomiting.  Confusion or sleepiness. Skin viruses  Warts.  Itching.  Rash. Sexually transmitted viruses  Discharge.  Swelling.  Redness.  Rash. How is this treated? Viruses can be difficult to treat because they live within cells. Antibiotic medicines do not treat viruses because these drugs do not get inside cells. Treatment for a viral illness may include:  Resting and drinking plenty of fluids.  Medicines to relieve symptoms. These can include over-the-counter medicine for pain and fever, medicines for cough or congestion, and medicines to relieve diarrhea.  Antiviral medicines. These drugs are available only for certain types of viruses. They may help reduce flu symptoms if taken early. There are also many antiviral medicines for hepatitis and HIV/AIDS. Some viral illnesses can be prevented with vaccinations. A common example is the flu shot. Follow these instructions at home: Medicines   Take over-the-counter and prescription medicines only as told by your health care provider.  If you were prescribed an antiviral medicine, take it as told by your health care provider. Do not stop taking the medicine even if you start to feel better.  Be aware of when antibiotics are needed and when they are not needed. Antibiotics do not treat viruses. If your health care provider thinks that you may have a bacterial infection as well as a viral infection, you may get an antibiotic. ? Do not ask for an antibiotic prescription if you have been diagnosed with a viral illness. That will not make your illness go away  faster. ? Frequently taking antibiotics when they are not needed can lead to antibiotic resistance. When this develops, the medicine no longer works against the bacteria that it normally fights. General instructions  Drink enough fluids to keep your urine clear or pale yellow.  Rest as much as possible.  Return to your normal activities as told by your health care provider. Ask your health care provider what activities are safe for you.  Keep all follow-up visits as told by your health care provider. This is important. How is this prevented? Take these actions to reduce your risk of viral infection:  Eat a healthy diet and get enough rest.  Wash your hands often with soap and water. This is especially important when you are in public places. If soap and water are not available, use hand sanitizer.  Avoid close contact with friends and family  who have a viral illness.  If you travel to areas where viral gastrointestinal infection is common, avoid drinking water or eating raw food.  Keep your immunizations up to date. Get a flu shot every year as told by your health care provider.  Do not share toothbrushes, nail clippers, razors, or needles with other people.  Always practice safe sex.  Contact a health care provider if:  You have symptoms of a viral illness that do not go away.  Your symptoms come back after going away.  Your symptoms get worse. Get help right away if:  You have trouble breathing.  You have a severe headache or a stiff neck.  You have severe vomiting or abdominal pain. This information is not intended to replace advice given to you by your health care provider. Make sure you discuss any questions you have with your health care provider. Document Released: 10/07/2015 Document Revised: 11/09/2015 Document Reviewed: 10/07/2015 Elsevier Interactive Patient Education  2019 [employer]   Cough, Adult  Coughing is a reflex that clears your throat and your  airways. Coughing helps to heal and protect your lungs. It is normal to cough occasionally, but a cough that happens with other symptoms or lasts a long time may be a sign of a condition that needs treatment. A cough may last only 2-3 weeks (acute), or it may last longer than 8 weeks (chronic). What are the causes? Coughing is commonly caused by:  Breathing in substances that irritate your lungs.  A viral or bacterial respiratory infection.  Allergies.  Asthma.  Postnasal drip.  Smoking.  Acid backing up from the stomach into the esophagus (gastroesophageal reflux).  Certain medicines.  Chronic lung problems, including COPD (or rarely, lung cancer).  Other medical conditions such as heart failure. Follow these instructions at home: Pay attention to any changes in your symptoms. Take these actions to help with your discomfort:  Take medicines only as told by your health care provider. ? If you were prescribed an antibiotic medicine, take it as told by your health care provider. Do not stop taking the antibiotic even if you start to feel better. ? Talk with your health care provider before you take a cough suppressant medicine.  Drink enough fluid to keep your urine clear or pale yellow.  If the air is dry, use a cold steam vaporizer or humidifier in your bedroom or your home to help loosen secretions.  Avoid anything that causes you to cough at work or at home.  If your cough is worse at night, try sleeping in a semi-upright position.  Avoid cigarette smoke. If you smoke, quit smoking. If you need help quitting, ask your health care provider.  Avoid caffeine.  Avoid alcohol.  Rest as needed. Contact a health care provider if:  You have new symptoms.  You cough up pus.  Your cough does not get better after 2-3 weeks, or your cough gets worse.  You cannot control your cough with suppressant medicines and you are losing sleep.  You develop pain that is getting worse  or pain that is not controlled with pain medicines.  You have a fever.  You have unexplained weight loss.  You have night sweats. Get help right away if:  You cough up blood.  You have difficulty breathing.  Your heartbeat is very fast. This information is not intended to replace advice given to you by your health care provider. Make sure you discuss any questions you have with your health  care provider. Document Released: 11/24/2010 Document Revised: 11/03/2015 Document Reviewed: 08/04/2014 Elsevier Interactive Patient Education  2019 [employer].   Fatigue If you have fatigue, you feel tired all the time and have a lack of energy or a lack of motivation. Fatigue may make it difficult to start or complete tasks because of exhaustion. In general, occasional or mild fatigue is often a normal response to activity or life. However, long-lasting (chronic) or extreme fatigue may be a symptom of a medical condition. Follow these instructions at home: General instructions  Watch your fatigue for any changes.  Go to bed and get up at the same time every day.  Avoid fatigue by pacing yourself during the day and getting enough sleep at night.  Maintain a healthy weight. Medicines  Take over-the-counter and prescription medicines only as told by your health care provider.  Take a multivitamin, if told by your health care provider.  Do not use herbal or dietary supplements unless they are approved by your health care provider. Activity   Exercise regularly, as told by your health care provider.  Use or practice techniques to help you relax, such as yoga, tai chi, meditation, or massage therapy. Eating and drinking   Avoid heavy meals in the evening.  Eat a well-balanced diet, which includes lean proteins, whole grains, plenty of fruits and vegetables, and low-fat dairy products.  Avoid consuming too much caffeine.  Avoid the use of alcohol.  Drink enough fluid to keep  your urine pale yellow. Lifestyle  Change situations that cause you stress. Try to keep your work and personal schedule in balance.  Do not use any products that contain nicotine or tobacco, such as cigarettes and e-cigarettes. If you need help quitting, ask your health care provider.  Do not use drugs. Contact a health care provider if:  Your fatigue does not get better.  You have a fever.  You suddenly lose or gain weight.  You have headaches.  You have trouble falling asleep or sleeping through the night.  You feel angry, guilty, anxious, or sad.  You are unable to have a bowel movement (constipation).  Your skin is dry.  You have swelling in your legs or another part of your body. Get help right away if:  You feel confused.  Your vision is blurry.  You feel faint or you pass out.  You have a severe headache.  You have severe pain in your abdomen, your back, or the area between your waist and hips (pelvis).  You have chest pain, shortness of breath, or an irregular or fast heartbeat.  You are unable to urinate, or you urinate less than normal.  You have abnormal bleeding, such as bleeding from the rectum, vagina, nose, lungs, or nipples.  You vomit blood.  You have thoughts about hurting yourself or others. If you ever feel like you may hurt yourself or others, or have thoughts about taking your own life, get help right away. You can go to your nearest emergency department or call:  Your local emergency services (911 in the [place]  A suicide crisis helpline, such as the [facility] at (929)758-9222. This is open 24 hours a day. Summary  If you have fatigue, you feel tired all the time and have a lack of energy or a lack of motivation.  Fatigue may make it difficult to start or complete tasks because of exhaustion.  Long-lasting (chronic) or extreme fatigue may be a symptom of a medical condition.  Exercise regularly, as told by  your health care provider.  Change situations that cause you stress. Try to keep your work and personal schedule in balance. This information is not intended to replace advice given to you by your health care provider. Make sure you discuss any questions you have with your health care provider. Document Released: 03/25/2007 Document Revised: 02/20/2017 Document Reviewed: 02/20/2017 Elsevier Interactive Patient Education  [employer].   If you have lab work done today you will be contacted with your lab results within the next 2 weeks.  If you have not heard from [place] then please contact us. The fastest way to get your results is to register for My Chart.   IF you received an x-ray today, you will receive an invoice from [facility]. Please contact [facility] at [phone] with questions or concerns regarding your invoice.   IF you received labwork today, you will receive an invoice from [facility]. Please contact [facility] at [phone] with questions or concerns regarding your invoice.   Our billing staff will not be able to assist you with questions regarding bills from these companies.  You will be contacted with the lab results as soon as they are available. The fastest way to get your results is to activate your My Chart account. Instructions are located on the last page of this paperwork. If you have not heard from [place] regarding the results in 2 weeks, please contact this office.

## 2018-07-23 LAB — BASIC METABOLIC PANEL
BUN/Creatinine Ratio: 10 (ref 9–23)
BUN: 7 mg/dL (ref 6–24)
CO2: 17 mmol/L — ABNORMAL LOW (ref 20–29)
CREATININE: 0.68 mg/dL (ref 0.57–1.00)
Calcium: 10 mg/dL (ref 8.7–10.2)
Chloride: 107 mmol/L — ABNORMAL HIGH (ref 96–106)
GFR calc Af Amer: 116 mL/min/{1.73_m2} (ref >59)
GFR calc non Af Amer: 101 mL/min/{1.73_m2} (ref >59)
Glucose: 92 mg/dL (ref 65–99)
Potassium: 4.7 mmol/L (ref 3.5–5.2)
Sodium: 140 mmol/L (ref 134–144)

## 2018-07-25 LAB — URINE CULTURE

## 2018-07-26 ENCOUNTER — Encounter: Payer: Self-pay | Admitting: Family Medicine

## 2018-07-27 ENCOUNTER — Other Ambulatory Visit: Payer: Self-pay | Admitting: Family Medicine

## 2018-07-27 DIAGNOSIS — E878 Other disorders of electrolyte and fluid balance, not elsewhere classified: Secondary | ICD-10-CM

## 2018-07-27 NOTE — Progress Notes (Signed)
Bmp

## 2018-08-08 ENCOUNTER — Telehealth: Payer: Self-pay | Admitting: Family Medicine

## 2018-08-08 NOTE — Telephone Encounter (Signed)
Pt called in and was given message regarding lab results from Dr. Carlota Raspberry dated 07/27/2018 at 3:09 PM.  She is going to come in 08/09/2018 (Saturday) and have her blood drawn (BMP).  She has completed the antibiotic for the UTI.  She verbalized understanding of going to the ER if she feels worse than her last visit.

## 2018-08-09 ENCOUNTER — Ambulatory Visit (INDEPENDENT_AMBULATORY_CARE_PROVIDER_SITE_OTHER): Payer: Commercial Managed Care - PPO | Admitting: Family Medicine

## 2018-08-09 DIAGNOSIS — E878 Other disorders of electrolyte and fluid balance, not elsewhere classified: Secondary | ICD-10-CM | POA: Diagnosis not present

## 2018-08-10 LAB — BASIC METABOLIC PANEL
BUN / CREAT RATIO: 10 (ref 9–23)
BUN: 8 mg/dL (ref 6–24)
CO2: 18 mmol/L — ABNORMAL LOW (ref 20–29)
Calcium: 10.3 mg/dL — ABNORMAL HIGH (ref 8.7–10.2)
Chloride: 108 mmol/L — ABNORMAL HIGH (ref 96–106)
Creatinine, Ser: 0.8 mg/dL (ref 0.57–1.00)
GFR calc non Af Amer: 85 mL/min/{1.73_m2} (ref >59)
GFR, EST AFRICAN AMERICAN: 98 mL/min/{1.73_m2} (ref >59)
Glucose: 92 mg/dL (ref 65–99)
Potassium: 4.6 mmol/L (ref 3.5–5.2)
SODIUM: 140 mmol/L (ref 134–144)

## 2018-08-25 ENCOUNTER — Encounter: Payer: Self-pay | Admitting: Radiology

## 2018-09-09 ENCOUNTER — Encounter (HOSPITAL_COMMUNITY): Payer: Self-pay | Admitting: *Deleted

## 2018-09-09 ENCOUNTER — Telehealth (INDEPENDENT_AMBULATORY_CARE_PROVIDER_SITE_OTHER): Payer: Commercial Managed Care - PPO | Admitting: Family Medicine

## 2018-09-09 ENCOUNTER — Emergency Department (HOSPITAL_COMMUNITY): Payer: Commercial Managed Care - PPO

## 2018-09-09 ENCOUNTER — Other Ambulatory Visit: Payer: Self-pay

## 2018-09-09 ENCOUNTER — Emergency Department (HOSPITAL_COMMUNITY)
Admission: EM | Admit: 2018-09-09 | Discharge: 2018-09-09 | Disposition: A | Discharge status: Home / Self Care | Payer: Commercial Managed Care - PPO | Attending: Emergency Medicine | Admitting: Emergency Medicine

## 2018-09-09 DIAGNOSIS — I252 Old myocardial infarction: Secondary | ICD-10-CM | POA: Insufficient documentation

## 2018-09-09 DIAGNOSIS — E785 Hyperlipidemia, unspecified: Secondary | ICD-10-CM | POA: Insufficient documentation

## 2018-09-09 DIAGNOSIS — A084 Viral intestinal infection, unspecified: Secondary | ICD-10-CM | POA: Diagnosis not present

## 2018-09-09 DIAGNOSIS — R05 Cough: Secondary | ICD-10-CM | POA: Diagnosis not present

## 2018-09-09 DIAGNOSIS — Z87891 Personal history of nicotine dependence: Secondary | ICD-10-CM | POA: Diagnosis not present

## 2018-09-09 DIAGNOSIS — R0602 Shortness of breath: Secondary | ICD-10-CM | POA: Diagnosis not present

## 2018-09-09 DIAGNOSIS — Z9861 Coronary angioplasty status: Secondary | ICD-10-CM

## 2018-09-09 DIAGNOSIS — Z79899 Other long term (current) drug therapy: Secondary | ICD-10-CM | POA: Insufficient documentation

## 2018-09-09 DIAGNOSIS — J029 Acute pharyngitis, unspecified: Secondary | ICD-10-CM

## 2018-09-09 DIAGNOSIS — R0789 Other chest pain: Secondary | ICD-10-CM | POA: Diagnosis not present

## 2018-09-09 DIAGNOSIS — Z20828 Contact with and (suspected) exposure to other viral communicable diseases: Secondary | ICD-10-CM

## 2018-09-09 DIAGNOSIS — R059 Cough, unspecified: Secondary | ICD-10-CM

## 2018-09-09 DIAGNOSIS — I2510 Atherosclerotic heart disease of native coronary artery without angina pectoris: Secondary | ICD-10-CM | POA: Insufficient documentation

## 2018-09-09 DIAGNOSIS — Z7982 Long term (current) use of aspirin: Secondary | ICD-10-CM | POA: Insufficient documentation

## 2018-09-09 DIAGNOSIS — Z20822 Contact with and (suspected) exposure to covid-19: Secondary | ICD-10-CM

## 2018-09-09 LAB — CBC WITH DIFFERENTIAL/PLATELET
Abs Immature Granulocytes: 0.02 10*3/uL (ref 0.00–0.07)
Basophils Absolute: 0.1 10*3/uL (ref 0.0–0.1)
Basophils Relative: 1 %
Eosinophils Absolute: 0.2 10*3/uL (ref 0.0–0.5)
Eosinophils Relative: 3 %
HCT: 41.8 % (ref 36.0–46.0)
Hemoglobin: 13.6 g/dL (ref 12.0–15.0)
Immature Granulocytes: 0 %
Lymphocytes Relative: 33 %
Lymphs Abs: 2.9 10*3/uL (ref 0.7–4.0)
MCH: 29.3 pg (ref 26.0–34.0)
MCHC: 32.5 g/dL (ref 30.0–36.0)
MCV: 90.1 fL (ref 80.0–100.0)
MONO ABS: 0.6 10*3/uL (ref 0.1–1.0)
MONOS PCT: 7 %
Neutro Abs: 4.8 10*3/uL (ref 1.7–7.7)
Neutrophils Relative %: 56 %
Platelets: 338 10*3/uL (ref 150–400)
RBC: 4.64 MIL/uL (ref 3.87–5.11)
RDW: 12.6 % (ref 11.5–15.5)
WBC: 8.6 10*3/uL (ref 4.0–10.5)
nRBC: 0 % (ref 0.0–0.2)

## 2018-09-09 LAB — BASIC METABOLIC PANEL
Anion gap: 12 (ref 5–15)
BUN: 8 mg/dL (ref 6–20)
CO2: 20 mmol/L — ABNORMAL LOW (ref 22–32)
CREATININE: 0.8 mg/dL (ref 0.44–1.00)
Calcium: 9.9 mg/dL (ref 8.9–10.3)
Chloride: 109 mmol/L (ref 98–111)
GFR calc Af Amer: >60 mL/min (ref >60)
GFR calc non Af Amer: >60 mL/min (ref >60)
Glucose, Bld: 84 mg/dL (ref 70–99)
Potassium: 3.8 mmol/L (ref 3.5–5.1)
Sodium: 141 mmol/L (ref 135–145)

## 2018-09-09 LAB — TROPONIN I: Troponin I: <0.03 ng/mL (ref <0.03)

## 2018-09-09 NOTE — ED Provider Notes (Signed)
[facility] DEPART[facility] Provider Note   CSN: [id number] Arrival date & time: 09/09/18  1615    History   Chief Complaint Chief Complaint  Patient presents with   Shortness of Breath    HPI [name] is a 53 y.o. female with a past medical history of N STEMI, sarcoidosis, who presents today for evaluation of cough and shortness of breath.  She reports that she has had a cough for approximately 8 days.  Over the past 2 days she has had chest pain.  Her pain is made worse with movement and palpation of her chest.  Her pain is located in the middle of her chest and under her left breast.  Her pain worsens with deep breathing.  She had a telemedicine visit with her provider today who sent her here for further evaluation.  She says that this feels similar to her NSTEMI in the past however notes that she did not have a cough then.   She denies any leg swelling.  She reports compliance with all of her medications.  She does not have a history of DVT or PE.    HPI  Past Medical History:  Diagnosis Date   Anemia    Fibroid    Hyperlipidemia    NSTEMI (non-ST elevated myocardial infarction) ([place]) 06/2017   s/p DES to LCX 07/02/17   Sarcoidosis     Patient Active Problem List   Diagnosis Date Noted   Flu-like symptoms 06/17/2018   Cough 06/17/2018   Lower respiratory infection 06/17/2018   Chest pain, rule out acute myocardial infarction 12/20/2017   CAD (coronary artery disease) 12/20/2017   Lung nodule, multiple 07/04/2017   Post PTCA 07/04/2017   Abnormal EKG    Acute chest pain    Femoral artery hematoma complicating cardiac catheterization 07/02/2017   Hyperlipidemia with target LDL less than 70 07/02/2017   Tobacco abuse 07/02/2017   NSTEMI (non-ST elevated myocardial infarction) ([place])    Knee LCL sprain 03/20/2017   Rectal bleeding 10/18/2016   Special screening for malignant neoplasms, colon 10/18/2016   Vitamin D  deficiency 04/16/2015    Past Surgical History:  Procedure Laterality Date   ABDOMINAL HYSTERECTOMY     CESAREAN SECTION  1987   CORONARY/GRAFT ACUTE MI REVASCULARIZATION N/A 06/30/2017   Procedure: Coronary/Graft Acute MI Revascularization;  Surgeon: Lorretta Harp, MD;  Location: [facility];  Service: Cardiovascular;  Laterality: N/A;   FOREARM FRACTURE SURGERY Right 1977   FRACTURE SURGERY     LEFT HEART CATH AND CORONARY ANGIOGRAPHY N/A 06/30/2017   Procedure: LEFT HEART CATH AND CORONARY ANGIOGRAPHY;  Surgeon: Lorretta Harp, MD;  Location: [facility];  Service: Cardiovascular;  Laterality: N/A;   LEFT HEART CATH AND CORONARY ANGIOGRAPHY N/A 12/23/2017   Procedure: LEFT HEART CATH AND CORONARY ANGIOGRAPHY;  Surgeon: Lorretta Harp, MD;  Location: [facility];  Service: Cardiovascular;  Laterality: N/A;   TUBAL LIGATION  1990     OB History   No obstetric history on file.      Home Medications    Prior to Admission medications   Medication Sig Start Date End Date Taking? Authorizing Provider  albuterol (PROVENTIL HFA;VENTOLIN HFA) 108 (90 Base) MCG/ACT inhaler Inhale 2 puffs into the lungs every 4 (four) hours as needed for wheezing or shortness of breath. 05/20/18   Joy, Helane Gunther, PA-C  aspirin EC 81 MG tablet Take 81 mg by mouth daily.  [provider]  atorvastatin (LIPITOR) 80 MG tablet Take 1 tablet (80 mg total) by mouth daily. Patient taking differently: Take 80 mg by mouth at bedtime.  12/02/17 03/26/26  Shawnee Knapp, MD  benzonatate (TESSALON) 100 MG capsule Take 1 capsule (100 mg total) by mouth 2 (two) times daily as needed for cough. 07/22/18   Wendie Agreste, MD  BRILINTA 90 MG TABS tablet TAKE 1 TABLET (90 MG TOTAL) BY MOUTH 2 (TWO) TIMES DAILY. 07/15/18   Lorretta Harp, MD  hydrocortisone (ANUSOL-HC) 2.5 % rectal cream Place 1 application rectally 2 (two) times daily. 04/28/18   Shawnee Knapp, MD  lisinopril  (PRINIVIL,ZESTRIL) 2.5 MG tablet Take 1 tablet (2.5 mg total) by mouth daily. 12/02/17   Shawnee Knapp, MD  metoprolol tartrate (LOPRESSOR) 25 MG tablet Take 0.5 tablets (12.5 mg total) by mouth 2 (two) times daily. 12/02/17   Shawnee Knapp, MD  naproxen (NAPROSYN) 500 MG tablet Take 1 tablet (500 mg total) by mouth 2 (two) times daily with a meal. 03/26/18   Larene Pickett, PA-C  nitrofurantoin, macrocrystal-monohydrate, (MACROBID) 100 MG capsule Take 1 capsule (100 mg total) by mouth 2 (two) times daily. 07/22/18   Wendie Agreste, MD  nitroGLYCERIN (NITROSTAT) 0.4 MG SL tablet Place 1 tablet (0.4 mg total) under the tongue every 5 (five) minutes x 3 doses as needed for chest pain. 07/04/17   Tommie Raymond, NP  ondansetron (ZOFRAN ODT) 4 MG disintegrating tablet Take 1 tablet (4 mg total) by mouth every 8 (eight) hours as needed for nausea or vomiting. 07/22/18   Wendie Agreste, MD    Family History Family History  Problem Relation Age of Onset   Diabetes Mother    Stroke Mother 93   Hypertension Mother    Hyperlipidemia Father    Stroke Father 68   Hypertension Father    Breast cancer Paternal Grandmother     Social History Social History   Tobacco Use   Smoking status: Former Smoker    Packs/day: 1.00    Years: 29.00    Pack years: 29.00    Types: Cigarettes    Last attempt to quit: 03/19/2018    Years since quitting: 0.4   Smokeless tobacco: Never Used  Substance Use Topics   Alcohol use: Yes    Comment: 12/20/2017 "couple drinks once/yr"   Drug use: Never     Allergies   Hydrocodone   Review of Systems Review of Systems  Constitutional: Positive for fatigue. Negative for chills and fever.  HENT: Positive for sore throat. Negative for congestion.   Respiratory: Positive for cough, chest tightness and shortness of breath.   Cardiovascular: Positive for chest pain. Negative for palpitations and leg swelling.  Gastrointestinal: Positive for diarrhea.  Negative for abdominal pain.  Genitourinary: Negative for dysuria and hematuria.  Neurological: Negative for headaches.  All other systems reviewed and are negative.    Physical Exam Updated Vital Signs BP (!) 136/93 (BP Location: Right Arm)    Pulse 72    Temp 98.2 F (36.8 C) (Oral)    Resp (!) 25    Ht 5' 4.5" (1.638 m)    Wt 74.8 kg    SpO2 100%    BMI 27.88 kg/m   Physical Exam Vitals signs and nursing note reviewed.  Constitutional:      General: She is not in acute distress.    Appearance: She is well-developed. She is not ill-appearing.  HENT:     Head: Normocephalic and atraumatic.  Eyes:     Conjunctiva/sclera: Conjunctivae normal.  Neck:     Musculoskeletal: Neck supple.  Cardiovascular:     Rate and Rhythm: Normal rate and regular rhythm.     Heart sounds: No murmur.  Pulmonary:     Effort: Pulmonary effort is normal. Tachypnea present. No respiratory distress.     Breath sounds: Normal breath sounds. No decreased breath sounds, wheezing, rhonchi or rales.  Chest:     Chest wall: Tenderness present.  Abdominal:     Palpations: Abdomen is soft. There is no mass.     Tenderness: There is no abdominal tenderness.  Skin:    General: Skin is warm and dry.  Neurological:     General: No focal deficit present.     Mental Status: She is alert and oriented to person, place, and time.  Psychiatric:        Mood and Affect: Mood normal.        Behavior: Behavior normal.      ED Treatments / Results  Labs (all labs ordered are listed, but only abnormal results are displayed) Labs Reviewed  BASIC METABOLIC PANEL - Abnormal; Notable for the following components:      Result Value   CO2 20 (*)    All other components within normal limits  CBC WITH DIFFERENTIAL/PLATELET  TROPONIN I    EKG EKG Interpretation  Date/Time:  Tuesday September 09 2018 16:31:49 EDT Ventricular Rate:  74 PR Interval:    QRS Duration: 123 QT Interval:  339 QTC Calculation: 376 R  Axis:   23 Text Interpretation:  Sinus rhythm Nonspecific intraventricular conduction delay No significant change since last tracing Confirmed by Virgel Manifold [id number]) on 09/09/2018 6:04:19 PM   Radiology Dg Chest Port 1 View  Result Date: 09/09/2018 CLINICAL DATA:  Cough, shortness of breath. EXAM: PORTABLE CHEST 1 VIEW COMPARISON:  Radiographs of July 22, 2018. FINDINGS: The heart size and mediastinal contours are within normal limits. Both lungs are clear. No pneumothorax or pleural effusion is noted. The visualized skeletal structures are unremarkable. IMPRESSION: No active disease. Electronically Signed   By: Marijo Conception, M.D.   On: 09/09/2018 18:16    Procedures Procedures (including critical care time)  Medications Ordered in ED Medications - No data to display   Initial Impression / Assessment and Plan / ED Course  I have reviewed the triage vital signs and the nursing notes.  Pertinent labs & imaging results that were available during my care of the patient were reviewed by me and considered in my medical decision making (see chart for details).  Clinical Course as of Sep 09 2126  Tue Sep 09, 2018  1925 Patient updated on results by telephone through the door.  She expressed her dissatisfaction that she is not being tested today as her primary care doctor told her she would be tested.  I explained to her that based on our current testing criteria we are unable to test her from the emergency room today.  Prior to talking on the phone her respiratory rate was about 16.    [EH]    Clinical Course User Index [EH] Lorin Glass, PA-C        [name] was evaluated in Emergency Department on 09/09/2018 for the symptoms described in the history of present illness. She was evaluated in the context of the global COVID-19 pandemic, which necessitated consideration that the patient might  be at risk for infection with the SARS-CoV-2 virus that causes COVID-19.  Institutional protocols and algorithms that pertain to the evaluation of patients at risk for COVID-19 are in a state of rapid change based on information released by regulatory bodies including the CDC and federal and state organizations. These policies and algorithms were followed during the patient's care in the ED.  Patient presents today for evaluation of shortness of breath and chest pain.  Her chest pain is only present when she coughs and she has had a cough for approximately 8 days.  She does have a history of a Nstemi.  Chest x-ray was obtained without evidence of acute abnormality.  EKG does not show evidence of acute ischemia, appears similar to previous EKGs.  Labs were obtained and reviewed without significant electrolyte or hematologic derangements.  Troponin is not elevated.  Given that she has had this chest pain intermittently for 2 days and it is reproducible with palpation I suspect that it is musculoskeletal based from her cough.  Given the prevalence of COVID-19 I have recommended that she quarantine herself at home.  We discussed quarantine instructions and she states her understanding.  With current guidelines unable to test her for COVID-19 in the emergency room as she is hemodynamically stable, not tachycardic, not consistently tachypneic and is not hypoxic and does not require admission.  Work note was given.  Return precautions were discussed with patient who states their understanding.  At the time of discharge patient denied any unaddressed complaints or concerns.  Patient is agreeable for discharge home.   Final Clinical Impressions(s) / ED Diagnoses   Final diagnoses:  Shortness of breath  Sore throat    ED Discharge Orders    None       Ollen Gross 09/09/18 2128    Virgel Manifold, MD 09/11/18 1237

## 2018-09-09 NOTE — Progress Notes (Signed)
CC: cough/loose stools x 1 1/2 weeks, sob, hacky cough and sore throat.   Pt advises she has been ? 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  Pt advises she is around people all day driving and not sure if she was exposed to covid by bus riders or recent coworker.  Pt had heart attack last yr and is very concerned and daughter and grandson in house with her and is scared for them as well.

## 2018-09-09 NOTE — ED Notes (Signed)
Pt upset about being discharged. Removed all leads and bp cuff. Would not allow last set of vital signs. No questions upon discharge. Attempted to discuss dc papers. Pt left with no s/sx acute distress

## 2018-09-09 NOTE — ED Triage Notes (Signed)
PT saw on the news some one  Had a positive COVID[place]19  [place]. . She  sent from PCP to get checked for COVID - 19. 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 PT does not know if  The person was on her bus. PT has a dry cough.

## 2018-09-09 NOTE — Progress Notes (Signed)
Telemedicine Encounter- SOAP NOTE Established Patient  This telephone encounter was conducted with the patient's (or proxy's) verbal consent via audio telecommunications: yes/no: Yes Patient was instructed to have this encounter in a suitably private space; and to only have persons present to whom they give permission to participate. In addition, patient identity was confirmed by use of name plus two identifiers (DOB and address).  I discussed the limitations, risks, security and privacy concerns of performing an evaluation and management service by telephone and the availability of in person appointments. I also discussed with the patient that there may be a patient responsible charge related to this service. The patient expressed understanding and agreed to proceed.  I spent a total of TIME; 0 MIN TO 60 MIN: 15 minutes talking with the patient or their proxy.  CC: body aches, sore throat, shortness of breath Subjective   [name] is a 53 y.o. established patient. 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 but has a daughter/grandson Her last day of work was 09/01/2018 She states that she has been at home medicating with theraflu and alka seltzer  She denies fevers but was taking cold meds She states that she has been feeling the same but continues to have shortness of breath She states that her pulse races with any activity She feels tired with headaches but denies dizziness   She reports that she has chest pains and had a heart attack in 2019 and s/p stenting She is still smoking but has not since she started getting ill She has a 12 pack year history   Patient Active Problem List   Diagnosis Date Noted   Flu-like symptoms 06/17/2018   Cough 06/17/2018   Lower respiratory infection  06/17/2018   Chest pain, rule out acute myocardial infarction 12/20/2017   CAD (coronary artery disease) 12/20/2017   Lung nodule, multiple 07/04/2017   Post PTCA 07/04/2017   Abnormal EKG    Acute chest pain    Femoral artery hematoma complicating cardiac catheterization 07/02/2017   Hyperlipidemia with target LDL less than 70 07/02/2017   Tobacco abuse 07/02/2017   NSTEMI (non-ST elevated myocardial infarction) ([place])    Knee LCL sprain 03/20/2017   Rectal bleeding 10/18/2016   Special screening for malignant neoplasms, colon 10/18/2016   Vitamin D deficiency 04/16/2015    Past Medical History:  Diagnosis Date   Anemia    Fibroid    Hyperlipidemia    NSTEMI (non-ST elevated myocardial infarction) ([place]) 06/2017   s/p DES to LCX 07/02/17   Sarcoidosis     Current Outpatient Medications  Medication Sig Dispense Refill   albuterol (PROVENTIL HFA;VENTOLIN HFA) 108 (90 Base) MCG/ACT inhaler Inhale 2 puffs into the lungs every 4 (four) hours as needed for wheezing or shortness of breath. 1 Inhaler 0   aspirin EC 81 MG tablet Take 81 mg by mouth daily.     atorvastatin (LIPITOR) 80 MG tablet Take 1 tablet (80 mg total) by mouth daily. (Patient taking differently: Take 80 mg by mouth at bedtime. ) 90 tablet 3   benzonatate (TESSALON) 100 MG capsule Take 1 capsule (100 mg total) by mouth 2 (two) times daily as needed for cough. 20 capsule 0   BRILINTA 90 MG TABS tablet TAKE 1  TABLET (90 MG TOTAL) BY MOUTH 2 (TWO) TIMES DAILY. 60 tablet 8   hydrocortisone (ANUSOL-HC) 2.5 % rectal cream Place 1 application rectally 2 (two) times daily. 30 g 0   lisinopril (PRINIVIL,ZESTRIL) 2.5 MG tablet Take 1 tablet (2.5 mg total) by mouth daily. 90 tablet 3   metoprolol tartrate (LOPRESSOR) 25 MG tablet Take 0.5 tablets (12.5 mg total) by mouth 2 (two) times daily. 90 tablet 2   naproxen (NAPROSYN) 500 MG tablet Take 1 tablet (500 mg total) by mouth 2 (two) times daily with a  meal. 30 tablet 0   nitroGLYCERIN (NITROSTAT) 0.4 MG SL tablet Place 1 tablet (0.4 mg total) under the tongue every 5 (five) minutes x 3 doses as needed for chest pain. 25 tablet 0   nitrofurantoin, macrocrystal-monohydrate, (MACROBID) 100 MG capsule Take 1 capsule (100 mg total) by mouth 2 (two) times daily. (Patient not taking: Reported on 09/09/2018) 14 capsule 0   ondansetron (ZOFRAN ODT) 4 MG disintegrating tablet Take 1 tablet (4 mg total) by mouth every 8 (eight) hours as needed for nausea or vomiting. (Patient not taking: Reported on 09/09/2018) 10 tablet 0   No current facility-administered medications for this visit.     Allergies  Allergen Reactions   Hydrocodone Hives and Itching    Social History   Socioeconomic History   Marital status: Widowed    Spouse name: Not on file   Number of children: 2   Years of education: Not on file   Highest education level: Not on file  Occupational History   Occupation: Chiropodist: Careers adviser strain: Not on file   Food insecurity:    Worry: Not on file    Inability: Not on file   Transportation needs:    Medical: Not on file    Non-medical: Not on file  Tobacco Use   Smoking status: Former Smoker    Packs/day: 1.00    Years: 29.00    Pack years: 29.00    Types: Cigarettes    Last attempt to quit: 03/19/2018    Years since quitting: 0.4   Smokeless tobacco: Never Used  Substance and Sexual Activity   Alcohol use: Yes    Comment: 12/20/2017 "couple drinks once/yr"   Drug use: Never   Sexual activity: Not on file  Lifestyle   Physical activity:    Days per week: Not on file    Minutes per session: Not on file   Stress: Not on file  Relationships   Social connections:    Talks on phone: Not on file    Gets together: Not on file    Attends religious service: Not on file    Active member of club or organization: Not on file    Attends  meetings of clubs or organizations: Not on file    Relationship status: Not on file   Intimate partner violence:    Fear of current or ex partner: Not on file    Emotionally abused: Not on file    Physically abused: Not on file    Forced sexual activity: Not on file  Other Topics Concern   Not on file  Social History Narrative   Not on file    ROS  Review of Systems Constitutional: see hpi HENT: see hpi Respiratory:see hpi   Gastrointestinal: see hpi  Genitourinary: Negative for difficulty urinating, dysuria, flank pain and hematuria.  Musculoskeletal: see hpi Neurological:  see hpi See HPI. All other review of systems negative.    Objective   Vitals as reported by the patient: There were no vitals filed for this visit.  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  Will refer to the ER for evaluation of her shortness of breath and testing.  She has a heart disease and had a NSTEMI a year ago.   She lives close to [facility].  She was advised to go to the ER.   I discussed the assessment and treatment plan with the patient. The patient was provided an opportunity to ask questions and all were answered. The patient agreed with the plan and demonstrated an understanding of the instructions.   The patient was advised to call back or seek an in-person evaluation if the symptoms worsen or if the condition fails to improve as anticipated.  I provided 15 minutes of non-face-to-face time during this encounter.  Forrest Moron, MD  Primary Care at [facility]

## 2018-09-09 NOTE — Discharge Instructions (Addendum)
As we discussed today we are currently only able to test, from the emergency room, patient's for coronavirus who are sick enough to require admission into the hospital.  Based on your symptoms you may have coronavirus and I have given you the isolation instructions as if you do.  You must remained quarantined for a minimum of 7 days.  After that once you have had no symptoms and been fever free for 3 days you may return to work based on current guidelines.  Guidelines are constantly changing so please follow-up with the resources listed below.  Your chest x-ray did not show any pneumonia or other acute abnormality.  Your heart enzyme was not elevated.  Your EKG did not show any evidence of a heart attack.     Person Under Monitoring Name: [name]  Location: [address] [place] [address]   Infection Prevention Recommendations for Individuals Confirmed to have, or Being Evaluated for, 2019 Novel Coronavirus (COVID-19) Infection Who Receive Care at Home  Individuals who are confirmed to have, or are being evaluated for, COVID-19 should follow the prevention steps below until a healthcare provider or local or state health department says they can return to normal activities.  Stay home except to get medical care You should restrict activities outside your home, except for getting medical care. Do not go to work, school, or public areas, and do not use public transportation or taxis.  Call ahead before visiting your doctor Before your medical appointment, call the healthcare provider and tell them that you have, or are being evaluated for, COVID-19 infection. This will help the healthcare providers office take steps to keep other people from getting infected. Ask your healthcare provider to call the local or state health department.  Monitor your symptoms Seek prompt medical attention if your illness is worsening (e.g., difficulty breathing). Before going to your  medical appointment, call the healthcare provider and tell them that you have, or are being evaluated for, COVID-19 infection. Ask your healthcare provider to call the local or state health department.  Wear a facemask You should wear a facemask that covers your nose and mouth when you are in the same room with other people and when you visit a healthcare provider. People who live with or visit you should also wear a facemask while they are in the same room with you.  Separate yourself from other people in your home As much as possible, you should stay in a different room from other people in your home. Also, you should use a separate bathroom, if available.  Avoid sharing household items You should not share dishes, drinking glasses, cups, eating utensils, towels, bedding, or other items with other people in your home. After using these items, you should wash them thoroughly with soap and water.  Cover your coughs and sneezes Cover your mouth and nose with a tissue when you cough or sneeze, or you can cough or sneeze into your sleeve. Throw used tissues in a lined trash can, and immediately wash your hands with soap and water for at least 20 seconds or use an alcohol-based hand rub.  Wash your [facility] your hands often and thoroughly with soap and water for at least 20 seconds. You can use an alcohol-based hand sanitizer if soap and water are not available and if your hands are not visibly dirty. Avoid touching your eyes, nose, and mouth with unwashed hands.   Prevention Steps for Caregivers and Household Members of Individuals Confirmed  to have, or Being Evaluated for, COVID-19 Infection Being Cared for in the Home  If you live with, or provide care at home for, a person confirmed to have, or being evaluated for, COVID-19 infection please follow these guidelines to prevent infection:  Follow healthcare providers instructions Make sure that you understand and can help the  patient follow any healthcare provider instructions for all care.  Provide for the patients basic needs You should help the patient with basic needs in the home and provide support for getting groceries, prescriptions, and other personal needs.  Monitor the patients symptoms If they are getting sicker, call his or her medical provider and tell them that the patient has, or is being evaluated for, COVID-19 infection. This will help the healthcare providers office take steps to keep other people from getting infected. Ask the healthcare provider to call the local or state health department.  Limit the number of people who have contact with the patient If possible, have only one caregiver for the patient. Other household members should stay in another home or place of residence. If this is not possible, they should stay in another room, or be separated from the patient as much as possible. Use a separate bathroom, if available. Restrict visitors who do not have an essential need to be in the home.  Keep older adults, very young children, and other sick people away from the patient Keep older adults, very young children, and those who have compromised immune systems or chronic health conditions away from the patient. This includes people with chronic heart, lung, or kidney conditions, diabetes, and cancer.  Ensure good ventilation Make sure that shared spaces in the home have good air flow, such as from an air conditioner or an opened window, weather permitting.  Wash your hands often Wash your hands often and thoroughly with soap and water for at least 20 seconds. You can use an alcohol based hand sanitizer if soap and water are not available and if your hands are not visibly dirty. Avoid touching your eyes, nose, and mouth with unwashed hands. Use disposable paper towels to dry your hands. If not available, use dedicated cloth towels and replace them when they become wet.  Wear a  facemask and gloves Wear a disposable facemask at all times in the room and gloves when you touch or have contact with the patients blood, body fluids, and/or secretions or excretions, such as sweat, saliva, sputum, nasal mucus, vomit, urine, or feces.  Ensure the mask fits over your nose and mouth tightly, and do not touch it during use. Throw out disposable facemasks and gloves after using them. Do not reuse. Wash your hands immediately after removing your facemask and gloves. If your personal clothing becomes contaminated, carefully remove clothing and launder. Wash your hands after handling contaminated clothing. Place all used disposable facemasks, gloves, and other waste in a lined container before disposing them with other household waste. Remove gloves and wash your hands immediately after handling these items.  Do not share dishes, glasses, or other household items with the patient Avoid sharing household items. You should not share dishes, drinking glasses, cups, eating utensils, towels, bedding, or other items with a patient who is confirmed to have, or being evaluated for, COVID-19 infection. After the person uses these items, you should wash them thoroughly with soap and water.  Wash laundry thoroughly Immediately remove and wash clothes or bedding that have blood, body fluids, and/or secretions or excretions, such as sweat, saliva,  sputum, nasal mucus, vomit, urine, or feces, on them. Wear gloves when handling laundry from the patient. Read and follow directions on labels of laundry or clothing items and detergent. In general, wash and dry with the warmest temperatures recommended on the label.  Clean all areas the individual has used often Clean all touchable surfaces, such as counters, tabletops, doorknobs, bathroom fixtures, toilets, phones, keyboards, tablets, and bedside tables, every day. Also, clean any surfaces that may have blood, body fluids, and/or secretions or excretions  on them. Wear gloves when cleaning surfaces the patient has come in contact with. Use a diluted bleach solution (e.g., dilute bleach with 1 part bleach and 10 parts water) or a household disinfectant with a label that says EPA-registered for coronaviruses. To make a bleach solution at home, add 1 tablespoon of bleach to 1 quart (4 cups) of water. For a larger supply, add  cup of bleach to 1 gallon (16 cups) of water. Read labels of cleaning products and follow recommendations provided on product labels. Labels contain instructions for safe and effective use of the cleaning product including precautions you should take when applying the product, such as wearing gloves or eye protection and making sure you have good ventilation during use of the product. Remove gloves and wash hands immediately after cleaning.  Monitor yourself for signs and symptoms of illness Caregivers and household members are considered close contacts, should monitor their health, and will be asked to limit movement outside of the home to the extent possible. Follow the monitoring steps for close contacts listed on the symptom monitoring form.   ? If you have additional questions, contact your local health department or call the epidemiologist on call at [phone] (available 24/7). ? This guidance is subject to change. For the most up-to-date guidance from [facility], please refer to their website: [email or web address]

## 2018-09-10 ENCOUNTER — Telehealth: Payer: Self-pay | Admitting: Family Medicine

## 2018-09-10 NOTE — Telephone Encounter (Signed)
Copied from [place] [id number]. Topic: Quick Communication - See Telephone Encounter >> Sep 10, 2018 12:46 PM Nils Flack wrote: CRM for notification. See Telephone encounter for: 09/10/18. Pt called - she needs a letter to give to her job to be quarantined for 14 days as said.   Also, she said that she went to [facility] to be tested for covid and they said that they don't test anyone.  Fax number to send letter - [phone] refernce to Timmothy Sours [employer] back [phone]  She needs this done right away.  Please have someone call pt to answer questions

## 2018-09-10 NOTE — Telephone Encounter (Signed)
Pt needs tele med apt can not just give out note needs to be evaluated.

## 2018-09-15 ENCOUNTER — Telehealth: Payer: Self-pay

## 2018-09-15 NOTE — Telephone Encounter (Signed)
Pt calling to request note for work so she will not lose her job.  Pt seen at [facility] ED dept on 09/09/2018 and evaluated for respiratory symptoms and told to go home and quarantine for 14 days.  Advise pt note will be given. Fax to send note is [phone].  Tried faxing note twice with to failed faxes.   Contacted pt via phone to advised fax number not working and to contact employer for alternate number.  Pt agreeable and would like for me to call her back in 10 mins.  Advised I would.

## 2018-09-16 ENCOUNTER — Telehealth: Payer: Commercial Managed Care - PPO | Admitting: Family Medicine

## 2018-09-22 ENCOUNTER — Telehealth: Payer: Self-pay | Admitting: Family Medicine

## 2018-09-22 NOTE — Telephone Encounter (Signed)
Copied from [place] [id number]. Topic: General - Other >> Sep 22, 2018  2:56 PM Leward Quan A wrote: Reason for CRM: Patient called to say that she have been on quarantine since 09/16/2018 and she is not feeling any better she is needing an extension note for her employer. Please advise. Ph# [phone]

## 2018-09-23 ENCOUNTER — Telehealth: Payer: Self-pay | Admitting: Family Medicine

## 2018-09-23 ENCOUNTER — Telehealth: Payer: Self-pay

## 2018-09-23 NOTE — Telephone Encounter (Signed)
Patient is still not feeling well , she needs an extension on her Work note to extend quarantine for another 14 days Patient would like a call back [phone]

## 2018-09-23 NOTE — Telephone Encounter (Signed)
Pt's letter was sent to her employer approving 2 more wks of quarantine time. Letter faxed to [phone]. Pt is aware that during her this time she is to call and make a telephone appt with Dr.Stalling for further eval. Verbalized understanding

## 2018-09-23 NOTE — Telephone Encounter (Signed)
Needs another tele med visit to be evaluated.

## 2018-10-13 ENCOUNTER — Telehealth: Payer: Self-pay | Admitting: Family Medicine

## 2018-10-13 NOTE — Telephone Encounter (Signed)
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

## 2018-10-15 NOTE — Telephone Encounter (Signed)
Dr Nolon Rod I did not know how you would like to handle this request. Not sure if you wanted to keep her out of work for that long.

## 2018-10-17 ENCOUNTER — Other Ambulatory Visit: Payer: Self-pay

## 2018-10-17 ENCOUNTER — Telehealth (INDEPENDENT_AMBULATORY_CARE_PROVIDER_SITE_OTHER): Payer: Commercial Managed Care - PPO | Admitting: Family Medicine

## 2018-10-17 DIAGNOSIS — Z20828 Contact with and (suspected) exposure to other viral communicable diseases: Secondary | ICD-10-CM

## 2018-10-17 DIAGNOSIS — R05 Cough: Secondary | ICD-10-CM | POA: Diagnosis not present

## 2018-10-17 DIAGNOSIS — Z20822 Contact with and (suspected) exposure to covid-19: Secondary | ICD-10-CM

## 2018-10-17 NOTE — Progress Notes (Signed)
Telemedicine Encounter- SOAP NOTE Established Patient  This telephone encounter was conducted with the patient's (or proxy's) verbal consent via audio telecommunications: yes/no: Yes Patient was instructed to have this encounter in a suitably private space; and to only have persons present to whom they give permission to participate. In addition, patient identity was confirmed by use of name plus two identifiers (DOB and address).  I discussed the limitations, risks, security and privacy concerns of performing an evaluation and management service by telephone and the availability of in person appointments. I also discussed with the patient that there may be a patient responsible charge related to this service. The patient expressed understanding and agreed to proceed.  I spent a total of TIME; 0 MIN TO 60 MIN: 20 minutes talking with the patient or their proxy.  CC:  Concern about COVID  Subjective   [name] is a 53 y.o. established patient. 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  Patient Active Problem List   Diagnosis Date Noted  . Flu-like symptoms 06/17/2018  . Cough 06/17/2018  . Lower respiratory infection 06/17/2018  . Chest pain, rule out acute myocardial infarction 12/20/2017  . CAD (coronary artery disease) 12/20/2017  . Lung nodule, multiple 07/04/2017  . Post PTCA 07/04/2017  . Abnormal EKG   . Acute chest pain   . Femoral artery hematoma complicating cardiac catheterization 07/02/2017  . Hyperlipidemia with target LDL less than 70 07/02/2017  . Tobacco abuse 07/02/2017  . NSTEMI (non-ST elevated myocardial infarction) ([place])    . Knee LCL sprain 03/20/2017  . Rectal bleeding 10/18/2016  . Special screening for malignant neoplasms, colon 10/18/2016  . Vitamin D deficiency 04/16/2015    Past Medical History:  Diagnosis Date  . Anemia   . Fibroid   . Hyperlipidemia   . NSTEMI (non-ST elevated myocardial infarction) ([place]) 06/2017   s/p DES to LCX 07/02/17  . Sarcoidosis     Current Outpatient Medications  Medication Sig Dispense Refill  . albuterol (PROVENTIL HFA;VENTOLIN HFA) 108 (90 Base) MCG/ACT inhaler Inhale 2 puffs into the lungs every 4 (four) hours as needed for wheezing or shortness of breath. 1 Inhaler 0  . aspirin EC 81 MG tablet Take 81 mg by mouth daily.    Marland Kitchen atorvastatin (LIPITOR) 80 MG tablet Take 1 tablet (80 mg total) by mouth daily. (Patient taking differently: Take 80 mg by mouth at bedtime. ) 90 tablet 3  . benzonatate (TESSALON) 100 MG capsule Take 1 capsule (100 mg total) by mouth 2 (two) times daily as needed for cough. 20 capsule 0  . BRILINTA 90 MG TABS tablet TAKE 1 TABLET (90 MG TOTAL) BY MOUTH 2 (TWO) TIMES DAILY. 60 tablet 8  . hydrocortisone (ANUSOL-HC) 2.5 % rectal cream Place 1 application rectally 2 (two) times daily. 30 g 0  . lisinopril (PRINIVIL,ZESTRIL) 2.5 MG tablet Take 1 tablet (2.5 mg total) by mouth daily. 90 tablet 3  . metoprolol tartrate (LOPRESSOR) 25 MG tablet Take 0.5 tablets (12.5 mg total) by mouth 2 (two) times daily. 90 tablet 2  . naproxen (NAPROSYN) 500 MG tablet Take 1 tablet (500 mg total)  by mouth 2 (two) times daily with a meal. 30 tablet 0  . nitrofurantoin, macrocrystal-monohydrate, (MACROBID) 100 MG capsule Take 1 capsule (100 mg total) by mouth 2 (two) times daily. 14 capsule 0  . nitroGLYCERIN (NITROSTAT) 0.4 MG SL tablet Place 1 tablet (0.4 mg total) under the tongue every 5 (five) minutes x 3 doses as needed for chest pain. 25 tablet 0  . ondansetron (ZOFRAN ODT) 4 MG disintegrating tablet Take 1 tablet (4 mg total) by mouth every 8 (eight) hours as  needed for nausea or vomiting. 10 tablet 0   No current facility-administered medications for this visit.     Allergies  Allergen Reactions  . Hydrocodone Hives and Itching    Social History   Socioeconomic History  . Marital status: Widowed    Spouse name: Not on file  . Number of children: 2  . Years of education: Not on file  . Highest education level: Not on file  Occupational History  . Occupation: Chiropodist: [employer]  . Financial resource strain: Not on file  . Food insecurity:    Worry: Not on file    Inability: Not on file  . Transportation needs:    Medical: Not on file    Non-medical: Not on file  Tobacco Use  . Smoking status: Former Smoker    Packs/day: 1.00    Years: 29.00    Pack years: 29.00    Types: Cigarettes    Last attempt to quit: 03/19/2018    Years since quitting: 0.5  . Smokeless tobacco: Never Used  Substance and Sexual Activity  . Alcohol use: Yes    Comment: 12/20/2017 "couple drinks once/yr"  . Drug use: Never  . Sexual activity: Not on file  Lifestyle  . Physical activity:    Days per week: Not on file    Minutes per session: Not on file  . Stress: Not on file  Relationships  . Social connections:    Talks on phone: Not on file    Gets together: Not on file    Attends religious service: Not on file    Active member of club or organization: Not on file    Attends meetings of clubs or organizations: Not on file    Relationship status: Not on file  . Intimate partner violence:    Fear of current or ex partner: Not on file    Emotionally abused: Not on file    Physically abused: Not on file    Forced sexual activity: Not on file  Other Topics Concern  . Not on file  Social History Narrative  . Not on file    ROS Review of Systems  Constitutional: Negative for activity change, appetite change, chills and fever.  HENT: Negative for congestion, nosebleeds, trouble swallowing and  voice change.   Respiratory: +cough, no shortness of breath or wheezing.   Gastrointestinal: Negative for diarrhea, nausea and vomiting.  Genitourinary: Negative for difficulty urinating, dysuria, flank pain and hematuria.  Musculoskeletal: Negative for back pain, joint swelling and neck pain.  Neurological: Negative for dizziness, speech difficulty, light-headedness and numbness.  See HPI. All other review of systems negative.   Objective   Vitals as reported by the patient: There were no vitals filed for this visit.  Diagnoses and all orders for this visit:  Close Exposure to 2019 Novel Coronavirus   -   Spent time counseling patient on how to avoid  infection now that this is community acquired -   Reviewed her previous visits and ED visit -  Discussed that she should get covid testing and discussed locations where she could go in the [place] and [place] provided for work with recommended accommodations    I discussed the assessment and treatment plan with the patient. The patient was provided an opportunity to ask questions and all were answered. The patient agreed with the plan and demonstrated an understanding of the instructions.   The patient was advised to call back or seek an in-person evaluation if the symptoms worsen or if the condition fails to improve as anticipated.  I provided 20 minutes of non-face-to-face time during this encounter.  Forrest Moron, MD  Primary Care at [facility]

## 2018-11-04 ENCOUNTER — Telehealth: Payer: Self-pay | Admitting: Family Medicine

## 2018-11-12 ENCOUNTER — Telehealth: Payer: Self-pay | Admitting: Cardiovascular Disease

## 2018-11-12 NOTE — Telephone Encounter (Signed)
Phone visit, call [phone] my chart/pre reg complete/consent obtained -- ttf

## 2018-11-18 ENCOUNTER — Telehealth (INDEPENDENT_AMBULATORY_CARE_PROVIDER_SITE_OTHER): Payer: Commercial Managed Care - PPO | Admitting: Cardiovascular Disease

## 2018-11-18 ENCOUNTER — Telehealth: Payer: Self-pay

## 2018-11-18 ENCOUNTER — Encounter: Payer: Self-pay | Admitting: Cardiovascular Disease

## 2018-11-18 DIAGNOSIS — I214 Non-ST elevation (NSTEMI) myocardial infarction: Secondary | ICD-10-CM | POA: Diagnosis not present

## 2018-11-18 DIAGNOSIS — Z9861 Coronary angioplasty status: Secondary | ICD-10-CM

## 2018-11-18 DIAGNOSIS — E785 Hyperlipidemia, unspecified: Secondary | ICD-10-CM | POA: Diagnosis not present

## 2018-11-18 DIAGNOSIS — Z720 Tobacco use: Secondary | ICD-10-CM

## 2018-11-18 DIAGNOSIS — I2510 Atherosclerotic heart disease of native coronary artery without angina pectoris: Secondary | ICD-10-CM | POA: Diagnosis not present

## 2018-11-18 MED ORDER — CLOPIDOGREL BISULFATE 75 MG PO TABS: 75.0000 mg | ORAL_TABLET | Freq: Every day | ORAL | 3 refills | Status: DC

## 2018-11-18 NOTE — Telephone Encounter (Signed)
Left detailed message of 6/9 AVS instructions on patient VM  [phone] per DPR. Letter including After Visit Summary and any other necessary documents to be mailed to the patient's address on file.

## 2018-11-18 NOTE — Patient Instructions (Addendum)
Medication Instructions:  Your physician has recommended you make the following change in your medication:  -[place].  -START TAKING CLOPIDOGREL (PLAVIX) 75 MG, ONE TABLET BY MOUTH DAILY.  -CONTINUE TAKING YOUR ATORVASTATIN (LIPITOR) 80 MG, ONE TABLET BY MOUTH DAILY.  -CONTINUE TAKING ASPIRIN 81 MG, ONE TABLET BY MOUTH DAILY. If you need a refill on your cardiac medications before your next appointment, please call your pharmacy.   Lab work: NONE If you have labs (blood work) drawn today and your tests are completely normal, you will receive your results only by: Marland Kitchen MyChart Message (if you have MyChart) OR . A paper copy in the mail If you have any lab test that is abnormal or we need to change your treatment, we will call you to review the results.  Testing/Procedures: NONE  Follow-Up: At [facility], you and your health needs are our priority.  As part of our continuing mission to provide you with exceptional heart care, we have created designated Provider Care Teams.  These Care Teams include your primary Cardiologist (physician) and Advanced Practice Providers (APPs -  Physician Assistants and Nurse Practitioners) who all work together to provide you with the care you need, when you need it. . You will need a follow up appointment in 12 months.  Please call our office 2 months in advance to schedule this appointment.  You may see Dr. Gwenlyn Found or one of the following Advanced Practice Providers on your designated Care Team:   . Kerin Ransom, [place] . Almyra Deforest, PA-C . Fabian Sharp, PA-C . Jory Sims, DNP . [name], PA-C . Roby Lofts, PA-C . Sande Rives, PA-C

## 2018-11-18 NOTE — Progress Notes (Signed)
Virtual Visit via Video Note   This visit type was conducted due to national recommendations for restrictions regarding the COVID-19 Pandemic (e.g. social distancing) in an effort to limit this patient's exposure and mitigate transmission in our community.  Due to her co-morbid illnesses, this patient is at least at moderate risk for complications without adequate follow up.  This format is felt to be most appropriate for this patient at this time.  All issues noted in this document were discussed and addressed.  A limited physical exam was performed with this format.  Please refer to the patient's chart for her consent to telehealth for [facility].   Date:  11/18/2018   ID:  [name], DOB 07/31/1965, MRN [id number]  Patient Location: Home Provider Location: Home  PCP:  Forrest Moron, MD  Cardiologist:  Quay Burow, MD  Electrophysiologist:  None   Evaluation Performed:  Follow-Up Visit  Chief Complaint: Coronary artery disease  History of Present Illness:    [name] is a 53 y.o.  mildly overweight divorced African-American female mother of 2 children, grandmother to one grandchild who 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  I last saw her in the office 11/12/2017.  She is being seen by me for a post hospital visit after a non-STEMI and occurred on 06/30/17 status post PCI and drug-eluting stenting of the proximal AV groove circumflex. She did have a 70% proximal LAD lesion which did I did not think was physiologically significant. 2-D echo performed 07/01/17 showed normal LV function. She does have a history of sarcoidosisas well. She has long history of tobacco abuse having smoked a pack a day for 30 years currently smoking 4 cigarettes a day. She continues to have atypical constant chest pain.  She had a routine GXT which was abnormal and subsequent Myoview stress test that showed inferolateral scar without ischemia was likely from her circumflex infarct.  She did have a  repeat cardiac catheterization performed 12/23/2017 because of chest pain that showed patent stents with otherwise normal coronary arteries. She has somewhat reduce her tobacco intake down to several cigarettes a day.    I told her that she can go back to work.  Since I saw her a year ago she stopped smoking 6 months ago.  She has occasional atypical chest pain which sounds musculoskeletal.  She continues on Brilinta which we will transition to clopidogrel.  She had an excellent lipid profile 12/02/2017 with an LDL 54 and high-dose atorvastatin.  The patient does not have symptoms concerning for COVID-19 infection (fever, chills, cough, or new shortness of breath).    Past Medical History:  Diagnosis Date  . Anemia   . Fibroid   . Hyperlipidemia   . NSTEMI (non-ST elevated myocardial infarction) ([place]) 06/2017   s/p DES to LCX 07/02/17  . Sarcoidosis    Past Surgical History:  Procedure Laterality Date  . ABDOMINAL HYSTERECTOMY    . CESAREAN SECTION  1987  . CORONARY/GRAFT ACUTE MI REVASCULARIZATION N/A 06/30/2017   Procedure: Coronary/Graft Acute MI Revascularization;  Surgeon: Lorretta Harp, MD;  Location: [place] [facility];  Service: Cardiovascular;  Laterality: N/A;  . FOREARM FRACTURE SURGERY Right 1977  . FRACTURE SURGERY    . LEFT HEART CATH AND CORONARY ANGIOGRAPHY N/A 06/30/2017   Procedure: LEFT HEART CATH AND CORONARY ANGIOGRAPHY;  Surgeon: Lorretta Harp, MD;  Location: [place] [facility];  Service: Cardiovascular;  Laterality: N/A;  . LEFT HEART CATH AND CORONARY  ANGIOGRAPHY N/A 12/23/2017   Procedure: LEFT HEART CATH AND CORONARY ANGIOGRAPHY;  Surgeon: Lorretta Harp, MD;  Location: [place] [facility];  Service: Cardiovascular;  Laterality: N/A;  . TUBAL LIGATION  1990     No outpatient medications have been marked as taking for the 11/18/18 encounter (Appointment) with Lorretta Harp, MD.     Allergies:   Hydrocodone   Social History   Tobacco Use  .  Smoking status: Former Smoker    Packs/day: 1.00    Years: 29.00    Pack years: 29.00    Types: Cigarettes    Last attempt to quit: 03/19/2018    Years since quitting: 0.6  . Smokeless tobacco: Never Used  Substance Use Topics  . Alcohol use: Yes    Comment: 12/20/2017 "couple drinks once/yr"  . Drug use: Never     Family Hx: The patient's family history includes Breast cancer in her paternal grandmother; Diabetes in her mother; Hyperlipidemia in her father; Hypertension in her father and mother; Stroke (age of onset: 46) in her mother; Stroke (age of onset: 35) in her father.  ROS:   Please see the history of present illness.     All other systems reviewed and are negative.   Prior CV studies:   The following studies were reviewed today:  None  Labs/Other Tests and Data Reviewed:    EKG:  No ECG reviewed.  Recent Labs: 12/02/2017: ALT 21 09/09/2018: BUN 8; Creatinine, Ser 0.80; Hemoglobin 13.6; Platelets 338; Potassium 3.8; Sodium 141   Recent Lipid Panel Lab Results  Component Value Date/Time   CHOL 112 12/02/2017 12:01 PM   TRIG 104 12/02/2017 12:01 PM   HDL 37 (L) 12/02/2017 12:01 PM   CHOLHDL 3.0 12/02/2017 12:01 PM   CHOLHDL 4.1 07/01/2017 03:26 AM   LDLCALC 54 12/02/2017 12:01 PM    Wt Readings from Last 3 Encounters:  09/09/18 165 lb (74.8 kg)  07/22/18 175 lb (79.4 kg)  06/17/18 174 lb 9.6 oz (79.2 kg)     Objective:    Vital Signs:  There were no vitals taken for this visit.   VITAL SIGNS:  reviewed GEN:  no acute distress RESPIRATORY:  normal respiratory effort, symmetric expansion NEURO:  alert and oriented x 3, no obvious focal deficit PSYCH:  normal affect  ASSESSMENT & PLAN:    1. Coronary artery disease- history of CAD status post non-STEMI 06/30/2017 with stenting of her AV groove circumflex.  Because of chest pain she underwent repeat catheterization 12/23/2017 that revealed a patent circumflex stent with otherwise normal coronary arteries.   She complains of atypical chest pain which sounds musculoskeletal.  She remains on aspirin and Brilinta which we will transition to clopidogrel. 2. Hyperlipidemia- history of hyperlipidemia on high-dose atorvastatin with lipid profile performed 12/02/2017 revealing total cholesterol 112, LDL 54 and HDL 37 3. Tobacco abuse- discontinued 6 months ago  COVID-19 Education: The signs and symptoms of COVID-19 were discussed with the patient and how to seek care for testing (follow up with PCP or arrange E-visit).  The importance of social distancing was discussed today.  Time:   Today, I have spent 6 minutes with the patient with telehealth technology discussing the above problems.     Medication Adjustments/Labs and Tests Ordered: Current medicines are reviewed at length with the patient today.  Concerns regarding medicines are outlined above.   Tests Ordered: No orders of the defined types were placed in this encounter.   Medication Changes: No  orders of the defined types were placed in this encounter.   Disposition:  Follow up in 1 year(s)  Signed, Quay Burow, MD  11/18/2018 7:44 AM    [facility]

## 2018-12-18 ENCOUNTER — Other Ambulatory Visit: Payer: Self-pay

## 2018-12-18 ENCOUNTER — Emergency Department (HOSPITAL_COMMUNITY)
Admission: EM | Admit: 2018-12-18 | Discharge: 2018-12-18 | Disposition: A | Discharge status: Home / Self Care | Payer: Commercial Managed Care - PPO | Attending: Emergency Medicine | Admitting: Emergency Medicine

## 2018-12-18 ENCOUNTER — Encounter (HOSPITAL_COMMUNITY): Payer: Self-pay | Admitting: Emergency Medicine

## 2018-12-18 DIAGNOSIS — I252 Old myocardial infarction: Secondary | ICD-10-CM | POA: Diagnosis not present

## 2018-12-18 DIAGNOSIS — Z7901 Long term (current) use of anticoagulants: Secondary | ICD-10-CM | POA: Diagnosis not present

## 2018-12-18 DIAGNOSIS — Z7982 Long term (current) use of aspirin: Secondary | ICD-10-CM | POA: Insufficient documentation

## 2018-12-18 DIAGNOSIS — L500 Allergic urticaria: Secondary | ICD-10-CM | POA: Diagnosis not present

## 2018-12-18 DIAGNOSIS — Z87891 Personal history of nicotine dependence: Secondary | ICD-10-CM | POA: Diagnosis not present

## 2018-12-18 DIAGNOSIS — R110 Nausea: Secondary | ICD-10-CM | POA: Diagnosis not present

## 2018-12-18 DIAGNOSIS — R21 Rash and other nonspecific skin eruption: Secondary | ICD-10-CM | POA: Diagnosis present

## 2018-12-18 DIAGNOSIS — Z79899 Other long term (current) drug therapy: Secondary | ICD-10-CM | POA: Insufficient documentation

## 2018-12-18 DIAGNOSIS — T782XXA Anaphylactic shock, unspecified, initial encounter: Secondary | ICD-10-CM | POA: Diagnosis not present

## 2018-12-18 DIAGNOSIS — T7840XA Allergy, unspecified, initial encounter: Secondary | ICD-10-CM

## 2018-12-18 DIAGNOSIS — L509 Urticaria, unspecified: Secondary | ICD-10-CM

## 2018-12-18 DIAGNOSIS — L299 Pruritus, unspecified: Secondary | ICD-10-CM | POA: Diagnosis not present

## 2018-12-18 MED ORDER — DIPHENHYDRAMINE HCL 50 MG/ML IJ SOLN
25.0000 mg | Freq: Once | INTRAMUSCULAR | Status: AC
  Administered 2018-12-18: 25 mg via INTRAVENOUS
  Filled 2018-12-18: qty 1

## 2018-12-18 MED ORDER — EPINEPHRINE 0.3 MG/0.3ML IJ SOAJ
0.3000 mg | Freq: Once | INTRAMUSCULAR | Status: AC
  Administered 2018-12-18: 0.3 mg via INTRAMUSCULAR
  Filled 2018-12-18: qty 0.3

## 2018-12-18 MED ORDER — METHYLPREDNISOLONE SODIUM SUCC 125 MG IJ SOLR
125.0000 mg | Freq: Once | INTRAMUSCULAR | Status: AC
  Administered 2018-12-18: 125 mg via INTRAVENOUS
  Filled 2018-12-18: qty 2

## 2018-12-18 MED ORDER — PREDNISONE 10 MG PO TABS: 50.0000 mg | ORAL_TABLET | Freq: Every day | ORAL | 0 refills | Status: DC

## 2018-12-18 MED ORDER — DIPHENHYDRAMINE HCL 25 MG PO CAPS: 25.0000 mg | ORAL_CAPSULE | Freq: Four times a day (QID) | ORAL | 0 refills | Status: DC | PRN

## 2018-12-18 NOTE — Discharge Instructions (Signed)
We saw you in the ER after you had the allergic reaction.  The reaction is severe, however, it appears to be in control and there is no increased swelling or any difficulty in breathing noted. We are not sure what caused the reaction, and it is important for you to follow up with an allergist. Please take the medications prescribed. PLEASE RETURN TO THE ER IMMEDIATELY IN CASE YOU START HAVING WORSENING SWELLING, DIFFICULTY IN BREATHING ETC.  

## 2018-12-18 NOTE — ED Provider Notes (Signed)
[facility] EMERGENCY DEPARTMENT Provider Note   CSN: [id number] Arrival date & time: 12/18/18  0458    History   Chief Complaint Chief Complaint  Patient presents with  . Urticaria    HPI [name] is a 53 y.o. female.     HPI 53 year old female comes in a chief complaint of itching. She reports that around 10:00 she started having sudden onset of itching all over along with rash.  She has no known history of similar symptoms in the past and denies any new exposures.  Patient has history of NSTEMI, sarcoidosis, hyperlipidemia.  Patient is denying any associated wheezing, swelling within her mouth.  However she is complaining of nausea.  Past Medical History:  Diagnosis Date  . Anemia   . Fibroid   . Hyperlipidemia   . NSTEMI (non-ST elevated myocardial infarction) ([place]) 06/2017   s/p DES to LCX 07/02/17  . Sarcoidosis     Patient Active Problem List   Diagnosis Date Noted  . Flu-like symptoms 06/17/2018  . Cough 06/17/2018  . Lower respiratory infection 06/17/2018  . Chest pain, rule out acute myocardial infarction 12/20/2017  . CAD (coronary artery disease) 12/20/2017  . Lung nodule, multiple 07/04/2017  . Post PTCA 07/04/2017  . Abnormal EKG   . Acute chest pain   . Femoral artery hematoma complicating cardiac catheterization 07/02/2017  . Hyperlipidemia with target LDL less than 70 07/02/2017  . Tobacco abuse 07/02/2017  . NSTEMI (non-ST elevated myocardial infarction) ([place])   . Knee LCL sprain 03/20/2017  . Rectal bleeding 10/18/2016  . Special screening for malignant neoplasms, colon 10/18/2016  . Vitamin D deficiency 04/16/2015    Past Surgical History:  Procedure Laterality Date  . ABDOMINAL HYSTERECTOMY    . CESAREAN SECTION  1987  . CORONARY/GRAFT ACUTE MI REVASCULARIZATION N/A 06/30/2017   Procedure: Coronary/Graft Acute MI Revascularization;  Surgeon: Lorretta Harp, MD;  Location: [place] [facility];  Service:  Cardiovascular;  Laterality: N/A;  . FOREARM FRACTURE SURGERY Right 1977  . FRACTURE SURGERY    . LEFT HEART CATH AND CORONARY ANGIOGRAPHY N/A 06/30/2017   Procedure: LEFT HEART CATH AND CORONARY ANGIOGRAPHY;  Surgeon: Lorretta Harp, MD;  Location: [place] [facility];  Service: Cardiovascular;  Laterality: N/A;  . LEFT HEART CATH AND CORONARY ANGIOGRAPHY N/A 12/23/2017   Procedure: LEFT HEART CATH AND CORONARY ANGIOGRAPHY;  Surgeon: Lorretta Harp, MD;  Location: [place] [facility];  Service: Cardiovascular;  Laterality: N/A;  . TUBAL LIGATION  1990     OB History   No obstetric history on file.      Home Medications    Prior to Admission medications   Medication Sig Start Date End Date Taking? Authorizing Provider  albuterol (PROVENTIL HFA;VENTOLIN HFA) 108 (90 Base) MCG/ACT inhaler Inhale 2 puffs into the lungs every 4 (four) hours as needed for wheezing or shortness of breath. 05/20/18  Yes Joy, Shawn C, PA-C  aspirin EC 81 MG tablet Take 81 mg by mouth daily.   Yes [provider]  atorvastatin (LIPITOR) 80 MG tablet Take 1 tablet (80 mg total) by mouth daily. Patient taking differently: Take 80 mg by mouth at bedtime.  12/02/17 03/26/26 Yes Shawnee Knapp, MD  clopidogrel (PLAVIX) 75 MG tablet Take 1 tablet (75 mg total) by mouth daily. 11/18/18  Yes Lorretta Harp, MD  lisinopril (PRINIVIL,ZESTRIL) 2.5 MG tablet Take 1 tablet (2.5 mg total) by mouth daily. 12/02/17  Yes Brigitte Pulse,  Laurey Arrow, MD  metoprolol tartrate (LOPRESSOR) 25 MG tablet Take 0.5 tablets (12.5 mg total) by mouth 2 (two) times daily. 12/02/17  Yes Shawnee Knapp, MD  nitroGLYCERIN (NITROSTAT) 0.4 MG SL tablet Place 1 tablet (0.4 mg total) under the tongue every 5 (five) minutes x 3 doses as needed for chest pain. 07/04/17  Yes Kathyrn Drown D, NP  diphenhydrAMINE (BENADRYL) 25 mg capsule Take 1 capsule (25 mg total) by mouth every 6 (six) hours as needed for itching. 12/18/18   Varney Biles, MD  predniSONE  (DELTASONE) 10 MG tablet Take 5 tablets (50 mg total) by mouth daily. 12/18/18   Varney Biles, MD    Family History Family History  Problem Relation Age of Onset  . Diabetes Mother   . Stroke Mother 55  . Hypertension Mother   . Hyperlipidemia Father   . Stroke Father 54  . Hypertension Father   . Breast cancer Paternal Grandmother     Social History Social History   Tobacco Use  . Smoking status: Former Smoker    Packs/day: 1.00    Years: 29.00    Pack years: 29.00    Types: Cigarettes    Quit date: 03/19/2018    Years since quitting: 0.7  . Smokeless tobacco: Never Used  Substance Use Topics  . Alcohol use: Yes    Comment: 12/20/2017 "couple drinks once/yr"  . Drug use: Never     Allergies   Hydrocodone   Review of Systems Review of Systems  Constitutional: Positive for activity change.  Gastrointestinal: Positive for nausea.  Skin: Positive for rash.  Allergic/Immunologic: Negative for environmental allergies, food allergies and immunocompromised state.  All other systems reviewed and are negative.    Physical Exam Updated Vital Signs BP 140/86   Pulse 93   Temp 97.7 F (36.5 C) (Oral)   Resp 18   SpO2 99%   Physical Exam Vitals signs and nursing note reviewed.  Constitutional:      Appearance: She is well-developed.  HENT:     Head: Normocephalic and atraumatic.  Neck:     Musculoskeletal: Normal range of motion and neck supple.  Cardiovascular:     Rate and Rhythm: Normal rate.  Pulmonary:     Effort: Pulmonary effort is normal.  Abdominal:     General: Bowel sounds are normal.  Skin:    General: Skin is warm and dry.     Findings: Lesion and rash present.  Neurological:     Mental Status: She is alert and oriented to person, place, and time.      ED Treatments / Results  Labs (all labs ordered are listed, but only abnormal results are displayed) Labs Reviewed - No data to display  EKG None  Radiology No results found.   Procedures .Critical Care Performed by: Varney Biles, MD Authorized by: Varney Biles, MD   Critical care provider statement:    Critical care time (minutes):  31   Critical care was time spent personally by me on the following activities:  Discussions with consultants, evaluation of patient's response to treatment, examination of patient, ordering and performing treatments and interventions, ordering and review of laboratory studies, ordering and review of radiographic studies, pulse oximetry, re-evaluation of patient's condition, obtaining history from patient or surrogate and review of old charts   (including critical care time)  Medications Ordered in ED Medications  EPINEPHrine (EPI-PEN) injection 0.3 mg (0.3 mg Intramuscular Given 12/18/18 0608)  methylPREDNISolone sodium succinate (SOLU-MEDROL) 125 mg/2  mL injection 125 mg (125 mg Intravenous Given 12/18/18 0613)  diphenhydrAMINE (BENADRYL) injection 25 mg (25 mg Intravenous Given 12/18/18 5885)     Initial Impression / Assessment and Plan / ED Course  I have reviewed the triage vital signs and the nursing notes.  Pertinent labs & imaging results that were available during my care of the patient were reviewed by me and considered in my medical decision making (see chart for details).        53 year old female comes in w/ chief complaint of itching, rash and nausea. She has no known history of allergies.  It is unclear what triggered her symptoms.  She has history of an STEMI, sarcoidosis.  Given that she has 2 systems involved, GI and skin -we will treat her as if she has anaphylaxis.  At this time her respiratory system and oral airway is stable.  Patient will get observed until 8:00.  Final Clinical Impressions(s) / ED Diagnoses   Final diagnoses:  Urticaria  Allergic reaction, initial encounter  Anaphylaxis, initial encounter    ED Discharge Orders         Ordered    diphenhydrAMINE (BENADRYL) 25 mg capsule   Every 6 hours PRN     12/18/18 0726    predniSONE (DELTASONE) 10 MG tablet  Daily     12/18/18 0726           Varney Biles, MD 12/18/18 0730

## 2018-12-18 NOTE — ED Notes (Signed)
Patient started Plavixx one week ago, stopped the [place].

## 2018-12-18 NOTE — ED Triage Notes (Signed)
Patient here with hives all over torso, arms, legs and she is itching everywhere.  She denies any changes in deodorants or detergents.  No shortness of breath, no throat complaints, no swelling of tongue.  She is CAOx4.  She states she tried oatmeal baths this evening with no results.

## 2018-12-18 NOTE — ED Notes (Signed)
Onset of itchy rash around midnight after getting out the bathtub (used oatmeal bath). Started plavix one week ago. No meds PTA.

## 2019-01-20 ENCOUNTER — Telehealth: Payer: Self-pay

## 2019-01-20 NOTE — Telephone Encounter (Signed)
COMPLETED AND SIGNED DOT FORM AND PRINT OUT OF LAST APPT 11/18/2018 NOTES WERE GIVEN TO PT IN-HAND ON 01/20/2019

## 2019-02-02 ENCOUNTER — Other Ambulatory Visit: Payer: Self-pay | Admitting: Cardiovascular Disease

## 2019-03-27 ENCOUNTER — Other Ambulatory Visit: Payer: Self-pay | Admitting: Family Medicine

## 2019-03-27 DIAGNOSIS — R928 Other abnormal and inconclusive findings on diagnostic imaging of breast: Secondary | ICD-10-CM

## 2019-04-01 ENCOUNTER — Other Ambulatory Visit: Payer: Self-pay | Admitting: Family Medicine

## 2019-04-01 DIAGNOSIS — N63 Unspecified lump in unspecified breast: Secondary | ICD-10-CM

## 2019-04-02 ENCOUNTER — Other Ambulatory Visit: Payer: Commercial Managed Care - PPO

## 2019-04-03 ENCOUNTER — Ambulatory Visit
Admission: RE | Admit: 2019-04-03 | Discharge: 2019-04-03 | Disposition: A | Discharge status: Home / Self Care | Payer: Commercial Managed Care - PPO | Source: Ambulatory Visit | Attending: Family Medicine | Admitting: Family Medicine

## 2019-04-03 ENCOUNTER — Other Ambulatory Visit: Payer: Self-pay

## 2019-04-03 ENCOUNTER — Other Ambulatory Visit: Payer: Commercial Managed Care - PPO

## 2019-04-03 ENCOUNTER — Other Ambulatory Visit: Payer: Self-pay | Admitting: Family Medicine

## 2019-04-03 DIAGNOSIS — R921 Mammographic calcification found on diagnostic imaging of breast: Secondary | ICD-10-CM

## 2019-04-03 DIAGNOSIS — N63 Unspecified lump in unspecified breast: Secondary | ICD-10-CM

## 2019-04-03 DIAGNOSIS — R928 Other abnormal and inconclusive findings on diagnostic imaging of breast: Secondary | ICD-10-CM

## 2019-04-06 ENCOUNTER — Ambulatory Visit
Admission: RE | Admit: 2019-04-06 | Discharge: 2019-04-06 | Disposition: A | Discharge status: Home / Self Care | Payer: Commercial Managed Care - PPO | Source: Ambulatory Visit | Attending: Family Medicine | Admitting: Family Medicine

## 2019-04-06 ENCOUNTER — Other Ambulatory Visit: Payer: Self-pay

## 2019-04-06 DIAGNOSIS — R921 Mammographic calcification found on diagnostic imaging of breast: Secondary | ICD-10-CM

## 2019-07-13 IMAGING — CR DG CHEST 2V
2 series · 2 of 2 positions shown · non-contrast
Comparison: Prior radiograph from 03/25/2018

CLINICAL DATA: Initial evaluation for acute chest pain, shortness
of breath, cough.

EXAM:
CHEST - 2 VIEW

[chest pa]
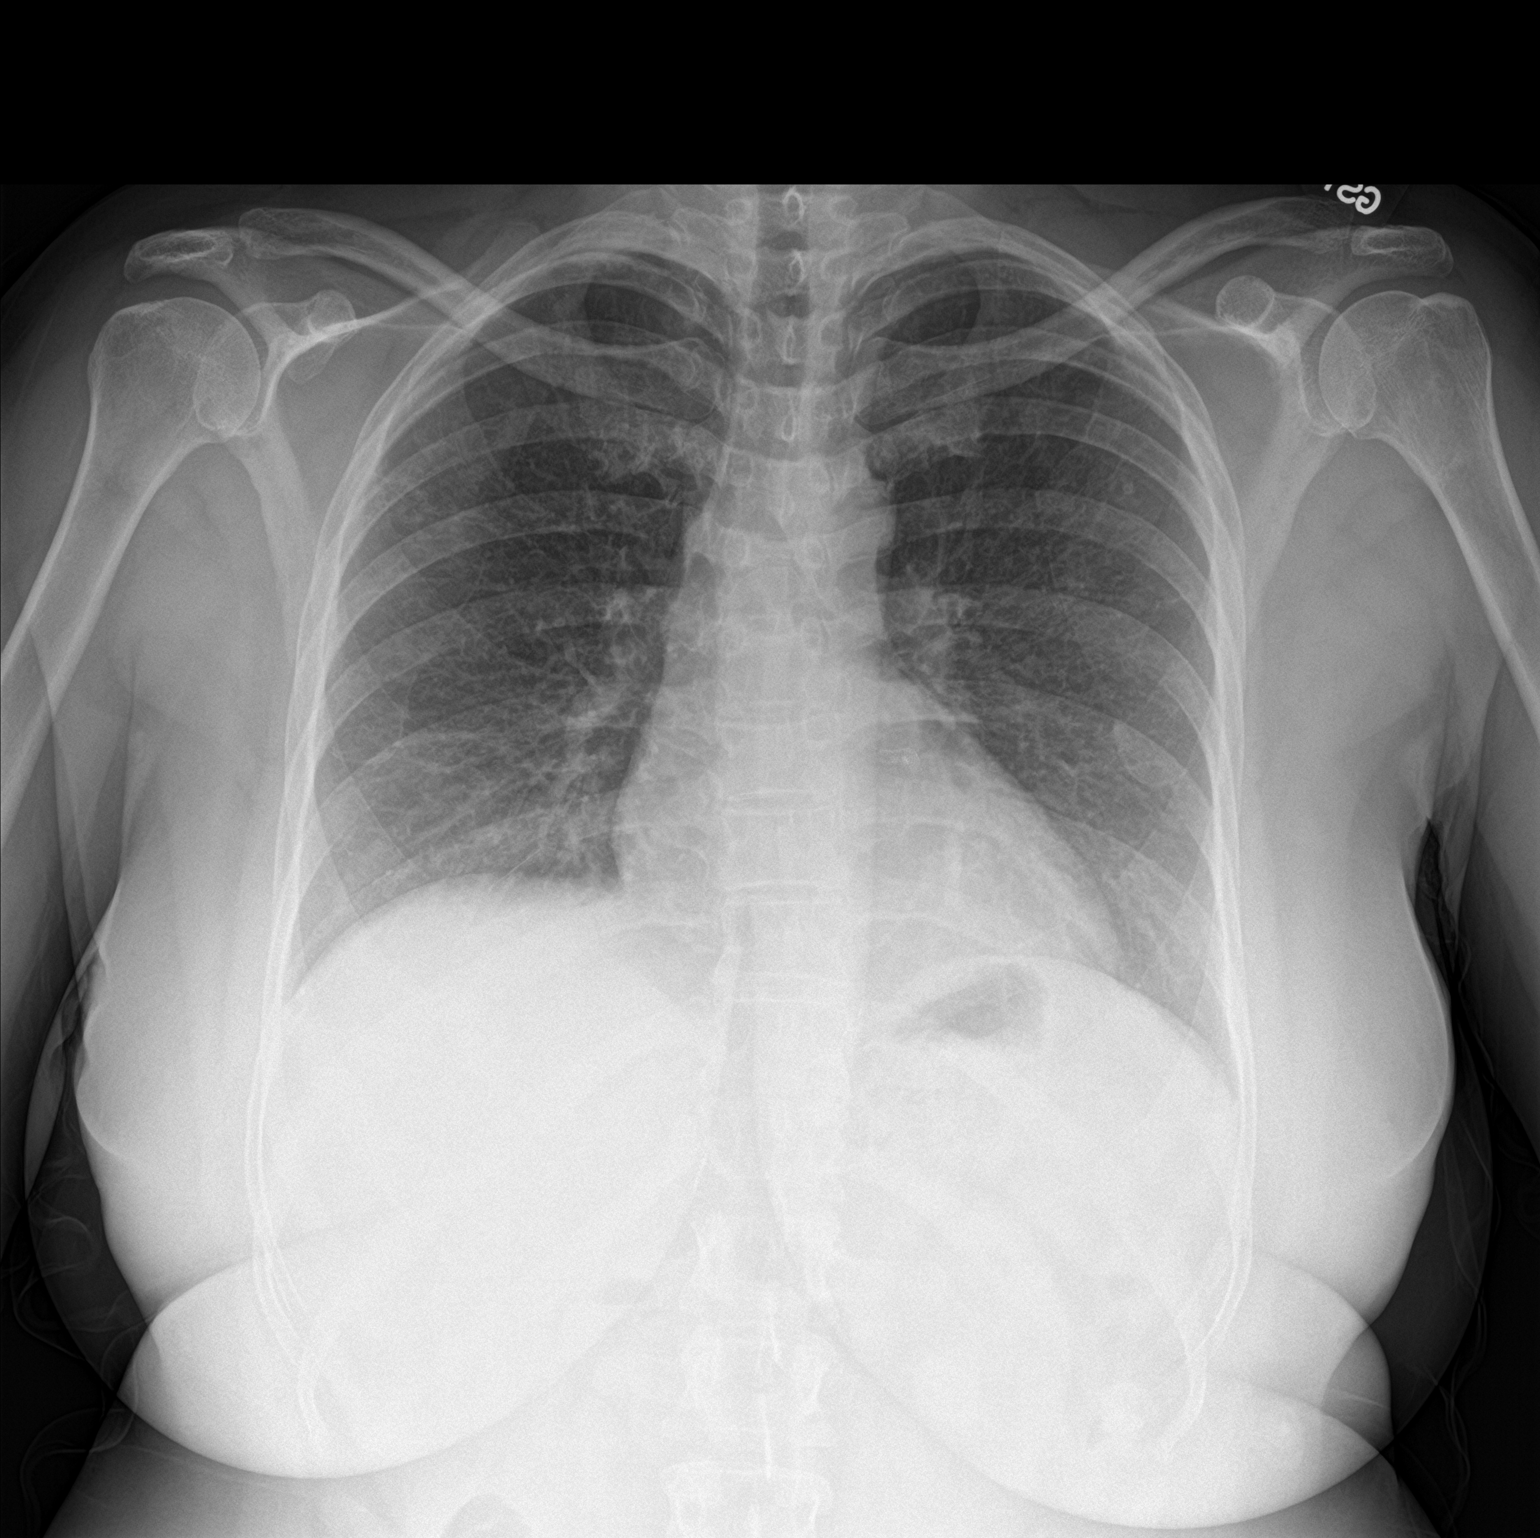

[chest lat]
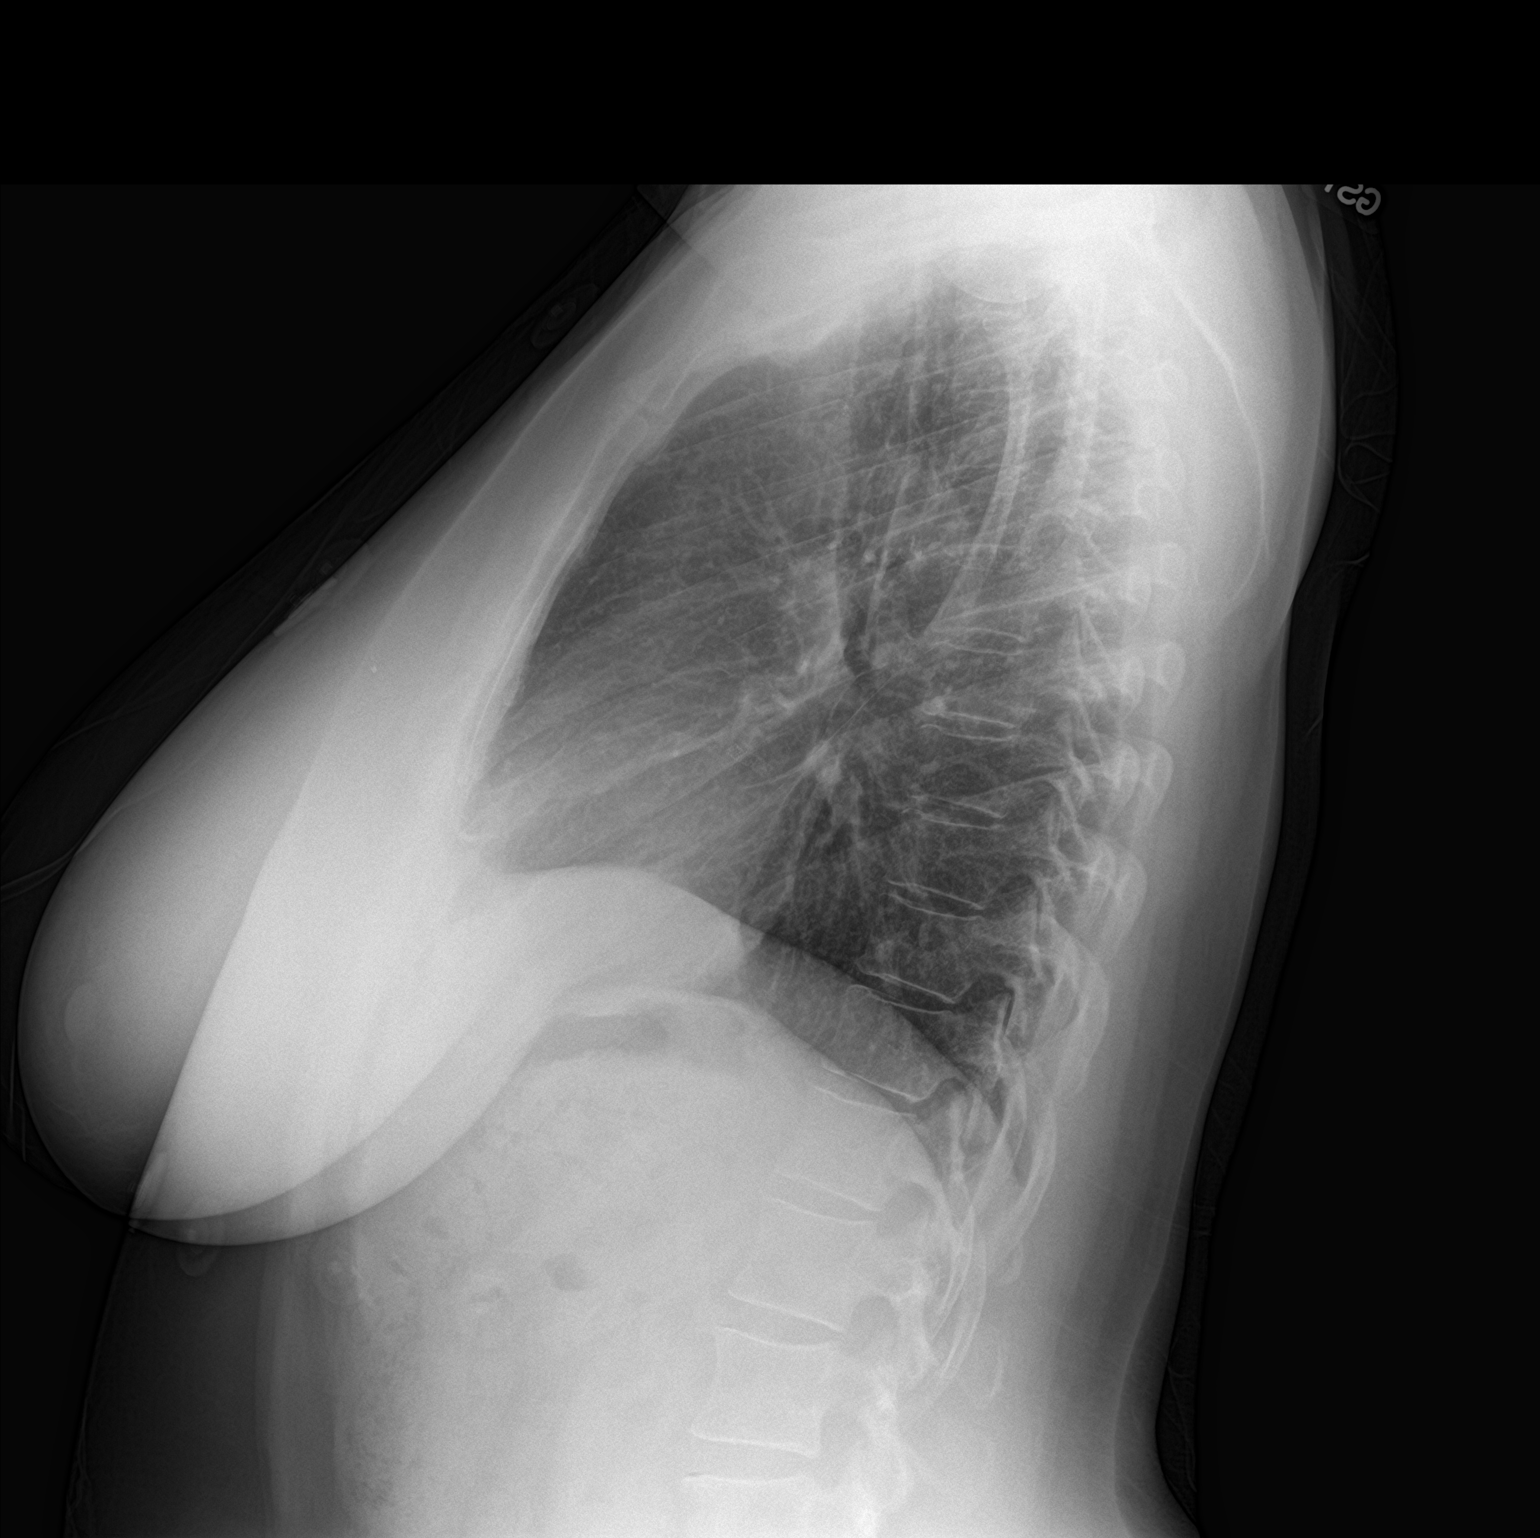

[2 of 2 positions shown; findings below may reference images not displayed]

FINDINGS: Cardiac and mediastinal silhouettes are within normal limits.

Lungs normally inflated. Mild scattered bronchitic changes. Slightly
more focal area of streaky density within the peripheral right upper
lobe is stable from previous, likely chronic. No consolidative
opacity identified. No edema or effusion. No pneumothorax.

No acute osseous abnormality.
IMPRESSION: 1. Mild scattered diffuse peribronchial thickening, which could
reflect underlying bronchiolitis. No consolidative opacity to
suggest pneumonia.
2. No other active cardiopulmonary disease.

## 2019-12-04 ENCOUNTER — Encounter: Payer: Self-pay | Admitting: Cardiovascular Disease

## 2019-12-04 ENCOUNTER — Ambulatory Visit (INDEPENDENT_AMBULATORY_CARE_PROVIDER_SITE_OTHER): Payer: Commercial Managed Care - PPO | Admitting: Cardiovascular Disease

## 2019-12-04 ENCOUNTER — Other Ambulatory Visit: Payer: Self-pay

## 2019-12-04 VITALS — BP 134/82 | HR 77 | Ht 64.5 in | Wt 197.2 lb

## 2019-12-04 DIAGNOSIS — I2510 Atherosclerotic heart disease of native coronary artery without angina pectoris: Secondary | ICD-10-CM | POA: Diagnosis not present

## 2019-12-04 DIAGNOSIS — E785 Hyperlipidemia, unspecified: Secondary | ICD-10-CM

## 2019-12-04 DIAGNOSIS — Z79899 Other long term (current) drug therapy: Secondary | ICD-10-CM

## 2019-12-04 DIAGNOSIS — Z720 Tobacco use: Secondary | ICD-10-CM | POA: Diagnosis not present

## 2019-12-04 LAB — HEPATIC FUNCTION PANEL
ALT: 22 IU/L (ref 0–32)
AST: 23 IU/L (ref 0–40)
Albumin: 4 g/dL (ref 3.8–4.9)
Alkaline Phosphatase: 142 IU/L — ABNORMAL HIGH (ref 48–121)
Bilirubin Total: 0.4 mg/dL (ref 0.0–1.2)
Bilirubin, Direct: 0.11 mg/dL (ref 0.00–0.40)
Total Protein: 6.5 g/dL (ref 6.0–8.5)

## 2019-12-04 LAB — LIPID PANEL
Chol/HDL Ratio: 5 ratio — ABNORMAL HIGH (ref 0.0–4.4)
Cholesterol, Total: 184 mg/dL (ref 100–199)
HDL: 37 mg/dL — ABNORMAL LOW (ref >39)
LDL Chol Calc (NIH): 125 mg/dL — ABNORMAL HIGH (ref 0–99)
Triglycerides: 121 mg/dL (ref 0–149)
VLDL Cholesterol Cal: 22 mg/dL (ref 5–40)

## 2019-12-04 MED ORDER — ASPIRIN EC 81 MG PO TBEC: 81.0000 mg | DELAYED_RELEASE_TABLET | Freq: Every day | ORAL | Status: AC

## 2019-12-04 NOTE — Progress Notes (Signed)
12/04/2019 [name]   1966-03-14  [id number]  Primary Physician Forrest Moron, MD Primary Cardiologist: Lorretta Harp MD Garret Reddish, [place], Martin  HPI:  [name] is a 54 y.o.  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 She is being seen by me for a post hospital visit after a non-STEMI and occurred on 06/30/17 status post PCI and drug-eluting stenting of the proximal AV groove circumflex. She did have a 70% proximal LAD lesion which did I did not think was physiologically significant. 2-D echo performed 07/01/17 showed normal LV function. She does have a history of sarcoidosisas well. She has long history of tobacco abuse having smoked a pack a day for 30 years currently smoking 4 cigarettes a day. She continues to have atypical constant chest pain.  She had a routine GXT which was abnormal and subsequent Myoview stress test that showed inferolateral scar without ischemia was likely from her circumflex infarct.  She did have a repeat cardiac catheterization performed 12/23/2017 because of chest pain that showed patent stents with otherwise normal coronary arteries.She has somewhat reduce her tobacco intake down to several cigarettes a day.  I told her that she can go back to work.  Since I saw her via virtual telemedicine visit a year ago she has seen a cardiologist at [facility] who stopped her Plavix.  For some reason she is on 3 baby aspirin.  She is gone back to smoking 1 cigarette a day.  She continues to drive a city bus.  She does get occasional atypical musculoskeletal pain which has not changed in frequency or severity.  Current Meds  Medication Sig  . aspirin EC 81 MG tablet Take 243 mg by mouth daily. Swallow whole.  . pantoprazole (PROTONIX) 40 MG  tablet Take 40 mg by mouth daily.     Allergies  Allergen Reactions  . Hydrocodone Hives and Itching    Social History   Socioeconomic History  . Marital status: Widowed    Spouse name: Not on file  . Number of children: 2  . Years of education: Not on file  . Highest education level: Not on file  Occupational History  . Occupation: Chiropodist: Doctor, general practice  Tobacco Use  . Smoking status: Former Smoker    Packs/day: 1.00    Years: 29.00    Pack years: 29.00    Types: Cigarettes    Quit date: 03/19/2018    Years since quitting: 1.7  . Smokeless tobacco: Never Used  Vaping Use  . Vaping Use: Never used  Substance and Sexual Activity  . Alcohol use: Yes    Comment: 12/20/2017 "couple drinks once/yr"  . Drug use: Never  . Sexual activity: Not on file  Other Topics Concern  . Not on file  Social History Narrative  . Not on file   Social Determinants of Health   Financial Resource Strain:   . Difficulty of Paying Living Expenses:   Food Insecurity:   . Worried About Charity fundraiser in the Last Year:   . Arboriculturist in the Last Year:   Transportation Needs:   . Film/video editor (Medical):   Marland Kitchen Lack of Transportation (Non-Medical):   Physical Activity:   .  Days of Exercise per Week:   . Minutes of Exercise per Session:   Stress:   . Feeling of Stress :   Social Connections:   . Frequency of Communication with Friends and Family:   . Frequency of Social Gatherings with Friends and Family:   . Attends Religious Services:   . Active Member of Clubs or Organizations:   . Attends Archivist Meetings:   Marland Kitchen Marital Status:   Intimate Partner Violence:   . Fear of Current or Ex-Partner:   . Emotionally Abused:   Marland Kitchen Physically Abused:   . Sexually Abused:      Review of Systems: General: negative for chills, fever, night sweats or weight changes.  Cardiovascular: negative for chest pain, dyspnea on exertion,  edema, orthopnea, palpitations, paroxysmal nocturnal dyspnea or shortness of breath Dermatological: negative for rash Respiratory: negative for cough or wheezing Urologic: negative for hematuria Abdominal: negative for nausea, vomiting, diarrhea, bright red blood per rectum, melena, or hematemesis Neurologic: negative for visual changes, syncope, or dizziness All other systems reviewed and are otherwise negative except as noted above.    Blood pressure 134/82, pulse 77, height 5' 4.5" (1.638 m), weight 197 lb 3.2 oz (89.4 kg).  General appearance: alert and no distress Neck: no adenopathy, no carotid bruit, no JVD, supple, symmetrical, trachea midline and thyroid not enlarged, symmetric, no tenderness/mass/nodules Lungs: clear to auscultation bilaterally Heart: regular rate and rhythm, S1, S2 normal, no murmur, click, rub or gallop Extremities: extremities normal, atraumatic, no cyanosis or edema Pulses: 2+ and symmetric Skin: Skin color, texture, turgor normal. No rashes or lesions Neurologic: Alert and oriented X 3, normal strength and tone. Normal symmetric reflexes. Normal coordination and gait  EKG normal sinus rhythm at 77 with septal Q waves and low limb voltage.  I personally reviewed this EKG.  ASSESSMENT AND PLAN:   CAD (coronary artery disease) History of CAD status post non-STEMI 06/30/2017 with PCI and drug-eluting stenting of the proximal AV groove circumflex.  She did have a 70% proximal LAD lesion which I did not think was physiologically significant and normal LV function.  Because of atypical chest pain subsequent Myoview stress test showed inferolateral scar without ischemia attributable to her prior circumflex infarct.  She did have a repeat cardiac catheterization performed 12/23/2017 that showed patent stent with otherwise unchanged anatomy.  She continues to have occasional atypical chest pain which sounds musculoskeletal.  Hyperlipidemia with target LDL less than  70 History of hyperlipidemia not on statin therapy with lipid profile performed 12/02/2017 revealed total cholesterol 112, LDL 54 and HDL of 37.  We will repeat a lipid liver profile.  Tobacco abuse Continue tobacco abuse of 1 cigarette a day.      Lorretta Harp MD FACP,FACC,FAHA, [facility] 12/04/2019 10:33 AM

## 2019-12-04 NOTE — Patient Instructions (Signed)
Medication Instructions:  Decrease Aspirin to 81 mg daily   *If you need a refill on your cardiac medications before your next appointment, please call your pharmacy*   Lab Work: Your physician recommends that you return for lab work today ( fasting lipid, LFT)  If you have labs (blood work) drawn today and your tests are completely normal, you will receive your results only by: Marland Kitchen MyChart Message (if you have MyChart) OR . A paper copy in the mail If you have any lab test that is abnormal or we need to change your treatment, we will call you to review the results.   Testing/Procedures: None   Follow-Up: At [facility], you and your health needs are our priority.  As part of our continuing mission to provide you with exceptional heart care, we have created designated Provider Care Teams.  These Care Teams include your primary Cardiologist (physician) and Advanced Practice Providers (APPs -  Physician Assistants and Nurse Practitioners) who all work together to provide you with the care you need, when you need it.  We recommend signing up for the patient portal called "MyChart".  Sign up information is provided on this After Visit Summary.  MyChart is used to connect with patients for Virtual Visits (Telemedicine).  Patients are able to view lab/test results, encounter notes, upcoming appointments, etc.  Non-urgent messages can be sent to your provider as well.   To learn more about what you can do with MyChart, go to [email or web address].    Your next appointment:   1 year(s)  The format for your next appointment:   In Person  Provider:   Quay Burow, MD

## 2019-12-04 NOTE — Assessment & Plan Note (Signed)
Continue tobacco abuse of 1 cigarette a day.

## 2019-12-04 NOTE — Assessment & Plan Note (Signed)
History of CAD status post non-STEMI 06/30/2017 with PCI and drug-eluting stenting of the proximal AV groove circumflex.  She did have a 70% proximal LAD lesion which I did not think was physiologically significant and normal LV function.  Because of atypical chest pain subsequent Myoview stress test showed inferolateral scar without ischemia attributable to her prior circumflex infarct.  She did have a repeat cardiac catheterization performed 12/23/2017 that showed patent stent with otherwise unchanged anatomy.  She continues to have occasional atypical chest pain which sounds musculoskeletal.

## 2019-12-04 NOTE — Assessment & Plan Note (Signed)
History of hyperlipidemia not on statin therapy with lipid profile performed 12/02/2017 revealed total cholesterol 112, LDL 54 and HDL of 37.  We will repeat a lipid liver profile.

## 2019-12-11 ENCOUNTER — Other Ambulatory Visit: Payer: Self-pay

## 2019-12-11 DIAGNOSIS — E785 Hyperlipidemia, unspecified: Secondary | ICD-10-CM

## 2019-12-11 DIAGNOSIS — Z79899 Other long term (current) drug therapy: Secondary | ICD-10-CM

## 2019-12-11 MED ORDER — ATORVASTATIN CALCIUM 20 MG PO TABS: 20.0000 mg | ORAL_TABLET | Freq: Every day | ORAL | 6 refills | Status: DC

## 2020-04-13 ENCOUNTER — Institutional Professional Consult (permissible substitution): Payer: Commercial Managed Care - PPO | Admitting: Plastic Surgery

## 2020-05-19 ENCOUNTER — Institutional Professional Consult (permissible substitution): Payer: Commercial Managed Care - PPO | Admitting: Plastic Surgery

## 2020-06-15 ENCOUNTER — Other Ambulatory Visit: Payer: Self-pay | Admitting: Cardiovascular Disease

## 2020-06-16 ENCOUNTER — Institutional Professional Consult (permissible substitution): Payer: Commercial Managed Care - PPO | Admitting: Plastic Surgery

## 2020-07-21 ENCOUNTER — Encounter: Payer: Self-pay | Admitting: Plastic Surgery

## 2020-07-21 ENCOUNTER — Ambulatory Visit (INDEPENDENT_AMBULATORY_CARE_PROVIDER_SITE_OTHER): Payer: Commercial Managed Care - PPO | Admitting: Plastic Surgery

## 2020-07-21 ENCOUNTER — Other Ambulatory Visit: Payer: Self-pay

## 2020-07-21 VITALS — BP 144/90 | HR 47 | Ht 64.5 in | Wt 196.8 lb

## 2020-07-21 DIAGNOSIS — M4004 Postural kyphosis, thoracic region: Secondary | ICD-10-CM

## 2020-07-21 DIAGNOSIS — N62 Hypertrophy of breast: Secondary | ICD-10-CM | POA: Diagnosis not present

## 2020-07-21 DIAGNOSIS — M546 Pain in thoracic spine: Secondary | ICD-10-CM | POA: Diagnosis not present

## 2020-07-21 DIAGNOSIS — M545 Low back pain, unspecified: Secondary | ICD-10-CM

## 2020-07-21 NOTE — Progress Notes (Signed)
Referring Provider Forrest Moron, MD [phone] [place],  [place] [address]  Patient presents CC:  Chief Complaint  Patient presents with  . Advice Only      [name] is an 55 y.o. female.  HPI: Patient presents to discuss breast reduction.  She is had years of back pain, neck pain and shoulder grooving related to her large breasts.  She is tried over-the-counter medications, warm packs, cold packs and supportive garments with little relief.  She is currently 40 triple D and wants to be around a C cup.  She also gets rashes beneath her breast that have been refractory to over-the-counter treatments.  She has a maternal grandmother that had breast cancer and she herself has had biopsies on the left breast 2 years ago that were negative for cancer.  She is due for mammogram and her last one was a little over a year ago.  She does smoke a half a pack a day but is not a diabetic.  Also of note she had a heart attack in 2019 that required a stent but she has not had any issues since.  Allergies  Allergen Reactions  . Hydrocodone Hives and Itching    Outpatient Encounter Medications as of 07/21/2020  Medication Sig  . aspirin EC 81 MG tablet Take 1 tablet (81 mg total) by mouth daily. Swallow whole.  Marland Kitchen atorvastatin (LIPITOR) 20 MG tablet TAKE 1 TABLET BY MOUTH EVERY DAY  . pantoprazole (PROTONIX) 40 MG tablet Take 40 mg by mouth daily. (Patient not taking: Reported on 07/21/2020)   No facility-administered encounter medications on file as of 07/21/2020.     Past Medical History:  Diagnosis Date  . Anemia   . Fibroid   . Hyperlipidemia   . NSTEMI (non-ST elevated myocardial infarction) ([place]) 06/2017   s/p DES to LCX 07/02/17  . Sarcoidosis     Past Surgical History:  Procedure Laterality Date  . ABDOMINAL HYSTERECTOMY    . BREAST BIOPSY Right 03/24/2018  . CESAREAN SECTION  1987  . CORONARY/GRAFT ACUTE MI REVASCULARIZATION N/A 06/30/2017    Procedure: Coronary/Graft Acute MI Revascularization;  Surgeon: Lorretta Harp, MD;  Location: [facility];  Service: Cardiovascular;  Laterality: N/A;  . FOREARM FRACTURE SURGERY Right 1977  . FRACTURE SURGERY    . LEFT HEART CATH AND CORONARY ANGIOGRAPHY N/A 06/30/2017   Procedure: LEFT HEART CATH AND CORONARY ANGIOGRAPHY;  Surgeon: Lorretta Harp, MD;  Location: [facility];  Service: Cardiovascular;  Laterality: N/A;  . LEFT HEART CATH AND CORONARY ANGIOGRAPHY N/A 12/23/2017   Procedure: LEFT HEART CATH AND CORONARY ANGIOGRAPHY;  Surgeon: Lorretta Harp, MD;  Location: [facility];  Service: Cardiovascular;  Laterality: N/A;  . TUBAL LIGATION  1990    Family History  Problem Relation Age of Onset  . Diabetes Mother   . Stroke Mother 90  . Hypertension Mother   . Hyperlipidemia Father   . Stroke Father 55  . Hypertension Father   . Breast cancer Paternal Grandmother     Social History   Social History Narrative  . Not on file     Review of Systems General: Denies fevers, chills, weight loss CV: Denies chest pain, shortness of breath, palpitations  Physical Exam Vitals with BMI 07/21/2020 12/04/2019 12/18/2018  Height 5' 4.5" 5' 4.5" -  Weight 196 lbs 13 oz 197 lbs 3 oz -  BMI 33.27 33.34 -  Systolic 829 562 130  Diastolic 90 82 75  Pulse 47 77 102    General:  No acute distress,  Alert and oriented, Non-Toxic, Normal speech and affect Breast: She has grade 3 ptosis.  Sternal notch to nipple is 34 cm on the right and 35 cm on the left.  Nipple to fold is 18 cm on the right and 17 cm on the left.  I do not see any obvious scars or masses.  She pointed out the area of her previous biopsies and I do not feel any masses in that area.  Assessment/Plan The patient has bilateral symptomatic macromastia.  She is a good candidate for a breast reduction.  She is interested in pursuing surgical treatment.  She has tried supportive garments and fitted bras  with no relief.  The details of breast reduction surgery were discussed.  I explained the procedure in detail along the with the expected scars.  The risks were discussed in detail and include bleeding, infection, damage to surrounding structures, need for additional procedures, nipple loss, change in nipple sensation, persistent pain, contour irregularities and asymmetries.  I explained that breast feeding is often not possible after breast reduction surgery.  We discussed the expected postoperative course with an overall recovery period of about 1 month.  She demonstrated full understanding of all risks.  We discussed her personal risk factors that include smoking.  I explained she would need to stop smoking to be a candidate for the procedure.  We will plan to test her a couple weeks ahead of time.  She needs to stop smoking at least 4 and preferably 6 weeks prior to surgery.  We will additionally see clearance from her cardiologist for general anesthesia.  I anticipate approximately 750g of tissue removed from each side.   Cindra Presume 07/21/2020, 11:21 AM

## 2020-08-25 ENCOUNTER — Telehealth: Payer: Self-pay

## 2020-08-25 NOTE — Telephone Encounter (Signed)
   Primary Cardiologist: Quay Burow, MD  Chart reviewed as part of pre-operative protocol coverage. Patient was contacted 08/25/2020 in reference to pre-operative risk assessment for pending surgery as outlined below.  [name] was last seen on 12/04/19 by Dr. Gwenlyn Found.  Since that day, [name] has done well from a cardiac standpoint. She uses an elliptical machine 30 minutes per day. She can complete 4 METs without anginal complaints.  Therefore, based on ACC/AHA guidelines, the patient would be at acceptable risk for the planned procedure without further cardiovascular testing.   The patient was advised that if she develops new symptoms prior to surgery to contact our office to arrange for a follow-up visit, and she verbalized understanding.  I will route this recommendation to the requesting party via Epic fax function and remove from pre-op pool. Please call with questions.  Abigail Butts, PA-C 08/25/2020, 4:26 PM

## 2020-08-25 NOTE — Telephone Encounter (Signed)
   [facility] Pre-operative Risk Assessment    HEARTCARE STAFF: - Please ensure there is not already an duplicate clearance open for this procedure. - Under Visit Info/Reason for Call, type in Other and utilize the format Clearance MM/DD/YY or Clearance TBD. Do not use dashes or single digits. - If request is for dental extraction, please clarify the # of teeth to be extracted.  Request for surgical clearance:  1. What type of surgery is being performed? Removal of Left Breast Expander and placement of implant   2. When is this surgery scheduled? Oct 17, 2020   3. What type of clearance is required (medical clearance vs. Pharmacy clearance to hold med vs. Both)? Medical  4. Are there any medications that need to be held prior to surgery and how long?Not Indicated   5. Practice name and name of physician performing surgery? [facility] Dr. Mingo Amber   6. What is the office phone number? [phone]   7.   What is the office fax number? [phone]  8.   Anesthesia type (None, local, MAC, general) ? General   Monia Pouch 08/25/2020, 4:04 PM  _________________________________________________________________   (provider comments below)

## 2020-08-26 DIAGNOSIS — Z719 Counseling, unspecified: Secondary | ICD-10-CM

## 2020-08-30 ENCOUNTER — Encounter: Payer: Self-pay | Admitting: Surgical

## 2020-09-06 ENCOUNTER — Other Ambulatory Visit: Payer: Self-pay | Admitting: Cardiovascular Disease

## 2020-09-27 ENCOUNTER — Encounter: Payer: Self-pay | Admitting: Surgical

## 2020-09-27 ENCOUNTER — Ambulatory Visit (INDEPENDENT_AMBULATORY_CARE_PROVIDER_SITE_OTHER): Payer: Commercial Managed Care - PPO | Admitting: Surgical

## 2020-09-27 ENCOUNTER — Other Ambulatory Visit: Payer: Self-pay

## 2020-09-27 VITALS — BP 148/88 | HR 87 | Ht 64.0 in | Wt 197.0 lb

## 2020-09-27 DIAGNOSIS — M4004 Postural kyphosis, thoracic region: Secondary | ICD-10-CM

## 2020-09-27 DIAGNOSIS — M545 Low back pain, unspecified: Secondary | ICD-10-CM

## 2020-09-27 DIAGNOSIS — N62 Hypertrophy of breast: Secondary | ICD-10-CM

## 2020-09-27 DIAGNOSIS — M546 Pain in thoracic spine: Secondary | ICD-10-CM

## 2020-09-27 MED ORDER — OXYCODONE-ACETAMINOPHEN 5-325 MG PO TABS: 1.0000 | ORAL_TABLET | Freq: Four times a day (QID) | ORAL | 0 refills | Status: AC | PRN

## 2020-09-27 MED ORDER — ONDANSETRON HCL 4 MG PO TABS: 4.0000 mg | ORAL_TABLET | Freq: Three times a day (TID) | ORAL | 0 refills | Status: DC | PRN

## 2020-09-27 NOTE — H&P (View-Only) (Signed)
Patient ID: [name], female    DOB: 1965-07-28, 55 y.o.   MRN: [id number]  Chief Complaint  Patient presents with  . Pre-op Exam      ICD-10-CM   1. Macromastia  N62 Nicotine/cotinine metabolites  2. Back pain of thoracolumbar region  M54.50    M54.6   3. Postural kyphosis, thoracic region  M40.04      History of Present Illness: [name] is a 55 y.o.  female  with a history of macromastia.  She presents for preoperative evaluation for upcoming procedure, Bilateral Breast Reduction, scheduled for 10/17/2020 with Dr.  Claudia Desanctis  The patient has not had problems with anesthesia. No history of DVT/PE.  No family history of DVT/PE.  No family or personal history of bleeding or clotting disorders.  Patient is currently on aspirin 81 mg.  No history of CVA/MI.   Summary of Previous Visit: She is currently a 40 triple D.  She is due for mammogram.  She does smoke half pack a day but is not a diabetic.  Estimated excess breast tissue to be removed at time of surgery: 750 on the left and 750 on the right. Patient reports today she would like to be a B/C cup, as small as possible.  PMH Significant for: MI in 2019 status postplacement of cardiac stent.  Sarcoidosis.  Hyperlipidemia   Past Medical History: Allergies: Allergies  Allergen Reactions  . Hydrocodone Hives and Itching    Current Medications:  Current Outpatient Medications:  .  aspirin EC 81 MG tablet, Take 1 tablet (81 mg total) by mouth daily. Swallow whole., Disp: 30 tablet, Rfl:  .  atorvastatin (LIPITOR) 20 MG tablet, TAKE 1 TABLET BY MOUTH EVERY DAY, Disp: 90 tablet, Rfl: 1 .  pantoprazole (PROTONIX) 40 MG tablet, Take 40 mg by mouth daily., Disp: , Rfl:   Past Medical Problems: Past Medical History:  Diagnosis Date  . Anemia   . Fibroid   . Hyperlipidemia   . NSTEMI (non-ST elevated myocardial infarction) ([place]) 06/2017   s/p DES to LCX 07/02/17  . Sarcoidosis     Past Surgical History: Past  Surgical History:  Procedure Laterality Date  . ABDOMINAL HYSTERECTOMY    . BREAST BIOPSY Right 03/24/2018  . CESAREAN SECTION  1987  . CORONARY/GRAFT ACUTE MI REVASCULARIZATION N/A 06/30/2017   Procedure: Coronary/Graft Acute MI Revascularization;  Surgeon: Lorretta Harp, MD;  Location: [facility];  Service: Cardiovascular;  Laterality: N/A;  . FOREARM FRACTURE SURGERY Right 1977  . FRACTURE SURGERY    . LEFT HEART CATH AND CORONARY ANGIOGRAPHY N/A 06/30/2017   Procedure: LEFT HEART CATH AND CORONARY ANGIOGRAPHY;  Surgeon: Lorretta Harp, MD;  Location: [facility];  Service: Cardiovascular;  Laterality: N/A;  . LEFT HEART CATH AND CORONARY ANGIOGRAPHY N/A 12/23/2017   Procedure: LEFT HEART CATH AND CORONARY ANGIOGRAPHY;  Surgeon: Lorretta Harp, MD;  Location: [facility];  Service: Cardiovascular;  Laterality: N/A;  . TUBAL LIGATION  1990    Social History: Social History   Socioeconomic History  . Marital status: Widowed    Spouse name: Not on file  . Number of children: 2  . Years of education: Not on file  . Highest education level: Not on file  Occupational History  . Occupation: Chiropodist: Doctor, general practice  Tobacco Use  . Smoking status: Former Smoker    Packs/day: 1.00  Years: 29.00    Pack years: 29.00    Types: Cigarettes    Quit date: 03/19/2018    Years since quitting: 2.5  . Smokeless tobacco: Never Used  Vaping Use  . Vaping Use: Never used  Substance and Sexual Activity  . Alcohol use: Yes    Comment: 12/20/2017 "couple drinks once/yr"  . Drug use: Never  . Sexual activity: Not on file  Other Topics Concern  . Not on file  Social History Narrative  . Not on file   Social Determinants of Health   Financial Resource Strain: Not on file  Food Insecurity: Not on file  Transportation Needs: Not on file  Physical Activity: Not on file  Stress: Not on file  Social Connections: Not on file   Intimate Partner Violence: Not on file    Family History: Family History  Problem Relation Age of Onset  . Diabetes Mother   . Stroke Mother 19  . Hypertension Mother   . Hyperlipidemia Father   . Stroke Father 76  . Hypertension Father   . Breast cancer Paternal Grandmother     Review of Systems: Review of Systems  Constitutional: Negative.   Respiratory: Negative.   Cardiovascular: Negative.   Gastrointestinal: Negative.   Neurological: Negative.     Physical Exam: Vital Signs BP (!) 148/88 (BP Location: Left Arm, Patient Position: Sitting, Cuff Size: Large)   Pulse 87   Ht 5\' 4"  (1.626 m)   Wt 197 lb (89.4 kg)   SpO2 100%   BMI 33.81 kg/m   Physical Exam Constitutional:      General: Not in acute distress.    Appearance: Normal appearance. Not ill-appearing.  HENT:     Head: Normocephalic and atraumatic.  Eyes:     Pupils: Pupils are equal, round Neck:     Musculoskeletal: Normal range of motion.  Cardiovascular:     Rate and Rhythm: Normal rate    Pulses: Normal pulses.  Pulmonary:     Effort: Pulmonary effort is normal. No respiratory distress.  Abdominal:     General: Abdomen is flat. There is no distension.  Musculoskeletal: Normal range of motion.  Skin:    General: Skin is warm and dry.     Findings: No erythema or rash.  Neurological:     General: No focal deficit present.     Mental Status: Alert and oriented to person, place, and time. Mental status is at baseline.     Motor: No weakness.  Psychiatric:        Mood and Affect: Mood normal.        Behavior: Behavior normal.    Assessment/Plan: The patient is scheduled for bilateral breast reduction with Dr. Claudia Desanctis.  Risks, benefits, and alternatives of procedure discussed, questions answered and consent obtained.    Smoking Status: Quit 4 weeks ago; Counseling Given?  Yes  Last Mammogram: 2020, ordered mammogram today  Caprini Score: 4, moderate; Risk Factors include: Age, BMI greater  than 25, and length of planned surgery. Recommendation for mechanical and pharmacological prophylaxis while hospitalized. Encourage early ambulation.  Patient is currently on ASA 81 mg daily.  She has been cleared by cardiology, however it was not specified if patient can hold aspirin prior to surgery.  I discussed with the patient that we would need this held 5 days prior to surgery, however we will send additional clearance to cardiology to confirm they are comfortable with this  Pictures obtained: 07/21/2020  Patient provided with nicotine  test.  She is understanding that this will need to be negative prior to any surgical intervention. Post-op Rx sent to pharmacy: Percocet, Zofran   Patient was provided with the breast reduction and General Surgical Risk consent document and Pain Medication Agreement prior to their appointment.  They had adequate time to read through the risk consent documents and Pain Medication Agreement. We also discussed them in person together during this preop appointment. All of their questions were answered to their satisfaction.  Recommended calling if they have any further questions.  Risk consent form and Pain Medication Agreement to be scanned into patient's chart.  The risk that can be encountered with breast reduction were discussed and include the following but not limited to these:  Breast asymmetry, fluid accumulation, firmness of the breast, inability to breast feed, loss of nipple or areola, skin loss, decrease or no nipple sensation, fat necrosis of the breast tissue, bleeding, infection, healing delay.  There are risks of anesthesia, changes to skin sensation and injury to nerves or blood vessels.  The muscle can be temporarily or permanently injured.  You may have an allergic reaction to tape, suture, glue, blood products which can result in skin discoloration, swelling, pain, skin lesions, poor healing.  Any of these can lead to the need for revisonal surgery or  stage procedures.  A reduction has potential to interfere with diagnostic procedures.  Nipple or breast piercing can increase risks of infection.  This procedure is best done when the breast is fully developed.  Changes in the breast will continue to occur over time.  Pregnancy can alter the outcomes of previous breast reduction surgery, weight gain and weigh loss can also effect the long term appearance.    Electronically signed by: Carola Rhine Meelah Tallo, PA-C 09/27/2020 2:37 PM

## 2020-09-27 NOTE — Progress Notes (Signed)
Patient ID: [name], female    DOB: 1965-12-30, 55 y.o.   MRN: [id number]  Chief Complaint  Patient presents with  . Pre-op Exam      ICD-10-CM   1. Macromastia  N62 Nicotine/cotinine metabolites  2. Back pain of thoracolumbar region  M54.50    M54.6   3. Postural kyphosis, thoracic region  M40.04      History of Present Illness: [name] is a 55 y.o.  female  with a history of macromastia.  She presents for preoperative evaluation for upcoming procedure, Bilateral Breast Reduction, scheduled for 10/17/2020 with Dr.  Claudia Desanctis  The patient has not had problems with anesthesia. No history of DVT/PE.  No family history of DVT/PE.  No family or personal history of bleeding or clotting disorders.  Patient is currently on aspirin 81 mg.  No history of CVA/MI.   Summary of Previous Visit: She is currently a 40 triple D.  She is due for mammogram.  She does smoke half pack a day but is not a diabetic.  Estimated excess breast tissue to be removed at time of surgery: 750 on the left and 750 on the right. Patient reports today she would like to be a B/C cup, as small as possible.  PMH Significant for: MI in 2019 status postplacement of cardiac stent.  Sarcoidosis.  Hyperlipidemia   Past Medical History: Allergies: Allergies  Allergen Reactions  . Hydrocodone Hives and Itching    Current Medications:  Current Outpatient Medications:  .  aspirin EC 81 MG tablet, Take 1 tablet (81 mg total) by mouth daily. Swallow whole., Disp: 30 tablet, Rfl:  .  atorvastatin (LIPITOR) 20 MG tablet, TAKE 1 TABLET BY MOUTH EVERY DAY, Disp: 90 tablet, Rfl: 1 .  pantoprazole (PROTONIX) 40 MG tablet, Take 40 mg by mouth daily., Disp: , Rfl:   Past Medical Problems: Past Medical History:  Diagnosis Date  . Anemia   . Fibroid   . Hyperlipidemia   . NSTEMI (non-ST elevated myocardial infarction) ([place]) 06/2017   s/p DES to LCX 07/02/17  . Sarcoidosis     Past Surgical History: Past  Surgical History:  Procedure Laterality Date  . ABDOMINAL HYSTERECTOMY    . BREAST BIOPSY Right 03/24/2018  . CESAREAN SECTION  1987  . CORONARY/GRAFT ACUTE MI REVASCULARIZATION N/A 06/30/2017   Procedure: Coronary/Graft Acute MI Revascularization;  Surgeon: Lorretta Harp, MD;  Location: [facility];  Service: Cardiovascular;  Laterality: N/A;  . FOREARM FRACTURE SURGERY Right 1977  . FRACTURE SURGERY    . LEFT HEART CATH AND CORONARY ANGIOGRAPHY N/A 06/30/2017   Procedure: LEFT HEART CATH AND CORONARY ANGIOGRAPHY;  Surgeon: Lorretta Harp, MD;  Location: [facility];  Service: Cardiovascular;  Laterality: N/A;  . LEFT HEART CATH AND CORONARY ANGIOGRAPHY N/A 12/23/2017   Procedure: LEFT HEART CATH AND CORONARY ANGIOGRAPHY;  Surgeon: Lorretta Harp, MD;  Location: [facility];  Service: Cardiovascular;  Laterality: N/A;  . TUBAL LIGATION  1990    Social History: Social History   Socioeconomic History  . Marital status: Widowed    Spouse name: Not on file  . Number of children: 2  . Years of education: Not on file  . Highest education level: Not on file  Occupational History  . Occupation: Chiropodist: Doctor, general practice  Tobacco Use  . Smoking status: Former Smoker    Packs/day: 1.00  Years: 29.00    Pack years: 29.00    Types: Cigarettes    Quit date: 03/19/2018    Years since quitting: 2.5  . Smokeless tobacco: Never Used  Vaping Use  . Vaping Use: Never used  Substance and Sexual Activity  . Alcohol use: Yes    Comment: 12/20/2017 "couple drinks once/yr"  . Drug use: Never  . Sexual activity: Not on file  Other Topics Concern  . Not on file  Social History Narrative  . Not on file   Social Determinants of Health   Financial Resource Strain: Not on file  Food Insecurity: Not on file  Transportation Needs: Not on file  Physical Activity: Not on file  Stress: Not on file  Social Connections: Not on file   Intimate Partner Violence: Not on file    Family History: Family History  Problem Relation Age of Onset  . Diabetes Mother   . Stroke Mother 41  . Hypertension Mother   . Hyperlipidemia Father   . Stroke Father 51  . Hypertension Father   . Breast cancer Paternal Grandmother     Review of Systems: Review of Systems  Constitutional: Negative.   Respiratory: Negative.   Cardiovascular: Negative.   Gastrointestinal: Negative.   Neurological: Negative.     Physical Exam: Vital Signs BP (!) 148/88 (BP Location: Left Arm, Patient Position: Sitting, Cuff Size: Large)   Pulse 87   Ht 5\' 4"  (1.626 m)   Wt 197 lb (89.4 kg)   SpO2 100%   BMI 33.81 kg/m   Physical Exam Constitutional:      General: Not in acute distress.    Appearance: Normal appearance. Not ill-appearing.  HENT:     Head: Normocephalic and atraumatic.  Eyes:     Pupils: Pupils are equal, round Neck:     Musculoskeletal: Normal range of motion.  Cardiovascular:     Rate and Rhythm: Normal rate    Pulses: Normal pulses.  Pulmonary:     Effort: Pulmonary effort is normal. No respiratory distress.  Abdominal:     General: Abdomen is flat. There is no distension.  Musculoskeletal: Normal range of motion.  Skin:    General: Skin is warm and dry.     Findings: No erythema or rash.  Neurological:     General: No focal deficit present.     Mental Status: Alert and oriented to person, place, and time. Mental status is at baseline.     Motor: No weakness.  Psychiatric:        Mood and Affect: Mood normal.        Behavior: Behavior normal.    Assessment/Plan: The patient is scheduled for bilateral breast reduction with Dr. Claudia Desanctis.  Risks, benefits, and alternatives of procedure discussed, questions answered and consent obtained.    Smoking Status: Quit 4 weeks ago; Counseling Given?  Yes  Last Mammogram: 2020, ordered mammogram today  Caprini Score: 4, moderate; Risk Factors include: Age, BMI greater  than 25, and length of planned surgery. Recommendation for mechanical and pharmacological prophylaxis while hospitalized. Encourage early ambulation.  Patient is currently on ASA 81 mg daily.  She has been cleared by cardiology, however it was not specified if patient can hold aspirin prior to surgery.  I discussed with the patient that we would need this held 5 days prior to surgery, however we will send additional clearance to cardiology to confirm they are comfortable with this  Pictures obtained: 07/21/2020  Patient provided with nicotine  test.  She is understanding that this will need to be negative prior to any surgical intervention. Post-op Rx sent to pharmacy: Percocet, Zofran   Patient was provided with the breast reduction and General Surgical Risk consent document and Pain Medication Agreement prior to their appointment.  They had adequate time to read through the risk consent documents and Pain Medication Agreement. We also discussed them in person together during this preop appointment. All of their questions were answered to their satisfaction.  Recommended calling if they have any further questions.  Risk consent form and Pain Medication Agreement to be scanned into patient's chart.  The risk that can be encountered with breast reduction were discussed and include the following but not limited to these:  Breast asymmetry, fluid accumulation, firmness of the breast, inability to breast feed, loss of nipple or areola, skin loss, decrease or no nipple sensation, fat necrosis of the breast tissue, bleeding, infection, healing delay.  There are risks of anesthesia, changes to skin sensation and injury to nerves or blood vessels.  The muscle can be temporarily or permanently injured.  You may have an allergic reaction to tape, suture, glue, blood products which can result in skin discoloration, swelling, pain, skin lesions, poor healing.  Any of these can lead to the need for revisonal surgery or  stage procedures.  A reduction has potential to interfere with diagnostic procedures.  Nipple or breast piercing can increase risks of infection.  This procedure is best done when the breast is fully developed.  Changes in the breast will continue to occur over time.  Pregnancy can alter the outcomes of previous breast reduction surgery, weight gain and weigh loss can also effect the long term appearance.    Electronically signed by: Carola Rhine Tarah Buboltz, PA-C 09/27/2020 2:37 PM

## 2020-09-29 ENCOUNTER — Ambulatory Visit
Admission: RE | Admit: 2020-09-29 | Discharge: 2020-09-29 | Disposition: A | Discharge status: Home / Self Care | Payer: Commercial Managed Care - PPO | Source: Ambulatory Visit | Attending: Surgical | Admitting: Surgical

## 2020-09-29 ENCOUNTER — Other Ambulatory Visit: Payer: Self-pay

## 2020-09-29 ENCOUNTER — Other Ambulatory Visit: Payer: Self-pay | Admitting: Surgical

## 2020-09-29 DIAGNOSIS — N62 Hypertrophy of breast: Secondary | ICD-10-CM

## 2020-09-29 NOTE — Telephone Encounter (Signed)
Shana from [facility] calling to clarify if patient can hold aspirin. She states they need to know how long to hold it. Phone: [phone], Cell: [phone]

## 2020-09-29 NOTE — Telephone Encounter (Signed)
Patient may hold aspirin for 5 to 7 days prior to the surgery and restart as soon as possible afterward.

## 2020-09-30 ENCOUNTER — Telehealth: Payer: Self-pay

## 2020-09-30 NOTE — Telephone Encounter (Signed)
Called patient, LMVM  her cardiology is okay with her holding her aspirin 7 days prior to surgery. She can restart it 24 hours after surgery. Last dose of aspirin will be on 10/10/2020

## 2020-09-30 NOTE — Telephone Encounter (Signed)
-----   Message from Charlies Constable, PA-C sent at 09/30/2020  8:59 AM EDT ----- Regarding: Holding Aspirin 7 days prior to surgery Do 1 of you mind calling Ms. [name] and letting her know that cardiology is okay with her holding her aspirin 7 days prior to surgery.  She can restart it 24 hours after surgery.  She should take her last dose of aspirin on 10/10/2020  Thanks let me know

## 2020-10-04 LAB — NICOTINE/COTININE METABOLITES
Cotinine: 3.4 ng/mL
Nicotine: <1 ng/mL

## 2020-10-14 ENCOUNTER — Encounter (HOSPITAL_COMMUNITY): Payer: Self-pay | Admitting: Plastic Surgery

## 2020-10-14 ENCOUNTER — Other Ambulatory Visit: Payer: Self-pay

## 2020-10-14 ENCOUNTER — Other Ambulatory Visit (HOSPITAL_COMMUNITY)
Admission: RE | Admit: 2020-10-14 | Discharge: 2020-10-14 | Disposition: A | Discharge status: Home / Self Care | Payer: Commercial Managed Care - PPO | Source: Ambulatory Visit | Attending: Plastic Surgery | Admitting: Plastic Surgery

## 2020-10-14 DIAGNOSIS — Z01812 Encounter for preprocedural laboratory examination: Secondary | ICD-10-CM | POA: Insufficient documentation

## 2020-10-14 DIAGNOSIS — Z20822 Contact with and (suspected) exposure to covid-19: Secondary | ICD-10-CM | POA: Diagnosis not present

## 2020-10-14 LAB — SARS CORONAVIRUS 2 (TAT 6-24 HRS): SARS Coronavirus 2: NEGATIVE

## 2020-10-14 NOTE — Progress Notes (Signed)
PCP - Dr. Glendon Axe  Cardiologist - Dr. Quay Burow  Chest x-ray -  EKG - 12/04/19 Stress Test -  ECHO - 07/01/17 Cardiac Cath - 12/23/17  Blood Thinner Instructions:  Aspirin Instructions: per pt last dose ASA 10/10/20   COVID TEST- 10/14/20 pending   Anesthesia review: n/a  -------------  SDW INSTRUCTIONS:  Your procedure is scheduled on 10/17/20. Please report to [facility] Main Entrance "A" at 0530 A.M., and check in at the Admitting office. Call this number if you have problems the morning of surgery: [phone]   Remember: Do not eat or drink after midnight the night before your surgery   Medications to take morning of surgery with a sip of water include: atorvastatin (LIPITOR)   As of today, STOP taking any Aspirin (unless otherwise instructed by your surgeon), Aleve, Naproxen, Ibuprofen, Motrin, Advil, Goody's, BC's, all herbal medications, fish oil, and all vitamins.    The Morning of Surgery Do not wear jewelry, make-up or nail polish. Do not wear lotions, powders, or perfumes, or deodorant Do not shave 48 hours prior to surgery.   Do not bring valuables to the hospital. [facility] is not responsible for any belongings or valuables. If you are a smoker, DO NOT Smoke 24 hours prior to surgery If you wear a CPAP at night please bring your mask the morning of surgery  Remember that you must have someone to transport you home after your surgery, and remain with you for 24 hours if you are discharged the same day. Please bring cases for contacts, glasses, hearing aids, dentures or bridgework because it cannot be worn into surgery.   Patients discharged the day of surgery will not be allowed to drive home.   Please shower the NIGHT BEFORE SURGERY and the MORNING OF SURGERY with DIAL Soap. Wear comfortable clothes the morning of surgery. Oral Hygiene is also important to reduce your risk of infection.  Remember - BRUSH YOUR TEETH THE MORNING OF SURGERY WITH YOUR REGULAR  TOOTHPASTE  Patient denies shortness of breath, fever, cough and chest pain.

## 2020-10-16 NOTE — Anesthesia Preprocedure Evaluation (Addendum)
Anesthesia Evaluation  Patient identified by MRN, date of birth, ID band Patient awake    Reviewed: Allergy & Precautions, NPO status , Patient's Chart, lab work & pertinent test results  History of Anesthesia Complications Negative for: history of anesthetic complications  Airway Mallampati: II  TM Distance: >3 FB Neck ROM: Full    Dental  (+) Edentulous Upper, Missing,    Pulmonary former smoker,  sarcoidosis   Pulmonary exam normal        Cardiovascular + CAD, + Past MI and + Cardiac Stents (2019)  Normal cardiovascular exam  2019 nuclear stress: EF 60%, no ST segment deviation noted during stress, medium defect of severe severity present in the mid inferior, mid inferolateral, apical inferior and apical lateral location, findings consistent with prior myocardial infarction, intermediate risk study    Neuro/Psych negative neurological ROS  negative psych ROS   GI/Hepatic negative GI ROS, Neg liver ROS,   Endo/Other  negative endocrine ROS  Renal/GU negative Renal ROS  negative genitourinary   Musculoskeletal negative musculoskeletal ROS (+)   Abdominal   Peds  Hematology negative hematology ROS (+)   Anesthesia Other Findings Day of surgery medications reviewed with patient.  Reproductive/Obstetrics negative OB ROS                            Anesthesia Physical Anesthesia Plan  ASA: III  Anesthesia Plan: General   Post-op Pain Management:    Induction: Intravenous  PONV Risk Score and Plan: 4 or greater and Treatment may vary due to age or medical condition, Ondansetron, Dexamethasone and Midazolam  Airway Management Planned: Oral ETT  Additional Equipment: None  Intra-op Plan:   Post-operative Plan: Extubation in OR  Informed Consent: I have reviewed the patients History and Physical, chart, labs and discussed the procedure including the risks, benefits and alternatives  for the proposed anesthesia with the patient or authorized representative who has indicated his/her understanding and acceptance.     Dental advisory given  Plan Discussed with: CRNA  Anesthesia Plan Comments:        Anesthesia Quick Evaluation

## 2020-10-17 ENCOUNTER — Ambulatory Visit (HOSPITAL_COMMUNITY): Payer: Commercial Managed Care - PPO | Admitting: Certified Registered Nurse Anesthetist

## 2020-10-17 ENCOUNTER — Encounter (HOSPITAL_COMMUNITY): Payer: Self-pay | Admitting: Plastic Surgery

## 2020-10-17 ENCOUNTER — Encounter (HOSPITAL_COMMUNITY)
Admission: RE | Disposition: A | Discharge status: Home / Self Care | Payer: Self-pay | Source: Ambulatory Visit | Attending: Plastic Surgery

## 2020-10-17 ENCOUNTER — Ambulatory Visit (HOSPITAL_COMMUNITY)
Admission: RE | Admit: 2020-10-17 | Discharge: 2020-10-17 | Disposition: A | Discharge status: Home / Self Care | Payer: Commercial Managed Care - PPO | Source: Ambulatory Visit | Attending: Plastic Surgery | Admitting: Plastic Surgery

## 2020-10-17 DIAGNOSIS — Z7982 Long term (current) use of aspirin: Secondary | ICD-10-CM | POA: Diagnosis not present

## 2020-10-17 DIAGNOSIS — Z87891 Personal history of nicotine dependence: Secondary | ICD-10-CM | POA: Insufficient documentation

## 2020-10-17 DIAGNOSIS — Z79899 Other long term (current) drug therapy: Secondary | ICD-10-CM | POA: Insufficient documentation

## 2020-10-17 DIAGNOSIS — Z955 Presence of coronary angioplasty implant and graft: Secondary | ICD-10-CM | POA: Insufficient documentation

## 2020-10-17 DIAGNOSIS — M545 Low back pain, unspecified: Secondary | ICD-10-CM | POA: Insufficient documentation

## 2020-10-17 DIAGNOSIS — Z803 Family history of malignant neoplasm of breast: Secondary | ICD-10-CM | POA: Diagnosis not present

## 2020-10-17 DIAGNOSIS — E785 Hyperlipidemia, unspecified: Secondary | ICD-10-CM | POA: Diagnosis not present

## 2020-10-17 DIAGNOSIS — N62 Hypertrophy of breast: Secondary | ICD-10-CM | POA: Insufficient documentation

## 2020-10-17 DIAGNOSIS — M4004 Postural kyphosis, thoracic region: Secondary | ICD-10-CM

## 2020-10-17 DIAGNOSIS — Z90710 Acquired absence of both cervix and uterus: Secondary | ICD-10-CM | POA: Diagnosis not present

## 2020-10-17 DIAGNOSIS — Z8249 Family history of ischemic heart disease and other diseases of the circulatory system: Secondary | ICD-10-CM | POA: Diagnosis not present

## 2020-10-17 DIAGNOSIS — M546 Pain in thoracic spine: Secondary | ICD-10-CM

## 2020-10-17 DIAGNOSIS — I252 Old myocardial infarction: Secondary | ICD-10-CM | POA: Diagnosis not present

## 2020-10-17 DIAGNOSIS — Z885 Allergy status to narcotic agent status: Secondary | ICD-10-CM | POA: Diagnosis not present

## 2020-10-17 HISTORY — PX: BREAST REDUCTION SURGERY: SHX8

## 2020-10-17 LAB — BASIC METABOLIC PANEL
Anion gap: 6 (ref 5–15)
BUN: 15 mg/dL (ref 6–20)
CO2: 21 mmol/L — ABNORMAL LOW (ref 22–32)
Calcium: 10 mg/dL (ref 8.9–10.3)
Chloride: 106 mmol/L (ref 98–111)
Creatinine, Ser: 0.79 mg/dL (ref 0.44–1.00)
GFR, Estimated: >60 mL/min (ref >60)
Glucose, Bld: 133 mg/dL — ABNORMAL HIGH (ref 70–99)
Potassium: 4.1 mmol/L (ref 3.5–5.1)
Sodium: 133 mmol/L — ABNORMAL LOW (ref 135–145)

## 2020-10-17 LAB — CBC
HCT: 42.8 % (ref 36.0–46.0)
Hemoglobin: 14.4 g/dL (ref 12.0–15.0)
MCH: 29.3 pg (ref 26.0–34.0)
MCHC: 33.6 g/dL (ref 30.0–36.0)
MCV: 87.2 fL (ref 80.0–100.0)
Platelets: 346 10*3/uL (ref 150–400)
RBC: 4.91 MIL/uL (ref 3.87–5.11)
RDW: 12.5 % (ref 11.5–15.5)
WBC: 6.9 10*3/uL (ref 4.0–10.5)
nRBC: 0 % (ref 0.0–0.2)

## 2020-10-17 SURGERY — MAMMOPLASTY, REDUCTION
Anesthesia: General | Site: Breast | Laterality: Bilateral

## 2020-10-17 MED ORDER — LACTATED RINGERS IV SOLN: INTRAVENOUS | Status: DC | PRN

## 2020-10-17 MED ORDER — DEXAMETHASONE SODIUM PHOSPHATE 10 MG/ML IJ SOLN
INTRAMUSCULAR | Status: DC | PRN
  Administered 2020-10-17: 5 mg via INTRAVENOUS

## 2020-10-17 MED ORDER — ORAL CARE MOUTH RINSE: 15.0000 mL | Freq: Once | OROMUCOSAL | Status: AC

## 2020-10-17 MED ORDER — FENTANYL CITRATE (PF) 250 MCG/5ML IJ SOLN
INTRAMUSCULAR | Status: AC
  Filled 2020-10-17: qty 5

## 2020-10-17 MED ORDER — FENTANYL CITRATE (PF) 100 MCG/2ML IJ SOLN
25.0000 ug | INTRAMUSCULAR | Status: DC | PRN
  Administered 2020-10-17: 25 ug via INTRAVENOUS

## 2020-10-17 MED ORDER — PROMETHAZINE HCL 25 MG/ML IJ SOLN: 6.2500 mg | INTRAMUSCULAR | Status: DC | PRN

## 2020-10-17 MED ORDER — ACETAMINOPHEN 500 MG PO TABS
1000.0000 mg | ORAL_TABLET | Freq: Once | ORAL | Status: AC
  Administered 2020-10-17: 1000 mg via ORAL
  Filled 2020-10-17: qty 2

## 2020-10-17 MED ORDER — SODIUM BICARBONATE 4.2 % IV SOLN
INTRAVENOUS | Status: DC | PRN
  Administered 2020-10-17 (×2): 1000 mL via INTRAMUSCULAR

## 2020-10-17 MED ORDER — SODIUM BICARBONATE 4.2 % IV SOLN
Freq: Once | INTRAVENOUS | Status: DC
  Filled 2020-10-17: qty 50

## 2020-10-17 MED ORDER — FENTANYL CITRATE (PF) 250 MCG/5ML IJ SOLN
INTRAMUSCULAR | Status: DC | PRN
  Administered 2020-10-17: 100 ug via INTRAVENOUS
  Administered 2020-10-17: 50 ug via INTRAVENOUS

## 2020-10-17 MED ORDER — LIDOCAINE 2% (20 MG/ML) 5 ML SYRINGE
INTRAMUSCULAR | Status: DC | PRN
  Administered 2020-10-17: 100 mg via INTRAVENOUS

## 2020-10-17 MED ORDER — MIDAZOLAM HCL 5 MG/5ML IJ SOLN
INTRAMUSCULAR | Status: DC | PRN
  Administered 2020-10-17: 2 mg via INTRAVENOUS

## 2020-10-17 MED ORDER — ROCURONIUM BROMIDE 10 MG/ML (PF) SYRINGE
PREFILLED_SYRINGE | INTRAVENOUS | Status: AC
  Filled 2020-10-17: qty 10

## 2020-10-17 MED ORDER — CHLORHEXIDINE GLUCONATE 0.12 % MT SOLN
15.0000 mL | Freq: Once | OROMUCOSAL | Status: AC
Start: 2020-10-17 — End: 2020-10-17
  Administered 2020-10-17: 15 mL via OROMUCOSAL
  Filled 2020-10-17: qty 15

## 2020-10-17 MED ORDER — LACTATED RINGERS IV SOLN: INTRAVENOUS | Status: DC

## 2020-10-17 MED ORDER — PROPOFOL 10 MG/ML IV BOLUS
INTRAVENOUS | Status: AC
  Filled 2020-10-17: qty 40

## 2020-10-17 MED ORDER — PROPOFOL 10 MG/ML IV BOLUS
INTRAVENOUS | Status: DC | PRN
  Administered 2020-10-17: 170 mg via INTRAVENOUS

## 2020-10-17 MED ORDER — CEFAZOLIN SODIUM-DEXTROSE 2-4 GM/100ML-% IV SOLN
2.0000 g | INTRAVENOUS | Status: AC
  Administered 2020-10-17: 2 g via INTRAVENOUS
  Filled 2020-10-17: qty 100

## 2020-10-17 MED ORDER — LIDOCAINE 2% (20 MG/ML) 5 ML SYRINGE
INTRAMUSCULAR | Status: AC
  Filled 2020-10-17: qty 5

## 2020-10-17 MED ORDER — FENTANYL CITRATE (PF) 100 MCG/2ML IJ SOLN
INTRAMUSCULAR | Status: AC
  Filled 2020-10-17: qty 2

## 2020-10-17 MED ORDER — 0.9 % SODIUM CHLORIDE (POUR BTL) OPTIME
TOPICAL | Status: DC | PRN
  Administered 2020-10-17: 1000 mL

## 2020-10-17 MED ORDER — ONDANSETRON HCL 4 MG/2ML IJ SOLN
INTRAMUSCULAR | Status: AC
  Filled 2020-10-17: qty 2

## 2020-10-17 MED ORDER — ONDANSETRON HCL 4 MG/2ML IJ SOLN
INTRAMUSCULAR | Status: DC | PRN
  Administered 2020-10-17: 4 mg via INTRAVENOUS

## 2020-10-17 MED ORDER — OXYCODONE HCL 5 MG PO TABS
5.0000 mg | ORAL_TABLET | Freq: Once | ORAL | Status: AC | PRN
Start: 2020-10-17 — End: 2020-10-17
  Administered 2020-10-17: 5 mg via ORAL

## 2020-10-17 MED ORDER — OXYCODONE HCL 5 MG PO TABS
ORAL_TABLET | ORAL | Status: AC
  Filled 2020-10-17: qty 1

## 2020-10-17 MED ORDER — OXYCODONE HCL 5 MG/5ML PO SOLN: 5.0000 mg | Freq: Once | ORAL | Status: AC | PRN

## 2020-10-17 MED ORDER — ROCURONIUM BROMIDE 10 MG/ML (PF) SYRINGE
PREFILLED_SYRINGE | INTRAVENOUS | Status: DC | PRN
  Administered 2020-10-17: 60 mg via INTRAVENOUS

## 2020-10-17 MED ORDER — PHENYLEPHRINE 40 MCG/ML (10ML) SYRINGE FOR IV PUSH (FOR BLOOD PRESSURE SUPPORT)
PREFILLED_SYRINGE | INTRAVENOUS | Status: DC | PRN
  Administered 2020-10-17: 40 ug via INTRAVENOUS
  Administered 2020-10-17: 80 ug via INTRAVENOUS
  Administered 2020-10-17: 40 ug via INTRAVENOUS

## 2020-10-17 MED ORDER — DEXAMETHASONE SODIUM PHOSPHATE 10 MG/ML IJ SOLN
INTRAMUSCULAR | Status: AC
  Filled 2020-10-17: qty 1

## 2020-10-17 MED ORDER — BUPIVACAINE HCL (PF) 0.25 % IJ SOLN
INTRAMUSCULAR | Status: AC
  Filled 2020-10-17: qty 30

## 2020-10-17 MED ORDER — SUGAMMADEX SODIUM 200 MG/2ML IV SOLN
INTRAVENOUS | Status: DC | PRN
  Administered 2020-10-17: 200 mg via INTRAVENOUS
  Administered 2020-10-17: 100 mg via INTRAVENOUS

## 2020-10-17 MED ORDER — EPINEPHRINE PF 1 MG/ML IJ SOLN
INTRAMUSCULAR | Status: AC
  Filled 2020-10-17: qty 1

## 2020-10-17 MED ORDER — MIDAZOLAM HCL 2 MG/2ML IJ SOLN
INTRAMUSCULAR | Status: AC
  Filled 2020-10-17: qty 2

## 2020-10-17 SURGICAL SUPPLY — 61 items
APL PRP STRL LF DISP 70% ISPRP (MISCELLANEOUS) ×2
APL SKNCLS STERI-STRIP NONHPOA (GAUZE/BANDAGES/DRESSINGS) ×1
BAG DECANTER FOR FLEXI CONT (MISCELLANEOUS) ×2 IMPLANT
BENZOIN TINCTURE PRP APPL 2/3 (GAUZE/BANDAGES/DRESSINGS) ×2 IMPLANT
BLADE SURG 10 STRL SS (BLADE) ×3 IMPLANT
BLADE SURG 15 STRL LF DISP TIS (BLADE) ×1 IMPLANT
BLADE SURG 15 STRL SS (BLADE) ×2
BNDG ELASTIC 6X5.8 VLCR STR LF (GAUZE/BANDAGES/DRESSINGS) ×2 IMPLANT
BNDG GAUZE ELAST 4 BULKY (GAUZE/BANDAGES/DRESSINGS) IMPLANT
CHLORAPREP W/TINT 26 (MISCELLANEOUS) ×4 IMPLANT
CLIP VESOCCLUDE MED 6/CT (CLIP) IMPLANT
CLSR STERI-STRIP ANTIMIC 1/2X4 (GAUZE/BANDAGES/DRESSINGS) ×1 IMPLANT
COVER WAND RF STERILE (DRAPES) IMPLANT
DECANTER SPIKE VIAL GLASS SM (MISCELLANEOUS) IMPLANT
DRAIN CHANNEL 15F RND FF W/TCR (WOUND CARE) IMPLANT
DRAPE LAPAROSCOPIC ABDOMINAL (DRAPES) ×2 IMPLANT
DRAPE UTILITY XL STRL (DRAPES) ×2 IMPLANT
DRSG PAD ABDOMINAL 8X10 ST (GAUZE/BANDAGES/DRESSINGS) ×3 IMPLANT
ELECT REM PT RETURN 9FT ADLT (ELECTROSURGICAL) ×2
ELECTRODE REM PT RTRN 9FT ADLT (ELECTROSURGICAL) ×1 IMPLANT
EVACUATOR SILICONE 100CC (DRAIN) IMPLANT
GAUZE SPONGE 4X4 12PLY STRL (GAUZE/BANDAGES/DRESSINGS) ×3 IMPLANT
GAUZE XEROFORM 5X9 LF (GAUZE/BANDAGES/DRESSINGS) ×2 IMPLANT
GLOVE BIO SURGEON STRL SZ 6.5 (GLOVE) IMPLANT
GLOVE BIO SURGEON STRL SZ7.5 (GLOVE) IMPLANT
GLOVE BIOGEL M STRL SZ7.5 (GLOVE) ×2 IMPLANT
GLOVE ECLIPSE 6.5 STRL STRAW (GLOVE) IMPLANT
GLOVE SRG 8 PF TXTR STRL LF DI (GLOVE) IMPLANT
GLOVE SURG UNDER POLY LF SZ8 (GLOVE)
GOWN STRL REUS W/ TWL LRG LVL3 (GOWN DISPOSABLE) ×2 IMPLANT
GOWN STRL REUS W/TWL LRG LVL3 (GOWN DISPOSABLE) ×4
KIT BASIN OR (CUSTOM PROCEDURE TRAY) ×2 IMPLANT
MARKER SKIN DUAL TIP RULER LAB (MISCELLANEOUS) IMPLANT
NDL SAFETY ECLIPSE 18X1.5 (NEEDLE) IMPLANT
NDL SPNL 18GX3.5 QUINCKE PK (NEEDLE) ×1 IMPLANT
NEEDLE HYPO 18GX1.5 SHARP (NEEDLE)
NEEDLE SPNL 18GX3.5 QUINCKE PK (NEEDLE) ×2 IMPLANT
NS IRRIG 1000ML POUR BTL (IV SOLUTION) ×2 IMPLANT
PACK GENERAL/GYN (CUSTOM PROCEDURE TRAY) ×2 IMPLANT
PENCIL SMOKE EVACUATOR (MISCELLANEOUS) ×2 IMPLANT
PIN SAFETY STERILE (MISCELLANEOUS) IMPLANT
SHEET MEDIUM DRAPE 40X70 STRL (DRAPES) IMPLANT
SLEEVE SCD COMPRESS KNEE MED (STOCKING) IMPLANT
SPONGE LAP 18X18 RF (DISPOSABLE) ×2 IMPLANT
STAPLER INSORB 30 2030 C-SECTI (MISCELLANEOUS) ×3 IMPLANT
STAPLER VISISTAT 35W (STAPLE) ×2 IMPLANT
STRIP CLOSURE SKIN 1/2X4 (GAUZE/BANDAGES/DRESSINGS) ×6 IMPLANT
SUT CHROMIC 4 0 PS 2 18 (SUTURE) ×4 IMPLANT
SUT ETHILON 2 0 FS 18 (SUTURE) IMPLANT
SUT ETHILON 3 0 PS 1 (SUTURE) ×4 IMPLANT
SUT MNCRL AB 4-0 PS2 18 (SUTURE) IMPLANT
SUT PDS AB 2-0 CT2 27 (SUTURE) IMPLANT
SUT VIC AB 3-0 PS1 18 (SUTURE) ×4
SUT VIC AB 3-0 PS1 18XBRD (SUTURE) ×2 IMPLANT
SUT VLOC 90 P-14 23 (SUTURE) ×5 IMPLANT
SYR 50ML LL SCALE MARK (SYRINGE) ×4 IMPLANT
TAPE MEASURE VINYL STERILE (MISCELLANEOUS) IMPLANT
TOWEL GREEN STERILE FF (TOWEL DISPOSABLE) ×4 IMPLANT
TRAY FOLEY W/BAG SLVR 14FR LF (SET/KITS/TRAYS/PACK) IMPLANT
TUBING INFILTRATION IT-10001 (TUBING) ×1 IMPLANT
UNDERPAD 30X36 HEAVY ABSORB (UNDERPADS AND DIAPERS) ×4 IMPLANT

## 2020-10-17 NOTE — Transfer of Care (Signed)
Immediate Anesthesia Transfer of Care Note  Patient: PAMELLA SAMONS  Procedure(s) Performed: MAMMARY REDUCTION  (BREAST) (Bilateral Breast)  Patient Location: PACU  Anesthesia Type:General  Level of Consciousness: drowsy  Airway & Oxygen Therapy: Patient Spontanous Breathing and Patient connected to face mask oxygen  Post-op Assessment: Report given to RN and Post -op Vital signs reviewed and stable  Post vital signs: Reviewed and stable  Last Vitals:  Vitals Value Taken Time  BP 133/47 10/17/20 0939  Temp    Pulse 81 10/17/20 0941  Resp 18 10/17/20 0941  SpO2 100 % 10/17/20 0941  Vitals shown include unvalidated device data.  Last Pain:  Vitals:   10/17/20 0609  TempSrc:   PainSc: 0-No pain      Patients Stated Pain Goal: 6 (44/96/75 9163)  Complications: No complications documented.

## 2020-10-17 NOTE — Discharge Instructions (Addendum)
Activity As tolerated: NO showers for 3 days. Keep ACE wrap on breasts until then. After showering, put ACE wrap back on, this is important for compression. NO driving while in pain, taking pain medication or if you are unable to safely react to traffic. No heavy activities Take Pain medication (Norco) as needed for severe pain. Otherwise, you can use ibuprofen or tylenol as needed. NO more than 3,000 mg of tylenol in 24 hours. Norco has 325mg  of tylenol per dose.  Diet: Regular. Drink plenty of fluids and eat healthy, high protein, low carbs.  Wound Care: Keep dressing clean & dry. You may change bandages after showering if you continue to notice some drainage. You can reuse bandages if they are not dirty/soiled.  Special Instructions: Call Doctor if any unusual problems occur such as pain, excessive Bleeding, unrelieved Nausea/vomiting, Fever &/or chills  Follow-up appointment: Scheduled for next week.

## 2020-10-17 NOTE — Brief Op Note (Signed)
10/17/2020  9:24 AM  PATIENT:  [name]  55 y.o. female  PRE-OPERATIVE DIAGNOSIS:  macromastia  POST-OPERATIVE DIAGNOSIS:  macromastia  PROCEDURE:  Procedure(s) with comments: MAMMARY REDUCTION  (BREAST) (Bilateral) - 2 hours  SURGEON:  Surgeon(s) and Role:    * Matisyn Cabeza, Steffanie Dunn, MD - Primary  PHYSICIAN ASSISTANT: Software engineer, PA  ASSISTANTS: none   ANESTHESIA:   general  EBL:  40   BLOOD ADMINISTERED:none  DRAINS: none   LOCAL MEDICATIONS USED:  MARCAINE     SPECIMEN:  Source of Specimen:  r and l breast tissue  DISPOSITION OF SPECIMEN:  PATHOLOGY  COUNTS:  YES  TOURNIQUET:  * No tourniquets in log *  DICTATION: .Dragon Dictation  PLAN OF CARE: Discharge to home after PACU  PATIENT DISPOSITION:  PACU - hemodynamically stable.   Delay start of Pharmacological VTE agent (>24hrs) due to surgical blood loss or risk of bleeding: not applicable

## 2020-10-17 NOTE — Anesthesia Procedure Notes (Addendum)
Procedure Name: Intubation Date/Time: 10/17/2020 7:53 AM Performed by: Janene Harvey, CRNA Pre-anesthesia Checklist: Patient identified, Emergency Drugs available, Suction available and Patient being monitored Patient Re-evaluated:Patient Re-evaluated prior to induction Oxygen Delivery Method: Circle system utilized Preoxygenation: Pre-oxygenation with 100% oxygen Induction Type: IV induction Ventilation: Mask ventilation without difficulty Laryngoscope Size: Mac and 3 Grade View: Grade I Tube type: Oral Number of attempts: 1 Airway Equipment and Method: Stylet Placement Confirmation: ETT inserted through vocal cords under direct vision,  positive ETCO2 and breath sounds checked- equal and bilateral Secured at: 21 cm Tube secured with: Tape Dental Injury: Teeth and Oropharynx as per pre-operative assessment  Comments: Intubation by [facility]

## 2020-10-17 NOTE — Interval H&P Note (Signed)
Patient seen and examined. Risks and benefits discussed. Proceed with surgery.

## 2020-10-17 NOTE — Op Note (Signed)
Operative Note   DATE OF OPERATION: 10/17/2020  LOCATION: [facility]   SURGICAL DEPARTMENT: Plastic Surgery  PREOPERATIVE DIAGNOSES: Bilateral symptomatic macromastia.  POSTOPERATIVE DIAGNOSES:  same  PROCEDURE: Bilateral breast reduction with superomedial pedicle.  SURGEON: Talmadge Coventry, MD  ASSISTANT: Verdie Shire, PA The advanced practice practitioner (APP) assisted throughout the case.  The APP was essential in retraction and counter traction when needed to make the case progress smoothly.  This retraction and assistance made it possible to see the tissue plans for the procedure.  The assistance was needed for blood control, tissue re-approximation and assisted with closure of the incision site.  ANESTHESIA: General.  COMPLICATIONS: None.   INDICATIONS FOR PROCEDURE:  The patient, [name] is a 55 y.o. female born on 03/17/1966, is here for treatment of bilateral symptomatic macromastia. MRN: [id number]  CONSENT:  Informed consent was obtained directly from the patient. Risks, benefits and alternatives were fully discussed. Specific risks including but not limited to bleeding, infection, hematoma, seroma, scarring, pain, infection, contracture, asymmetry, wound healing problems, and need for further surgery were all discussed. The patient did have an ample opportunity to have questions answered to satisfaction.   DESCRIPTION OF PROCEDURE:  The patient was marked preoperatively for a Wise pattern skin excision.  The patient was taken to the operating room. SCDs were placed and antibiotics were given. General anesthesia was administered.The patient's operative site was prepped and draped in a sterile fashion. A time out was performed and all information was confirmed to be correct.  Right Breast: The breast was infiltrated with tumescent solution to help with hemostasis.  The nipple was marked with a cookie cutter.  A superomedial pedicle was drawn  out with the base of at least 8 cm in size.  A breast tourniquet was then applied and the pedicle was de-epithelialized.  Breast tourniquet was then let down and all incisions were made with a 10 blade.  The pedicle was then isolated down to the chest wall with cautery and the excision was performed removing tissue primarily inferiorly and laterally.  Hemostasis was obtained and the wound was stapled closed.  Left breast:  The breast was infiltrated with tumescent solution to help with hemostasis.  The nipple was marked with a cookie cutter.  A superomedial pedicle was drawn out with the base of at least 8 cm in size.  A breast tourniquet was then applied and the pedicle was de-epithelialized.  Breast tourniquet was then let down and all incisions were made with a 10 blade.  The pedicle was then isolated down to the chest wall with cautery and the excision was performed removing tissue primarily inferiorly and laterally.  Hemostasis was obtained and the wound was stapled closed.  Patient was then set up to check for size and symmetry.  Minor modifications were made.  This resulted in a total of 903g removed from the right side and 979g removed from the left side.  The inframammary incision was closed with a combination of buried in-sorb staples and a running 3-0 Quill suture.  The vertical and periareolar limbs were closed with interrupted buried 4-0 Monocryl and a running 4-0 Quill suture.  Steri-Strips were then applied along with a soft dressing and Ace wrap.  The patient tolerated the procedure well.  There were no complications. The patient was allowed to wake from anesthesia, extubated and taken to the recovery room in satisfactory condition.  I was present for the entire procedure.

## 2020-10-17 NOTE — Anesthesia Postprocedure Evaluation (Signed)
Anesthesia Post Note  Patient: [name]  Procedure(s) Performed: MAMMARY REDUCTION  (BREAST) (Bilateral Breast)     Patient location during evaluation: PACU Anesthesia Type: General Level of consciousness: awake and alert and oriented Pain management: pain level controlled Vital Signs Assessment: post-procedure vital signs reviewed and stable Respiratory status: spontaneous breathing, nonlabored ventilation and respiratory function stable Cardiovascular status: blood pressure returned to baseline Postop Assessment: no apparent nausea or vomiting Anesthetic complications: no   No complications documented.  Last Vitals:  Vitals:   10/17/20 1010 10/17/20 1024  BP: 136/86 138/78  Pulse: 79 75  Resp: 19 14  Temp:  (!) 36.2 C  SpO2: 98% 99%               Brennan Bailey

## 2020-10-18 ENCOUNTER — Encounter (HOSPITAL_COMMUNITY): Payer: Self-pay | Admitting: Plastic Surgery

## 2020-10-18 LAB — SURGICAL PATHOLOGY

## 2020-10-26 ENCOUNTER — Other Ambulatory Visit: Payer: Self-pay

## 2020-10-26 ENCOUNTER — Telehealth: Payer: Self-pay | Admitting: Plastic Surgery

## 2020-10-26 ENCOUNTER — Ambulatory Visit (INDEPENDENT_AMBULATORY_CARE_PROVIDER_SITE_OTHER): Payer: Commercial Managed Care - PPO | Admitting: Plastic Surgery

## 2020-10-26 DIAGNOSIS — N62 Hypertrophy of breast: Secondary | ICD-10-CM

## 2020-10-26 NOTE — Telephone Encounter (Signed)
Patient went to [facility] as advised and Lattie Haw, with [facility], called to advise she needs a prescription with dx code on for patient's compression garment and it can be submitted to insurance. Her fax # is [phone]. Please attn to [place].

## 2020-10-26 NOTE — Telephone Encounter (Signed)
Fax prescription with Dx code for patient compression garment to [facility].  Confirmation received and copy scanned into the chart.//AB/CMA

## 2020-10-26 NOTE — Progress Notes (Signed)
Patient presents about 1 week out from bilateral breast reduction.  She feels good and is happy.  On exam everything looks to be healing fine with intact incisions and viable nipple areolar complexes.  There is no subcutaneous fluid.  I have asked her to continue compressive garments and avoid strenuous activity.  We will see her again in a few weeks.  

## 2020-11-03 ENCOUNTER — Encounter: Payer: Commercial Managed Care - PPO | Admitting: Plastic Surgery

## 2020-11-10 ENCOUNTER — Ambulatory Visit (INDEPENDENT_AMBULATORY_CARE_PROVIDER_SITE_OTHER): Payer: Commercial Managed Care - PPO | Admitting: Plastic Surgery

## 2020-11-10 ENCOUNTER — Other Ambulatory Visit: Payer: Self-pay

## 2020-11-10 DIAGNOSIS — N62 Hypertrophy of breast: Secondary | ICD-10-CM

## 2020-11-10 NOTE — Progress Notes (Signed)
Patient presents 3 weeks out from her breast reduction.  Overall she feels like things are going well.  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  On exam everything looks to be doing well.  There is no obvious subcutaneous fluid and incisions are all intact.  She has good shape size and symmetry.  We will plan to keep her out of work for another 2 weeks and see her 2 to 3 weeks from now in the clinic.  She will keep [place] updated with any changes.  All of her questions were answered.

## 2020-11-21 ENCOUNTER — Telehealth: Payer: Self-pay | Admitting: Plastic Surgery

## 2020-11-21 NOTE — Telephone Encounter (Signed)
[name] called and stated that her left side is in a lot of pain. She can barely lift her left arm, describing a "pulling" sensation and stated she couldn't return to work. [name] requested someone to call her regarding next steps. Her  f/u is 6/22.

## 2020-11-22 ENCOUNTER — Telehealth: Payer: Self-pay

## 2020-11-22 NOTE — Telephone Encounter (Signed)
Returned patients call. Patient had BL breast reduction superomedial on 10/17/20 with Dr. Claudia Desanctis. She is still having a lot of constant achy pain, tenderness,and sensation like it is on "fire" from the lateral side of left breast. 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 Denies any fever, chills, nor nausea. Mentioned she did vomit last week, but didn't know if it was due to her circumstance. Verified there were no openings nor drainage from all incision areas. Agreed that we will extend her return to work on 12/01/20 after the follow up appointment with Dr. Claudia Desanctis on 11/30/20.

## 2020-11-22 NOTE — Telephone Encounter (Signed)
Patient called to say that her left side is really hurting.  Patient said it feels like it's pulling and she can barely lift her arm.  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  Please call.

## 2020-11-30 ENCOUNTER — Encounter: Payer: Self-pay | Admitting: Physician Assistant

## 2020-11-30 ENCOUNTER — Ambulatory Visit (INDEPENDENT_AMBULATORY_CARE_PROVIDER_SITE_OTHER): Payer: Commercial Managed Care - PPO | Admitting: Physician Assistant

## 2020-11-30 ENCOUNTER — Other Ambulatory Visit: Payer: Self-pay

## 2020-11-30 ENCOUNTER — Ambulatory Visit (INDEPENDENT_AMBULATORY_CARE_PROVIDER_SITE_OTHER): Payer: Commercial Managed Care - PPO | Admitting: Plastic Surgery

## 2020-11-30 VITALS — BP 125/81 | HR 81 | Ht 64.0 in | Wt 202.6 lb

## 2020-11-30 DIAGNOSIS — I10 Essential (primary) hypertension: Secondary | ICD-10-CM

## 2020-11-30 DIAGNOSIS — E785 Hyperlipidemia, unspecified: Secondary | ICD-10-CM | POA: Diagnosis not present

## 2020-11-30 DIAGNOSIS — I2510 Atherosclerotic heart disease of native coronary artery without angina pectoris: Secondary | ICD-10-CM | POA: Diagnosis not present

## 2020-11-30 DIAGNOSIS — N62 Hypertrophy of breast: Secondary | ICD-10-CM

## 2020-11-30 LAB — LIPID PANEL
Chol/HDL Ratio: 4.5 ratio — ABNORMAL HIGH (ref 0.0–4.4)
Cholesterol, Total: 138 mg/dL (ref 100–199)
HDL: 31 mg/dL — ABNORMAL LOW (ref >39)
LDL Chol Calc (NIH): 80 mg/dL (ref 0–99)
Triglycerides: 153 mg/dL — ABNORMAL HIGH (ref 0–149)
VLDL Cholesterol Cal: 27 mg/dL (ref 5–40)

## 2020-11-30 LAB — HEPATIC FUNCTION PANEL
ALT: 22 IU/L (ref 0–32)
AST: 19 IU/L (ref 0–40)
Albumin: 4.2 g/dL (ref 3.8–4.9)
Alkaline Phosphatase: 164 IU/L — ABNORMAL HIGH (ref 44–121)
Bilirubin Total: 0.5 mg/dL (ref 0.0–1.2)
Bilirubin, Direct: 0.14 mg/dL (ref 0.00–0.40)
Total Protein: 6.6 g/dL (ref 6.0–8.5)

## 2020-11-30 NOTE — Progress Notes (Signed)
Cardiology Office Note:    Date:  11/30/2020   ID:  TENNYSON KALLEN, DOB 21-Aug-1965, MRN [id number]  PCP:  Glendon Axe, MD   [facility] Providers Cardiologist:  Quay Burow, MD     Referring MD: Glendon Axe, MD   Chief Complaint  Patient presents with   Follow-up    Annual visit for Dr. Gwenlyn Found. DOT eval    History of Present Illness:    [name] is a 55 y.o. female with a hx of CAD, hypertension, hyperlipidemia, tobacco abuse and a history of sarcoidosis.  Patient had a NSTEMI in January 2019 with DES to proximal AV groove left circumflex, she also had a 70% proximal LAD lesion that was managed medically.  Echocardiogram at that time showed normal EF.  Myoview obtained on 10/31/2017 showed EF 60%, medium defect of severe severity present in the mid inferior, mid inferolateral, apical inferior, and the apical lateral location, this is consistent with prior MI, no ischemia was identified.  Due to persistent symptoms, she returned back to the Cath Lab on 12/23/2017 that showed widely patent left circumflex stent, 25% proximal LAD lesion, EF 50 to 55% by LV gram.  Patient was last seen by Dr. Gwenlyn Found on 12/04/2019 at which time she was doing well after completing a 1 year course of Plavix.  She was driving a city bus for work.  Her aspirin was reduced to 81 mg daily.  Patient presents today for 1 year follow-up.  Her EKG continues to show T wave reemerging in the inferolateral leads, this is unchanged when compared to the previous EKG from June of last year.  She is able to walk 30 minutes without any exertional chest discomfort or worsening dyspnea.  She lives on the second floor and has to climb stairs on a daily basis.  Given lack of exertional symptoms and reassuring EKG, she is cleared from the cardiac perspective to drive the city bus.  I will attach her EKG today.  As mentioned above, her echocardiogram and a stress test were done back in 2018.  Personally given lack of her  chest discomfort, I will not normally recommend a repeat Myoview unless indicated by DOT requirement.  I asked her to double check with the DOT requirement, if repeat stress test is needed, please let us know so we can order one.  She never had a prior history of VTE or palpitation, a 24-hour Holter monitor is not indicated.  She never had a pacemaker in the past either.  Past Medical History:  Diagnosis Date   Anemia    Fibroid    Hyperlipidemia    NSTEMI (non-ST elevated myocardial infarction) ([place]) 06/2017   s/p DES to LCX 07/02/17   Sarcoidosis     Past Surgical History:  Procedure Laterality Date   ABDOMINAL HYSTERECTOMY     BREAST BIOPSY Right 03/24/2018   BREAST REDUCTION SURGERY Bilateral 10/17/2020   Procedure: MAMMARY REDUCTION  (BREAST);  Surgeon: Cindra Presume, MD;  Location: [facility];  Service: Plastics;  Laterality: Bilateral;  2 hours   CESAREAN SECTION  1987   CORONARY/GRAFT ACUTE MI REVASCULARIZATION N/A 06/30/2017   Procedure: Coronary/Graft Acute MI Revascularization;  Surgeon: Lorretta Harp, MD;  Location: [facility];  Service: Cardiovascular;  Laterality: N/A;   FOREARM FRACTURE SURGERY Right 1977   FRACTURE SURGERY     LEFT HEART CATH AND CORONARY ANGIOGRAPHY N/A 06/30/2017   Procedure: LEFT HEART CATH AND CORONARY ANGIOGRAPHY;  Surgeon: Gwenlyn Found,  Pearletha Forge, MD;  Location: [facility];  Service: Cardiovascular;  Laterality: N/A;   LEFT HEART CATH AND CORONARY ANGIOGRAPHY N/A 12/23/2017   Procedure: LEFT HEART CATH AND CORONARY ANGIOGRAPHY;  Surgeon: Lorretta Harp, MD;  Location: [facility];  Service: Cardiovascular;  Laterality: N/A;   TUBAL LIGATION  1990    Current Medications: Current Meds  Medication Sig   APPLE CIDER VINEGAR PO Take 2 each by mouth daily. [place]   aspirin EC 81 MG tablet Take 1 tablet (81 mg total) by mouth daily. Swallow whole.   atorvastatin (LIPITOR) 20 MG tablet TAKE 1 TABLET BY MOUTH EVERY DAY (Patient taking  differently: Take 20 mg by mouth daily.)   Biotin 1000 MCG CHEW Chew 2,000 mcg by mouth daily.   cholecalciferol (VITAMIN D3) 25 MCG (1000 UNIT) tablet Take 2,000 Units by mouth daily. Gummies     Allergies:   Hydrocodone   Social History   Socioeconomic History   Marital status: Widowed    Spouse name: Not on file   Number of children: 2   Years of education: Not on file   Highest education level: Not on file  Occupational History   Occupation: Chiropodist: Doctor, general practice  Tobacco Use   Smoking status: Former    Packs/day: 1.00    Years: 29.00    Pack years: 29.00    Types: Cigarettes    Quit date: 03/19/2018    Years since quitting: 2.7   Smokeless tobacco: Never  Vaping Use   Vaping Use: Never used  Substance and Sexual Activity   Alcohol use: Yes    Comment: 12/20/2017 "couple drinks once/yr"   Drug use: Never   Sexual activity: Not on file  Other Topics Concern   Not on file  Social History Narrative   Not on file   Social Determinants of Health   Financial Resource Strain: Not on file  Food Insecurity: Not on file  Transportation Needs: Not on file  Physical Activity: Not on file  Stress: Not on file  Social Connections: Not on file     Family History: The patient's family history includes Breast cancer in her paternal grandmother; Diabetes in her mother; Hyperlipidemia in her father; Hypertension in her father and mother; Stroke (age of onset: 10) in her mother; Stroke (age of onset: 28) in her father.  ROS:   Please see the history of present illness.     All other systems reviewed and are negative.  EKGs/Labs/Other Studies Reviewed:    The following studies were reviewed today:  Echo 07/01/2017 LV EF: 55% -   60%   Study Conclusions   - Left ventricle: The cavity size was normal. Wall thickness was    increased in a pattern of mild LVH. Systolic function was normal.    The estimated ejection fraction was in the range  of 55% to 60%.    Wall motion was normal; there were no regional wall motion    abnormalities. Left ventricular diastolic function parameters    were normal.  - Atrial septum: No defect or patent foramen ovale was identified.    Cath 07/01/2017 Prox LAD lesion is 70% stenosed. 3rd Mrg lesion is 100% stenosed. Prox Cx to Mid Cx lesion is 90% stenosed. A stent was successfully placed. Post intervention, there is a 0% residual stenosis. There is mild to moderate left ventricular systolic dysfunction. LV end diastolic pressure is normal. The left  ventricular ejection fraction is 45-50% by visual estimate.   ETT 10/18/2017 Blood pressure demonstrated a normal response to exercise.   Decreased exercise capacity. Normal blood pressure response to exercise. This study is un interpretable as baseline ECG is not normal (anterolateral and inferior ST depressions and negative T waves). Consider stress test with imaging such as nuclear stress test or coronary CTA/FFR.      Myoview 10/31/2017 Nuclear stress EF: 60%. No wall motion abnormalities. The left ventricular ejection fraction is normal (55-65%). There was no ST segment deviation noted during stress. Defect 1: There is a medium defect of severe severity present in the mid inferior, mid inferolateral, apical inferior and apical lateral location. Findings consistent with prior myocardial infarction. This is an intermediate risk study. No ischemia identified      Cath 12/23/2017 Previously placed Prox Cx to Mid Cx stent (unknown type) is widely patent. Prox LAD lesion is 25% stenosed. There is mild left ventricular systolic dysfunction. The left ventricular ejection fraction is 50-55% by visual estimate.  Diagnostic Dominance: Right    EKG:  EKG is ordered today.  The ekg ordered today demonstrates normal sinus rhythm, T wave inversion in the inferior leads, unchanged when compared to 2021 EKG.  Recent Labs: 12/04/2019: ALT  22 10/17/2020: BUN 15; Creatinine, Ser 0.79; Hemoglobin 14.4; Platelets 346; Potassium 4.1; Sodium 133  Recent Lipid Panel    Component Value Date/Time   CHOL 184 12/04/2019 1049   TRIG 121 12/04/2019 1049   HDL 37 (L) 12/04/2019 1049   CHOLHDL 5.0 (H) 12/04/2019 1049   CHOLHDL 4.1 07/01/2017 0326   VLDL 17 07/01/2017 0326   LDLCALC 125 (H) 12/04/2019 1049     Risk Assessment/Calculations:           Physical Exam:    VS:  BP 125/81   Pulse 81   Ht 5\' 4"  (1.626 m)   Wt 202 lb 9.6 oz (91.9 kg)   SpO2 99%   BMI 34.78 kg/m     Wt Readings from Last 3 Encounters:  11/30/20 202 lb 9.6 oz (91.9 kg)  10/17/20 193 lb (87.5 kg)  09/27/20 197 lb (89.4 kg)     GEN:  Well nourished, well developed in no acute distress HEENT: Normal NECK: No JVD; No carotid bruits LYMPHATICS: No lymphadenopathy CARDIAC: RRR, no murmurs, rubs, gallops RESPIRATORY:  Clear to auscultation without rales, wheezing or rhonchi  ABDOMEN: Soft, non-tender, non-distended MUSCULOSKELETAL:  No edema; No deformity  SKIN: Warm and dry NEUROLOGIC:  Alert and oriented x 3 PSYCHIATRIC:  Normal affect   ASSESSMENT:    1. Coronary artery disease involving native coronary artery of native heart without angina pectoris   2. Hyperlipidemia with target LDL less than 70   3. Primary hypertension    PLAN:    In order of problems listed above:  CAD: Last PCI in January 2019.  Myoview in May 2019 was negative.  Due to persistent symptoms, she went back to the Cath Lab in July 2019 that showed widely patent stent, very mild LAD disease residual.  She denies any significant chest discomfort today.  She is clearly able to accomplish more than 4 METS of activity without any issue.  From the cardiac perspective, she is cleared to continue to drive a city bus.  Only question remains is if she require a nuclear stress test, from the cardiac perspective, she is asymptomatic and does not need a stress test based on her  symptom, however if DOT  requires a periodic stress test once every few years, we can potentially do a stress test.  Her last Myoview was in May 2019.  Patient will contact us if she does require stress test.  Hyperlipidemia: Continue Lipitor.  Due for fasting lipid panel and LFT.  Hypertension: Although she has a history of hypertension, she is really not on any blood pressure medication and her blood pressure is very well controlled despite of no medication.        Medication Adjustments/Labs and Tests Ordered: Current medicines are reviewed at length with the patient today.  Concerns regarding medicines are outlined above.  Orders Placed This Encounter  Procedures   Lipid panel   Hepatic function panel   EKG 12-Lead   No orders of the defined types were placed in this encounter.   Patient Instructions  Medication Instructions:  Your physician recommends that you continue on your current medications as directed. Please refer to the Current Medication list given to you today.  *If you need a refill on your cardiac medications before your next appointment, please call your pharmacy*  Lab Work: Your physician recommends that you return for lab work TODAY:  FLP LFT If you have labs (blood work) drawn today and your tests are completely normal, you will receive your results only by: [place] (if you have MyChart) OR A paper copy in the mail If you have any lab test that is abnormal or we need to change your treatment, we will call you to review the results.  Testing/Procedures: NONE ordered at this time of appointment   Follow-Up: At [facility], you and your health needs are our priority.  As part of our continuing mission to provide you with exceptional heart care, we have created designated Provider Care Teams.  These Care Teams include your primary Cardiologist (physician) and Advanced Practice Providers (APPs -  Physician Assistants and Nurse Practitioners) who all  work together to provide you with the care you need, when you need it.  We recommend signing up for the patient portal called "MyChart".  Sign up information is provided on this After Visit Summary.  MyChart is used to connect with patients for Virtual Visits (Telemedicine).  Patients are able to view lab/test results, encounter notes, upcoming appointments, etc.  Non-urgent messages can be sent to your provider as well.   To learn more about what you can do with MyChart, go to [email or web address].    Your next appointment:   12 month(s)  The format for your next appointment:   In Person  Provider:   Quay Burow, MD  Other Instructions   Signed, Almyra Deforest, [place]  11/30/2020 10:10 AM    [place]

## 2020-11-30 NOTE — Progress Notes (Signed)
Patient presents about 6 weeks out from bilateral breast reduction.  She reports she is feeling some pulling in the left side but the right right side feels fine.  On exam everything looks to be healing well with appropriate size shape and symmetry.  I do not definitively detect any abnormalities on the left side where she is pointing.  It is possible she has a small fluid collection on that side.  If that is the case it should resolve on its own.  We will plan to see her in about 6 weeks.  She is okay to return to work tomorrow.  All her questions were answered.

## 2020-11-30 NOTE — Patient Instructions (Signed)
Medication Instructions:  Your physician recommends that you continue on your current medications as directed. Please refer to the Current Medication list given to you today.  *If you need a refill on your cardiac medications before your next appointment, please call your pharmacy*  Lab Work: Your physician recommends that you return for lab work TODAY:  FLP LFT If you have labs (blood work) drawn today and your tests are completely normal, you will receive your results only by: [place] (if you have MyChart) OR A paper copy in the mail If you have any lab test that is abnormal or we need to change your treatment, we will call you to review the results.  Testing/Procedures: NONE ordered at this time of appointment   Follow-Up: At [facility], you and your health needs are our priority.  As part of our continuing mission to provide you with exceptional heart care, we have created designated Provider Care Teams.  These Care Teams include your primary Cardiologist (physician) and Advanced Practice Providers (APPs -  Physician Assistants and Nurse Practitioners) who all work together to provide you with the care you need, when you need it.  We recommend signing up for the patient portal called "MyChart".  Sign up information is provided on this After Visit Summary.  MyChart is used to connect with patients for Virtual Visits (Telemedicine).  Patients are able to view lab/test results, encounter notes, upcoming appointments, etc.  Non-urgent messages can be sent to your provider as well.   To learn more about what you can do with MyChart, go to [email or web address].    Your next appointment:   12 month(s)  The format for your next appointment:   In Person  Provider:   Quay Burow, MD  Other Instructions

## 2020-12-01 ENCOUNTER — Other Ambulatory Visit: Payer: Self-pay

## 2020-12-01 DIAGNOSIS — E785 Hyperlipidemia, unspecified: Secondary | ICD-10-CM

## 2020-12-01 MED ORDER — ATORVASTATIN CALCIUM 40 MG PO TABS: 40.0000 mg | ORAL_TABLET | Freq: Every day | ORAL | 3 refills | Status: DC

## 2020-12-01 NOTE — Progress Notes (Signed)
Liver function ok, bad cholesterol LDL still not at goal, triglyceride borderline high, recommend increase lipitor to 40mg  daily, repeat FLP and LFT in 3-6 month

## 2020-12-15 ENCOUNTER — Encounter: Payer: Self-pay | Admitting: Plastic Surgery

## 2021-01-12 ENCOUNTER — Other Ambulatory Visit: Payer: Self-pay

## 2021-01-12 ENCOUNTER — Ambulatory Visit (INDEPENDENT_AMBULATORY_CARE_PROVIDER_SITE_OTHER): Payer: BLUE CROSS/BLUE SHIELD | Admitting: Plastic Surgery

## 2021-01-12 DIAGNOSIS — N62 Hypertrophy of breast: Secondary | ICD-10-CM

## 2021-01-12 NOTE — Progress Notes (Signed)
Patient presents about 3 months out from bilateral breast reduction.  She is overall happy but still has some pain in the axillary areas on both sides.  On exam everything looks to be doing well.  She has good shape size and symmetry.  All incisions of healed nicely.  No subcutaneous fluid.  She does want to be a little bit smaller.  I do not see any abnormalities in the axillary area and would expect this pain and tenderness to resolve over time.  I did take over 2 pounds off per breast which is a significant reduction compared to her preoperative photographs.  If she does want to be smaller we could discuss revision reduction down the line.  We will plan to let her think about this for a few months and then she will call us if she would like to discuss anything further.

## 2021-02-14 ENCOUNTER — Encounter (HOSPITAL_COMMUNITY): Payer: Self-pay | Admitting: Emergency Medicine

## 2021-02-14 ENCOUNTER — Emergency Department (HOSPITAL_COMMUNITY)
Admission: EM | Admit: 2021-02-14 | Discharge: 2021-02-14 | Disposition: A | Discharge status: Home / Self Care | Payer: BLUE CROSS/BLUE SHIELD | Attending: Emergency Medicine | Admitting: Emergency Medicine

## 2021-02-14 ENCOUNTER — Emergency Department (HOSPITAL_COMMUNITY): Payer: BLUE CROSS/BLUE SHIELD

## 2021-02-14 ENCOUNTER — Other Ambulatory Visit: Payer: Self-pay

## 2021-02-14 DIAGNOSIS — Z7982 Long term (current) use of aspirin: Secondary | ICD-10-CM | POA: Insufficient documentation

## 2021-02-14 DIAGNOSIS — Z79899 Other long term (current) drug therapy: Secondary | ICD-10-CM | POA: Insufficient documentation

## 2021-02-14 DIAGNOSIS — Z87891 Personal history of nicotine dependence: Secondary | ICD-10-CM | POA: Insufficient documentation

## 2021-02-14 DIAGNOSIS — I2510 Atherosclerotic heart disease of native coronary artery without angina pectoris: Secondary | ICD-10-CM | POA: Insufficient documentation

## 2021-02-14 DIAGNOSIS — R109 Unspecified abdominal pain: Secondary | ICD-10-CM | POA: Insufficient documentation

## 2021-02-14 DIAGNOSIS — N9489 Other specified conditions associated with female genital organs and menstrual cycle: Secondary | ICD-10-CM | POA: Insufficient documentation

## 2021-02-14 LAB — URINALYSIS, ROUTINE W REFLEX MICROSCOPIC
Bilirubin Urine: NEGATIVE
Glucose, UA: NEGATIVE mg/dL
Hgb urine dipstick: NEGATIVE
Ketones, ur: NEGATIVE mg/dL
Leukocytes,Ua: NEGATIVE
Nitrite: NEGATIVE
Protein, ur: NEGATIVE mg/dL
Specific Gravity, Urine: 1.01 (ref 1.005–1.030)
pH: 7 (ref 5.0–8.0)

## 2021-02-14 LAB — HEPATIC FUNCTION PANEL
ALT: 29 U/L (ref 0–44)
AST: 29 U/L (ref 15–41)
Albumin: 3.7 g/dL (ref 3.5–5.0)
Alkaline Phosphatase: 132 U/L — ABNORMAL HIGH (ref 38–126)
Bilirubin, Direct: 0.2 mg/dL (ref 0.0–0.2)
Indirect Bilirubin: 0.4 mg/dL (ref 0.3–0.9)
Total Bilirubin: 0.6 mg/dL (ref 0.3–1.2)
Total Protein: 7.1 g/dL (ref 6.5–8.1)

## 2021-02-14 LAB — CBC
HCT: 44.3 % (ref 36.0–46.0)
Hemoglobin: 14.3 g/dL (ref 12.0–15.0)
MCH: 28.5 pg (ref 26.0–34.0)
MCHC: 32.3 g/dL (ref 30.0–36.0)
MCV: 88.2 fL (ref 80.0–100.0)
Platelets: 336 10*3/uL (ref 150–400)
RBC: 5.02 MIL/uL (ref 3.87–5.11)
RDW: 13.5 % (ref 11.5–15.5)
WBC: 7.7 10*3/uL (ref 4.0–10.5)
nRBC: 0 % (ref 0.0–0.2)

## 2021-02-14 LAB — BASIC METABOLIC PANEL
Anion gap: 6 (ref 5–15)
BUN: 11 mg/dL (ref 6–20)
CO2: 24 mmol/L (ref 22–32)
Calcium: 10.2 mg/dL (ref 8.9–10.3)
Chloride: 110 mmol/L (ref 98–111)
Creatinine, Ser: 0.84 mg/dL (ref 0.44–1.00)
GFR, Estimated: >60 mL/min (ref >60)
Glucose, Bld: 95 mg/dL (ref 70–99)
Potassium: 4.1 mmol/L (ref 3.5–5.1)
Sodium: 140 mmol/L (ref 135–145)

## 2021-02-14 LAB — I-STAT BETA HCG BLOOD, ED (MC, WL, AP ONLY): I-stat hCG, quantitative: <5 m[IU]/mL (ref <5)

## 2021-02-14 MED ORDER — OXYCODONE-ACETAMINOPHEN 5-325 MG PO TABS: 1.0000 | ORAL_TABLET | Freq: Four times a day (QID) | ORAL | 0 refills | Status: DC | PRN

## 2021-02-14 MED ORDER — FENTANYL CITRATE PF 50 MCG/ML IJ SOSY
50.0000 ug | PREFILLED_SYRINGE | Freq: Once | INTRAMUSCULAR | Status: DC
  Filled 2021-02-14 (×2): qty 1

## 2021-02-14 MED ORDER — IBUPROFEN 800 MG PO TABS: 800.0000 mg | ORAL_TABLET | Freq: Three times a day (TID) | ORAL | 0 refills | Status: DC | PRN

## 2021-02-14 MED ORDER — METHOCARBAMOL 500 MG PO TABS: 500.0000 mg | ORAL_TABLET | Freq: Two times a day (BID) | ORAL | 0 refills | Status: DC | PRN

## 2021-02-14 MED ORDER — OXYCODONE-ACETAMINOPHEN 5-325 MG PO TABS
1.0000 | ORAL_TABLET | Freq: Once | ORAL | Status: AC
Start: 2021-02-14 — End: 2021-02-14
  Administered 2021-02-14: 1 via ORAL
  Filled 2021-02-14: qty 1

## 2021-02-14 MED ORDER — KETOROLAC TROMETHAMINE 30 MG/ML IJ SOLN
30.0000 mg | Freq: Once | INTRAMUSCULAR | Status: DC
  Filled 2021-02-14 (×2): qty 1

## 2021-02-14 MED ORDER — IBUPROFEN 800 MG PO TABS
800.0000 mg | ORAL_TABLET | Freq: Once | ORAL | Status: AC
  Administered 2021-02-14: 800 mg via ORAL
  Filled 2021-02-14: qty 1

## 2021-02-14 NOTE — ED Provider Notes (Signed)
[facility] DEPT Provider Note   CSN: [id number] Arrival date & time: 02/14/21  1508     History Chief Complaint  Patient presents with   Flank Pain    [name] is a 55 y.o. female.  She is here with complaint of right flank pain radiating to her right abdomen that started 2 days ago.  Getting progressively worse.  Worse with bending twisting taking a deep breath.  She also had 1 episode of vomiting.  She has noticed her urine has been darker and having urinary frequency.  No known fevers or chills.  No cough or shortness of breath.  No known trauma.  Works as a Recruitment consultant  The history is provided by the patient.  Flank Pain This is a new problem. The current episode started 2 days ago. The problem occurs constantly. The problem has been gradually worsening. Associated symptoms include abdominal pain. Pertinent negatives include no chest pain, no headaches and no shortness of breath. The symptoms are aggravated by bending and twisting. Nothing relieves the symptoms. She has tried nothing for the symptoms. The treatment provided no relief.      Past Medical History:  Diagnosis Date   Anemia    Fibroid    Hyperlipidemia    NSTEMI (non-ST elevated myocardial infarction) ([place]) 06/2017   s/p DES to LCX 07/02/17   Sarcoidosis     Patient Active Problem List   Diagnosis Date Noted   Flu-like symptoms 06/17/2018   Cough 06/17/2018   Lower respiratory infection 06/17/2018   Chest pain, rule out acute myocardial infarction 12/20/2017   CAD (coronary artery disease) 12/20/2017   Lung nodule, multiple 07/04/2017   Post PTCA 07/04/2017   Abnormal EKG    Acute chest pain    Femoral artery hematoma complicating cardiac catheterization 07/02/2017   Hyperlipidemia with target LDL less than 70 07/02/2017   Tobacco abuse 07/02/2017   NSTEMI (non-ST elevated myocardial infarction) ([place])    Knee LCL sprain 03/20/2017   Rectal bleeding 10/18/2016    Special screening for malignant neoplasms, colon 10/18/2016   Vitamin D deficiency 04/16/2015    Past Surgical History:  Procedure Laterality Date   ABDOMINAL HYSTERECTOMY     BREAST BIOPSY Right 03/24/2018   BREAST REDUCTION SURGERY Bilateral 10/17/2020   Procedure: MAMMARY REDUCTION  (BREAST);  Surgeon: Cindra Presume, MD;  Location: [facility];  Service: Plastics;  Laterality: Bilateral;  2 hours   CESAREAN SECTION  1987   CORONARY/GRAFT ACUTE MI REVASCULARIZATION N/A 06/30/2017   Procedure: Coronary/Graft Acute MI Revascularization;  Surgeon: Lorretta Harp, MD;  Location: [facility];  Service: Cardiovascular;  Laterality: N/A;   FOREARM FRACTURE SURGERY Right 1977   FRACTURE SURGERY     LEFT HEART CATH AND CORONARY ANGIOGRAPHY N/A 06/30/2017   Procedure: LEFT HEART CATH AND CORONARY ANGIOGRAPHY;  Surgeon: Lorretta Harp, MD;  Location: [facility];  Service: Cardiovascular;  Laterality: N/A;   LEFT HEART CATH AND CORONARY ANGIOGRAPHY N/A 12/23/2017   Procedure: LEFT HEART CATH AND CORONARY ANGIOGRAPHY;  Surgeon: Lorretta Harp, MD;  Location: [facility];  Service: Cardiovascular;  Laterality: N/A;   TUBAL LIGATION  1990     OB History   No obstetric history on file.     Family History  Problem Relation Age of Onset   Diabetes Mother    Stroke Mother 97   Hypertension Mother    Hyperlipidemia Father    Stroke  Father 44   Hypertension Father    Breast cancer Paternal Grandmother     Social History   Tobacco Use   Smoking status: Former    Packs/day: 1.00    Years: 29.00    Pack years: 29.00    Types: Cigarettes    Quit date: 03/19/2018    Years since quitting: 2.9   Smokeless tobacco: Never  Vaping Use   Vaping Use: Never used  Substance Use Topics   Alcohol use: Yes    Comment: 12/20/2017 "couple drinks once/yr"   Drug use: Never    Home Medications Prior to Admission medications   Medication Sig Start Date End Date Taking?  Authorizing Provider  APPLE CIDER VINEGAR PO Take 2 each by mouth daily. [place]    [provider]  aspirin EC 81 MG tablet Take 1 tablet (81 mg total) by mouth daily. Swallow whole. 12/04/19   Lorretta Harp, MD  atorvastatin (LIPITOR) 20 MG tablet Take 20 mg by mouth 2 (two) times daily. 12/23/20   [provider]  Biotin 1000 MCG CHEW Chew 2,000 mcg by mouth daily.    [provider]  cholecalciferol (VITAMIN D3) 25 MCG (1000 UNIT) tablet Take 2,000 Units by mouth daily. Gummies    [provider]    Allergies    Hydrocodone  Review of Systems   Review of Systems  Constitutional:  Negative for fever.  HENT:  Negative for sore throat.   Eyes:  Negative for visual disturbance.  Respiratory:  Negative for shortness of breath.   Cardiovascular:  Negative for chest pain.  Gastrointestinal:  Positive for abdominal pain, nausea and vomiting.  Genitourinary:  Positive for flank pain and frequency.  Musculoskeletal:  Positive for back pain.  Skin:  Negative for rash.  Neurological:  Negative for headaches.   Physical Exam Updated Vital Signs BP (!) 150/88 (BP Location: Left Arm)   Pulse 83   Temp 98.4 F (36.9 C) (Oral)   Resp 18   Ht 5' 4"  (1.626 m)   Wt 90.7 kg   SpO2 100%   BMI 34.33 kg/m   Physical Exam Vitals and nursing note reviewed.  Constitutional:      General: She is not in acute distress.    Appearance: Normal appearance. She is well-developed.  HENT:     Head: Normocephalic and atraumatic.  Eyes:     Conjunctiva/sclera: Conjunctivae normal.  Cardiovascular:     Rate and Rhythm: Normal rate and regular rhythm.     Heart sounds: No murmur heard. Pulmonary:     Effort: Pulmonary effort is normal. No respiratory distress.     Breath sounds: Normal breath sounds.  Abdominal:     Palpations: Abdomen is soft.     Tenderness: There is no abdominal tenderness. There is no guarding or rebound.  Musculoskeletal:        General:  Tenderness present. Normal range of motion.     Cervical back: Neck supple.     Comments: She has no midline thoracic or lumbar tenderness.  She is tender throughout her right flank and right lower back.  Skin:    General: Skin is warm and dry.  Neurological:     General: No focal deficit present.     Mental Status: She is alert.    ED Results / Procedures / Treatments   Labs (all labs ordered are listed, but only abnormal results are displayed) Labs Reviewed  HEPATIC FUNCTION PANEL - Abnormal; Notable for  the following components:      Result Value   Alkaline Phosphatase 132 (*)    All other components within normal limits  URINALYSIS, ROUTINE W REFLEX MICROSCOPIC  BASIC METABOLIC PANEL  CBC  I-STAT BETA HCG BLOOD, ED ([facility], [facility], [facility] ONLY)    EKG None  Radiology CT Renal Stone Study  Result Date: 02/14/2021 CLINICAL DATA:  right flank pain and urgency for 2 days-no history of kidney stones EXAM: CT ABDOMEN AND PELVIS WITHOUT CONTRAST TECHNIQUE: Multidetector CT imaging of the abdomen and pelvis was performed following the standard protocol without IV contrast. COMPARISON:  January 6, 20119. FINDINGS: Lower chest: No acute abnormality.  Small hiatal hernia Hepatobiliary: No focal liver abnormality is seen. No gallstones, gallbladder wall thickening, or biliary dilatation. Pancreas: No pancreatic ductal dilatation or surrounding inflammatory changes. Spleen: Normal in size without focal abnormality. Adrenals/Urinary Tract: Adrenal glands are unremarkable. No hydronephrosis. Nonobstructive 4 mm right interpolar renal calculus. No obstructive ureteral or bladder calculus visualized. Right renal scarring. Bladder is unremarkable. Stomach/Bowel: Stomach is within normal limits. Appendix appears normal. Scattered colonic diverticulosis without findings of acute diverticulitis. No evidence of bowel wall thickening, distention, or inflammatory changes. Vascular/Lymphatic: Aortic atherosclerosis. No  pathologically enlarged abdominal or pelvic lymph nodes. Reproductive: Status post hysterectomy. No adnexal masses. Other: No abdominal wall hernia or abnormality. No abdominopelvic ascites. Musculoskeletal: No acute osseous findings.  Lumbar spondylosis. IMPRESSION: 1. No acute abdominopelvic findings. 2. Nonobstructive 4 mm right interpolar renal calculus. No obstructive ureteral or bladder calculus visualized. 3. Scattered colonic diverticulosis without findings of acute diverticulitis. 4. Small hiatal hernia. 5.  Aortic Atherosclerosis (ICD10-I70.0). Electronically Signed   By: Dahlia Bailiff M.D.   On: 02/14/2021 16:47    Procedures Procedures   Medications Ordered in ED Medications  ibuprofen (ADVIL) tablet 800 mg (800 mg Oral Given 02/14/21 1955)  oxyCODONE-acetaminophen (PERCOCET/ROXICET) 5-325 MG per tablet 1 tablet (1 tablet Oral Given 02/14/21 1955)    ED Course  I have reviewed the triage vital signs and the nursing notes.  Pertinent labs & imaging results that were available during my care of the patient were reviewed by me and considered in my medical decision making (see chart for details).  Clinical Course as of 02/15/21 0947  Tue Feb 14, 2021  1941 The patient pain medicine and Toradol, unfortunately she is a hard stick and they were unable to get off medications.  Her urine and LFTs came back fairly unremarkable.  Alk phos is been elevated before.  Reviewed results with her.  She is comfortable plan for some oral pain medication and outpatient follow-up with her provider in the next few days.  Note for work. [MB]    Clinical Course User Index [MB] Hayden Rasmussen, MD   MDM Rules/Calculators/A&P                          This patient complains of right flank pain and urinary urgency; this involves an extensive number of treatment Options and is a complaint that carries with it a high risk of complications and Morbidity. The differential includes renal colic, pyelonephritis,  cholelithiasis, musculoskeletal  I ordered, reviewed and interpreted labs, which included CBC with normal white count normal hemoglobin, chemistries normal, urinalysis unremarkable, LFTs normal other than alk phos elevated similar to priors, pregnancy test negative I ordered medication oral pain medication I ordered imaging studies which included CT renal and I independently    visualized and interpreted imaging  which showed no acute findings Previous records obtained and reviewed in epic no recent admissions  After the interventions stated above, I reevaluated the patient and found patient's pain to be somewhat improved.  Reviewed results with her.  Recommended close follow-up with PCP.  Return instructions discussed   Final Clinical Impression(s) / ED Diagnoses Final diagnoses:  Right flank pain    Rx / DC Orders ED Discharge Orders          Ordered    oxyCODONE-acetaminophen (PERCOCET/ROXICET) 5-325 MG tablet  Every 6 hours PRN        02/14/21 1943    methocarbamol (ROBAXIN) 500 MG tablet  2 times daily PRN        02/14/21 1943    ibuprofen (ADVIL) 800 MG tablet  Every 8 hours PRN        02/14/21 1943             Hayden Rasmussen, MD 02/15/21 [phone]

## 2021-02-14 NOTE — Discharge Instructions (Addendum)
You are seen in the emergency department for right-sided flank pain and frequent urination.  You had lab work urinalysis and a CAT scan of your abdomen and pelvis that did not show an obvious explanation for your symptoms.  This may be muscular and we are treating you with some pain medication and an muscle relaxant.  Please follow-up with your primary care doctor as scheduled.  Please do not operate any heavy machinery while on these medications.  Return to the emergency department if any worsening or concerning symptoms

## 2021-02-14 NOTE — ED Triage Notes (Signed)
Per pt, states right flank pain and urgency for 2 days-no history of kidney stones

## 2021-02-14 NOTE — ED Provider Notes (Signed)
Emergency Medicine Provider Triage Evaluation Note  [name] , a 55 y.o. female  was evaluated in triage.  Pt complains of right flank and abdominal pain that started about 2 days ago.  Symptoms have been progressively worsening.  Reports urinary urgency as well as darker urine than normal.  No dysuria.  No known history of kidney stones.  She reports an episode of nausea/vomiting last night.  Physical Exam  BP (!) 155/123 (BP Location: Left Arm)   Pulse 84   Temp 98.4 F (36.9 C) (Oral)   Resp 18   Ht '5\' 4"'$  (1.626 m)   Wt 90.7 kg   SpO2 100%   BMI 34.33 kg/m  Gen:   Awake, no distress   Resp:  Normal effort  MSK:   Moves extremities without difficulty  Other:  Right flank pain.  Moderate tenderness along the right central lateral abdomen.  Medical Decision Making  Medically screening exam initiated at 4:14 PM.  Appropriate orders placed.  [name] was informed that the remainder of the evaluation will be completed by another provider, this initial triage assessment does not replace that evaluation, and the importance of remaining in the ED until their evaluation is complete.   Rayna Sexton, PA-C 02/14/21 1615    Hayden Rasmussen, MD 02/15/21 [phone]

## 2021-02-16 ENCOUNTER — Ambulatory Visit: Payer: Commercial Managed Care - PPO | Admitting: Surgical

## 2021-03-01 ENCOUNTER — Encounter (HOSPITAL_COMMUNITY): Payer: Self-pay

## 2021-03-01 ENCOUNTER — Other Ambulatory Visit: Payer: Self-pay

## 2021-03-01 ENCOUNTER — Emergency Department (HOSPITAL_COMMUNITY): Payer: BLUE CROSS/BLUE SHIELD

## 2021-03-01 ENCOUNTER — Emergency Department (HOSPITAL_COMMUNITY)
Admission: EM | Admit: 2021-03-01 | Discharge: 2021-03-01 | Disposition: A | Discharge status: Home / Self Care | Payer: BLUE CROSS/BLUE SHIELD | Attending: Emergency Medicine | Admitting: Emergency Medicine

## 2021-03-01 DIAGNOSIS — M79604 Pain in right leg: Secondary | ICD-10-CM | POA: Diagnosis not present

## 2021-03-01 DIAGNOSIS — M5431 Sciatica, right side: Secondary | ICD-10-CM

## 2021-03-01 DIAGNOSIS — M545 Low back pain, unspecified: Secondary | ICD-10-CM | POA: Diagnosis present

## 2021-03-01 DIAGNOSIS — I2510 Atherosclerotic heart disease of native coronary artery without angina pectoris: Secondary | ICD-10-CM | POA: Insufficient documentation

## 2021-03-01 DIAGNOSIS — Z87891 Personal history of nicotine dependence: Secondary | ICD-10-CM | POA: Diagnosis not present

## 2021-03-01 DIAGNOSIS — M5441 Lumbago with sciatica, right side: Secondary | ICD-10-CM | POA: Diagnosis not present

## 2021-03-01 DIAGNOSIS — Z7982 Long term (current) use of aspirin: Secondary | ICD-10-CM | POA: Insufficient documentation

## 2021-03-01 LAB — URINALYSIS, ROUTINE W REFLEX MICROSCOPIC
Bilirubin Urine: NEGATIVE
Glucose, UA: NEGATIVE mg/dL
Hgb urine dipstick: NEGATIVE
Ketones, ur: NEGATIVE mg/dL
Leukocytes,Ua: NEGATIVE
Nitrite: NEGATIVE
Protein, ur: NEGATIVE mg/dL
Specific Gravity, Urine: 1.025 (ref 1.005–1.030)
pH: 6 (ref 5.0–8.0)

## 2021-03-01 MED ORDER — PREDNISONE 20 MG PO TABS
60.0000 mg | ORAL_TABLET | Freq: Once | ORAL | Status: AC
  Administered 2021-03-01: 60 mg via ORAL
  Filled 2021-03-01: qty 3

## 2021-03-01 MED ORDER — KETOROLAC TROMETHAMINE 15 MG/ML IJ SOLN
15.0000 mg | Freq: Once | INTRAMUSCULAR | Status: AC
  Administered 2021-03-01: 15 mg via INTRAMUSCULAR
  Filled 2021-03-01: qty 1

## 2021-03-01 MED ORDER — ACETAMINOPHEN 325 MG PO TABS
650.0000 mg | ORAL_TABLET | Freq: Once | ORAL | Status: AC
  Administered 2021-03-01: 650 mg via ORAL
  Filled 2021-03-01: qty 2

## 2021-03-01 MED ORDER — OXYCODONE-ACETAMINOPHEN 5-325 MG PO TABS
1.0000 | ORAL_TABLET | Freq: Once | ORAL | Status: AC
  Administered 2021-03-01: 1 via ORAL
  Filled 2021-03-01: qty 1

## 2021-03-01 NOTE — ED Triage Notes (Signed)
Drove self here . C/o lower back pain that radiates down rt leg. Seen at [facility] on 02/14/2021 for same thing however symptoms have become worse.
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
## 2021-03-01 NOTE — ED Provider Notes (Signed)
[facility] EMERGENCY DEPARTMENT Provider Note   CSN: [id number] Arrival date & time: 03/01/21  0401     History Chief Complaint  Patient presents with   Back Pain    [name] is a 55 y.o. female.  55 yo F with a chief complaints of right-sided low back pain.  This is been going on for few weeks now.  Has been seen in an emergency department as well as her family doctor's office.  She feels like the pain has worsened and now radiates further down the leg than it did before.  Worse with standing movement and palpation.  She does feel like she is having some urinary hesitancy with this.  Feels like she has to concentrate on starting her stream.  Denies loss of bowel denies loss of peritoneal sensation denies numbness to the leg.  It does hurt her to move the leg and so she feels that it may be less strong than it was before.  She denies trauma denies history of cancer denies IV drug abuse.   Back Pain Associated symptoms: no chest pain, no dysuria, no fever and no headaches       Past Medical History:  Diagnosis Date   Anemia    Fibroid    Hyperlipidemia    NSTEMI (non-ST elevated myocardial infarction) ([place]) 06/2017   s/p DES to LCX 07/02/17   Sarcoidosis     Patient Active Problem List   Diagnosis Date Noted   Flu-like symptoms 06/17/2018   Cough 06/17/2018   Lower respiratory infection 06/17/2018   Chest pain, rule out acute myocardial infarction 12/20/2017   CAD (coronary artery disease) 12/20/2017   Lung nodule, multiple 07/04/2017   Post PTCA 07/04/2017   Abnormal EKG    Acute chest pain    Femoral artery hematoma complicating cardiac catheterization 07/02/2017   Hyperlipidemia with target LDL less than 70 07/02/2017   Tobacco abuse 07/02/2017   NSTEMI (non-ST elevated myocardial infarction) ([place])    Knee LCL sprain 03/20/2017   Rectal bleeding 10/18/2016   Special screening for malignant neoplasms, colon 10/18/2016   Vitamin D  deficiency 04/16/2015    Past Surgical History:  Procedure Laterality Date   ABDOMINAL HYSTERECTOMY     BREAST BIOPSY Right 03/24/2018   BREAST REDUCTION SURGERY Bilateral 10/17/2020   Procedure: MAMMARY REDUCTION  (BREAST);  Surgeon: Cindra Presume, MD;  Location: [place];  Service: Plastics;  Laterality: Bilateral;  2 hours   CESAREAN SECTION  1987   CORONARY/GRAFT ACUTE MI REVASCULARIZATION N/A 06/30/2017   Procedure: Coronary/Graft Acute MI Revascularization;  Surgeon: Lorretta Harp, MD;  Location: [place] [facility];  Service: Cardiovascular;  Laterality: N/A;   FOREARM FRACTURE SURGERY Right 1977   FRACTURE SURGERY     LEFT HEART CATH AND CORONARY ANGIOGRAPHY N/A 06/30/2017   Procedure: LEFT HEART CATH AND CORONARY ANGIOGRAPHY;  Surgeon: Lorretta Harp, MD;  Location:  [facility];  Service: Cardiovascular;  Laterality: N/A;   LEFT HEART CATH AND CORONARY ANGIOGRAPHY N/A 12/23/2017   Procedure: LEFT HEART CATH AND CORONARY ANGIOGRAPHY;  Surgeon: Lorretta Harp, MD;  Location: [place] [facility];  Service: Cardiovascular;  Laterality: N/A;   TUBAL LIGATION  1990     OB History   No obstetric history on file.     Family History  Problem Relation Age of Onset   Diabetes Mother    Stroke Mother 4   Hypertension Mother    Hyperlipidemia Father  Stroke Father 74   Hypertension Father    Breast cancer Paternal Grandmother     Social History   Tobacco Use   Smoking status: Former    Packs/day: 1.00    Years: 29.00    Pack years: 29.00    Types: Cigarettes    Quit date: 03/19/2018    Years since quitting: 2.9   Smokeless tobacco: Never  Vaping Use   Vaping Use: Never used  Substance Use Topics   Alcohol use: Yes    Comment: 12/20/2017 "couple drinks once/yr"   Drug use: Never    Home Medications Prior to Admission medications   Medication Sig Start Date End Date Taking? Authorizing Provider  APPLE CIDER VINEGAR PO Take 2 each by mouth daily. [place]     [provider]  aspirin EC 81 MG tablet Take 1 tablet (81 mg total) by mouth daily. Swallow whole. 12/04/19   Lorretta Harp, MD  atorvastatin (LIPITOR) 20 MG tablet Take 20 mg by mouth 2 (two) times daily. 12/23/20   [provider]  Biotin 1000 MCG CHEW Chew 2,000 mcg by mouth daily.    [provider]  cholecalciferol (VITAMIN D3) 25 MCG (1000 UNIT) tablet Take 2,000 Units by mouth daily. Gummies    [provider]  ibuprofen (ADVIL) 800 MG tablet Take 1 tablet (800 mg total) by mouth every 8 (eight) hours as needed. 02/14/21   Hayden Rasmussen, MD  methocarbamol (ROBAXIN) 500 MG tablet Take 1 tablet (500 mg total) by mouth 2 (two) times daily as needed for muscle spasms. 02/14/21   Hayden Rasmussen, MD  oxyCODONE-acetaminophen (PERCOCET/ROXICET) 5-325 MG tablet Take 1 tablet by mouth every 6 (six) hours as needed for severe pain. 02/14/21   Hayden Rasmussen, MD    Allergies    Hydrocodone  Review of Systems   Review of Systems  Constitutional:  Negative for chills and fever.  HENT:  Negative for congestion and rhinorrhea.   Eyes:  Negative for redness and visual disturbance.  Respiratory:  Negative for shortness of breath and wheezing.   Cardiovascular:  Negative for chest pain and palpitations.  Gastrointestinal:  Negative for nausea and vomiting.  Genitourinary:  Negative for dysuria and urgency.  Musculoskeletal:  Positive for back pain. Negative for arthralgias and myalgias.  Skin:  Negative for pallor and wound.  Neurological:  Negative for dizziness and headaches.   Physical Exam Updated Vital Signs BP (!) 122/103 (BP Location: Right Arm)   Pulse 69   Temp 98 F (36.7 C)   Resp 17   Ht 5\' 4"  (1.626 m)   Wt 88 kg   SpO2 100%   BMI 33.30 kg/m   Physical Exam Vitals and nursing note reviewed.  Constitutional:      General: She is not in acute distress.    Appearance: She is well-developed. She is not diaphoretic.  HENT:     Head:  Normocephalic and atraumatic.  Eyes:     Pupils: Pupils are equal, round, and reactive to light.  Cardiovascular:     Rate and Rhythm: Normal rate and regular rhythm.     Heart sounds: No murmur heard.   No friction rub. No gallop.  Pulmonary:     Effort: Pulmonary effort is normal.     Breath sounds: No wheezing or rales.  Abdominal:     General: There is no distension.     Palpations: Abdomen is soft.     Tenderness: There is  no abdominal tenderness.  Musculoskeletal:        General: Tenderness present.     Cervical back: Normal range of motion and neck supple.     Comments: Mild tenderness about the right side low back para musculature.  Pulse motor and sensation intact to the right lower extremity reflexes are 2+ and equal there is no clonus.  Skin:    General: Skin is warm and dry.  Neurological:     Mental Status: She is alert and oriented to person, place, and time.  Psychiatric:        Behavior: Behavior normal.    ED Results / Procedures / Treatments   Labs (all labs ordered are listed, but only abnormal results are displayed) Labs Reviewed  URINALYSIS, ROUTINE W REFLEX MICROSCOPIC    EKG None  Radiology MR LUMBAR SPINE WO CONTRAST  Result Date: 03/01/2021 CLINICAL DATA:  Low back pain for 3 weeks, progressive neurologic deficit EXAM: MRI LUMBAR SPINE WITHOUT CONTRAST TECHNIQUE: Multiplanar, multisequence MR imaging of the lumbar spine was performed. No intravenous contrast was administered. COMPARISON:  CT 02/14/2021 FINDINGS: Segmentation:  Standard. Alignment:  Physiologic. Vertebrae: No fracture, evidence of discitis, or suspicious bone lesion. There are a few scattered small intraosseous hemangiomas incidentally noted. Conus medullaris and cauda equina: Conus extends to the L1 level. Conus and cauda equina appear normal. Paraspinal and other soft tissues: Negative. Disc levels: T12-L1: No significant disc protrusion, foraminal stenosis, or canal stenosis. L1-L2:  No significant disc protrusion, foraminal stenosis, or canal stenosis. L2-L3: Minimal annular disc bulge. Mild bilateral facet arthropathy. No foraminal or canal stenosis. L3-L4: Diffuse disc bulge with left paracentral/foraminal protrusion. Slight cranial extension of disc material within the subarticular zone. Mild bilateral facet arthropathy. Findings result in mild canal stenosis with mild left subarticular recess stenosis and borderline-mild left foraminal stenosis. L4-L5: No disc protrusion. Mild bilateral facet arthropathy. No foraminal or canal stenosis. L5-S1: Shallow central disc protrusion. Mild bilateral facet arthropathy. No foraminal or canal stenosis. IMPRESSION: Mild degenerative changes of the lumbar spine, as described above. Findings are most pronounced at L3-L4 where there is mild canal stenosis, mild left subarticular recess stenosis, and borderline-mild left foraminal stenosis. Electronically Signed   By: Davina Poke D.O.   On: 03/01/2021 09:35    Procedures Procedures   Medications Ordered in ED Medications  acetaminophen (TYLENOL) tablet 650 mg (has no administration in time range)  ketorolac (TORADOL) 15 MG/ML injection 15 mg (has no administration in time range)  oxyCODONE-acetaminophen (PERCOCET/ROXICET) 5-325 MG per tablet 1 tablet (1 tablet Oral Given 03/01/21 0451)  predniSONE (DELTASONE) tablet 60 mg (60 mg Oral Given 03/01/21 0451)    ED Course  I have reviewed the triage vital signs and the nursing notes.  Pertinent labs & imaging results that were available during my care of the patient were reviewed by me and considered in my medical decision making (see chart for details).    MDM Rules/Calculators/A&P                           55 yo F with a chief complaints of right-sided low back pain that radiates down the leg.  Concern for now having urinary symptoms as well as some subjective leg weakness on that side and MRI was ordered in triage.  I do not  appreciate any weakness or numbness on my neuro exam.  UA is negative for infection.  We will treat her pain here.  Await MRI.  MRI with very mild stenosis.  Negative for cauda equina.  We will discharge the patient home.  PCP follow-up.  9:52 AM:  I have discussed the diagnosis/risks/treatment options with the patient and believe the pt to be eligible for discharge home to follow-up with PCP. We also discussed returning to the ED immediately if new or worsening sx occur. We discussed the sx which are most concerning (e.g., sudden worsening pain, fever, inability to tolerate by mouth, cauda equina s/sx) that necessitate immediate return. Medications administered to the patient during their visit and any new prescriptions provided to the patient are listed below.  Medications given during this visit Medications  acetaminophen (TYLENOL) tablet 650 mg (has no administration in time range)  ketorolac (TORADOL) 15 MG/ML injection 15 mg (has no administration in time range)  oxyCODONE-acetaminophen (PERCOCET/ROXICET) 5-325 MG per tablet 1 tablet (1 tablet Oral Given 03/01/21 0451)  predniSONE (DELTASONE) tablet 60 mg (60 mg Oral Given 03/01/21 0451)     The patient appears reasonably screen and/or stabilized for discharge and I doubt any other medical condition or other [facility] requiring further screening, evaluation, or treatment in the ED at this time prior to discharge.   Final Clinical Impression(s) / ED Diagnoses Final diagnoses:  Sciatica of right side    Rx / DC Orders ED Discharge Orders     None        Deno Etienne, DO 03/01/21 [phone]

## 2021-03-01 NOTE — Discharge Instructions (Signed)

## 2021-03-02 ENCOUNTER — Other Ambulatory Visit: Payer: Self-pay

## 2021-03-02 DIAGNOSIS — E785 Hyperlipidemia, unspecified: Secondary | ICD-10-CM

## 2021-03-09 ENCOUNTER — Other Ambulatory Visit: Payer: Self-pay | Admitting: Family Medicine

## 2021-03-09 DIAGNOSIS — N63 Unspecified lump in unspecified breast: Secondary | ICD-10-CM

## 2021-03-26 ENCOUNTER — Other Ambulatory Visit: Payer: Self-pay | Admitting: Cardiovascular Disease

## 2021-04-11 LAB — LIPID PANEL
Chol/HDL Ratio: 3.5 ratio (ref 0.0–4.4)
Cholesterol, Total: 118 mg/dL (ref 100–199)
HDL: 34 mg/dL — ABNORMAL LOW (ref >39)
LDL Chol Calc (NIH): 65 mg/dL (ref 0–99)
Triglycerides: 101 mg/dL (ref 0–149)
VLDL Cholesterol Cal: 19 mg/dL (ref 5–40)

## 2021-04-11 LAB — HEPATIC FUNCTION PANEL
ALT: 28 IU/L (ref 0–32)
AST: 25 IU/L (ref 0–40)
Albumin: 4.3 g/dL (ref 3.8–4.9)
Alkaline Phosphatase: 199 IU/L — ABNORMAL HIGH (ref 44–121)
Bilirubin Total: 0.6 mg/dL (ref 0.0–1.2)
Bilirubin, Direct: 0.17 mg/dL (ref 0.00–0.40)
Total Protein: 6.7 g/dL (ref 6.0–8.5)

## 2021-04-11 NOTE — Progress Notes (Signed)
Excellent cholesterol control, chronically low good cholesterol (HDL). Only way to raise HDL is through increased activity. Liver function tolerating the current dose of lipitor.

## 2021-05-20 ENCOUNTER — Encounter (HOSPITAL_BASED_OUTPATIENT_CLINIC_OR_DEPARTMENT_OTHER): Payer: Self-pay | Admitting: Emergency Medicine

## 2021-05-20 ENCOUNTER — Emergency Department (HOSPITAL_BASED_OUTPATIENT_CLINIC_OR_DEPARTMENT_OTHER)
Admission: EM | Admit: 2021-05-20 | Discharge: 2021-05-20 | Disposition: A | Discharge status: Home / Self Care | Payer: BLUE CROSS/BLUE SHIELD | Attending: Emergency Medicine | Admitting: Emergency Medicine

## 2021-05-20 ENCOUNTER — Other Ambulatory Visit: Payer: Self-pay

## 2021-05-20 ENCOUNTER — Emergency Department (HOSPITAL_BASED_OUTPATIENT_CLINIC_OR_DEPARTMENT_OTHER): Payer: BLUE CROSS/BLUE SHIELD

## 2021-05-20 DIAGNOSIS — Z79899 Other long term (current) drug therapy: Secondary | ICD-10-CM | POA: Diagnosis not present

## 2021-05-20 DIAGNOSIS — J209 Acute bronchitis, unspecified: Secondary | ICD-10-CM | POA: Diagnosis not present

## 2021-05-20 DIAGNOSIS — R059 Cough, unspecified: Secondary | ICD-10-CM | POA: Diagnosis present

## 2021-05-20 DIAGNOSIS — F17210 Nicotine dependence, cigarettes, uncomplicated: Secondary | ICD-10-CM | POA: Insufficient documentation

## 2021-05-20 DIAGNOSIS — I2510 Atherosclerotic heart disease of native coronary artery without angina pectoris: Secondary | ICD-10-CM | POA: Diagnosis not present

## 2021-05-20 DIAGNOSIS — Z20822 Contact with and (suspected) exposure to covid-19: Secondary | ICD-10-CM | POA: Insufficient documentation

## 2021-05-20 DIAGNOSIS — Z7982 Long term (current) use of aspirin: Secondary | ICD-10-CM | POA: Diagnosis not present

## 2021-05-20 LAB — COMPREHENSIVE METABOLIC PANEL
ALT: 43 U/L (ref 0–44)
AST: 46 U/L — ABNORMAL HIGH (ref 15–41)
Albumin: 3.8 g/dL (ref 3.5–5.0)
Alkaline Phosphatase: 139 U/L — ABNORMAL HIGH (ref 38–126)
Anion gap: 9 (ref 5–15)
BUN: 13 mg/dL (ref 6–20)
CO2: 23 mmol/L (ref 22–32)
Calcium: 9.9 mg/dL (ref 8.9–10.3)
Chloride: 102 mmol/L (ref 98–111)
Creatinine, Ser: 0.87 mg/dL (ref 0.44–1.00)
GFR, Estimated: >60 mL/min (ref >60)
Glucose, Bld: 121 mg/dL — ABNORMAL HIGH (ref 70–99)
Potassium: 3.7 mmol/L (ref 3.5–5.1)
Sodium: 134 mmol/L — ABNORMAL LOW (ref 135–145)
Total Bilirubin: 0.4 mg/dL (ref 0.3–1.2)
Total Protein: 6.8 g/dL (ref 6.5–8.1)

## 2021-05-20 LAB — CBC WITH DIFFERENTIAL/PLATELET
Abs Immature Granulocytes: 0.02 10*3/uL (ref 0.00–0.07)
Basophils Absolute: 0 10*3/uL (ref 0.0–0.1)
Basophils Relative: 1 %
Eosinophils Absolute: 0.1 10*3/uL (ref 0.0–0.5)
Eosinophils Relative: 2 %
HCT: 42.1 % (ref 36.0–46.0)
Hemoglobin: 14.2 g/dL (ref 12.0–15.0)
Immature Granulocytes: 0 %
Lymphocytes Relative: 40 %
Lymphs Abs: 1.8 10*3/uL (ref 0.7–4.0)
MCH: 29.2 pg (ref 26.0–34.0)
MCHC: 33.7 g/dL (ref 30.0–36.0)
MCV: 86.6 fL (ref 80.0–100.0)
Monocytes Absolute: 0.5 10*3/uL (ref 0.1–1.0)
Monocytes Relative: 11 %
Neutro Abs: 2.1 10*3/uL (ref 1.7–7.7)
Neutrophils Relative %: 46 %
Platelets: 294 10*3/uL (ref 150–400)
RBC: 4.86 MIL/uL (ref 3.87–5.11)
RDW: 13.2 % (ref 11.5–15.5)
WBC: 4.5 10*3/uL (ref 4.0–10.5)
nRBC: 0 % (ref 0.0–0.2)

## 2021-05-20 LAB — RESP PANEL BY RT-PCR (FLU A&B, COVID) ARPGX2
Influenza A by PCR: NEGATIVE
Influenza B by PCR: NEGATIVE
SARS Coronavirus 2 by RT PCR: NEGATIVE

## 2021-05-20 LAB — TROPONIN I (HIGH SENSITIVITY): Troponin I (High Sensitivity): 5 ng/L (ref <18)

## 2021-05-20 MED ORDER — ALBUTEROL SULFATE (2.5 MG/3ML) 0.083% IN NEBU: 2.5000 mg | INHALATION_SOLUTION | Freq: Once | RESPIRATORY_TRACT | Status: AC

## 2021-05-20 MED ORDER — ALBUTEROL SULFATE HFA 108 (90 BASE) MCG/ACT IN AERS
2.0000 | INHALATION_SPRAY | Freq: Once | RESPIRATORY_TRACT | Status: AC
  Administered 2021-05-20: 2 via RESPIRATORY_TRACT
  Filled 2021-05-20: qty 6.7

## 2021-05-20 MED ORDER — ACETAMINOPHEN 500 MG PO TABS
1000.0000 mg | ORAL_TABLET | Freq: Once | ORAL | Status: AC
  Administered 2021-05-20: 1000 mg via ORAL
  Filled 2021-05-20: qty 2

## 2021-05-20 MED ORDER — AEROCHAMBER PLUS FLO-VU MISC
1.0000 | Freq: Once | Status: AC
  Administered 2021-05-20: 1
  Filled 2021-05-20: qty 1

## 2021-05-20 MED ORDER — IPRATROPIUM-ALBUTEROL 0.5-2.5 (3) MG/3ML IN SOLN
RESPIRATORY_TRACT | Status: AC
  Administered 2021-05-20: 3 mL via RESPIRATORY_TRACT
  Filled 2021-05-20: qty 3

## 2021-05-20 MED ORDER — ALBUTEROL SULFATE (2.5 MG/3ML) 0.083% IN NEBU
INHALATION_SOLUTION | RESPIRATORY_TRACT | Status: AC
  Administered 2021-05-20: 2.5 mg via RESPIRATORY_TRACT
  Filled 2021-05-20: qty 3

## 2021-05-20 MED ORDER — IPRATROPIUM-ALBUTEROL 0.5-2.5 (3) MG/3ML IN SOLN: 3.0000 mL | Freq: Once | RESPIRATORY_TRACT | Status: AC

## 2021-05-20 MED ORDER — SODIUM CHLORIDE 0.9 % IV BOLUS
1000.0000 mL | Freq: Once | INTRAVENOUS | Status: AC
  Administered 2021-05-20: 1000 mL via INTRAVENOUS

## 2021-05-20 NOTE — ED Notes (Signed)
Pt ambulatory with steady gait, standby assist of family member to restroom. Pt will provide urine specimen

## 2021-05-20 NOTE — ED Provider Notes (Signed)
[place] EMERGENCY DEPT Provider Note   CSN: [id number] Arrival date & time: 05/20/21  6659     History Chief Complaint  Patient presents with   Chest Pain    [name] is a 55 y.o. female.  HPI 55 year old female presents with a chief complaint of cough and chest pain for about 4 days.  Cough has yellow sputum.  She also feels short of breath.  She been having chest pain in addition to full body aches for the last 3 or 4 days as well.  She is had some diarrhea and vomiting.  She denies fevers.  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  The chest pain is left upper chest.  Smokes about a pack of cigarettes a day.  Past Medical History:  Diagnosis Date   Anemia    Fibroid    Hyperlipidemia    NSTEMI (non-ST elevated myocardial infarction) ([place]) 06/2017   s/p DES to LCX 07/02/17   Sarcoidosis     Patient Active Problem List   Diagnosis Date Noted   Flu-like symptoms 06/17/2018   Cough 06/17/2018   Lower respiratory infection 06/17/2018   Chest pain, rule out acute myocardial infarction 12/20/2017   CAD (coronary artery disease) 12/20/2017   Lung nodule, multiple 07/04/2017   Post PTCA 07/04/2017   Abnormal EKG    Acute chest pain    Femoral artery hematoma complicating cardiac catheterization 07/02/2017   Hyperlipidemia with target LDL less than 70 07/02/2017   Tobacco abuse 07/02/2017   NSTEMI (non-ST elevated myocardial infarction) ([place])    Knee LCL sprain 03/20/2017   Rectal bleeding 10/18/2016   Special screening for malignant neoplasms, colon 10/18/2016   Vitamin D deficiency 04/16/2015    Past Surgical History:  Procedure Laterality Date   ABDOMINAL HYSTERECTOMY     BREAST BIOPSY Right 03/24/2018   BREAST REDUCTION SURGERY Bilateral 10/17/2020   Procedure: MAMMARY REDUCTION  (BREAST);  Surgeon: Cindra Presume, MD;  Location: [facility];  Service: Plastics;  Laterality: Bilateral;  2 hours   CESAREAN SECTION   1987   CORONARY/GRAFT ACUTE MI REVASCULARIZATION N/A 06/30/2017   Procedure: Coronary/Graft Acute MI Revascularization;  Surgeon: Lorretta Harp, MD;  Location: [place] [facility];  Service: Cardiovascular;  Laterality: N/A;   FOREARM FRACTURE SURGERY Right 1977   FRACTURE SURGERY     LEFT HEART CATH AND CORONARY ANGIOGRAPHY N/A 06/30/2017   Procedure: LEFT HEART CATH AND CORONARY ANGIOGRAPHY;  Surgeon: Lorretta Harp, MD;  Location: [place] [facility];  Service: Cardiovascular;  Laterality: N/A;   LEFT HEART CATH AND CORONARY ANGIOGRAPHY N/A 12/23/2017   Procedure: LEFT HEART CATH AND CORONARY ANGIOGRAPHY;  Surgeon: Lorretta Harp, MD;  Location: [place] [facility];  Service: Cardiovascular;  Laterality: N/A;   TUBAL LIGATION  1990     OB History   No obstetric history on file.     Family History  Problem Relation Age of Onset   Diabetes Mother    Stroke Mother 26   Hypertension Mother    Hyperlipidemia Father    Stroke Father 73   Hypertension Father    Breast cancer Paternal Grandmother     Social History   Tobacco Use   Smoking status: Some Days    Packs/day: 1.00    Years: 29.00    Pack years: 29.00    Types: Cigarettes    Last attempt to quit: 03/19/2018  Years since quitting: 3.1   Smokeless tobacco: Never  Vaping Use   Vaping Use: Never used  Substance Use Topics   Alcohol use: Yes    Comment: 12/20/2017 "couple drinks once/yr"   Drug use: Never    Home Medications Prior to Admission medications   Medication Sig Start Date End Date Taking? Authorizing Provider  APPLE CIDER VINEGAR PO Take 2 each by mouth daily. [place]    [provider]  aspirin EC 81 MG tablet Take 1 tablet (81 mg total) by mouth daily. Swallow whole. 12/04/19   Lorretta Harp, MD  atorvastatin (LIPITOR) 20 MG tablet TAKE 1 TABLET BY MOUTH EVERY DAY 03/27/21   Lorretta Harp, MD  Biotin 1000 MCG CHEW Chew 2,000 mcg by mouth daily.    [provider]   cholecalciferol (VITAMIN D3) 25 MCG (1000 UNIT) tablet Take 2,000 Units by mouth daily. Gummies    [provider]  ibuprofen (ADVIL) 800 MG tablet Take 1 tablet (800 mg total) by mouth every 8 (eight) hours as needed. 02/14/21   Hayden Rasmussen, MD  methocarbamol (ROBAXIN) 500 MG tablet Take 1 tablet (500 mg total) by mouth 2 (two) times daily as needed for muscle spasms. 02/14/21   Hayden Rasmussen, MD  oxyCODONE-acetaminophen (PERCOCET/ROXICET) 5-325 MG tablet Take 1 tablet by mouth every 6 (six) hours as needed for severe pain. 02/14/21   Hayden Rasmussen, MD    Allergies    Hydrocodone  Review of Systems   Review of Systems  Constitutional:  Negative for fever.  Respiratory:  Positive for cough and shortness of breath.   Cardiovascular:  Positive for chest pain. Negative for leg swelling.  Musculoskeletal:  Positive for myalgias.  All other systems reviewed and are negative.  Physical Exam Updated Vital Signs BP 133/81 (BP Location: Right Arm)   Pulse 91   Temp 98.6 F (37 C) (Oral)   Resp (!) 24   Ht 5\' 4"  (1.626 m)   Wt 89.8 kg   SpO2 100%   BMI 33.99 kg/m   Physical Exam Vitals and nursing note reviewed.  Constitutional:      Appearance: She is well-developed.  HENT:     Head: Normocephalic and atraumatic.     Right Ear: External ear normal.     Left Ear: External ear normal.     Nose: Nose normal.  Eyes:     General:        Right eye: No discharge.        Left eye: No discharge.  Cardiovascular:     Rate and Rhythm: Normal rate and regular rhythm.     Heart sounds: Normal heart sounds.  Pulmonary:     Effort: Pulmonary effort is normal.     Breath sounds: Normal breath sounds.     Comments: Perhaps mild intermittent wheezing Chest:     Chest wall: Tenderness (Left upper chest - examined with nurse present) present.  Abdominal:     Palpations: Abdomen is soft.     Tenderness: There is no abdominal tenderness.  Skin:    General: Skin is warm and  dry.  Neurological:     Mental Status: She is alert.  Psychiatric:        Mood and Affect: Mood is not anxious.    ED Results / Procedures / Treatments   Labs (all labs ordered are listed, but only abnormal results are displayed) Labs Reviewed  COMPREHENSIVE METABOLIC PANEL - Abnormal; Notable for  the following components:      Result Value   Sodium 134 (*)    Glucose, Bld 121 (*)    AST 46 (*)    Alkaline Phosphatase 139 (*)    All other components within normal limits  RESP PANEL BY RT-PCR (FLU A&B, COVID) ARPGX2  CBC WITH DIFFERENTIAL/PLATELET  TROPONIN I (HIGH SENSITIVITY)    EKG EKG Interpretation  Date/Time:  Saturday May 20 2021 10:02:27 EST Ventricular Rate:  86 PR Interval:  164 QRS Duration: 68 QT Interval:  322 QTC Calculation: 386 R Axis:   38 Text Interpretation: Sinus rhythm Probable left atrial enlargement Low voltage, precordial leads Anteroseptal infarct, old  T wave changes similar to mar 2020 Confirmed by Sherwood Gambler [phone]) on 05/20/2021 10:27:35 AM  Radiology DG Chest Portable 1 View  Result Date: 05/20/2021 CLINICAL DATA:  Cough, chest pain EXAM: PORTABLE CHEST 1 VIEW COMPARISON:  None. FINDINGS: The heart size and mediastinal contours are within normal limits. Both lungs are clear. No pleural effusion or pneumothorax. The visualized skeletal structures are unremarkable. IMPRESSION: No active disease. Electronically Signed   By: Macy Mis M.D.   On: 05/20/2021 10:51    Procedures Procedures   Medications Ordered in ED Medications  albuterol (PROVENTIL) (2.5 MG/3ML) 0.083% nebulizer solution 2.5 mg (2.5 mg Nebulization Given 05/20/21 1017)  ipratropium-albuterol (DUONEB) 0.5-2.5 (3) MG/3ML nebulizer solution 3 mL (3 mLs Nebulization Given 05/20/21 1017)  sodium chloride 0.9 % bolus 1,000 mL ( Intravenous Stopped 05/20/21 1137)  acetaminophen (TYLENOL) tablet 1,000 mg (1,000 mg Oral Given 05/20/21 1028)  albuterol (VENTOLIN HFA) 108  (90 Base) MCG/ACT inhaler 2 puff (2 puffs Inhalation Given 05/20/21 1210)  aerochamber plus with mask device 1 each (1 each Other Given 05/20/21 1211)    ED Course  I have reviewed the triage vital signs and the nursing notes.  Pertinent labs & imaging results that were available during my care of the patient were reviewed by me and considered in my medical decision making (see chart for details).    MDM Rules/Calculators/A&P                           Patient most likely is having acute bronchitis.  She does not have a known history of COPD but does smoke a pack a day.  Will refer to pulmonology and general but I think likely she can be treated with albuterol and supportive care.  I highly doubt ACS.  Doubt bacterial pneumonia, PE, ACS.  Vitals are stable.  She appears stable for discharge home. With minimal wheezing and no known COPD I don't think steroids are indicated. Final Clinical Impression(s) / ED Diagnoses Final diagnoses:  Acute bronchitis, unspecified organism    Rx / DC Orders ED Discharge Orders     None        Sherwood Gambler, MD 05/20/21 1218

## 2021-05-20 NOTE — ED Notes (Signed)
Patient educated on the use of MDI w/ spacer. Patient demonstrated good effort with both.

## 2021-05-20 NOTE — ED Triage Notes (Signed)
Pt from home complains of chest pain that radiates toward her left shoulder. Pt is unsure if the chest pain related to her cough or not. Pt stated that she has had a MI in the past and the pain is similar. Pt also complains of productive cough with diarrhea and generalized body aches.

## 2021-05-20 NOTE — Discharge Instructions (Addendum)
Use the albuterol inhaler 1-2 puffs every 4 hours as needed for cough, shortness of breath, or wheezing.  If he needed significantly more than this or you develop new or worsening shortness of breath, start coughing up blood, develop a fever, new or worsening chest pain, or any other new/concerning symptoms then return to the ER or call 911.

## 2021-07-17 ENCOUNTER — Encounter (HOSPITAL_BASED_OUTPATIENT_CLINIC_OR_DEPARTMENT_OTHER): Payer: Self-pay | Admitting: Emergency Medicine

## 2021-07-17 ENCOUNTER — Other Ambulatory Visit: Payer: Self-pay

## 2021-07-17 ENCOUNTER — Emergency Department (HOSPITAL_BASED_OUTPATIENT_CLINIC_OR_DEPARTMENT_OTHER)
Admission: EM | Admit: 2021-07-17 | Discharge: 2021-07-17 | Disposition: A | Discharge status: Home / Self Care | Payer: Commercial Managed Care - PPO | Attending: Emergency Medicine | Admitting: Emergency Medicine

## 2021-07-17 DIAGNOSIS — L500 Allergic urticaria: Secondary | ICD-10-CM | POA: Diagnosis not present

## 2021-07-17 DIAGNOSIS — T7840XA Allergy, unspecified, initial encounter: Secondary | ICD-10-CM

## 2021-07-17 DIAGNOSIS — R21 Rash and other nonspecific skin eruption: Secondary | ICD-10-CM | POA: Diagnosis present

## 2021-07-17 LAB — CBC
HCT: 40.8 % (ref 36.0–46.0)
Hemoglobin: 13.6 g/dL (ref 12.0–15.0)
MCH: 28.8 pg (ref 26.0–34.0)
MCHC: 33.3 g/dL (ref 30.0–36.0)
MCV: 86.4 fL (ref 80.0–100.0)
Platelets: 337 10*3/uL (ref 150–400)
RBC: 4.72 MIL/uL (ref 3.87–5.11)
RDW: 13 % (ref 11.5–15.5)
WBC: 7.5 10*3/uL (ref 4.0–10.5)
nRBC: 0 % (ref 0.0–0.2)

## 2021-07-17 LAB — BASIC METABOLIC PANEL
Anion gap: 7 (ref 5–15)
BUN: 8 mg/dL (ref 6–20)
CO2: 26 mmol/L (ref 22–32)
Calcium: 10.5 mg/dL — ABNORMAL HIGH (ref 8.9–10.3)
Chloride: 106 mmol/L (ref 98–111)
Creatinine, Ser: 0.8 mg/dL (ref 0.44–1.00)
GFR, Estimated: >60 mL/min (ref >60)
Glucose, Bld: 103 mg/dL — ABNORMAL HIGH (ref 70–99)
Potassium: 4.2 mmol/L (ref 3.5–5.1)
Sodium: 139 mmol/L (ref 135–145)

## 2021-07-17 MED ORDER — FAMOTIDINE IN NACL 20-0.9 MG/50ML-% IV SOLN
20.0000 mg | Freq: Once | INTRAVENOUS | Status: AC
  Administered 2021-07-17: 20 mg via INTRAVENOUS
  Filled 2021-07-17: qty 50

## 2021-07-17 MED ORDER — DIPHENHYDRAMINE HCL 50 MG/ML IJ SOLN
25.0000 mg | Freq: Once | INTRAMUSCULAR | Status: AC
  Administered 2021-07-17: 25 mg via INTRAVENOUS
  Filled 2021-07-17: qty 1

## 2021-07-17 MED ORDER — METHYLPREDNISOLONE SODIUM SUCC 125 MG IJ SOLR
125.0000 mg | Freq: Once | INTRAMUSCULAR | Status: AC
  Administered 2021-07-17: 125 mg via INTRAVENOUS
  Filled 2021-07-17: qty 2

## 2021-07-17 MED ORDER — DIPHENHYDRAMINE HCL 25 MG PO CAPS
50.0000 mg | ORAL_CAPSULE | Freq: Once | ORAL | Status: DC
  Filled 2021-07-17: qty 2

## 2021-07-17 MED ORDER — PREDNISONE 10 MG (21) PO TBPK: ORAL_TABLET | Freq: Every day | ORAL | 0 refills | Status: DC

## 2021-07-17 NOTE — ED Triage Notes (Signed)
Pt arrives to ED with c/o allergic reaction. Pt reports that she started to have itching on her feet and hands yesterday. Today the itching is generalized whole body. She denies rash/hives. She does report she is on Penicillin for pulled tooth currently.

## 2021-07-17 NOTE — Discharge Instructions (Addendum)
You are seen in the ER today for your allergic reaction.  This improved with medications through IV in the ER.  Please stop taking the penicillin at home.  You have been prescribed a steroid taper to take for the next week.  Please take this as prescribed for entire course.  Additionally please take Benadryl around-the-clock for the next 2 days and over-the-counter Pepcid.  After 2 days you may continue taking the Pepcid for completion of 5 days and may start taking cetirizine or loratadine (over-the-counter allergy medicines) for the following 5 days as well.  Follow-up with your primary care doctor and return to the ER with any new severe symptoms.

## 2021-07-17 NOTE — ED Provider Notes (Signed)
[facility] EMERGENCY DEPT Provider Note   CSN: [id number] Arrival date & time: 07/17/21  1445     History Chief Complaint  Patient presents with   Allergic Reaction    [name] is a 56 y.o. female who presents with concern for itching and rash to her legs and arms bilaterally and itching on her back started since she was placed on penicillin following recent tooth extraction.  She was told she supposed be taking the antibiotic 4 times a day but had not been doing so, but noticed that she began to develop a rash even though she was taking the antibiotic less frequently than prescribed.  She states that she began to take it 4 times a day as prescribed and had significant worsening of her rash.  She does endorse hives on her legs which have now improved.  She has not taken any medication at home.  She denies any numbness, tingling, weakness in her arms or legs.  Denies any swelling, tingling, paresthesias in her mouth, difficulty swallowing, shortness of breath, or sore throat.  No history of anaphylactic reaction in the past.  Does state that she has had similar reaction to days with hives to hydrocodone before.  Patient arrives with her female partner at the bedside with whom she lives.  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 HPI     Home Medications Prior to Admission medications   Medication Sig Start Date End Date Taking? Authorizing Provider  APPLE CIDER VINEGAR PO Take 2 each by mouth daily. Goli    [provider]  aspirin EC 81 MG tablet Take 1 tablet (81 mg total) by mouth daily. Swallow whole. 12/04/19   Lorretta Harp, MD  atorvastatin (LIPITOR) 20 MG tablet TAKE 1 TABLET BY MOUTH EVERY DAY 03/27/21   Lorretta Harp, MD  Biotin 1000 MCG CHEW Chew 2,000 mcg by mouth daily.    [provider]  cholecalciferol (VITAMIN D3) 25 MCG (1000 UNIT) tablet Take  2,000 Units by mouth daily. Gummies    [provider]  ibuprofen (ADVIL) 800 MG tablet Take 1 tablet (800 mg total) by mouth every 8 (eight) hours as needed. 02/14/21   Hayden Rasmussen, MD  methocarbamol (ROBAXIN) 500 MG tablet Take 1 tablet (500 mg total) by mouth 2 (two) times daily as needed for muscle spasms. 02/14/21   Hayden Rasmussen, MD  oxyCODONE-acetaminophen (PERCOCET/ROXICET) 5-325 MG tablet Take 1 tablet by mouth every 6 (six) hours as needed for severe pain. 02/14/21   Hayden Rasmussen, MD      Allergies    Hydrocodone    Review of Systems   Review of Systems  Constitutional: Negative.  Negative for appetite change.  HENT: Negative.    Respiratory: Negative.    Cardiovascular: Negative.   Gastrointestinal: Negative.   Genitourinary: Negative.   Musculoskeletal: Negative.   Skin:  Positive for rash.  Neurological: Negative.   Hematological: Negative.   Psychiatric/Behavioral: Negative.     Physical Exam Updated Vital Signs BP (!) 133/105 (BP Location: Right Arm)    Pulse (!) 106    Temp 97.9 F (36.6 C) (Temporal)    Resp 16    Ht 5\' 4"  (1.626 m)    Wt 90.7 kg    SpO2 98%    BMI 34.33 kg/m  Physical Exam Vitals and nursing note reviewed.  Constitutional:  Appearance: She is not ill-appearing or toxic-appearing.  HENT:     Head: Normocephalic and atraumatic.     Nose: Nose normal.     Mouth/Throat:     Mouth: Mucous membranes are moist. No lacerations, oral lesions or angioedema.     Pharynx: Oropharynx is clear. Uvula midline. No oropharyngeal exudate or posterior oropharyngeal erythema.     Tonsils: No tonsillar exudate.     Comments: No sublingual or submental tenderness to palpation. Eyes:     General:        Right eye: No discharge.        Left eye: No discharge.     Extraocular Movements: Extraocular movements intact.     Conjunctiva/sclera: Conjunctivae normal.     Pupils: Pupils are equal, round, and reactive to light.  Cardiovascular:      Rate and Rhythm: Normal rate and regular rhythm.     Pulses: Normal pulses.     Heart sounds: Normal heart sounds. No murmur heard. Pulmonary:     Effort: Pulmonary effort is normal. No respiratory distress.     Breath sounds: Normal breath sounds. No wheezing or rales.  Abdominal:     General: Bowel sounds are normal. There is no distension.     Palpations: Abdomen is soft.     Tenderness: There is no abdominal tenderness. There is no right CVA tenderness, left CVA tenderness, guarding or rebound.  Musculoskeletal:        General: No deformity.     Cervical back: Neck supple.     Right lower leg: No edema.     Left lower leg: No edema.  Skin:    General: Skin is warm and dry.     Capillary Refill: Capillary refill takes less than 2 seconds.     Findings: Rash present. Rash is urticarial.     Comments: Urticarial rash of the bilateral medial thighs.  Erythema extending down the anterior and posterior lower legs as well as the dorsum of bilateral arms and the lower back without urticaria.  Patient seen to be itching this rash aggressively throughout exam  Neurological:     General: No focal deficit present.     Mental Status: She is alert and oriented to person, place, and time. Mental status is at baseline.  Psychiatric:        Mood and Affect: Mood normal.    ED Results / Procedures / Treatments   Labs (all labs ordered are listed, but only abnormal results are displayed) Labs Reviewed - No data to display  EKG None  Radiology No results found.  Procedures Procedures    Medications Ordered in ED Medications - No data to display  ED Course/ Medical Decision Making/ A&P Clinical Course as of 07/17/21 1812  Mon Jul 17, 2021  1811 Patient reevaluated with improvement in her itchiness.  She is found to be resting comfortably underneath some blankets.  She does still state that the rash is itchy all over her body but is no longer scratching.  She states that immediately  prior to administration of medication she did have some minor tingling of her lower lip which she did not tell the nurse or this provider about.  She states that it is improving at this time and has not had any progression of her symptoms.  Airway remains intact, no indication for epinephrine at this time.  She is continuing to receive Pepcid and has also received Benadryl and Solu-Medrol.  We will continue to  monitor. [RS]    Clinical Course User Index [RS] Nike Southwell, Gypsy Balsam, PA-C                           Medical Decision Making 56 year old female presents with concern for allergic reaction to penicillin.  Hypertensive and tachycardic on intake.  Cardiopulmonary and abdominal exams are benign.  Patient is neurovascular intact all 4 extremities though she does have urticarial rash on the bilateral thighs with erythema and resolving urticaria in the bilateral lower legs.  Additionally erythema and apparent itchiness to the dorsum of both arms.  No oral lesions, angioedema, or sublingual or submental tenderness to palpation.  No anterior neck swelling or crepitus.  Physical exam and HPI most consistent with acute allergic reaction to penicillin most recently dosed this morning.  Amount and/or Complexity of Data Reviewed Labs: ordered.    Details: CBC and BMP unremarkable.  Risk Prescription drug management.  Patient reevaluated significant improvement in her itching and complete resolution of the tingling in her lips.  Do suspect she had an acute allergic reaction likely to penicillin as that is the only noted change in intake for her.  Recommend antihistamine therapy over the next few days with Pepcid and Benadryl or Zyrtec.  Additionally will prescribe steroid taper.  Oropharyngeal exam is unremarkable, airway is intact and vital signs are reassuring.  No further work-up warranted in ER at this time.  Close follow-up with her primary care doctor is recommended.  Cessation of all penicillin  also recommended.  Dannah voiced understanding of her medical evaluation and treatment plan.  Due to for questions was answered to her expressed satisfaction.  Strict return precautions were given.  Patient is well-appearing, stable, and was discharged in good condition.  This chart was dictated using voice recognition software, Dragon. Despite the best efforts of this provider to proofread and correct errors, errors may still occur which can change documentation meaning.    Final Clinical Impression(s) / ED Diagnoses Final diagnoses:  None    Rx / DC Orders ED Discharge Orders     None         Aura Dials 07/17/21 2324    Hayden Rasmussen, MD 07/18/21 [phone]

## 2021-11-08 NOTE — Progress Notes (Unsigned)
Office Visit    Patient Name: [name] Date of Encounter: 11/09/2021  Primary Care Provider:  Glendon Axe, MD Primary Cardiologist:  Quay Burow, MD  Chief Complaint    56 year old female with a history of CAD, hypertension, hyperlipidemia, sarcoidosis, and tobacco use who presents for follow-up related to CAD.  Past Medical History    Past Medical History:  Diagnosis Date   Anemia    Fibroid    Hyperlipidemia    NSTEMI (non-ST elevated myocardial infarction) ([facility]) 06/2017   s/p DES to LCX 07/02/17   Sarcoidosis    Past Surgical History:  Procedure Laterality Date   ABDOMINAL HYSTERECTOMY     BREAST BIOPSY Right 03/24/2018   BREAST REDUCTION SURGERY Bilateral 10/17/2020   Procedure: MAMMARY REDUCTION  (BREAST);  Surgeon: Cindra Presume, MD;  Location: [facility];  Service: Plastics;  Laterality: Bilateral;  2 hours   CESAREAN SECTION  1987   CORONARY/GRAFT ACUTE MI REVASCULARIZATION N/A 06/30/2017   Procedure: Coronary/Graft Acute MI Revascularization;  Surgeon: Lorretta Harp, MD;  Location: [facility];  Service: Cardiovascular;  Laterality: N/A;   FOREARM FRACTURE SURGERY Right 1977   FRACTURE SURGERY     LEFT HEART CATH AND CORONARY ANGIOGRAPHY N/A 06/30/2017   Procedure: LEFT HEART CATH AND CORONARY ANGIOGRAPHY;  Surgeon: Lorretta Harp, MD;  Location: [facility];  Service: Cardiovascular;  Laterality: N/A;   LEFT HEART CATH AND CORONARY ANGIOGRAPHY N/A 12/23/2017   Procedure: LEFT HEART CATH AND CORONARY ANGIOGRAPHY;  Surgeon: Lorretta Harp, MD;  Location: [facility];  Service: Cardiovascular;  Laterality: N/A;   TUBAL LIGATION  1990    Allergies  Allergies  Allergen Reactions   Penicillins Itching    Caused rash and blisters   Hydrocodone Hives and Itching    History of Present Illness    56 year old female with the above past medical history including CAD, hypertension, hyperlipidemia, sarcoidosis, and tobacco use.    History of CAD s/p NSTEMI, DES-LCx in 2019.  She also had a 70% pLAD lesion that was managed medically.  EF was normal at the time.  Myoview in May 2019 distant with prior MI, no new ischemia, EF 60%.  Due to persistent symptoms, she underwent repeat cardiac catheterization 2019 which showed widely patent LCx stent, 25% pLAD lesion, EF 50-55%.  She was last seen in the office on 11/30/2020 and was stable from a cardiac standpoint.  She denied symptoms concerning for angina.  She presents today for follow-up.  Since her last visit she has done well from a cardiac standpoint. She denies symptoms concerning for angina. 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 She continues to smoke. Overall, she reports feeling well and denies any new concerns today.   Home Medications    Current Outpatient Medications  Medication Sig Dispense Refill   APPLE CIDER VINEGAR PO Take 2 each by mouth daily. Goli     aspirin EC 81 MG tablet Take 1 tablet (81 mg total) by mouth daily. Swallow whole. 30 tablet    atorvastatin (LIPITOR) 20 MG tablet TAKE 1 TABLET BY MOUTH EVERY DAY 90 tablet 1   cholecalciferol (VITAMIN D3) 25 MCG (1000 UNIT) tablet Take 2,000 Units by mouth daily. Gummies     Biotin 1000 MCG CHEW Chew 2,000 mcg by mouth daily. (Patient not taking: Reported on 11/09/2021)  ibuprofen (ADVIL) 800 MG tablet Take 1 tablet (800 mg total) by mouth every 8 (eight) hours as needed. (Patient not taking: Reported on 11/09/2021) 21 tablet 0   methocarbamol (ROBAXIN) 500 MG tablet Take 1 tablet (500 mg total) by mouth 2 (two) times daily as needed for muscle spasms. (Patient not taking: Reported on 11/09/2021) 20 tablet 0   oxyCODONE-acetaminophen (PERCOCET/ROXICET) 5-325 MG tablet Take 1 tablet by mouth every 6 (six) hours as needed for severe pain. (Patient not taking: Reported on 11/09/2021) 15 tablet 0   predniSONE (STERAPRED UNI-PAK 21 TAB) 10  MG (21) TBPK tablet Take by mouth daily. Take 6 tabs by mouth daily  for 1 days, then 5 tabs for 1 days, then 4 tabs for 1 days, then 3 tabs for 1 days, 2 tabs for 1 days, then 1 tab by mouth daily for 1 days (Patient not taking: Reported on 11/09/2021) 21 tablet 0   No current facility-administered medications for this visit.     Review of Systems    She denies chest pain, palpitations, dyspnea, pnd, orthopnea, n, v, dizziness, syncope, edema, weight gain, or early satiety. All other systems reviewed and are otherwise negative except as noted above.   Physical Exam    VS:  BP 128/62 (BP Location: Left Arm, Patient Position: Sitting, Cuff Size: Large)   Pulse 96   Ht '5\' 4"'$  (1.626 m)   Wt 201 lb (91.2 kg)   SpO2 98%   BMI 34.50 kg/m  GEN: Well nourished, well developed, in no acute distress. HEENT: normal. Neck: Supple, no JVD, carotid bruits, or masses. Cardiac: RRR, no murmurs, rubs, or gallops. No clubbing, cyanosis, edema.  Radials/DP/PT 2+ and equal bilaterally.  Respiratory:  Respirations regular and unlabored, clear to auscultation bilaterally. GI: Soft, nontender, nondistended, BS + x 4. MS: no deformity or atrophy. Skin: warm and dry, no rash. Neuro:  Strength and sensation are intact. Psych: Normal affect.  Accessory Clinical Findings    ECG personally reviewed by me today - No EKG in office today.  Lab Results  Component Value Date   WBC 7.5 07/17/2021   HGB 13.6 07/17/2021   HCT 40.8 07/17/2021   MCV 86.4 07/17/2021   PLT 337 07/17/2021   Lab Results  Component Value Date   CREATININE 0.80 07/17/2021   BUN 8 07/17/2021   NA 139 07/17/2021   K 4.2 07/17/2021   CL 106 07/17/2021   CO2 26 07/17/2021   Lab Results  Component Value Date   ALT 43 05/20/2021   AST 46 (H) 05/20/2021   ALKPHOS 139 (H) 05/20/2021   BILITOT 0.4 05/20/2021   Lab Results  Component Value Date   CHOL 118 04/10/2021   HDL 34 (L) 04/10/2021   LDLCALC 65 04/10/2021   TRIG 101  04/10/2021   CHOLHDL 3.5 04/10/2021    Lab Results  Component Value Date   HGBA1C 5.8 (H) 07/02/2017    Assessment & Plan    1. CAD: CAD s/p NSTEMI, DES-LCx in 2019.   She also had a 70% pLAD lesion that was managed medically. epeat cardiac catheterization 2019 which showed widely patent LCx stent, 25% pLAD lesion, EF 50-55%. Stable with no anginal symptoms. No indication for ischemic evaluation.  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 However, it is unclear whether or not she requires a nuclear stress test for DOT clearance.  She is asymptomatic and does not require stress test based on her  symptomology, however, if the DOT requires a stress test, we can potentially perform Lexiscan Myoview.  Last Myoview was in 2019.  Patient will contact us if she requires any further testing. Continue ASA, Lipitor.   2. Hypertension: BP well controlled. She is not currently on any BP medication.   3. Hyperlipidemia: LDL was 65 in 03/2021. Continue ASA, Lipitor.   4. Tobacco use: She continues to smoke 1 pack every 2 days. Full cessation advised.   5. Disposition: Follow-up in 6 months with Dr. Gwenlyn Found.   Lenna Sciara, NP 11/09/2021, 11:26 AM

## 2021-11-09 ENCOUNTER — Other Ambulatory Visit: Payer: Self-pay

## 2021-11-09 ENCOUNTER — Ambulatory Visit: Payer: Commercial Managed Care - PPO | Admitting: Nurse Practitioner

## 2021-11-09 ENCOUNTER — Encounter: Payer: Self-pay | Admitting: Nurse Practitioner

## 2021-11-09 VITALS — BP 128/62 | HR 96 | Ht 64.0 in | Wt 201.0 lb

## 2021-11-09 DIAGNOSIS — E785 Hyperlipidemia, unspecified: Secondary | ICD-10-CM

## 2021-11-09 DIAGNOSIS — I10 Essential (primary) hypertension: Secondary | ICD-10-CM

## 2021-11-09 DIAGNOSIS — Z720 Tobacco use: Secondary | ICD-10-CM

## 2021-11-09 DIAGNOSIS — I2510 Atherosclerotic heart disease of native coronary artery without angina pectoris: Secondary | ICD-10-CM | POA: Diagnosis not present

## 2021-11-09 NOTE — Patient Instructions (Signed)
Medication Instructions:  Your physician recommends that you continue on your current medications as directed. Please refer to the Current Medication list given to you today.  *If you need a refill on your cardiac medications before your next appointment, please call your pharmacy*  Lab Work: NONE ordered at this time of appointment   If you have labs (blood work) drawn today and your tests are completely normal, you will receive your results only by: [facility] (if you have MyChart) OR A paper copy in the mail If you have any lab test that is abnormal or we need to change your treatment, we will call you to review the results.  Testing/Procedures: NONE ordered at this time of appointment   Follow-Up: At [facility], you and your health needs are our priority.  As part of our continuing mission to provide you with exceptional heart care, we have created designated Provider Care Teams.  These Care Teams include your primary Cardiologist (physician) and Advanced Practice Providers (APPs -  Physician Assistants and Nurse Practitioners) who all work together to provide you with the care you need, when you need it.   Your next appointment:   6 month(s)  The format for your next appointment:   In Person  Provider:   Quay Burow, MD     Other Instructions   Important Information About Sugar

## 2021-11-17 ENCOUNTER — Ambulatory Visit (INDEPENDENT_AMBULATORY_CARE_PROVIDER_SITE_OTHER): Payer: Commercial Managed Care - PPO

## 2021-11-17 ENCOUNTER — Telehealth: Payer: Self-pay

## 2021-11-17 DIAGNOSIS — I2510 Atherosclerotic heart disease of native coronary artery without angina pectoris: Secondary | ICD-10-CM

## 2021-11-17 LAB — ECHOCARDIOGRAM COMPLETE
AR max vel: 1.66 cm2
AV Area VTI: 1.81 cm2
AV Area mean vel: 1.67 cm2
AV Mean grad: 4 mmHg
AV Peak grad: 7 mmHg
Ao pk vel: 1.32 m/s
Area-P 1/2: 3.45 cm2
Calc EF: 55.1 %
S' Lateral: 3.06 cm
Single Plane A2C EF: 54.7 %
Single Plane A4C EF: 55.3 %

## 2021-11-17 NOTE — Telephone Encounter (Signed)
Spoke with pt. Pt was notified of echo results and is aware that results were forwarded to Dr. Gwenlyn Found.

## 2021-11-21 ENCOUNTER — Telehealth: Payer: Self-pay | Admitting: Cardiovascular Disease

## 2021-11-21 NOTE — Telephone Encounter (Signed)
Pt calling to f/u on DOT Physical form> Pt states that she needs form filled out and back to her job by 11/28/21. Pt would like to know when she is able to pick form up or can it be faxed to her job. Please advise

## 2021-11-22 NOTE — Telephone Encounter (Signed)
Spoke with pt regarding DOT physical form being completed. Pt will have someone come by the office to pick this up. Pt verbalizes understanding.

## 2022-02-23 ENCOUNTER — Other Ambulatory Visit: Payer: Self-pay | Admitting: Cardiovascular Disease

## 2022-04-12 ENCOUNTER — Ambulatory Visit: Payer: Commercial Managed Care - PPO | Admitting: Podiatry

## 2022-04-12 ENCOUNTER — Encounter: Payer: Self-pay | Admitting: Podiatry

## 2022-04-12 DIAGNOSIS — I739 Peripheral vascular disease, unspecified: Secondary | ICD-10-CM | POA: Diagnosis not present

## 2022-04-12 DIAGNOSIS — G5753 Tarsal tunnel syndrome, bilateral lower limbs: Secondary | ICD-10-CM

## 2022-04-12 DIAGNOSIS — G609 Hereditary and idiopathic neuropathy, unspecified: Secondary | ICD-10-CM

## 2022-04-12 MED ORDER — GABAPENTIN 300 MG PO CAPS: 300.0000 mg | ORAL_CAPSULE | Freq: Every day | ORAL | 0 refills | Status: DC

## 2022-04-12 NOTE — Progress Notes (Signed)
  Subjective:  Patient ID: [name], female    DOB: 07-02-1965,  MRN: [id number]  Chief Complaint  Patient presents with   Numbness    np bilateral burning pain and tingling - hard to walk. Feet either feel super cold or super hot - She works as a Recruitment consultant. Worse at night when she lies down. History of Coronary Artery Disease    56 y.o. female presents with the above complaint. History confirmed with patient.  She smokes 2 packs/day.  Objective:  Physical Exam: warm, good capillary refill, there is a gradient to cool at the toes, palpable DP, nonpalpable PT, abnormal sensory exam with scattered appreciation of monofilament in the tips of the toes and ball of the foot, compression and palpation of the tarsal tunnel produces a Tinel's sign   Assessment:   1. Idiopathic neuropathy   2. Claudication of both lower extremities ([place])   3. Tarsal tunnel syndrome of both lower extremities      Plan:  Patient was evaluated and treated and all questions answered.  I discussed with her clinical exam findings are consistent with a polyneuropathy and possible tarsal tunnel syndrome.  Some also may be possibly secondary to peripheral vascular disease.  She has not been checked for this and has significant risk factors as a 2 pack/day smoker.  I recommend evaluation with lab work, we will check for any arthritides as well as B12 and folate level and a metabolic panel.  We discussed treatment of the pain with Neurontin and we discussed the risks and benefits of this.  Begin with 300 mg nightly.  I would like to refer her to neurology for EMG and NCV and evaluation for neuropathy as well.  Finally we will evaluate with noninvasive vascular testing and ABIs were ordered.  I will see her back after testing is completed.  Return for after testing is completed .

## 2022-04-12 NOTE — Patient Instructions (Signed)
Call [phone] to schedule your vascular testing:  [facility] [address], [place], [place] [address]   We will send the referral for your nerve testing, please let us know if you do not hear from them

## 2022-04-13 LAB — COMPREHENSIVE METABOLIC PANEL
ALT: 24 IU/L (ref 0–32)
AST: 20 IU/L (ref 0–40)
Albumin/Globulin Ratio: 2 (ref 1.2–2.2)
Albumin: 4.1 g/dL (ref 3.8–4.9)
Alkaline Phosphatase: 164 IU/L — ABNORMAL HIGH (ref 44–121)
BUN/Creatinine Ratio: 10 (ref 9–23)
BUN: 8 mg/dL (ref 6–24)
Bilirubin Total: 0.5 mg/dL (ref 0.0–1.2)
CO2: 21 mmol/L (ref 20–29)
Calcium: 10.2 mg/dL (ref 8.7–10.2)
Chloride: 105 mmol/L (ref 96–106)
Creatinine, Ser: 0.8 mg/dL (ref 0.57–1.00)
Globulin, Total: 2.1 g/dL (ref 1.5–4.5)
Glucose: 110 mg/dL — ABNORMAL HIGH (ref 70–99)
Potassium: 4.4 mmol/L (ref 3.5–5.2)
Sodium: 141 mmol/L (ref 134–144)
Total Protein: 6.2 g/dL (ref 6.0–8.5)
eGFR: 86 mL/min/{1.73_m2} (ref >59)

## 2022-04-13 LAB — B12 AND FOLATE PANEL
Folate: 12.6 ng/mL (ref >3.0)
Vitamin B-12: 1241 pg/mL (ref 232–1245)

## 2022-04-13 LAB — ARTHRITIS PANEL
Anti Nuclear Antibody (ANA): NEGATIVE
Rheumatoid fact SerPl-aCnc: <10 IU/mL (ref <14.0)
Sed Rate: 19 mm/hr (ref 0–40)
Uric Acid: 6 mg/dL (ref 3.0–7.2)

## 2022-04-16 ENCOUNTER — Ambulatory Visit (HOSPITAL_COMMUNITY)
Admission: RE | Admit: 2022-04-16 | Discharge: 2022-04-16 | Disposition: A | Discharge status: Home / Self Care | Payer: Commercial Managed Care - PPO | Source: Ambulatory Visit | Attending: Podiatry | Admitting: Podiatry

## 2022-04-16 DIAGNOSIS — I739 Peripheral vascular disease, unspecified: Secondary | ICD-10-CM | POA: Insufficient documentation

## 2022-04-17 ENCOUNTER — Telehealth: Payer: Self-pay

## 2022-04-17 NOTE — Telephone Encounter (Signed)
Pt called requesting a note for work d/t [place] appt.  Reviewed pt's chart, returned call for clarification, two identifiers used. Pt asked for the letter to be emailed to her boss. Informed her that it could be faxed, but not emailed. Pt stated that she would pick it up on 11/7 during her lunch break. Confirmed understanding.   Letter printed, signed, and given to reception to give to pt.

## 2022-04-22 ENCOUNTER — Encounter (HOSPITAL_BASED_OUTPATIENT_CLINIC_OR_DEPARTMENT_OTHER): Payer: Self-pay

## 2022-04-22 ENCOUNTER — Emergency Department (HOSPITAL_BASED_OUTPATIENT_CLINIC_OR_DEPARTMENT_OTHER): Payer: Commercial Managed Care - PPO | Admitting: Radiology

## 2022-04-22 ENCOUNTER — Other Ambulatory Visit: Payer: Self-pay

## 2022-04-22 ENCOUNTER — Emergency Department (HOSPITAL_BASED_OUTPATIENT_CLINIC_OR_DEPARTMENT_OTHER)
Admission: EM | Admit: 2022-04-22 | Discharge: 2022-04-22 | Disposition: A | Discharge status: Home / Self Care | Payer: Commercial Managed Care - PPO | Attending: Emergency Medicine | Admitting: Emergency Medicine

## 2022-04-22 DIAGNOSIS — I2510 Atherosclerotic heart disease of native coronary artery without angina pectoris: Secondary | ICD-10-CM | POA: Insufficient documentation

## 2022-04-22 DIAGNOSIS — R062 Wheezing: Secondary | ICD-10-CM

## 2022-04-22 DIAGNOSIS — R0600 Dyspnea, unspecified: Secondary | ICD-10-CM | POA: Diagnosis not present

## 2022-04-22 DIAGNOSIS — Z7982 Long term (current) use of aspirin: Secondary | ICD-10-CM | POA: Diagnosis not present

## 2022-04-22 DIAGNOSIS — Z1152 Encounter for screening for COVID-19: Secondary | ICD-10-CM | POA: Insufficient documentation

## 2022-04-22 DIAGNOSIS — Z7951 Long term (current) use of inhaled steroids: Secondary | ICD-10-CM | POA: Diagnosis not present

## 2022-04-22 LAB — BASIC METABOLIC PANEL
Anion gap: 8 (ref 5–15)
BUN: 9 mg/dL (ref 6–20)
CO2: 24 mmol/L (ref 22–32)
Calcium: 9.6 mg/dL (ref 8.9–10.3)
Chloride: 106 mmol/L (ref 98–111)
Creatinine, Ser: 0.76 mg/dL (ref 0.44–1.00)
GFR, Estimated: >60 mL/min (ref >60)
Glucose, Bld: 110 mg/dL — ABNORMAL HIGH (ref 70–99)
Potassium: 4.4 mmol/L (ref 3.5–5.1)
Sodium: 138 mmol/L (ref 135–145)

## 2022-04-22 LAB — CBC WITH DIFFERENTIAL/PLATELET
Abs Immature Granulocytes: 0.01 10*3/uL (ref 0.00–0.07)
Basophils Absolute: 0 10*3/uL (ref 0.0–0.1)
Basophils Relative: 1 %
Eosinophils Absolute: 0.2 10*3/uL (ref 0.0–0.5)
Eosinophils Relative: 3 %
HCT: 40.8 % (ref 36.0–46.0)
Hemoglobin: 13.4 g/dL (ref 12.0–15.0)
Immature Granulocytes: 0 %
Lymphocytes Relative: 56 %
Lymphs Abs: 2.8 10*3/uL (ref 0.7–4.0)
MCH: 26.9 pg (ref 26.0–34.0)
MCHC: 32.8 g/dL (ref 30.0–36.0)
MCV: 81.9 fL (ref 80.0–100.0)
Monocytes Absolute: 0.6 10*3/uL (ref 0.1–1.0)
Monocytes Relative: 11 %
Neutro Abs: 1.5 10*3/uL — ABNORMAL LOW (ref 1.7–7.7)
Neutrophils Relative %: 29 %
Platelets: 269 10*3/uL (ref 150–400)
RBC: 4.98 MIL/uL (ref 3.87–5.11)
RDW: 14.7 % (ref 11.5–15.5)
WBC: 5 10*3/uL (ref 4.0–10.5)
nRBC: 0 % (ref 0.0–0.2)

## 2022-04-22 LAB — TROPONIN I (HIGH SENSITIVITY)
Troponin I (High Sensitivity): 4 ng/L (ref <18)
Troponin I (High Sensitivity): 3 ng/L (ref <18)

## 2022-04-22 LAB — RESP PANEL BY RT-PCR (FLU A&B, COVID) ARPGX2
Influenza A by PCR: NEGATIVE
Influenza B by PCR: NEGATIVE
SARS Coronavirus 2 by RT PCR: NEGATIVE

## 2022-04-22 LAB — BRAIN NATRIURETIC PEPTIDE: B Natriuretic Peptide: 54 pg/mL (ref 0.0–100.0)

## 2022-04-22 MED ORDER — BENZONATATE 100 MG PO CAPS: 100.0000 mg | ORAL_CAPSULE | Freq: Three times a day (TID) | ORAL | 0 refills | Status: DC

## 2022-04-22 MED ORDER — IPRATROPIUM-ALBUTEROL 0.5-2.5 (3) MG/3ML IN SOLN
3.0000 mL | Freq: Once | RESPIRATORY_TRACT | Status: AC
  Administered 2022-04-22: 3 mL via RESPIRATORY_TRACT

## 2022-04-22 MED ORDER — ALBUTEROL SULFATE HFA 108 (90 BASE) MCG/ACT IN AERS
INHALATION_SPRAY | RESPIRATORY_TRACT | Status: AC
  Filled 2022-04-22: qty 6.7

## 2022-04-22 MED ORDER — ALBUTEROL SULFATE (2.5 MG/3ML) 0.083% IN NEBU
INHALATION_SOLUTION | RESPIRATORY_TRACT | Status: AC
  Administered 2022-04-22: 2.5 mg
  Filled 2022-04-22: qty 3

## 2022-04-22 MED ORDER — IPRATROPIUM-ALBUTEROL 0.5-2.5 (3) MG/3ML IN SOLN
RESPIRATORY_TRACT | Status: AC
  Filled 2022-04-22: qty 3

## 2022-04-22 MED ORDER — ALBUTEROL SULFATE HFA 108 (90 BASE) MCG/ACT IN AERS: 1.0000 | INHALATION_SPRAY | Freq: Four times a day (QID) | RESPIRATORY_TRACT | 0 refills | Status: DC | PRN

## 2022-04-22 MED ORDER — PREDNISONE 10 MG PO TABS: 20.0000 mg | ORAL_TABLET | Freq: Every day | ORAL | 0 refills | Status: DC

## 2022-04-22 MED ORDER — DEXAMETHASONE SODIUM PHOSPHATE 10 MG/ML IJ SOLN
10.0000 mg | Freq: Once | INTRAMUSCULAR | Status: AC
  Administered 2022-04-22: 10 mg via INTRAVENOUS
  Filled 2022-04-22: qty 1

## 2022-04-22 NOTE — ED Triage Notes (Signed)
Pt arrives via POV from home. Pt c/o a cough for the past few days. Pt states she began having chest pain about 2 days ago. States pain is in the center of her chest, non radiating. States the pain in her chest does feel similar to her pain when she had a previous heart attack. Report associated sob and nausea.

## 2022-04-22 NOTE — Discharge Instructions (Signed)
You were evaluated today for shortness of breath.  Your work-up was reassuring for no pneumonia or signs of significant infection.  Your COVID and influenza testing was negative.  Your wheezes are concerning for possible COPD.  I recommend that she follow-up with your primary care provider for further evaluation and management.  I have prescribed a steroid for you to take at home along with an albuterol inhaler to hopefully help with your symptoms.

## 2022-04-22 NOTE — ED Provider Notes (Signed)
[facility] EMERGENCY DEPT Provider Note   CSN: [id number] Arrival date & time: 04/22/22  1234     History  Chief Complaint  Patient presents with   Cough   Chest Pain   Shortness of Breath    [name] is a 56 y.o. female.  Patient presents to the hospital complaining of 4 days of wheezing and cough and 2 days of central chest pain.  Patient describes the cough is nonproductive.  She does report some troll chest pain with no radiation of the pain.  She states the pain in her chest feels similar to pain when she had a heart attack in 2019.  She does endorse some mild nausea.  She denies fevers, abdominal pain, urinary symptoms.  Past medical history significant for coronary artery disease, history NSTEMI, tobacco abuse, sarcoidosis HPI     Home Medications Prior to Admission medications   Medication Sig Start Date End Date Taking? Authorizing Provider  albuterol (VENTOLIN HFA) 108 (90 Base) MCG/ACT inhaler Inhale 1-2 puffs into the lungs every 6 (six) hours as needed for wheezing or shortness of breath. 04/22/22  Yes Dorothyann Peng, PA-C  benzonatate (TESSALON) 100 MG capsule Take 1 capsule (100 mg total) by mouth every 8 (eight) hours. 04/22/22  Yes Dorothyann Peng, PA-C  predniSONE (DELTASONE) 10 MG tablet Take 2 tablets (20 mg total) by mouth daily for 10 days. 04/22/22 05/02/22 Yes Dorothyann Peng, PA-C  APPLE CIDER VINEGAR PO Take 2 each by mouth daily. [place]    [provider]  aspirin EC 81 MG tablet Take 1 tablet (81 mg total) by mouth daily. Swallow whole. 12/04/19   Lorretta Harp, MD  atorvastatin (LIPITOR) 20 MG tablet TAKE 1 TABLET BY MOUTH EVERY DAY 02/23/22   Lorretta Harp, MD  Biotin 1000 MCG CHEW Chew 2,000 mcg by mouth daily. Patient not taking: Reported on 11/09/2021    [provider]  cholecalciferol (VITAMIN D3) 25 MCG (1000 UNIT) tablet Take 2,000 Units by mouth daily. Gummies    [provider]   gabapentin (NEURONTIN) 300 MG capsule Take 1 capsule (300 mg total) by mouth at bedtime. 04/12/22 07/11/22  McDonald, Stephan Minister, DPM  ibuprofen (ADVIL) 800 MG tablet Take 1 tablet (800 mg total) by mouth every 8 (eight) hours as needed. Patient not taking: Reported on 11/09/2021 02/14/21   Hayden Rasmussen, MD  methocarbamol (ROBAXIN) 500 MG tablet Take 1 tablet (500 mg total) by mouth 2 (two) times daily as needed for muscle spasms. Patient not taking: Reported on 11/09/2021 02/14/21   Hayden Rasmussen, MD  oxyCODONE-acetaminophen (PERCOCET/ROXICET) 5-325 MG tablet Take 1 tablet by mouth every 6 (six) hours as needed for severe pain. Patient not taking: Reported on 11/09/2021 02/14/21   Hayden Rasmussen, MD      Allergies    Penicillins and Hydrocodone    Review of Systems   Review of Systems  Respiratory:  Positive for cough and shortness of breath.   Cardiovascular:  Positive for chest pain.  Gastrointestinal:  Positive for nausea. Negative for abdominal pain and vomiting.  Genitourinary:  Negative for dysuria.    Physical Exam Updated Vital Signs BP (!) 160/130   Pulse 83   Temp 97.9 F (36.6 C) (Oral)   Resp 16   Ht 5' 4.5" (1.638 m)   Wt 90.7 kg   SpO2 100%   BMI 33.80 kg/m  Physical Exam Vitals and nursing note reviewed.  Constitutional:  General: She is not in acute distress.    Appearance: She is well-developed.  HENT:     Head: Normocephalic and atraumatic.  Eyes:     Conjunctiva/sclera: Conjunctivae normal.  Cardiovascular:     Rate and Rhythm: Normal rate and regular rhythm.     Heart sounds: No murmur heard. Pulmonary:     Effort: Pulmonary effort is normal. No respiratory distress.     Breath sounds: Wheezing present.  Chest:     Chest wall: No tenderness.  Abdominal:     Palpations: Abdomen is soft.     Tenderness: There is no abdominal tenderness.  Musculoskeletal:        General: No swelling.     Cervical back: Neck supple.  Skin:    General: Skin is  warm and dry.     Capillary Refill: Capillary refill takes less than 2 seconds.  Neurological:     Mental Status: She is alert.  Psychiatric:        Mood and Affect: Mood normal.     ED Results / Procedures / Treatments   Labs (all labs ordered are listed, but only abnormal results are displayed) Labs Reviewed  BASIC METABOLIC PANEL - Abnormal; Notable for the following components:      Result Value   Glucose, Bld 110 (*)    All other components within normal limits  CBC WITH DIFFERENTIAL/PLATELET - Abnormal; Notable for the following components:   Neutro Abs 1.5 (*)    All other components within normal limits  RESP PANEL BY RT-PCR (FLU A&B, COVID) ARPGX2  BRAIN NATRIURETIC PEPTIDE  TROPONIN I (HIGH SENSITIVITY)  TROPONIN I (HIGH SENSITIVITY)    EKG None  Radiology DG Chest 2 View  Result Date: 04/22/2022 CLINICAL DATA:  Shortness of breath.  Cough for the past few days EXAM: CHEST - 2 VIEW COMPARISON:  05/20/2021 FINDINGS: Lateral view degraded by patient arm position. Midline trachea. Normal heart size and mediastinal contours. No pleural effusion or pneumothorax. Moderate pulmonary interstitial thickening. No lobar consolidation. IMPRESSION: No acute cardiopulmonary disease. Moderate pulmonary interstitial thickening is likely related to EMR history of twenty-nine pack-year smoking. Electronically Signed   By: Abigail Miyamoto M.D.   On: 04/22/2022 13:27    Procedures Procedures    Medications Ordered in ED Medications  ipratropium-albuterol (DUONEB) 0.5-2.5 (3) MG/3ML nebulizer solution (  Not Given 04/22/22 1522)  albuterol (PROVENTIL) (2.5 MG/3ML) 0.083% nebulizer solution (2.5 mg  Given 04/22/22 1251)  ipratropium-albuterol (DUONEB) 0.5-2.5 (3) MG/3ML nebulizer solution 3 mL (3 mLs Nebulization Given 04/22/22 1522)  dexamethasone (DECADRON) injection 10 mg (10 mg Intravenous Given 04/22/22 1732)  albuterol (VENTOLIN HFA) 108 (90 Base) MCG/ACT inhaler (  Given 04/22/22  1705)    ED Course/ Medical Decision Making/ A&P                           Medical Decision Making Amount and/or Complexity of Data Reviewed Labs: ordered. Radiology: ordered.  Risk Prescription drug management.   This patient presents to the ED for concern of shortness of breath and chest pain, this involves an extensive number of treatment options, and is a complaint that carries with it a high risk of complications and morbidity.  The differential diagnosis includes ACS, PE, pneumonia, COPD, and others   Co morbidities that complicate the patient evaluation  Sarcoidosis, CAD   Additional history obtained:   External records from outside source obtained and reviewed including podiatry notes  from November 2 due to idiopathic neuropathy   Lab Tests:  I Ordered, and personally interpreted labs.  The pertinent results include: Troponin 3, BNP 54, grossly normal CBC, grossly normal BMP, negative respiratory panel   Imaging Studies ordered:  I ordered imaging studies including chest x-ray I independently visualized and interpreted imaging which showed  No acute cardiopulmonary disease. Moderate pulmonary interstitial  thickening is likely related to EMR history of twenty-nine pack-year  smoking.   I agree with the radiologist interpretation   Cardiac Monitoring: / EKG:  The patient was maintained on a cardiac monitor.  I personally viewed and interpreted the cardiac monitored which showed an underlying rhythm of: Normal sinus rhythm   Problem List / ED Course / Critical interventions / Medication management   I ordered medication including albuterol, DuoNeb, and Decadron for wheezes Reevaluation of the patient after these medicines showed that the patient improved I have reviewed the patients home medicines and have made adjustments as needed   Test / Admission - Considered:  The patient has negative troponins and a nonischemic EKG.  Very low suspicion of ACS.   The patient's chest pain is not sharp in nature, she is not tachycardic, very low clinical suspicion of PE.  No pneumonia on chest x-ray.  Question if the patient may be developing COPD which is been previously undiagnosed.  Plan to discharge patient home with steroids and with a rescue inhaler and have her follow-up with her primary care provider.  Patient understands plan.        Final Clinical Impression(s) / ED Diagnoses Final diagnoses:  Wheezing  Dyspnea, unspecified type    Rx / DC Orders ED Discharge Orders          Ordered    predniSONE (DELTASONE) 10 MG tablet  Daily        04/22/22 1752    albuterol (VENTOLIN HFA) 108 (90 Base) MCG/ACT inhaler  Every 6 hours PRN        04/22/22 1752    benzonatate (TESSALON) 100 MG capsule  Every 8 hours        04/22/22 1807              Ronny Bacon 04/22/22 1811    Fransico Meadow, MD 04/24/22 [phone]

## 2022-04-22 NOTE — ED Provider Triage Note (Signed)
Emergency Medicine Provider Triage Evaluation Note  [name] , a 56 y.o. female  was evaluated in triage.  Pt complains of 4 days of worsening chest congestion and cough she states that she feels that her chest is "raspy".  She also complains of substernal chest pain which began approximately 2 days ago she rates this pain as 8 out of 10 in severity.  She denies radiation of symptoms.  She does endorse mild nausea for the past 2 days.  She states that she had a heart attack in 2019 and that this pain feels similar to that heart attack.  Review of Systems  Positive: As above Negative: As above  Physical Exam  There were no vitals taken for this visit. Gen:   Awake, no distress   Resp:  Normal effort  MSK:   Moves extremities without difficulty  Other:    Medical Decision Making  Medically screening exam initiated at 12:40 PM.  Appropriate orders placed.  DAMILOLA FLAMM was informed that the remainder of the evaluation will be completed by another provider, this initial triage assessment does not replace that evaluation, and the importance of remaining in the ED until their evaluation is complete.     Dorothyann Peng, PA-C 04/22/22 1241

## 2022-04-23 ENCOUNTER — Encounter: Payer: Self-pay | Admitting: Neurology

## 2022-04-23 ENCOUNTER — Telehealth: Payer: Self-pay

## 2022-04-23 NOTE — Telephone Encounter (Signed)
Patient is aware of results and was able to get her appt scheduled with [facility]

## 2022-04-27 ENCOUNTER — Ambulatory Visit: Payer: BLUE CROSS/BLUE SHIELD | Admitting: Cardiovascular Disease

## 2022-05-02 ENCOUNTER — Emergency Department (HOSPITAL_BASED_OUTPATIENT_CLINIC_OR_DEPARTMENT_OTHER): Payer: Commercial Managed Care - PPO

## 2022-05-02 ENCOUNTER — Inpatient Hospital Stay (HOSPITAL_BASED_OUTPATIENT_CLINIC_OR_DEPARTMENT_OTHER)
Admission: EM | Admit: 2022-05-02 | Discharge: 2022-05-16 | DRG: 356 | Disposition: A | Discharge status: Home Health | Payer: Commercial Managed Care - PPO | Attending: Vascular Surgery | Admitting: Vascular Surgery

## 2022-05-02 ENCOUNTER — Other Ambulatory Visit: Payer: Self-pay

## 2022-05-02 ENCOUNTER — Encounter (HOSPITAL_BASED_OUTPATIENT_CLINIC_OR_DEPARTMENT_OTHER): Payer: Self-pay

## 2022-05-02 DIAGNOSIS — Z803 Family history of malignant neoplasm of breast: Secondary | ICD-10-CM

## 2022-05-02 DIAGNOSIS — K55021 Focal (segmental) acute infarction of small intestine: Secondary | ICD-10-CM | POA: Diagnosis not present

## 2022-05-02 DIAGNOSIS — I10 Essential (primary) hypertension: Secondary | ICD-10-CM | POA: Diagnosis present

## 2022-05-02 DIAGNOSIS — Z880 Allergy status to penicillin: Secondary | ICD-10-CM

## 2022-05-02 DIAGNOSIS — E785 Hyperlipidemia, unspecified: Secondary | ICD-10-CM | POA: Diagnosis present

## 2022-05-02 DIAGNOSIS — Z83438 Family history of other disorder of lipoprotein metabolism and other lipidemia: Secondary | ICD-10-CM

## 2022-05-02 DIAGNOSIS — K55069 Acute infarction of intestine, part and extent unspecified: Secondary | ICD-10-CM

## 2022-05-02 DIAGNOSIS — Z7902 Long term (current) use of antithrombotics/antiplatelets: Secondary | ICD-10-CM

## 2022-05-02 DIAGNOSIS — Z90710 Acquired absence of both cervix and uterus: Secondary | ICD-10-CM

## 2022-05-02 DIAGNOSIS — Z7982 Long term (current) use of aspirin: Secondary | ICD-10-CM

## 2022-05-02 DIAGNOSIS — Z833 Family history of diabetes mellitus: Secondary | ICD-10-CM

## 2022-05-02 DIAGNOSIS — K661 Hemoperitoneum: Secondary | ICD-10-CM | POA: Diagnosis not present

## 2022-05-02 DIAGNOSIS — Z98891 History of uterine scar from previous surgery: Secondary | ICD-10-CM

## 2022-05-02 DIAGNOSIS — I252 Old myocardial infarction: Secondary | ICD-10-CM

## 2022-05-02 DIAGNOSIS — K567 Ileus, unspecified: Secondary | ICD-10-CM | POA: Diagnosis not present

## 2022-05-02 DIAGNOSIS — I513 Intracardiac thrombosis, not elsewhere classified: Secondary | ICD-10-CM | POA: Diagnosis present

## 2022-05-02 DIAGNOSIS — Z9889 Other specified postprocedural states: Secondary | ICD-10-CM

## 2022-05-02 DIAGNOSIS — I2510 Atherosclerotic heart disease of native coronary artery without angina pectoris: Secondary | ICD-10-CM | POA: Diagnosis present

## 2022-05-02 DIAGNOSIS — R1084 Generalized abdominal pain: Secondary | ICD-10-CM | POA: Diagnosis not present

## 2022-05-02 DIAGNOSIS — Z8249 Family history of ischemic heart disease and other diseases of the circulatory system: Secondary | ICD-10-CM

## 2022-05-02 DIAGNOSIS — K55059 Acute (reversible) ischemia of intestine, part and extent unspecified: Secondary | ICD-10-CM

## 2022-05-02 DIAGNOSIS — J9601 Acute respiratory failure with hypoxia: Secondary | ICD-10-CM | POA: Diagnosis not present

## 2022-05-02 DIAGNOSIS — Z955 Presence of coronary angioplasty implant and graft: Secondary | ICD-10-CM

## 2022-05-02 DIAGNOSIS — K658 Other peritonitis: Secondary | ICD-10-CM | POA: Diagnosis present

## 2022-05-02 DIAGNOSIS — I7410 Embolism and thrombosis of unspecified parts of aorta: Principal | ICD-10-CM

## 2022-05-02 DIAGNOSIS — R4182 Altered mental status, unspecified: Secondary | ICD-10-CM | POA: Diagnosis not present

## 2022-05-02 DIAGNOSIS — K9189 Other postprocedural complications and disorders of digestive system: Secondary | ICD-10-CM | POA: Diagnosis not present

## 2022-05-02 DIAGNOSIS — J9811 Atelectasis: Secondary | ICD-10-CM | POA: Diagnosis present

## 2022-05-02 DIAGNOSIS — I7409 Other arterial embolism and thrombosis of abdominal aorta: Secondary | ICD-10-CM | POA: Diagnosis present

## 2022-05-02 DIAGNOSIS — F17210 Nicotine dependence, cigarettes, uncomplicated: Secondary | ICD-10-CM | POA: Diagnosis present

## 2022-05-02 DIAGNOSIS — Z885 Allergy status to narcotic agent status: Secondary | ICD-10-CM

## 2022-05-02 DIAGNOSIS — Z823 Family history of stroke: Secondary | ICD-10-CM

## 2022-05-02 DIAGNOSIS — J9602 Acute respiratory failure with hypercapnia: Secondary | ICD-10-CM | POA: Diagnosis not present

## 2022-05-02 DIAGNOSIS — D869 Sarcoidosis, unspecified: Secondary | ICD-10-CM | POA: Diagnosis present

## 2022-05-02 LAB — COMPREHENSIVE METABOLIC PANEL
ALT: 19 U/L (ref 0–44)
AST: 13 U/L — ABNORMAL LOW (ref 15–41)
Albumin: 4.1 g/dL (ref 3.5–5.0)
Alkaline Phosphatase: 110 U/L (ref 38–126)
Anion gap: 10 (ref 5–15)
BUN: 9 mg/dL (ref 6–20)
CO2: 24 mmol/L (ref 22–32)
Calcium: 10.4 mg/dL — ABNORMAL HIGH (ref 8.9–10.3)
Chloride: 103 mmol/L (ref 98–111)
Creatinine, Ser: 0.88 mg/dL (ref 0.44–1.00)
GFR, Estimated: >60 mL/min (ref >60)
Glucose, Bld: 138 mg/dL — ABNORMAL HIGH (ref 70–99)
Potassium: 4.3 mmol/L (ref 3.5–5.1)
Sodium: 137 mmol/L (ref 135–145)
Total Bilirubin: 0.5 mg/dL (ref 0.3–1.2)
Total Protein: 7.2 g/dL (ref 6.5–8.1)

## 2022-05-02 LAB — CBC
HCT: 43.9 % (ref 36.0–46.0)
Hemoglobin: 14.7 g/dL (ref 12.0–15.0)
MCH: 26.9 pg (ref 26.0–34.0)
MCHC: 33.5 g/dL (ref 30.0–36.0)
MCV: 80.4 fL (ref 80.0–100.0)
Platelets: 285 10*3/uL (ref 150–400)
RBC: 5.46 MIL/uL — ABNORMAL HIGH (ref 3.87–5.11)
RDW: 14.2 % (ref 11.5–15.5)
WBC: 11 10*3/uL — ABNORMAL HIGH (ref 4.0–10.5)
nRBC: 0 % (ref 0.0–0.2)

## 2022-05-02 LAB — LIPASE, BLOOD: Lipase: 22 U/L (ref 11–51)

## 2022-05-02 MED ORDER — IOHEXOL 300 MG/ML  SOLN
100.0000 mL | Freq: Once | INTRAMUSCULAR | Status: AC | PRN
  Administered 2022-05-02: 85 mL via INTRAVENOUS

## 2022-05-02 MED ORDER — HYDROMORPHONE HCL 1 MG/ML IJ SOLN
1.0000 mg | Freq: Once | INTRAMUSCULAR | Status: AC
  Administered 2022-05-02: 1 mg via INTRAVENOUS
  Filled 2022-05-02: qty 1

## 2022-05-02 MED ORDER — LACTATED RINGERS IV BOLUS
1000.0000 mL | Freq: Once | INTRAVENOUS | Status: AC
  Administered 2022-05-03: 1000 mL via INTRAVENOUS

## 2022-05-02 MED ORDER — SODIUM CHLORIDE 0.9 % IV BOLUS
1000.0000 mL | Freq: Once | INTRAVENOUS | Status: AC
  Administered 2022-05-02: 1000 mL via INTRAVENOUS

## 2022-05-02 MED ORDER — ONDANSETRON HCL 4 MG/2ML IJ SOLN
4.0000 mg | Freq: Once | INTRAMUSCULAR | Status: AC | PRN
  Administered 2022-05-02: 4 mg via INTRAVENOUS
  Filled 2022-05-02: qty 2

## 2022-05-02 NOTE — ED Notes (Signed)
Attempting to place 20G PIV x2 however unsuccessful -- Legrand Como, RN is now attempting u/s guided - once 20G placed pt will go to CT for stat CTA Abd/Pelvis

## 2022-05-02 NOTE — ED Provider Notes (Signed)
[facility] EMERGENCY DEPT Provider Note   CSN: [id number] Arrival date & time: 05/02/22  2011     History  Chief Complaint  Patient presents with   Abdominal Pain    [name] is a 56 y.o. female.  HPI 56 year old female presents with abdominal pain since yesterday.  Is primarily right-sided and into her flank.  However she is also having diffuse abdominal pain.  She has nausea without vomiting.  No diarrhea, fever.  She feels like the abdominal pain is making her chest hurt.  She took some Pepto-Bismol.  The pain is currently severe.  She has never had pain like this in her abdomen before.  Home Medications Prior to Admission medications   Medication Sig Start Date End Date Taking? Authorizing Provider  albuterol (VENTOLIN HFA) 108 (90 Base) MCG/ACT inhaler Inhale 1-2 puffs into the lungs every 6 (six) hours as needed for wheezing or shortness of breath. 04/22/22   Dorothyann Peng, PA-C  APPLE CIDER VINEGAR PO Take 2 each by mouth daily. [place]    [provider]  aspirin EC 81 MG tablet Take 1 tablet (81 mg total) by mouth daily. Swallow whole. 12/04/19   Lorretta Harp, MD  atorvastatin (LIPITOR) 20 MG tablet TAKE 1 TABLET BY MOUTH EVERY DAY 02/23/22   Lorretta Harp, MD  benzonatate (TESSALON) 100 MG capsule Take 1 capsule (100 mg total) by mouth every 8 (eight) hours. 04/22/22   Dorothyann Peng, PA-C  Biotin 1000 MCG CHEW Chew 2,000 mcg by mouth daily. Patient not taking: Reported on 11/09/2021    [provider]  cholecalciferol (VITAMIN D3) 25 MCG (1000 UNIT) tablet Take 2,000 Units by mouth daily. Gummies    [provider]  gabapentin (NEURONTIN) 300 MG capsule Take 1 capsule (300 mg total) by mouth at bedtime. 04/12/22 07/11/22  McDonald, Stephan Minister, DPM  ibuprofen (ADVIL) 800 MG tablet Take 1 tablet (800 mg total) by mouth every 8 (eight) hours as needed. Patient not taking: Reported on 11/09/2021 02/14/21   Hayden Rasmussen, MD   methocarbamol (ROBAXIN) 500 MG tablet Take 1 tablet (500 mg total) by mouth 2 (two) times daily as needed for muscle spasms. Patient not taking: Reported on 11/09/2021 02/14/21   Hayden Rasmussen, MD  oxyCODONE-acetaminophen (PERCOCET/ROXICET) 5-325 MG tablet Take 1 tablet by mouth every 6 (six) hours as needed for severe pain. Patient not taking: Reported on 11/09/2021 02/14/21   Hayden Rasmussen, MD  predniSONE (DELTASONE) 10 MG tablet Take 2 tablets (20 mg total) by mouth daily for 10 days. 04/22/22 05/02/22  Dorothyann Peng, PA-C      Allergies    Penicillins and Hydrocodone    Review of Systems   Review of Systems  Gastrointestinal:  Positive for abdominal pain and nausea. Negative for vomiting.    Physical Exam Updated Vital Signs BP (!) 162/107 (BP Location: Right Arm)   Pulse 84   Temp 97.8 F (36.6 C)   Resp 18   Ht '5\' 4"'$  (1.626 m)   Wt 90.7 kg   SpO2 100%   BMI 34.33 kg/m  Physical Exam Vitals and nursing note reviewed.  Constitutional:      Appearance: She is well-developed. She is not diaphoretic.  HENT:     Head: Normocephalic and atraumatic.  Cardiovascular:     Rate and Rhythm: Normal rate and regular rhythm.     Heart sounds: Normal heart sounds.  Pulmonary:     Effort:  Pulmonary effort is normal.     Breath sounds: Normal breath sounds.  Abdominal:     Palpations: Abdomen is soft.     Tenderness: There is generalized abdominal tenderness.  Skin:    General: Skin is warm and dry.  Neurological:     Mental Status: She is alert.     ED Results / Procedures / Treatments   Labs (all labs ordered are listed, but only abnormal results are displayed) Labs Reviewed  COMPREHENSIVE METABOLIC PANEL - Abnormal; Notable for the following components:      Result Value   Glucose, Bld 138 (*)    Calcium 10.4 (*)    AST 13 (*)    All other components within normal limits  CBC - Abnormal; Notable for the following components:   WBC 11.0 (*)    RBC 5.46 (*)     All other components within normal limits  LIPASE, BLOOD  URINALYSIS, ROUTINE W REFLEX MICROSCOPIC  PREGNANCY, URINE  LACTIC ACID, PLASMA  LACTIC ACID, PLASMA    EKG None  Radiology CT ABDOMEN PELVIS W CONTRAST  Result Date: 05/02/2022 CLINICAL DATA:  Abdominal pain, acute, nonlocalized. EXAM: CT ABDOMEN AND PELVIS WITH CONTRAST TECHNIQUE: Multidetector CT imaging of the abdomen and pelvis was performed using the standard protocol following bolus administration of intravenous contrast. RADIATION DOSE REDUCTION: This exam was performed according to the departmental dose-optimization program which includes automated exposure control, adjustment of the mA and/or kV according to patient size and/or use of iterative reconstruction technique. CONTRAST:  102m OMNIPAQUE IOHEXOL 300 MG/ML  SOLN COMPARISON:  None Available. FINDINGS: Lower chest: CT examination dated February 14, 2021 Hepatobiliary: No focal liver abnormality is seen. No gallstones, gallbladder wall thickening, or biliary dilatation. Pancreas: Unremarkable. No pancreatic ductal dilatation or surrounding inflammatory changes. Spleen: Normal in size without focal abnormality. Adrenals/Urinary Tract: Adrenal glands are unremarkable. Kidneys are normal, without renal calculi, focal lesion, or hydronephrosis. Bladder is unremarkable. Stomach/Bowel: Stomach is within normal limits. Appendix appears normal. No evidence of bowel wall thickening, distention, or inflammatory changes. Vascular/Lymphatic: There is a small filling defect in the suprarenal abdominal aorta, at the level of the crus of the diaphragm, which extends into the superior mesenteric artery suggesting small aortic thrombus (axial images 26-32). Mild atherosclerotic calcification of abdominal aorta and branch vessels which are however normal in caliber. Reproductive: Status post hysterectomy. No adnexal masses. Other: No abdominal wall hernia or abnormality. No abdominopelvic ascites.  Musculoskeletal: No acute or significant osseous findings. IMPRESSION: 1. There is a 0.5 x 4.5 cm filling defect in the proximal abdominal aorta at the level of the crus of the diaphragm which extends into the superior mesenteric artery, concerning for aortic thrombus. This may be secondary to localized atherosclerotic disease correlate with symptoms of bowel ischemia. Vascular surgery consultation for further management is suggested. 2. Mild atherosclerotic calcification of abdominal aorta and branch vessels which are however normal in caliber. 3. Bowel loops are normal in caliber. Normal appendix. No evidence of colitis or diverticulitis. 4. No evidence of nephrolithiasis or hydronephrosis. 5. Status post hysterectomy. 6. Aortic atherosclerosis. Above findings were reported to Dr. GRegenia Skeeterat approximately 11:30 p.m. on 05/02/2022. Electronically Signed   By: IKeane PoliceD.O.   On: 05/02/2022 23:31    Procedures Procedures    Medications Ordered in ED Medications  lactated ringers bolus 1,000 mL (has no administration in time range)  sodium chloride 0.9 % bolus 1,000 mL (1,000 mLs Intravenous New Bag/Given 05/02/22 2251)  HYDROmorphone (  DILAUDID) injection 1 mg (1 mg Intravenous Given 05/02/22 2244)  ondansetron (ZOFRAN) injection 4 mg (4 mg Intravenous Given 05/02/22 2244)  iohexol (OMNIPAQUE) 300 MG/ML solution 100 mL (85 mLs Intravenous Contrast Given 05/02/22 2257)    ED Course/ Medical Decision Making/ A&P                           Medical Decision Making Amount and/or Complexity of Data Reviewed Labs: ordered.    Details: Slight leukocytosis at 11 Radiology: ordered and independent interpretation performed.    Details: Thrombus in the aorta, question SMA involvement ECG/medicine tests: independent interpretation performed.    Details: No acute ischemia compared to baseline  Risk Prescription drug management.   Patient presents with severe abdominal pain.  When I first walked  in the room she was moving around and uncomfortable.  She was given IV Dilaudid and fluids.  She has diffuse abdominal pain though more on the right side.  CT images viewed/interpreted by myself and I discussed with radiology.  There is concern that she has proximal aorta clot that may be going to the SMA.  I discussed with vascular surgery, Dr. Carlis Abbott, who has reviewed the images.  Still seems relatively unclear on his interpretation he is asking for CTA and then we will consult back with him.  Care transferred to Dr. Sedonia Small.        Final Clinical Impression(s) / ED Diagnoses Final diagnoses:  None    Rx / DC Orders ED Discharge Orders     None         Sherwood Gambler, MD 05/02/22 2345

## 2022-05-02 NOTE — ED Provider Notes (Incomplete)
  Provider Note MRN:  [id number]  Arrival date & time: 05/03/22    ED Course and Medical Decision Making  Assumed care from Dr. Regenia Skeeter at shift change.  Severe abdominal pain, CT showing evidence of aortic thrombus, possibly SMA thrombus.  Have consulted vascular surgery, obtaining CTA and will discuss again with vascular.  1:30 AM update: CTA confirms aortic thrombus with extension into the SMA.  Nonocclusive.  On my assessment patient is sitting fairly comfortably, still having 7 out of 10 pain.  Vital signs normal.  Normal strength and sensation to the legs.  Discussed case again with Dr. Carlis Abbott who recommends starting heparin, transferring to [facility] emergency department to be evaluated by vascular surgery to determine urgency of procedural intervention.  Dr. Merrily Pew is the accepting ED provider at [facility].  Patient to be urgently transported to [facility].  .Critical Care  Performed by: Maudie Flakes, MD Authorized by: Maudie Flakes, MD   Critical care provider statement:    Critical care time (minutes):  45   Critical care was necessary to treat or prevent imminent or life-threatening deterioration of the following conditions: Aortic thrombus.   Critical care was time spent personally by me on the following activities:  Development of treatment plan with patient or surrogate, discussions with consultants, evaluation of patient's response to treatment, examination of patient, ordering and review of laboratory studies, ordering and review of radiographic studies, ordering and performing treatments and interventions, pulse oximetry, re-evaluation of patient's condition and review of old charts   Final Clinical Impressions(s) / ED Diagnoses     ICD-10-CM   1. 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 Generalized abdominal pain  R10.84       ED Discharge Orders     None       Discharge Instructions   None      Barth Kirks. Sedonia Small, [place] [email or web address]    Maudie Flakes, MD 05/03/22 0130    Maudie Flakes, MD 05/03/22 [phone]

## 2022-05-02 NOTE — ED Notes (Signed)
Pt now on 2L O2 via [place] after O2 desat to 90% (likely related to IVP dilaudid administered prior to CT)

## 2022-05-02 NOTE — ED Notes (Signed)
Pt sitting up leaning forward in bed - facial grimacing/moaning observed.  Pt reports ongoing generalized abd pain, sharp in nature- states worse on R side with associated nausea -denies diarrhea and denies urinary symptoms.  Pt denies trying new foods - stated started on Gabapentin, Percocet and Prednisone approx 1 week ago; unk if symptoms attributed to new meds.  Pt made aware of urine sample still needed; urine cup at bedside.  Pt requesting pain med however awaits provider eval first - this nurse will continue to monitor for acute changes and maintain plan of care.

## 2022-05-02 NOTE — ED Notes (Signed)
Patient transported to CT 

## 2022-05-02 NOTE — ED Triage Notes (Signed)
Pt c/o abd pain, states she's unsure "if my gallbladder busted or what, but it hurts so bad." States it started yesterday, worsening over 24hrs. Able to tolerate PO, but states she feels "extremely bloated, but unable to vomit." OTC medications ineffective.

## 2022-05-03 ENCOUNTER — Encounter (HOSPITAL_COMMUNITY)
Admission: EM | Disposition: A | Discharge status: Home Health | Payer: Self-pay | Source: Home / Self Care | Attending: Vascular Surgery

## 2022-05-03 ENCOUNTER — Emergency Department (HOSPITAL_COMMUNITY): Payer: Commercial Managed Care - PPO | Admitting: Critical Care Medicine

## 2022-05-03 ENCOUNTER — Other Ambulatory Visit: Payer: Self-pay

## 2022-05-03 ENCOUNTER — Emergency Department (HOSPITAL_BASED_OUTPATIENT_CLINIC_OR_DEPARTMENT_OTHER): Payer: Commercial Managed Care - PPO

## 2022-05-03 DIAGNOSIS — I252 Old myocardial infarction: Secondary | ICD-10-CM | POA: Diagnosis not present

## 2022-05-03 DIAGNOSIS — K55059 Acute (reversible) ischemia of intestine, part and extent unspecified: Secondary | ICD-10-CM | POA: Diagnosis present

## 2022-05-03 DIAGNOSIS — E785 Hyperlipidemia, unspecified: Secondary | ICD-10-CM | POA: Diagnosis present

## 2022-05-03 DIAGNOSIS — I748 Embolism and thrombosis of other arteries: Secondary | ICD-10-CM

## 2022-05-03 DIAGNOSIS — Z7982 Long term (current) use of aspirin: Secondary | ICD-10-CM | POA: Diagnosis not present

## 2022-05-03 DIAGNOSIS — K55021 Focal (segmental) acute infarction of small intestine: Secondary | ICD-10-CM | POA: Diagnosis present

## 2022-05-03 DIAGNOSIS — Z9889 Other specified postprocedural states: Secondary | ICD-10-CM | POA: Diagnosis not present

## 2022-05-03 DIAGNOSIS — J9601 Acute respiratory failure with hypoxia: Secondary | ICD-10-CM | POA: Diagnosis not present

## 2022-05-03 DIAGNOSIS — J9602 Acute respiratory failure with hypercapnia: Secondary | ICD-10-CM | POA: Diagnosis not present

## 2022-05-03 DIAGNOSIS — Z7902 Long term (current) use of antithrombotics/antiplatelets: Secondary | ICD-10-CM | POA: Diagnosis not present

## 2022-05-03 DIAGNOSIS — I7409 Other arterial embolism and thrombosis of abdominal aorta: Secondary | ICD-10-CM | POA: Diagnosis present

## 2022-05-03 DIAGNOSIS — I2510 Atherosclerotic heart disease of native coronary artery without angina pectoris: Secondary | ICD-10-CM

## 2022-05-03 DIAGNOSIS — F17210 Nicotine dependence, cigarettes, uncomplicated: Secondary | ICD-10-CM | POA: Diagnosis present

## 2022-05-03 DIAGNOSIS — D869 Sarcoidosis, unspecified: Secondary | ICD-10-CM | POA: Diagnosis present

## 2022-05-03 DIAGNOSIS — F17200 Nicotine dependence, unspecified, uncomplicated: Secondary | ICD-10-CM

## 2022-05-03 DIAGNOSIS — D649 Anemia, unspecified: Secondary | ICD-10-CM | POA: Diagnosis not present

## 2022-05-03 DIAGNOSIS — I7410 Embolism and thrombosis of unspecified parts of aorta: Secondary | ICD-10-CM | POA: Diagnosis not present

## 2022-05-03 DIAGNOSIS — Z885 Allergy status to narcotic agent status: Secondary | ICD-10-CM | POA: Diagnosis not present

## 2022-05-03 DIAGNOSIS — K55069 Acute infarction of intestine, part and extent unspecified: Secondary | ICD-10-CM | POA: Diagnosis not present

## 2022-05-03 DIAGNOSIS — I513 Intracardiac thrombosis, not elsewhere classified: Secondary | ICD-10-CM | POA: Diagnosis present

## 2022-05-03 DIAGNOSIS — Z8249 Family history of ischemic heart disease and other diseases of the circulatory system: Secondary | ICD-10-CM | POA: Diagnosis not present

## 2022-05-03 DIAGNOSIS — K9189 Other postprocedural complications and disorders of digestive system: Secondary | ICD-10-CM | POA: Diagnosis not present

## 2022-05-03 DIAGNOSIS — K661 Hemoperitoneum: Secondary | ICD-10-CM | POA: Diagnosis not present

## 2022-05-03 DIAGNOSIS — Z880 Allergy status to penicillin: Secondary | ICD-10-CM | POA: Diagnosis not present

## 2022-05-03 DIAGNOSIS — Z955 Presence of coronary angioplasty implant and graft: Secondary | ICD-10-CM | POA: Diagnosis not present

## 2022-05-03 DIAGNOSIS — R4182 Altered mental status, unspecified: Secondary | ICD-10-CM | POA: Diagnosis not present

## 2022-05-03 DIAGNOSIS — K658 Other peritonitis: Secondary | ICD-10-CM | POA: Diagnosis present

## 2022-05-03 DIAGNOSIS — R1084 Generalized abdominal pain: Secondary | ICD-10-CM | POA: Diagnosis present

## 2022-05-03 DIAGNOSIS — J9811 Atelectasis: Secondary | ICD-10-CM | POA: Diagnosis present

## 2022-05-03 DIAGNOSIS — I10 Essential (primary) hypertension: Secondary | ICD-10-CM | POA: Diagnosis present

## 2022-05-03 DIAGNOSIS — K567 Ileus, unspecified: Secondary | ICD-10-CM | POA: Diagnosis not present

## 2022-05-03 DIAGNOSIS — Z4889 Encounter for other specified surgical aftercare: Secondary | ICD-10-CM | POA: Insufficient documentation

## 2022-05-03 LAB — URINALYSIS, ROUTINE W REFLEX MICROSCOPIC
Bilirubin Urine: NEGATIVE
Glucose, UA: NEGATIVE mg/dL
Hgb urine dipstick: NEGATIVE
Ketones, ur: NEGATIVE mg/dL
Leukocytes,Ua: NEGATIVE
Nitrite: NEGATIVE
Protein, ur: NEGATIVE mg/dL
Specific Gravity, Urine: >1.046 — ABNORMAL HIGH (ref 1.005–1.030)
pH: 7.5 (ref 5.0–8.0)

## 2022-05-03 LAB — CBC WITH DIFFERENTIAL/PLATELET
Abs Immature Granulocytes: 0.06 10*3/uL (ref 0.00–0.07)
Basophils Absolute: 0 10*3/uL (ref 0.0–0.1)
Basophils Relative: 0 %
Eosinophils Absolute: 0 10*3/uL (ref 0.0–0.5)
Eosinophils Relative: 0 %
HCT: 37.7 % (ref 36.0–46.0)
Hemoglobin: 12.3 g/dL (ref 12.0–15.0)
Immature Granulocytes: 1 %
Lymphocytes Relative: 20 %
Lymphs Abs: 2 10*3/uL (ref 0.7–4.0)
MCH: 27 pg (ref 26.0–34.0)
MCHC: 32.6 g/dL (ref 30.0–36.0)
MCV: 82.7 fL (ref 80.0–100.0)
Monocytes Absolute: 0.7 10*3/uL (ref 0.1–1.0)
Monocytes Relative: 7 %
Neutro Abs: 7.1 10*3/uL (ref 1.7–7.7)
Neutrophils Relative %: 72 %
Platelets: 273 10*3/uL (ref 150–400)
RBC: 4.56 MIL/uL (ref 3.87–5.11)
RDW: 14.4 % (ref 11.5–15.5)
WBC: 9.9 10*3/uL (ref 4.0–10.5)
nRBC: 0 % (ref 0.0–0.2)

## 2022-05-03 LAB — BASIC METABOLIC PANEL
Anion gap: 9 (ref 5–15)
BUN: 7 mg/dL (ref 6–20)
CO2: 21 mmol/L — ABNORMAL LOW (ref 22–32)
Calcium: 9.5 mg/dL (ref 8.9–10.3)
Chloride: 108 mmol/L (ref 98–111)
Creatinine, Ser: 0.97 mg/dL (ref 0.44–1.00)
GFR, Estimated: >60 mL/min (ref >60)
Glucose, Bld: 154 mg/dL — ABNORMAL HIGH (ref 70–99)
Potassium: 4.5 mmol/L (ref 3.5–5.1)
Sodium: 138 mmol/L (ref 135–145)

## 2022-05-03 LAB — CBC
HCT: 39.1 % (ref 36.0–46.0)
Hemoglobin: 12.7 g/dL (ref 12.0–15.0)
MCH: 27.1 pg (ref 26.0–34.0)
MCHC: 32.5 g/dL (ref 30.0–36.0)
MCV: 83.5 fL (ref 80.0–100.0)
Platelets: 276 10*3/uL (ref 150–400)
RBC: 4.68 MIL/uL (ref 3.87–5.11)
RDW: 14.6 % (ref 11.5–15.5)
WBC: 11.5 10*3/uL — ABNORMAL HIGH (ref 4.0–10.5)
nRBC: 0 % (ref 0.0–0.2)

## 2022-05-03 LAB — POCT I-STAT 7, (LYTES, BLD GAS, ICA,H+H)
Acid-base deficit: 1 mmol/L (ref 0.0–2.0)
Bicarbonate: 24 mmol/L (ref 20.0–28.0)
Calcium, Ion: 1.3 mmol/L (ref 1.15–1.40)
HCT: 37 % (ref 36.0–46.0)
Hemoglobin: 12.6 g/dL (ref 12.0–15.0)
O2 Saturation: 100 %
Patient temperature: 35.6
Potassium: 4 mmol/L (ref 3.5–5.1)
Sodium: 136 mmol/L (ref 135–145)
TCO2: 25 mmol/L (ref 22–32)
pCO2 arterial: 38.4 mmHg (ref 32–48)
pH, Arterial: 7.398 (ref 7.35–7.45)
pO2, Arterial: 227 mmHg — ABNORMAL HIGH (ref 83–108)

## 2022-05-03 LAB — ABO/RH: ABO/RH(D): B POS

## 2022-05-03 LAB — PREPARE RBC (CROSSMATCH)

## 2022-05-03 LAB — HEPARIN LEVEL (UNFRACTIONATED): Heparin Unfractionated: >1.1 IU/mL — ABNORMAL HIGH (ref 0.30–0.70)

## 2022-05-03 LAB — POCT ACTIVATED CLOTTING TIME
Activated Clotting Time: 173 seconds
Activated Clotting Time: 251 seconds

## 2022-05-03 LAB — LACTIC ACID, PLASMA
Lactic Acid, Venous: 0.5 mmol/L (ref 0.5–1.9)
Lactic Acid, Venous: 1.7 mmol/L (ref 0.5–1.9)

## 2022-05-03 LAB — PREGNANCY, URINE: Preg Test, Ur: NEGATIVE

## 2022-05-03 SURGERY — THROMBECTOMY, PERCUTANEOUS, TRANSLUMINAL
Anesthesia: General | Site: Groin

## 2022-05-03 MED ORDER — DEXAMETHASONE SODIUM PHOSPHATE 10 MG/ML IJ SOLN
INTRAMUSCULAR | Status: DC | PRN
  Administered 2022-05-03: 4 mg via INTRAVENOUS

## 2022-05-03 MED ORDER — MIDAZOLAM HCL 2 MG/2ML IJ SOLN
INTRAMUSCULAR | Status: DC | PRN
  Administered 2022-05-03: 2 mg via INTRAVENOUS

## 2022-05-03 MED ORDER — LIDOCAINE 2% (20 MG/ML) 5 ML SYRINGE
INTRAMUSCULAR | Status: AC
  Filled 2022-05-03: qty 5

## 2022-05-03 MED ORDER — OXYCODONE HCL 5 MG PO TABS: 5.0000 mg | ORAL_TABLET | Freq: Once | ORAL | Status: DC | PRN

## 2022-05-03 MED ORDER — SODIUM CHLORIDE 0.9 % IV SOLN
12.5000 mg | Freq: Four times a day (QID) | INTRAVENOUS | Status: DC | PRN
  Administered 2022-05-03 – 2022-05-04 (×2): 12.5 mg via INTRAVENOUS
  Filled 2022-05-03 (×2): qty 0.5

## 2022-05-03 MED ORDER — DEXAMETHASONE SODIUM PHOSPHATE 10 MG/ML IJ SOLN
INTRAMUSCULAR | Status: AC
  Filled 2022-05-03: qty 1

## 2022-05-03 MED ORDER — LACTATED RINGERS IV SOLN: INTRAVENOUS | Status: DC | PRN

## 2022-05-03 MED ORDER — ESMOLOL HCL 100 MG/10ML IV SOLN
INTRAVENOUS | Status: AC
  Filled 2022-05-03: qty 10

## 2022-05-03 MED ORDER — MORPHINE SULFATE (PF) 2 MG/ML IV SOLN
1.0000 mg | INTRAVENOUS | Status: DC | PRN
  Administered 2022-05-03: 4 mg via INTRAVENOUS
  Administered 2022-05-03 – 2022-05-04 (×2): 2 mg via INTRAVENOUS
  Administered 2022-05-04: 1 mg via INTRAVENOUS
  Filled 2022-05-03 (×2): qty 2
  Filled 2022-05-03 (×2): qty 1

## 2022-05-03 MED ORDER — HEPARIN 6000 UNIT IRRIGATION SOLUTION
Status: DC | PRN
  Administered 2022-05-03: 2

## 2022-05-03 MED ORDER — ONDANSETRON HCL 4 MG/2ML IJ SOLN
INTRAMUSCULAR | Status: AC
  Filled 2022-05-03: qty 2

## 2022-05-03 MED ORDER — ALBUTEROL SULFATE HFA 108 (90 BASE) MCG/ACT IN AERS: 1.0000 | INHALATION_SPRAY | Freq: Four times a day (QID) | RESPIRATORY_TRACT | Status: DC | PRN

## 2022-05-03 MED ORDER — HEPARIN SODIUM (PORCINE) 1000 UNIT/ML IJ SOLN
INTRAMUSCULAR | Status: DC | PRN
  Administered 2022-05-03: 5000 [IU] via INTRAVENOUS

## 2022-05-03 MED ORDER — HEPARIN 6000 UNIT IRRIGATION SOLUTION
Status: AC
  Filled 2022-05-03: qty 500

## 2022-05-03 MED ORDER — OXYCODONE HCL 5 MG/5ML PO SOLN: 5.0000 mg | Freq: Once | ORAL | Status: DC | PRN

## 2022-05-03 MED ORDER — LIDOCAINE 2% (20 MG/ML) 5 ML SYRINGE
INTRAMUSCULAR | Status: DC | PRN
  Administered 2022-05-03: 40 mg via INTRAVENOUS

## 2022-05-03 MED ORDER — HEPARIN (PORCINE) 25000 UT/250ML-% IV SOLN: 1250.0000 [IU]/h | INTRAVENOUS | Status: DC

## 2022-05-03 MED ORDER — LABETALOL HCL 5 MG/ML IV SOLN
10.0000 mg | INTRAVENOUS | Status: AC | PRN
  Administered 2022-05-04 – 2022-05-05 (×4): 10 mg via INTRAVENOUS
  Filled 2022-05-03 (×3): qty 4

## 2022-05-03 MED ORDER — SODIUM CHLORIDE (PF) 0.9 % IJ SOLN
INTRAVENOUS | Status: DC | PRN
  Administered 2022-05-03: 90.9 mL via INTRAMUSCULAR

## 2022-05-03 MED ORDER — MIDAZOLAM HCL 2 MG/2ML IJ SOLN
INTRAMUSCULAR | Status: AC
  Filled 2022-05-03: qty 2

## 2022-05-03 MED ORDER — CLOPIDOGREL BISULFATE 300 MG PO TABS
300.0000 mg | ORAL_TABLET | Freq: Once | ORAL | Status: AC
  Administered 2022-05-03: 300 mg via ORAL
  Filled 2022-05-03 (×2): qty 1

## 2022-05-03 MED ORDER — ROCURONIUM BROMIDE 10 MG/ML (PF) SYRINGE
PREFILLED_SYRINGE | INTRAVENOUS | Status: AC
  Filled 2022-05-03: qty 10

## 2022-05-03 MED ORDER — OXYCODONE HCL 5 MG PO TABS
5.0000 mg | ORAL_TABLET | ORAL | Status: DC | PRN
  Filled 2022-05-03: qty 2

## 2022-05-03 MED ORDER — SODIUM CHLORIDE 0.9 % IV SOLN: 250.0000 mL | INTRAVENOUS | Status: DC | PRN

## 2022-05-03 MED ORDER — CLOPIDOGREL BISULFATE 75 MG PO TABS
75.0000 mg | ORAL_TABLET | Freq: Every day | ORAL | Status: DC
  Filled 2022-05-03: qty 1

## 2022-05-03 MED ORDER — FENTANYL CITRATE (PF) 250 MCG/5ML IJ SOLN
INTRAMUSCULAR | Status: DC | PRN
  Administered 2022-05-03: 150 ug via INTRAVENOUS
  Administered 2022-05-03: 50 ug via INTRAVENOUS

## 2022-05-03 MED ORDER — SODIUM CHLORIDE 0.9% FLUSH: 3.0000 mL | INTRAVENOUS | Status: DC | PRN

## 2022-05-03 MED ORDER — SODIUM CHLORIDE 0.9% FLUSH
3.0000 mL | Freq: Two times a day (BID) | INTRAVENOUS | Status: DC
  Administered 2022-05-03 – 2022-05-16 (×20): 3 mL via INTRAVENOUS

## 2022-05-03 MED ORDER — ONDANSETRON HCL 4 MG/2ML IJ SOLN
4.0000 mg | Freq: Four times a day (QID) | INTRAMUSCULAR | Status: AC | PRN
  Administered 2022-05-03: 4 mg via INTRAVENOUS

## 2022-05-03 MED ORDER — ONDANSETRON HCL 4 MG/2ML IJ SOLN
INTRAMUSCULAR | Status: DC | PRN
  Administered 2022-05-03: 4 mg via INTRAVENOUS

## 2022-05-03 MED ORDER — ESMOLOL HCL 100 MG/10ML IV SOLN
INTRAVENOUS | Status: DC | PRN
  Administered 2022-05-03 (×5): 20 mg via INTRAVENOUS

## 2022-05-03 MED ORDER — ALBUTEROL SULFATE (2.5 MG/3ML) 0.083% IN NEBU: 2.5000 mg | INHALATION_SOLUTION | Freq: Four times a day (QID) | RESPIRATORY_TRACT | Status: DC | PRN

## 2022-05-03 MED ORDER — PROPOFOL 10 MG/ML IV BOLUS
INTRAVENOUS | Status: AC
  Filled 2022-05-03: qty 20

## 2022-05-03 MED ORDER — FENTANYL CITRATE (PF) 100 MCG/2ML IJ SOLN: 25.0000 ug | INTRAMUSCULAR | Status: DC | PRN

## 2022-05-03 MED ORDER — ALUM & MAG HYDROXIDE-SIMETH 200-200-20 MG/5ML PO SUSP
30.0000 mL | ORAL | Status: DC | PRN
  Administered 2022-05-03: 30 mL via ORAL
  Filled 2022-05-03: qty 30

## 2022-05-03 MED ORDER — HEPARIN (PORCINE) 25000 UT/250ML-% IV SOLN
INTRAVENOUS | Status: AC
  Filled 2022-05-03: qty 250

## 2022-05-03 MED ORDER — ROCURONIUM BROMIDE 10 MG/ML (PF) SYRINGE
PREFILLED_SYRINGE | INTRAVENOUS | Status: DC | PRN
  Administered 2022-05-03: 50 mg via INTRAVENOUS

## 2022-05-03 MED ORDER — CEFAZOLIN SODIUM-DEXTROSE 2-3 GM-%(50ML) IV SOLR
INTRAVENOUS | Status: DC | PRN
  Administered 2022-05-03: 2 g via INTRAVENOUS

## 2022-05-03 MED ORDER — HYDRALAZINE HCL 20 MG/ML IJ SOLN
5.0000 mg | INTRAMUSCULAR | Status: DC | PRN
  Administered 2022-05-05: 5 mg via INTRAVENOUS
  Filled 2022-05-03: qty 1

## 2022-05-03 MED ORDER — PROPOFOL 10 MG/ML IV BOLUS
INTRAVENOUS | Status: DC | PRN
  Administered 2022-05-03: 160 mg via INTRAVENOUS
  Administered 2022-05-03: 40 mg via INTRAVENOUS

## 2022-05-03 MED ORDER — ASPIRIN 81 MG PO TBEC
81.0000 mg | DELAYED_RELEASE_TABLET | Freq: Every day | ORAL | Status: DC
  Administered 2022-05-03: 81 mg via ORAL
  Filled 2022-05-03 (×2): qty 1

## 2022-05-03 MED ORDER — PHENYLEPHRINE 80 MCG/ML (10ML) SYRINGE FOR IV PUSH (FOR BLOOD PRESSURE SUPPORT)
PREFILLED_SYRINGE | INTRAVENOUS | Status: DC | PRN
  Administered 2022-05-03: 80 ug via INTRAVENOUS
  Administered 2022-05-03: 40 ug via INTRAVENOUS

## 2022-05-03 MED ORDER — FENTANYL CITRATE (PF) 250 MCG/5ML IJ SOLN
INTRAMUSCULAR | Status: AC
  Filled 2022-05-03: qty 5

## 2022-05-03 MED ORDER — ACETAMINOPHEN 325 MG PO TABS: 650.0000 mg | ORAL_TABLET | ORAL | Status: DC | PRN

## 2022-05-03 MED ORDER — PHENYLEPHRINE HCL-NACL 20-0.9 MG/250ML-% IV SOLN
INTRAVENOUS | Status: DC | PRN
  Administered 2022-05-03: 15 ug/min via INTRAVENOUS

## 2022-05-03 MED ORDER — ONDANSETRON HCL 4 MG/2ML IJ SOLN
4.0000 mg | Freq: Four times a day (QID) | INTRAMUSCULAR | Status: DC | PRN
  Administered 2022-05-03 – 2022-05-04 (×3): 4 mg via INTRAVENOUS
  Filled 2022-05-03 (×3): qty 2

## 2022-05-03 MED ORDER — ATORVASTATIN CALCIUM 10 MG PO TABS
20.0000 mg | ORAL_TABLET | Freq: Every day | ORAL | Status: DC
  Administered 2022-05-03: 20 mg via ORAL
  Filled 2022-05-03: qty 2

## 2022-05-03 MED ORDER — IOHEXOL 350 MG/ML SOLN
100.0000 mL | Freq: Once | INTRAVENOUS | Status: AC | PRN
  Administered 2022-05-03: 80 mL via INTRAVENOUS

## 2022-05-03 MED ORDER — 0.9 % SODIUM CHLORIDE (POUR BTL) OPTIME
TOPICAL | Status: DC | PRN
  Administered 2022-05-03: 1000 mL

## 2022-05-03 MED ORDER — SUGAMMADEX SODIUM 200 MG/2ML IV SOLN
INTRAVENOUS | Status: DC | PRN
  Administered 2022-05-03: 200 mg via INTRAVENOUS

## 2022-05-03 MED ORDER — SODIUM CHLORIDE 0.9 % IV SOLN: INTRAVENOUS | Status: AC

## 2022-05-03 MED ORDER — HEPARIN BOLUS VIA INFUSION
5000.0000 [IU] | Freq: Once | INTRAVENOUS | Status: AC
  Administered 2022-05-03: 5000 [IU] via INTRAVENOUS

## 2022-05-03 MED ORDER — HEPARIN (PORCINE) 25000 UT/250ML-% IV SOLN: 1000.0000 [IU]/h | INTRAVENOUS | Status: DC

## 2022-05-03 MED ORDER — PHENYLEPHRINE 80 MCG/ML (10ML) SYRINGE FOR IV PUSH (FOR BLOOD PRESSURE SUPPORT)
PREFILLED_SYRINGE | INTRAVENOUS | Status: AC
  Filled 2022-05-03: qty 10

## 2022-05-03 SURGICAL SUPPLY — 47 items
ADH SKN CLS APL DERMABOND .7 (GAUZE/BANDAGES/DRESSINGS) ×1
APL PRP STRL LF DISP 70% ISPRP (MISCELLANEOUS) ×1
BAG COUNTER SPONGE SURGICOUNT (BAG) IMPLANT
BAG SPNG CNTER NS LX DISP (BAG)
CANISTER PENUMBRA ENGINE (MISCELLANEOUS) IMPLANT
CANISTER SUCT 3000ML PPV (MISCELLANEOUS) IMPLANT
CATH ANGIO 5F PIGTAIL 65CM (CATHETERS) IMPLANT
CATH BEACON 5 .035 65 KMP TIP (CATHETERS) IMPLANT
CATH LIGHTNING BOLT 7 130 (CATHETERS) IMPLANT
CATH VISIONS PV .035 IVUS (CATHETERS) IMPLANT
CHLORAPREP W/TINT 26 (MISCELLANEOUS) ×1 IMPLANT
CLOSURE MYNX CONTROL 6F/7F (Vascular Products) IMPLANT
COVER MAYO STAND STRL (DRAPES) ×1 IMPLANT
DERMABOND ADVANCED .7 DNX12 (GAUZE/BANDAGES/DRESSINGS) IMPLANT
DEVICE CLOSURE MYNXGRIP 6/7F (Vascular Products) IMPLANT
DEVICE INFLATION ENCORE 26 (MISCELLANEOUS) IMPLANT
DRAPE HALF SHEET 40X57 (DRAPES) IMPLANT
DRSG IV TEGADERM 3.5X4.5 STRL (GAUZE/BANDAGES/DRESSINGS) IMPLANT
GAUZE 4X4 16PLY ~~LOC~~+RFID DBL (SPONGE) IMPLANT
GLIDEWIRE ADV .035X260CM (WIRE) IMPLANT
GLOVE BIO SURGEON STRL SZ7.5 (GLOVE) ×1 IMPLANT
GLOVE INDICATOR 8.0 STRL GRN (GLOVE) ×1 IMPLANT
GOWN STRL REUS W/ TWL LRG LVL3 (GOWN DISPOSABLE) ×2 IMPLANT
GOWN STRL REUS W/ TWL XL LVL3 (GOWN DISPOSABLE) ×1 IMPLANT
GOWN STRL REUS W/TWL LRG LVL3 (GOWN DISPOSABLE) ×2
GOWN STRL REUS W/TWL XL LVL3 (GOWN DISPOSABLE) ×1
KIT BASIN OR (CUSTOM PROCEDURE TRAY) ×1 IMPLANT
KIT NAMIC PS PRESSURIZED FLUID (KITS) IMPLANT
NDL HYPO 25GX1X1/2 BEV (NEEDLE) IMPLANT
NEEDLE HYPO 25GX1X1/2 BEV (NEEDLE) IMPLANT
NS IRRIG 1000ML POUR BTL (IV SOLUTION) ×1 IMPLANT
PACK ENDO MINOR (CUSTOM PROCEDURE TRAY) ×1 IMPLANT
PAD ARMBOARD 7.5X6 YLW CONV (MISCELLANEOUS) ×2 IMPLANT
SET FLUSH CO2 (MISCELLANEOUS) IMPLANT
SET MICROPUNCTURE 5F STIFF (MISCELLANEOUS) ×1 IMPLANT
SHEATH BRITE TIP 7FRX11 (SHEATH) IMPLANT
SHEATH GUIDING 7F 55X73X9MM TD (SHEATH) IMPLANT
SHEATH PINNACLE 5F 10CM (SHEATH) ×1 IMPLANT
SHEATH PINNACLE 8F 10CM (SHEATH) IMPLANT
STENT VIABAHNBX 7X39X136 (Permanent Stent) IMPLANT
STOPCOCK MORSE 400PSI 3WAY (MISCELLANEOUS) IMPLANT
SYR CONTROL 10ML LL (SYRINGE) IMPLANT
TOWEL GREEN STERILE (TOWEL DISPOSABLE) ×1 IMPLANT
TOWEL GREEN STERILE FF (TOWEL DISPOSABLE) IMPLANT
TUBING INJECTOR 48 (MISCELLANEOUS) IMPLANT
WATER STERILE IRR 1000ML POUR (IV SOLUTION) IMPLANT
WIRE BENTSON .035X145CM (WIRE) ×1 IMPLANT

## 2022-05-03 NOTE — Anesthesia Preprocedure Evaluation (Signed)
Anesthesia Evaluation  Patient identified by MRN, date of birth, ID band Patient awake    Reviewed: Allergy & Precautions, H&P , NPO status , Patient's Chart, lab work & pertinent test results  Airway Mallampati: II   Neck ROM: full    Dental   Pulmonary Current Smoker   breath sounds clear to auscultation       Cardiovascular + CAD, + Past MI, + Cardiac Stents and + Peripheral Vascular Disease   Rhythm:regular Rate:Normal  Stent in left circumflex 2019.  TTE (11/2021): EF 60%, normal valves   Neuro/Psych    GI/Hepatic   Endo/Other    Renal/GU      Musculoskeletal   Abdominal   Peds  Hematology  (+) Blood dyscrasia, anemia   Anesthesia Other Findings   Reproductive/Obstetrics                             Anesthesia Physical Anesthesia Plan  ASA: 3  Anesthesia Plan: General   Post-op Pain Management:    Induction: Intravenous  PONV Risk Score and Plan: 2 and Ondansetron, Dexamethasone, Midazolam and Treatment may vary due to age or medical condition  Airway Management Planned: Oral ETT  Additional Equipment:   Intra-op Plan:   Post-operative Plan: Extubation in OR  Informed Consent: I have reviewed the patients History and Physical, chart, labs and discussed the procedure including the risks, benefits and alternatives for the proposed anesthesia with the patient or authorized representative who has indicated his/her understanding and acceptance.     Dental advisory given  Plan Discussed with: CRNA, Anesthesiologist and Surgeon  Anesthesia Plan Comments:        Anesthesia Quick Evaluation

## 2022-05-03 NOTE — Anesthesia Procedure Notes (Signed)
Arterial Line Insertion Start/End11/23/2023 7:20 AM, 05/03/2022 7:25 AM Performed by: Wilburn Cornelia, CRNA, CRNA  Patient location: Pre-op. Preanesthetic checklist: patient identified, IV checked, site marked, risks and benefits discussed, surgical consent, monitors and equipment checked, pre-op evaluation, timeout performed and anesthesia consent Lidocaine 1% used for infiltration Left, radial was placed Catheter size: 20 G Hand hygiene performed  and maximum sterile barriers used  Allen's test indicative of satisfactory collateral circulation Attempts: 2 Procedure performed without using ultrasound guided technique. Following insertion, Biopatch and dressing applied. Post procedure assessment: normal  Patient tolerated the procedure with difficulty.

## 2022-05-03 NOTE — Progress Notes (Signed)
  Transition of Care [facility]) Screening Note   Patient Details  Name: [name] Date of Birth: 13-Oct-1965   Transition of Care [facility]) CM/SW Contact:    Ella Bodo, RN Phone Number: 05/03/2022, 12:39 PM    Transition of Care Department [facility]) has reviewed patient and no TOC needs have been identified at this time. We will continue to monitor patient advancement through interdisciplinary progression rounds. If new patient transition needs arise, please place a TOC consult.  Reinaldo Raddle, RN, BSN  Trauma/Neuro ICU Case Manager [phone]

## 2022-05-03 NOTE — Progress Notes (Signed)
ANTICOAGULATION CONSULT NOTE - Initial Consult  Pharmacy Consult for heparin Indication:  aortic/SMA thrombus , mesenteric ischemia  Allergies  Allergen Reactions   Penicillins Itching    Caused rash and blisters   Hydrocodone Hives and Itching    Patient Measurements: Height: '5\' 4"'$  (162.6 cm) Weight: 90.7 kg (200 lb) IBW/kg (Calculated) : 54.7 Heparin Dosing Weight: 75kg  Vital Signs: Temp: 97.4 F (36.3 C) (11/23 1015) Temp Source: Oral (11/23 0600) BP: 151/70 (11/23 1015) Pulse Rate: 82 (11/23 1015)  Labs: Recent Labs    05/02/22 2041 05/03/22 0814  HGB 14.7 12.6  HCT 43.9 37.0  PLT 285  --   CREATININE 0.88  --      Estimated Creatinine Clearance: 77.9 mL/min (by C-G formula based on SCr of 0.88 mg/dL).   Medical History: Past Medical History:  Diagnosis Date   Anemia    Fibroid    Hyperlipidemia    NSTEMI (non-ST elevated myocardial infarction) ([place]) 06/2017   s/p DES to LCX 07/02/17   Sarcoidosis     Assessment: 56yo female c/o abdominal pain >> CT reveals thrombus within upper AA extending into SMA. Patient underwent percutaneous mechanical thrombectomy/stent of SMA on 11/23. Pharmacy consulted to restart heparin now that patient is post-procedure.   Prior to procedure, patient was loaded with 5000 units of heparin and started on 1400 units/hr. Now that patient is post procedure, will decrease gtt rate to ~16.'5mg'$ /kg due to higher bleed risk. Hgb 12.6, plt 285--stable.  Goal of Therapy:  Heparin level 0.3-0.7 units/ml Monitor platelets by anticoagulation protocol: Yes   Plan:  Start heparin gtt @ 1250 units/hr  Check heparin level in 6hrs Monitor heparin level, CBC, and s/sx of bleeding daily.  Billey Gosling, PharmD PGY1 Pharmacy Resident 11/23/202310:55 AM

## 2022-05-03 NOTE — Progress Notes (Signed)
ANTICOAGULATION CONSULT NOTE - Initial Consult  Pharmacy Consult for heparin Indication:  aortic/SMA thrombus  Allergies  Allergen Reactions   Penicillins Itching    Caused rash and blisters   Hydrocodone Hives and Itching    Patient Measurements: Height: '5\' 4"'$  (162.6 cm) Weight: 90.7 kg (200 lb) IBW/kg (Calculated) : 54.7 Heparin Dosing Weight: 75kg  Vital Signs: Temp: 97.9 F (36.6 C) (11/23 0025) Temp Source: Oral (11/23 0025) BP: 158/102 (11/23 0025) Pulse Rate: 72 (11/23 0025)  Labs: Recent Labs    05/02/22 2041  HGB 14.7  HCT 43.9  PLT 285  CREATININE 0.88    Estimated Creatinine Clearance: 77.9 mL/min (by C-G formula based on SCr of 0.88 mg/dL).   Medical History: Past Medical History:  Diagnosis Date   Anemia    Fibroid    Hyperlipidemia    NSTEMI (non-ST elevated myocardial infarction) ([place]) 06/2017   s/p DES to LCX 07/02/17   Sarcoidosis     Assessment: 56yo female c/o abdominal pain >> CT reveals thrombus within upper AA extending into SMA >> to begin heparin.  Goal of Therapy:  Heparin level 0.3-0.7 units/ml Monitor platelets by anticoagulation protocol: Yes   Plan:  Heparin 5000 units IV bolus x1 followed by infusion at 1400 units/hr. Monitor heparin levels and CBC.  Wynona Neat, PharmD, BCPS  05/03/2022,1:21 AM

## 2022-05-03 NOTE — ED Notes (Addendum)
Pt arrives via [facility] to see vascular team for aortic thrombus with SMA involvement. '4mg'$  morphine given en route with no relief. Pt arrives with heparin infusing at 1400 units/hr through 22g in L forearm. Pt c/o 8/10 pain on arrival.  BP 180/90, HR 80, Spo2 98% Ra, RR 20

## 2022-05-03 NOTE — ED Notes (Signed)
Pt to and from CT via stretcher on continuous cardiac and pulse ox monitoring escorted by this nurse -- Upon return from CT pt reports new onset heartburn and states pain and nausea med wearing off- Dr. Sedonia Small notified via secure chat and this nurse has requested heartburn med as well as pain and nausea med.  Pt also reports last BM one day prior to arrival - stool described as "almost black" in color; will notify Dr. Sedonia Small

## 2022-05-03 NOTE — Consult Note (Signed)
Reason for Consult: Abdominal pain Referring Physician: [name] is an 56 y.o. female.  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  Patient has history of CAD tobacco use.  Patient was seen in outside ER was transferred here secondary to nausea abdominal pain.  Patient complained of increasing pain even with IV narcotics.  Patient was found to have SMA embolus.  Patient underwent endovascular intervention by Dr. Carlis Abbott this morning.  Patient had pain this afternoon/evening.  She does state that she was taking a diet.  She had some nausea emesis thereafter.  Patient currently states that her pain is fairly intense.  She states it is fairly generalized however has more right-sided than left-sided pain.  She has been getting morphine.  Patient did undergo laboratory studies.  Patient was found to have no leukocytosis with normal lactate.  Of note patient on heparin drip and received her Plavix this afternoon.    Past Medical History:  Diagnosis Date   Anemia    Fibroid    Hyperlipidemia    NSTEMI (non-ST elevated myocardial infarction) ([place]) 06/2017   s/p DES to LCX 07/02/17   Sarcoidosis     Past Surgical History:  Procedure Laterality Date   ABDOMINAL HYSTERECTOMY     BREAST BIOPSY Right 03/24/2018   BREAST REDUCTION SURGERY Bilateral 10/17/2020   Procedure: MAMMARY REDUCTION  (BREAST);  Surgeon: Cindra Presume, MD;  Location: [facility];  Service: Plastics;  Laterality: Bilateral;  2 hours   CESAREAN SECTION  1987   CORONARY/GRAFT ACUTE MI REVASCULARIZATION N/A 06/30/2017   Procedure: Coronary/Graft Acute MI Revascularization;  Surgeon: Lorretta Harp, MD;  Location: [facility];  Service: Cardiovascular;  Laterality: N/A;   FOREARM FRACTURE SURGERY Right 1977   FRACTURE SURGERY     LEFT HEART CATH AND CORONARY ANGIOGRAPHY N/A 06/30/2017   Procedure: LEFT HEART CATH AND CORONARY ANGIOGRAPHY;  Surgeon: Lorretta Harp, MD;  Location: [facility];  Service: Cardiovascular;  Laterality: N/A;   LEFT HEART CATH AND CORONARY ANGIOGRAPHY N/A 12/23/2017   Procedure: LEFT HEART CATH AND CORONARY ANGIOGRAPHY;  Surgeon: Lorretta Harp, MD;  Location: [facility];  Service: Cardiovascular;  Laterality: N/A;   TUBAL LIGATION  1990    Family History  Problem Relation Age of Onset   Diabetes Mother    Stroke Mother 83   Hypertension Mother    Hyperlipidemia Father    Stroke Father 44   Hypertension Father    Breast cancer Paternal Grandmother     Social History:  reports that she has been smoking cigarettes. She has a 29.00 pack-year smoking history. She has never used smokeless tobacco. She reports current alcohol use. She reports that she does not use drugs.  Allergies:  Allergies  Allergen Reactions   Penicillins Itching    Caused rash and blisters   Hydrocodone Hives and Itching    Medications: I have reviewed the patient's current medications.  Results for orders placed or performed during the hospital encounter of 05/02/22 (from the past 48 hour(s))  Lipase, blood     Status: None   Collection Time: 05/02/22  8:41 PM  Result Value Ref Range   Lipase 22 11 - 51 U/L    Comment: Performed at [facility], [address], [place],  [address]  Comprehensive metabolic panel     Status: Abnormal   Collection Time: 05/02/22  8:41 PM  Result Value Ref Range   Sodium 137 135 - 145 mmol/L   Potassium 4.3 3.5 - 5.1 mmol/L   Chloride 103 98 - 111 mmol/L   CO2 24 22 - 32 mmol/L   Glucose, Bld 138 (H) 70 - 99 mg/dL    Comment: Glucose reference range applies only to samples taken after fasting for at least 8 hours.   BUN 9 6 - 20 mg/dL   Creatinine, Ser 0.88 0.44 - 1.00 mg/dL   Calcium 10.4 (H) 8.9 - 10.3 mg/dL   Total Protein 7.2 6.5 - 8.1 g/dL   Albumin 4.1 3.5 - 5.0 g/dL   AST 13 (L) 15 - 41 U/L   ALT 19 0 - 44 U/L   Alkaline Phosphatase 110 38 -  126 U/L   Total Bilirubin 0.5 0.3 - 1.2 mg/dL   GFR, Estimated >60 >60 mL/min    Comment: (NOTE) Calculated using the CKD-EPI Creatinine Equation (2021)    Anion gap 10 5 - 15    Comment: Performed at [facility], [address], [place], [place] [address]  CBC     Status: Abnormal   Collection Time: 05/02/22  8:41 PM  Result Value Ref Range   WBC 11.0 (H) 4.0 - 10.5 K/uL   RBC 5.46 (H) 3.87 - 5.11 MIL/uL   Hemoglobin 14.7 12.0 - 15.0 g/dL   HCT 43.9 36.0 - 46.0 %   MCV 80.4 80.0 - 100.0 fL   MCH 26.9 26.0 - 34.0 pg   MCHC 33.5 30.0 - 36.0 g/dL   RDW 14.2 11.5 - 15.5 %   Platelets 285 150 - 400 K/uL   nRBC 0.0 0.0 - 0.2 %    Comment: Performed at [facility], [address] [place], [place] [address]  Lactic acid, plasma     Status: None   Collection Time: 05/03/22 12:34 AM  Result Value Ref Range   Lactic Acid, Venous 0.5 0.5 - 1.9 mmol/L    Comment: Performed at [facility], [place], [place] [address]  Urinalysis, Routine w reflex microscopic Urine, Clean Catch     Status: Abnormal   Collection Time: 05/03/22  1:28 AM  Result Value Ref Range   Color, Urine COLORLESS (A) YELLOW   APPearance CLEAR CLEAR   Specific Gravity, Urine >1.046 (H) 1.005 - 1.030   pH 7.5 5.0 - 8.0   Glucose, UA NEGATIVE NEGATIVE mg/dL   Hgb urine dipstick NEGATIVE NEGATIVE   Bilirubin Urine NEGATIVE NEGATIVE   Ketones, ur NEGATIVE NEGATIVE mg/dL   Protein, ur NEGATIVE NEGATIVE mg/dL   Nitrite NEGATIVE NEGATIVE   Leukocytes,Ua NEGATIVE NEGATIVE    Comment: Performed at [facility], [address] [place], [place] [address]  Pregnancy, urine     Status: None   Collection Time: 05/03/22  1:28 AM  Result Value Ref Range   Preg Test, Ur NEGATIVE NEGATIVE    Comment:        THE SENSITIVITY OF THIS METHODOLOGY IS >20 mIU/mL. Performed at [facility], [address]  [place],  [address]   Type and screen [place]     Status: None (Preliminary result)   Collection Time: 05/03/22  7:05 AM  Result Value Ref Range   ABO/RH(D) B POS    Antibody Screen NEG    Sample Expiration 05/06/2022,2359    Unit Number [id number]    Blood Component Type RED CELLS,LR  Unit division 00    Status of Unit ALLOCATED    Transfusion Status OK TO TRANSFUSE    Crossmatch Result Compatible    Unit Number [id number]    Blood Component Type RED CELLS,LR    Unit division 00    Status of Unit ALLOCATED    Transfusion Status OK TO TRANSFUSE    Crossmatch Result      Compatible Performed at [facility], [place] [address], [place], [place] [address]    Unit Number [id number]    Blood Component Type RED CELLS,LR    Unit division 00    Status of Unit ALLOCATED    Transfusion Status OK TO TRANSFUSE    Crossmatch Result Compatible    Unit Number [id number]    Blood Component Type RED CELLS,LR    Unit division 00    Status of Unit ALLOCATED    Transfusion Status OK TO TRANSFUSE    Crossmatch Result Compatible   ABO/Rh     Status: None   Collection Time: 05/03/22  7:10 AM  Result Value Ref Range   ABO/RH(D)      B POS Performed at [facility], [place] [address] [place], [place] [address]   Prepare RBC (crossmatch)     Status: None   Collection Time: 05/03/22  7:54 AM  Result Value Ref Range   Order Confirmation      ORDER PROCESSED BY BLOOD BANK Performed at [facility], [place] [address], [place],  [address]   POCT Activated clotting time     Status: None   Collection Time: 05/03/22  8:09 AM  Result Value Ref Range   Activated Clotting Time 173 seconds    Comment: Reference range 74-137 seconds for patients not on anticoagulant therapy.  I-STAT 7, (LYTES, BLD GAS, ICA, H+H)     Status: Abnormal   Collection Time: 05/03/22  8:14 AM  Result Value Ref Range   pH, Arterial 7.398 7.35 - 7.45   pCO2 arterial 38.4  32 - 48 mmHg   pO2, Arterial 227 (H) 83 - 108 mmHg   Bicarbonate 24.0 20.0 - 28.0 mmol/L   TCO2 25 22 - 32 mmol/L   O2 Saturation 100 %   Acid-base deficit 1.0 0.0 - 2.0 mmol/L   Sodium 136 135 - 145 mmol/L   Potassium 4.0 3.5 - 5.1 mmol/L   Calcium, Ion 1.30 1.15 - 1.40 mmol/L   HCT 37.0 36.0 - 46.0 %   Hemoglobin 12.6 12.0 - 15.0 g/dL   Patient temperature 35.6 C    Sample type ARTERIAL   POCT Activated clotting time     Status: None   Collection Time: 05/03/22  8:26 AM  Result Value Ref Range   Activated Clotting Time 251 seconds    Comment: Reference range 74-137 seconds for patients not on anticoagulant therapy.  CBC     Status: Abnormal   Collection Time: 05/03/22  5:46 PM  Result Value Ref Range   WBC 11.5 (H) 4.0 - 10.5 K/uL   RBC 4.68 3.87 - 5.11 MIL/uL   Hemoglobin 12.7 12.0 - 15.0 g/dL   HCT 39.1 36.0 - 46.0 %   MCV 83.5 80.0 - 100.0 fL   MCH 27.1 26.0 - 34.0 pg   MCHC 32.5 30.0 - 36.0 g/dL   RDW 14.6 11.5 - 15.5 %   Platelets 276 150 - 400 K/uL   nRBC 0.0 0.0 - 0.2 %    Comment: Performed at  [facility], [place] [address] [place], [place] [address]  Heparin level (unfractionated)     Status: Abnormal   Collection Time: 05/03/22  6:35 PM  Result Value Ref Range   Heparin Unfractionated >1.10 (H) 0.30 - 0.70 IU/mL    Comment: (NOTE) The clinical reportable range upper limit is being lowered to >1.10 to align with the FDA approved guidance for the current laboratory assay.  If heparin results are below expected values, and patient dosage has  been confirmed, suggest follow up testing of antithrombin III levels. Performed at [facility], [place] [address] [place], [place] [address]   CBC with Differential/Platelet     Status: None   Collection Time: 05/03/22  7:57 PM  Result Value Ref Range   WBC 9.9 4.0 - 10.5 K/uL   RBC 4.56 3.87 - 5.11 MIL/uL   Hemoglobin 12.3 12.0 - 15.0 g/dL   HCT 37.7 36.0 - 46.0 %   MCV 82.7 80.0 - 100.0 fL   MCH 27.0  26.0 - 34.0 pg   MCHC 32.6 30.0 - 36.0 g/dL   RDW 14.4 11.5 - 15.5 %   Platelets 273 150 - 400 K/uL   nRBC 0.0 0.0 - 0.2 %   Neutrophils Relative % 72 %   Neutro Abs 7.1 1.7 - 7.7 K/uL   Lymphocytes Relative 20 %   Lymphs Abs 2.0 0.7 - 4.0 K/uL   Monocytes Relative 7 %   Monocytes Absolute 0.7 0.1 - 1.0 K/uL   Eosinophils Relative 0 %   Eosinophils Absolute 0.0 0.0 - 0.5 K/uL   Basophils Relative 0 %   Basophils Absolute 0.0 0.0 - 0.1 K/uL   Immature Granulocytes 1 %   Abs Immature Granulocytes 0.06 0.00 - 0.07 K/uL    Comment: Performed at [facility], [address], [place], [place] [address]  Lactic acid, plasma     Status: None   Collection Time: 05/03/22  7:57 PM  Result Value Ref Range   Lactic Acid, Venous 1.7 0.5 - 1.9 mmol/L    Comment: Performed at [facility] [address], [place], [place] [address]  Basic metabolic panel     Status: Abnormal   Collection Time: 05/03/22  7:57 PM  Result Value Ref Range   Sodium 138 135 - 145 mmol/L   Potassium 4.5 3.5 - 5.1 mmol/L   Chloride 108 98 - 111 mmol/L   CO2 21 (L) 22 - 32 mmol/L   Glucose, Bld 154 (H) 70 - 99 mg/dL    Comment: Glucose reference range applies only to samples taken after fasting for at least 8 hours.   BUN 7 6 - 20 mg/dL   Creatinine, Ser 0.97 0.44 - 1.00 mg/dL   Calcium 9.5 8.9 - 10.3 mg/dL   GFR, Estimated >60 >60 mL/min    Comment: (NOTE) Calculated using the CKD-EPI Creatinine Equation (2021)    Anion gap 9 5 - 15    Comment: Performed at [facility] [address] [place], [place] [address]    HYBRID OR IMAGING [facility] ONLY)  Result Date: 05/03/2022 There is no interpretation for this exam.  This order is for images obtained during a surgical procedure.  Please See "Surgeries" Tab for more information regarding the procedure.   CT Angio Abd/Pel W and/or Wo Contrast  Result Date: 05/03/2022 CLINICAL DATA:  Acute mesenteric ischemia.  Evaluate for thrombus. EXAM: CTA  ABDOMEN AND PELVIS WITHOUT AND WITH  CONTRAST TECHNIQUE: Multidetector CT imaging of the abdomen and pelvis was performed using the standard protocol during bolus administration of intravenous contrast. Multiplanar reconstructed images and MIPs were obtained and reviewed to evaluate the vascular anatomy. RADIATION DOSE REDUCTION: This exam was performed according to the departmental dose-optimization program which includes automated exposure control, adjustment of the mA and/or kV according to patient size and/or use of iterative reconstruction technique. CONTRAST:  77m OMNIPAQUE IOHEXOL 350 MG/ML SOLN COMPARISON:  CT abdomen and pelvis 05/02/2022 FINDINGS: VASCULAR Aorta: There is noncalcified plaque or thrombus in the right side of the abdominal aorta at the level of the diaphragm causing approximately 30% narrowing. There is no evidence for aortic dissection or aneurysm. There is mild calcified atherosclerotic disease throughout the aorta. Celiac: Patent without evidence of aneurysm, dissection, vasculitis or significant stenosis. SMA: Thrombus extends from the aorta into the proximal superior mesenteric artery are proximally 3.5 cm. The artery is otherwise patent. There is no evidence for aneurysm or surrounding inflammation. Renals: Both renal arteries are patent without evidence of aneurysm, dissection, vasculitis, fibromuscular dysplasia or significant stenosis. IMA: Patent without evidence of aneurysm, dissection, vasculitis or significant stenosis. Inflow: Patent without evidence of aneurysm, dissection, vasculitis or significant stenosis. Proximal Outflow: Bilateral common femoral and visualized portions of the superficial and profunda femoral arteries are patent without evidence of aneurysm, dissection, vasculitis or significant stenosis. Veins: Portal venous system appears grossly patent as can be seen. IVC is not well opacified, but normal in size. Review of the MIP images confirms the above findings.  NON-VASCULAR Lower chest: There is minimal atelectasis in the lung bases. Hepatobiliary: No focal liver abnormality is seen. No gallstones, gallbladder wall thickening, or biliary dilatation. Pancreas: Unremarkable. No pancreatic ductal dilatation or surrounding inflammatory changes. Spleen: Normal in size without focal abnormality. Adrenals/Urinary Tract: Adrenal glands are unremarkable. Kidneys are normal, without renal calculi, focal lesion, or hydronephrosis. Bladder is unremarkable. Stomach/Bowel: Stomach is within normal limits. Appendix appears normal. No evidence of bowel wall thickening, distention, or inflammatory changes. There is sigmoid and descending colon diverticulosis. Lymphatic: No enlarged lymph nodes are seen. Reproductive: Status post hysterectomy. No adnexal masses. Other: There is a small fat containing umbilical hernia. No free fluid. Musculoskeletal: No acute or significant osseous findings. IMPRESSION: 1. Again seen is thrombus within the upper abdominal aorta extending into the proximal 3 cm of the superior mesenteric artery. This is nonocclusive. Vascular consultation recommended. 2. No evidence for aortic dissection or aneurysm. NON-VASCULAR 1. No other acute localizing process in the abdomen or pelvis. 2. Colonic diverticulosis. Electronically Signed   By: ARonney AstersM.D.   On: 05/03/2022 00:58   CT ABDOMEN PELVIS W CONTRAST  Result Date: 05/02/2022 CLINICAL DATA:  Abdominal pain, acute, nonlocalized. EXAM: CT ABDOMEN AND PELVIS WITH CONTRAST TECHNIQUE: Multidetector CT imaging of the abdomen and pelvis was performed using the standard protocol following bolus administration of intravenous contrast. RADIATION DOSE REDUCTION: This exam was performed according to the departmental dose-optimization program which includes automated exposure control, adjustment of the mA and/or kV according to patient size and/or use of iterative reconstruction technique. CONTRAST:  817mOMNIPAQUE  IOHEXOL 300 MG/ML  SOLN COMPARISON:  None Available. FINDINGS: Lower chest: CT examination dated February 14, 2021 Hepatobiliary: No focal liver abnormality is seen. No gallstones, gallbladder wall thickening, or biliary dilatation. Pancreas: Unremarkable. No pancreatic ductal dilatation or surrounding inflammatory changes. Spleen: Normal in size without focal abnormality. Adrenals/Urinary Tract: Adrenal glands are unremarkable. Kidneys are normal, without renal calculi, focal  lesion, or hydronephrosis. Bladder is unremarkable. Stomach/Bowel: Stomach is within normal limits. Appendix appears normal. No evidence of bowel wall thickening, distention, or inflammatory changes. Vascular/Lymphatic: There is a small filling defect in the suprarenal abdominal aorta, at the level of the crus of the diaphragm, which extends into the superior mesenteric artery suggesting small aortic thrombus (axial images 26-32). Mild atherosclerotic calcification of abdominal aorta and branch vessels which are however normal in caliber. Reproductive: Status post hysterectomy. No adnexal masses. Other: No abdominal wall hernia or abnormality. No abdominopelvic ascites. Musculoskeletal: No acute or significant osseous findings. IMPRESSION: 1. There is a 0.5 x 4.5 cm filling defect in the proximal abdominal aorta at the level of the crus of the diaphragm which extends into the superior mesenteric artery, concerning for aortic thrombus. This may be secondary to localized atherosclerotic disease correlate with symptoms of bowel ischemia. Vascular surgery consultation for further management is suggested. 2. Mild atherosclerotic calcification of abdominal aorta and branch vessels which are however normal in caliber. 3. Bowel loops are normal in caliber. Normal appendix. No evidence of colitis or diverticulitis. 4. No evidence of nephrolithiasis or hydronephrosis. 5. Status post hysterectomy. 6. Aortic atherosclerosis. Above findings were reported  to Dr. Regenia Skeeter at approximately 11:30 p.m. on 05/02/2022. Electronically Signed   By: Keane Police D.O.   On: 05/02/2022 23:31    Review of Systems  Constitutional:  Negative for chills and fever.  HENT:  Negative for ear discharge, hearing loss and sore throat.   Eyes:  Negative for discharge.  Respiratory:  Negative for cough and shortness of breath.   Cardiovascular:  Negative for chest pain and leg swelling.  Gastrointestinal:  Negative for abdominal pain, constipation, diarrhea, nausea and vomiting.  Musculoskeletal:  Negative for myalgias and neck pain.  Skin:  Negative for rash.  Allergic/Immunologic: Negative for environmental allergies.  Neurological:  Negative for dizziness and seizures.  Hematological:  Does not bruise/bleed easily.  Psychiatric/Behavioral:  Negative for suicidal ideas.   All other systems reviewed and are negative.  Blood pressure (!) 146/104, pulse (!) 105, temperature 97.8 F (36.6 C), temperature source Oral, resp. rate 20, height '5\' 4"'$  (1.626 m), weight 90.7 kg, SpO2 93 %. Physical Exam Constitutional:      Appearance: She is well-developed.     Comments: Conversant No acute distress  HENT:     Head: Normocephalic and atraumatic.  Eyes:     General: Lids are normal. No scleral icterus.    Pupils: Pupils are equal, round, and reactive to light.     Comments: Pupils are equal round and reactive No lid lag Moist conjunctiva  Neck:     Thyroid: No thyromegaly.     Trachea: No tracheal tenderness.     Comments: No cervical lymphadenopathy Cardiovascular:     Rate and Rhythm: Normal rate and regular rhythm.     Heart sounds: No murmur heard. Pulmonary:     Effort: Pulmonary effort is normal.     Breath sounds: Normal breath sounds. No wheezing or rales.  Abdominal:     Tenderness: There is generalized abdominal tenderness. There is no guarding or rebound.     Hernia: No hernia is present.  Musculoskeletal:     Cervical back: Normal range of  motion and neck supple.  Skin:    General: Skin is warm.     Findings: No rash.     Nails: There is no clubbing.     Comments: Normal skin turgor  Neurological:  Mental Status: She is alert and oriented to person, place, and time.     Comments: Normal gait and station  Psychiatric:        Mood and Affect: Mood normal.        Thought Content: Thought content normal.        Judgment: Judgment normal.     Comments: Appropriate affect     Assessment/Plan: 56 year old female status post SMA embolectomy, with abdominal pain. Patient currently on heparin drip and recently had her Plavix.  Difficult to assess whether or not patient to have a reperfusion type physiology and taking p.o.  I discussed with Dr. Trula Slade and likely no role for CT scan at this point.  We will repeat serial abdominal exams and lactate.  If patient's pain continues with elevated lactate we will proceed to the operating for diagnostic laparoscopy.  I discussed with the patient and her daughter at the bedside.  All questions were answered.  We will continue to follow.  Ralene Ok 05/03/2022, 9:23 PM

## 2022-05-03 NOTE — Progress Notes (Signed)
Pt bedrest over.  Assisted out of bed to the bathroom.  Site assessed before and after.  Stable, with oozing noted.  Dressing reinforced.  Pt assisted to order food and all other comfort needs addressed.  Family at bedside.  Will cont plan of care.

## 2022-05-03 NOTE — Progress Notes (Signed)
Called to patient's room c/o of severe abd pain. Patient stated that it was a 20 (0-10) scale. Patient also had some emesis that contained food. Zofran given.  Paged Dr. Trula Slade. Orders for stat CBC/lactic acid.   MD at bedside. Requested IV pain med. Dr. Trula Slade will consult general surgery to assess patient

## 2022-05-03 NOTE — H&P (Signed)
H&P    Reason for Consult: SMA thrombus Referring Physician: [facility] MRN #:  [id number]  History of Present Illness: This is a 55 y.o. female with history of coronary artery disease status post NSTEMI in 2019 and previous tobacco abuse for 30 years that vascular surgery has been consulted for SMA thrombus.  Patient presented to [place] ED with 2 days of generalized abdominal pain.  She had associated nausea.  She states her abdominal pain is 8.5 out of 10 in severity even after IV narcotics.  She denies any previous abdominal pain prior to 2 days ago.  Quit smoking several months ago.  Previous abdominal surgery includes C-section.  CTA at [facility] showed thrombus in the upper abdominal aorta extending into the superior mesenteric artery.  Past Medical History:  Diagnosis Date   Anemia    Fibroid    Hyperlipidemia    NSTEMI (non-ST elevated myocardial infarction) ([place]) 06/2017   s/p DES to LCX 07/02/17   Sarcoidosis     Past Surgical History:  Procedure Laterality Date   ABDOMINAL HYSTERECTOMY     BREAST BIOPSY Right 03/24/2018   BREAST REDUCTION SURGERY Bilateral 10/17/2020   Procedure: MAMMARY REDUCTION  (BREAST);  Surgeon: Cindra Presume, MD;  Location: [facility];  Service: Plastics;  Laterality: Bilateral;  2 hours   CESAREAN SECTION  1987   CORONARY/GRAFT ACUTE MI REVASCULARIZATION N/A 06/30/2017   Procedure: Coronary/Graft Acute MI Revascularization;  Surgeon: Lorretta Harp, MD;  Location: [place] [facility];  Service: Cardiovascular;  Laterality: N/A;   FOREARM FRACTURE SURGERY Right 1977   FRACTURE SURGERY     LEFT HEART CATH AND CORONARY ANGIOGRAPHY N/A 06/30/2017   Procedure: LEFT HEART CATH AND CORONARY ANGIOGRAPHY;  Surgeon: Lorretta Harp, MD;  Location: [facility];  Service: Cardiovascular;  Laterality: N/A;   LEFT HEART CATH AND CORONARY ANGIOGRAPHY N/A 12/23/2017   Procedure: LEFT HEART CATH AND CORONARY ANGIOGRAPHY;  Surgeon: Lorretta Harp,  MD;  Location: [facility];  Service: Cardiovascular;  Laterality: N/A;   TUBAL LIGATION  1990    Allergies  Allergen Reactions   Penicillins Itching    Caused rash and blisters   Hydrocodone Hives and Itching    Prior to Admission medications   Medication Sig Start Date End Date Taking? Authorizing Provider  albuterol (VENTOLIN HFA) 108 (90 Base) MCG/ACT inhaler Inhale 1-2 puffs into the lungs every 6 (six) hours as needed for wheezing or shortness of breath. 04/22/22   Dorothyann Peng, PA-C  APPLE CIDER VINEGAR PO Take 2 each by mouth daily. [place]    [provider]  aspirin EC 81 MG tablet Take 1 tablet (81 mg total) by mouth daily. Swallow whole. 12/04/19   Lorretta Harp, MD  atorvastatin (LIPITOR) 20 MG tablet TAKE 1 TABLET BY MOUTH EVERY DAY 02/23/22   Lorretta Harp, MD  benzonatate (TESSALON) 100 MG capsule Take 1 capsule (100 mg total) by mouth every 8 (eight) hours. 04/22/22   Dorothyann Peng, PA-C  Biotin 1000 MCG CHEW Chew 2,000 mcg by mouth daily. Patient not taking: Reported on 11/09/2021    [provider]  cholecalciferol (VITAMIN D3) 25 MCG (1000 UNIT) tablet Take 2,000 Units by mouth daily. Gummies    [provider]  gabapentin (NEURONTIN) 300 MG capsule Take 1 capsule (300 mg total) by mouth at bedtime. 04/12/22 07/11/22  McDonald, Stephan Minister, DPM  ibuprofen (ADVIL) 800 MG tablet Take 1 tablet (800 mg  total) by mouth every 8 (eight) hours as needed. Patient not taking: Reported on 11/09/2021 02/14/21   Hayden Rasmussen, MD  methocarbamol (ROBAXIN) 500 MG tablet Take 1 tablet (500 mg total) by mouth 2 (two) times daily as needed for muscle spasms. Patient not taking: Reported on 11/09/2021 02/14/21   Hayden Rasmussen, MD  oxyCODONE-acetaminophen (PERCOCET/ROXICET) 5-325 MG tablet Take 1 tablet by mouth every 6 (six) hours as needed for severe pain. Patient not taking: Reported on 11/09/2021 02/14/21   Hayden Rasmussen, MD    Social History    Socioeconomic History   Marital status: Widowed    Spouse name: Not on file   Number of children: 2   Years of education: Not on file   Highest education level: Not on file  Occupational History   Occupation: Chiropodist: Doctor, general practice  Tobacco Use   Smoking status: Some Days    Packs/day: 1.00    Years: 29.00    Total pack years: 29.00    Types: Cigarettes    Last attempt to quit: 03/19/2018    Years since quitting: 4.1   Smokeless tobacco: Never   Tobacco comments:    11/09/2021 Patient smokes some days  started back in 2023  Vaping Use   Vaping Use: Never used  Substance and Sexual Activity   Alcohol use: Yes    Comment: 12/20/2017 "couple drinks once/yr"   Drug use: Never   Sexual activity: Not on file  Other Topics Concern   Not on file  Social History Narrative   Not on file   Social Determinants of Health   Financial Resource Strain: Not on file  Food Insecurity: Not on file  Transportation Needs: Not on file  Physical Activity: Not on file  Stress: Not on file  Social Connections: Not on file  Intimate Partner Violence: Not on file     Family History  Problem Relation Age of Onset   Diabetes Mother    Stroke Mother 9   Hypertension Mother    Hyperlipidemia Father    Stroke Father 73   Hypertension Father    Breast cancer Paternal Grandmother     ROS: '[x]'$  Positive   '[ ]'$  Negative   '[ ]'$  All sytems reviewed and are negative  Cardiovascular: '[]'$  chest pain/pressure '[]'$  palpitations '[]'$  SOB lying flat '[]'$  DOE '[]'$  pain in legs while walking '[]'$  pain in legs at rest '[]'$  pain in legs at night '[]'$  non-healing ulcers '[]'$  hx of DVT '[]'$  swelling in legs  Pulmonary: '[]'$  productive cough '[]'$  asthma/wheezing '[]'$  home O2  Neurologic: '[]'$  weakness in '[]'$  arms '[]'$  legs '[]'$  numbness in '[]'$  arms '[]'$  legs '[]'$  hx of CVA '[]'$  mini stroke '[]'$ difficulty speaking or slurred speech '[]'$  temporary loss of vision in one eye '[]'$   dizziness  Hematologic: '[]'$  hx of cancer '[]'$  bleeding problems '[]'$  problems with blood clotting easily  Endocrine:   '[]'$  diabetes '[]'$  thyroid disease  GI '[]'$  vomiting blood '[]'$  blood in stool '[X]'$  abdominal pain GU: '[]'$  CKD/renal failure '[]'$  HD--'[]'$  M/W/F or '[]'$  T/T/S '[]'$  burning with urination '[]'$  blood in urine  Psychiatric: '[]'$  anxiety '[]'$  depression  Musculoskeletal: '[]'$  arthritis '[]'$  joint pain  Integumentary: '[]'$  rashes '[]'$  ulcers  Constitutional: '[]'$  fever '[]'$  chills   Physical Examination  Vitals:   05/03/22 0225 05/03/22 0230  BP:  (!) 154/94  Pulse:  90  Resp:  19  Temp: 97.6 F (36.4 C)  SpO2:  100%   Body mass index is 34.33 kg/m.  General:  Appears uncomfortable Gait: Not observed HENT: WNL, normocephalic Pulmonary: normal non-labored breathing Cardiac: regular, without  Murmurs, rubs or gallops Abdomen: Diffuse tenderness.  No rebound or guarding. Vascular Exam/Pulses: 2+ femoral pulses bilaterally 1+ DP pulses bilaterally Extremities: Without ischemic changes Musculoskeletal: no muscle wasting or atrophy  Neurologic: A&O X 3; Appropriate Affect ; SENSATION: normal; MOTOR FUNCTION:  moving all extremities equally. Speech is fluent/normal   CBC    Component Value Date/Time   WBC 11.0 (H) 05/02/2022 2041   RBC 5.46 (H) 05/02/2022 2041   HGB 14.7 05/02/2022 2041   HCT 43.9 05/02/2022 2041   PLT 285 05/02/2022 2041   MCV 80.4 05/02/2022 2041   MCV 89.5 07/22/2018 1550   MCH 26.9 05/02/2022 2041   MCHC 33.5 05/02/2022 2041   RDW 14.2 05/02/2022 2041   LYMPHSABS 2.8 04/22/2022 1247   MONOABS 0.6 04/22/2022 1247   EOSABS 0.2 04/22/2022 1247   BASOSABS 0.0 04/22/2022 1247    BMET    Component Value Date/Time   NA 137 05/02/2022 2041   NA 141 04/12/2022 1025   K 4.3 05/02/2022 2041   CL 103 05/02/2022 2041   CO2 24 05/02/2022 2041   GLUCOSE 138 (H) 05/02/2022 2041   BUN 9 05/02/2022 2041   BUN 8 04/12/2022 1025   CREATININE 0.88 05/02/2022  2041   CREATININE 0.80 03/13/2015 1628   CALCIUM 10.4 (H) 05/02/2022 2041   GFRNONAA >60 05/02/2022 2041   GFRAA >60 09/09/2018 1659    COAGS: No results found for: "INR", "PROTIME"   Non-Invasive Vascular Imaging:    CTA abdomen pelvis reviewed with mural aortic thrombus in the visceral abdominal aorta adjacent to the celiac and SMA.  There is a large segment of thrombus that extends into the proximal SMA that is flow limiting.  There is some flow around this thrombus and it is not completely occlusive.   ASSESSMENT/PLAN: This is a 56 y.o. female  with history of coronary artery disease status post NSTEMI in 2019 and previous tobacco abuse for 30 years that vascular surgery has been consulted for SMA thrombus.  She presented to the ED with 2 days of diffuse generalized abdominal pain that is quite severe.  On CTA she has significant mural aortic thrombus in the visceral abdominal aorta with a large tongue of thrombus that extends into the SMA that appears near occlusive.  Her white count is 11.  Her lactic acid is normal.  No signs of ischemic bowel at this time.  She appears uncomfortable.  I have recommended proceeding to the operating room for attempted percutaneous thrombectomy of the SMA thrombus and possible SMA stent.  I discussed risk and benefits of transfemoral access with mesenteric angiogram.  Also discussed risk of bowel ischemia particularly if her abdominal pain does not improve and ultimate risk of her requiring laparotomy.  Discussed risk of embolism of SMA thrombus with treatment of this lesion.  I suspect this is from her 30 years of tobacco abuse.  I have posted her for the OR this morning.  Please keep NPO.  Continue IV heparin.  Marty Heck, MD [facility] Office: [place]

## 2022-05-03 NOTE — ED Notes (Signed)
[facility] called for ED to ED transfer [facility], spoke to [place].

## 2022-05-03 NOTE — Progress Notes (Signed)
Paged Dr. Trula Slade for additional nausea med. Patient had 2nd bout of emesis. Emesis was brown in color but patient had just drank some cola.   Phenergan ordered. Will continue to monitor.

## 2022-05-03 NOTE — Op Note (Signed)
Date: May 03, 2022  Preoperative diagnosis: Acute mesenteric ischemia with SMA thrombus  Postoperative diagnosis: Same  Procedure: 1.  Ultrasound-guided access right common femoral artery 2.  Mesenteric arteriogram with catheter selection of aorta 3.  Percutaneous mechanical thrombectomy of the SMA (penumbra lightening 7) 4.  Angioplasty and stent of the proximal SMA (7 mm x 39 mm VBX)  Surgeon: Dr. Marty Heck, MD  Assistant: OR staff  Indications: 56 year old female who presented to [facility] ED last night with 2 days of abdominal pain.  CTA showed chronic mural thrombus in her visceral abdominal aorta with additional thrombus extending into the proximal SMA that was near occlusive.  She presents for percutaneous thrombectomy and likely stenting of her SMA after risks benefits discussed.  Findings: Ultrasound-guided access right common femoral artery.  Initial mesenteric arteriogram showed that the SMA appeared subtotally occluded with the celiac filling and collaterals off the celiac filling the SMA more distally.  Ultimately with a steerable sheath thee SMA subtotal occlusion was crossed with a Glidewire advantage.  Percutaneous thrombectomy was then performed with a penumbra lightening 7 for total of 3 passes and created a flow channel in the SMA.  There was significant residual stenosis of SMA with chronic disease.  This was then stented with a 7 mm x 39 mm VBX from the ostium into the proximal SMA with preservation of the main branches.  Excellent results with widely patent stent at completion.  Celiac remained patent after intervention as well.   Anesthesia: General  Details: Patient was taken to the operating room after informed consent was obtained.  Placed on operative table in supine position.  General endotracheal anesthesia was induced.  Bilateral groins were then prepped and draped in standard sterile fashion.  Antibiotics were given.  Timeout performed.  Initially  used ultrasound to evaluate the right common femoral artery, it was patent, an image was saved.  This was accessed with a micro access needle, placed a microwire, and then a microsheath.  ACT was checked and the patient was given additional 5000 units of IV heparin to maintain ACT >250.  I then advanced a pigtail catheter into the abdominal aorta to the level of L1 and a abdominal angiogram was obtained in the lateral position to evaluate the mesenteric vessels.  This showed that the SMA was subtotally occluded with filling of the distal SMA from the celiac artery.  Then placed a 7 French steerable Oscar sheath in the right common femoral artery with a Glidewire advantage and the SMA subtotal occlusion was crossed antegrade and I got my wire out into the distal branch of the SMA.  I then used the penumbra lightening 7 and percutaneous mechanical thrombectomy of the SMA was performed from the ostium all the way out to the mid vessel for a total of 3 passes.  We aspirated about 400 mL of blood half of which was saline.  There was now a flow channel in the SMA with resiudal chronic disease.  Ultimately I elected to stent this as a felt a lot of this was a chronic disease in the SMA.  I then got a planning shot through hand-injection of the [facility] sheath and then placed a 7 mm x 39 mm VBX from the ostium of the SMA landing this  into the proximal vessel and landing short of the main branches off the SMA.  This was deployed at nominal pressure.  I then passed the penumbra lightening 7 again over the wire through the  mesenteric stent into the SMA out to the main branch of the vessel and did not retrieve any additional thrombus.  We then got a final mesenteric angiogram that showed the celiac artery was patent as well as the SMA stent was widely patent.  Wires and catheters were removed and placed a short 7 French sheath in the right groin.  A mynx closure device was deployed.  Taken to recovery in stable  condition.  Complications: None  Condition: Stable  Marty Heck, MD [facility] [place] Office: [place]

## 2022-05-03 NOTE — Progress Notes (Signed)
ANTICOAGULATION CONSULT NOTE - Initial Consult  Pharmacy Consult for heparin Indication:  aortic/SMA thrombus , mesenteric ischemia  Allergies  Allergen Reactions   Penicillins Itching    Caused rash and blisters   Hydrocodone Hives and Itching    Patient Measurements: Height: '5\' 4"'$  (162.6 cm) Weight: 90.7 kg (200 lb) IBW/kg (Calculated) : 54.7 Heparin Dosing Weight: 75kg  Vital Signs: Temp: 97.8 F (36.6 C) (11/23 1934) Temp Source: Oral (11/23 1934) BP: 146/104 (11/23 1934) Pulse Rate: 105 (11/23 1934)  Labs: Recent Labs    05/02/22 2041 05/03/22 0814 05/03/22 1746 05/03/22 1835  HGB 14.7 12.6 12.7  --   HCT 43.9 37.0 39.1  --   PLT 285  --  276  --   HEPARINUNFRC  --   --   --  >1.10*  CREATININE 0.88  --   --   --      Estimated Creatinine Clearance: 77.9 mL/min (by C-G formula based on SCr of 0.88 mg/dL).   Medical History: Past Medical History:  Diagnosis Date   Anemia    Fibroid    Hyperlipidemia    NSTEMI (non-ST elevated myocardial infarction) ([place]) 06/2017   s/p DES to LCX 07/02/17   Sarcoidosis     Assessment: 56yo female c/o abdominal pain >> CT reveals thrombus within upper AA extending into SMA. Patient underwent percutaneous mechanical thrombectomy/stent of SMA on 11/23. No anticoagulation prior to admission. Pharmacy consulted to restart heparin now that patient is post-procedure.   Heparin level >1.1 is supratherapeutic on 1250 units/hr. Level was drawn 10 hours after bolus given intra-op.  Heparin is running in left arm and level was drawn from right arm.  No issues with infusion or bleeding per RN.   Goal of Therapy:  Heparin level 0.3-0.7 units/ml Monitor platelets by anticoagulation protocol: Yes   Plan:  Hold heparin for 30 minutes then restart at 1000 units/hr  Monitor daily heparin level, CBC Monitor for signs/symptoms of bleeding    Benetta Spar, PharmD, BCPS, BCCP Clinical Pharmacist  Please check AMION for all [place] phone numbers After 10:00 PM, call [place]

## 2022-05-03 NOTE — Transfer of Care (Signed)
Immediate Anesthesia Transfer of Care Note  Patient: [name]  Procedure(s) Performed: MESENTERIC ANGIOGRAM, ULTRASOUND GUIDED ACCESS OF RIGHT COMMON FEMORAL ARTERY, PERCUTANEOUS THROMBECTOMY OF SMA, SMA STENT PLACEMENT (Groin)  Patient Location: PACU  Anesthesia Type:General  Level of Consciousness: awake, alert , and oriented  Airway & Oxygen Therapy: Patient Spontanous Breathing and Patient connected to nasal cannula oxygen  Post-op Assessment: Report given to RN and Post -op Vital signs reviewed and stable  Post vital signs: Reviewed and stable  Last Vitals:  Vitals Value Taken Time  BP 120/65 05/03/22 0950  Temp    Pulse 79   Resp 18 05/03/22 0955  SpO2 99   Vitals shown include unvalidated device data.  Last Pain:  Vitals:   05/03/22 0600  TempSrc: Oral  PainSc:          Complications: No notable events documented.

## 2022-05-03 NOTE — Anesthesia Procedure Notes (Signed)
Procedure Name: Intubation Date/Time: 05/03/2022 7:45 AM  Performed by: Wilburn Cornelia, CRNAPre-anesthesia Checklist: Patient identified, Emergency Drugs available, Suction available, Patient being monitored and Timeout performed Oxygen Delivery Method: Circle system utilized Preoxygenation: Pre-oxygenation with 100% oxygen Induction Type: IV induction Ventilation: Mask ventilation without difficulty and Oral airway inserted - appropriate to patient size Laryngoscope Size: Mac and 3 Grade View: Grade I Tube type: Oral Tube size: 7.0 mm Number of attempts: 1 Airway Equipment and Method: Stylet Placement Confirmation: ETT inserted through vocal cords under direct vision, positive ETCO2, CO2 detector and breath sounds checked- equal and bilateral Secured at: 21 cm Tube secured with: Tape Dental Injury: Teeth and Oropharynx as per pre-operative assessment

## 2022-05-03 NOTE — Progress Notes (Signed)
Called to see patient for abdominal pain which she describes as the same or worse than what she initially came to the hospital for.  She recently had  a bout of emesis  AFVSS Abd is tender to the touch  Discussed with patient and family at bedside that I will ask General surgery to evaluate her for possible bowel compromise Labs ordered (CBC, BMET, lactate)   Wells Fuller Makin

## 2022-05-04 ENCOUNTER — Emergency Department (HOSPITAL_COMMUNITY): Payer: Commercial Managed Care - PPO

## 2022-05-04 ENCOUNTER — Inpatient Hospital Stay (HOSPITAL_COMMUNITY): Payer: Commercial Managed Care - PPO

## 2022-05-04 ENCOUNTER — Inpatient Hospital Stay (HOSPITAL_COMMUNITY): Payer: Commercial Managed Care - PPO | Admitting: Registered Nurse

## 2022-05-04 ENCOUNTER — Encounter (HOSPITAL_COMMUNITY)
Admission: EM | Disposition: A | Discharge status: Home Health | Payer: Self-pay | Source: Home / Self Care | Attending: Vascular Surgery

## 2022-05-04 DIAGNOSIS — J9601 Acute respiratory failure with hypoxia: Secondary | ICD-10-CM

## 2022-05-04 DIAGNOSIS — F17210 Nicotine dependence, cigarettes, uncomplicated: Secondary | ICD-10-CM

## 2022-05-04 DIAGNOSIS — K55059 Acute (reversible) ischemia of intestine, part and extent unspecified: Secondary | ICD-10-CM

## 2022-05-04 DIAGNOSIS — D649 Anemia, unspecified: Secondary | ICD-10-CM

## 2022-05-04 DIAGNOSIS — K55069 Acute infarction of intestine, part and extent unspecified: Secondary | ICD-10-CM | POA: Diagnosis not present

## 2022-05-04 DIAGNOSIS — I7410 Embolism and thrombosis of unspecified parts of aorta: Secondary | ICD-10-CM | POA: Diagnosis not present

## 2022-05-04 DIAGNOSIS — I2510 Atherosclerotic heart disease of native coronary artery without angina pectoris: Secondary | ICD-10-CM

## 2022-05-04 DIAGNOSIS — Z9889 Other specified postprocedural states: Secondary | ICD-10-CM

## 2022-05-04 DIAGNOSIS — I252 Old myocardial infarction: Secondary | ICD-10-CM

## 2022-05-04 DIAGNOSIS — K661 Hemoperitoneum: Secondary | ICD-10-CM | POA: Diagnosis not present

## 2022-05-04 DIAGNOSIS — J9602 Acute respiratory failure with hypercapnia: Secondary | ICD-10-CM

## 2022-05-04 HISTORY — PX: LAPAROSCOPY: SHX197

## 2022-05-04 HISTORY — PX: LAPAROTOMY: SHX154

## 2022-05-04 LAB — CBC
HCT: 38 % (ref 36.0–46.0)
HCT: 35.9 % — ABNORMAL LOW (ref 36.0–46.0)
Hemoglobin: 13.8 g/dL (ref 12.0–15.0)
Hemoglobin: 12.7 g/dL (ref 12.0–15.0)
MCH: 28.3 pg (ref 26.0–34.0)
MCH: 28.2 pg (ref 26.0–34.0)
MCHC: 36.3 g/dL — ABNORMAL HIGH (ref 30.0–36.0)
MCHC: 35.4 g/dL (ref 30.0–36.0)
MCV: 77.9 fL — ABNORMAL LOW (ref 80.0–100.0)
MCV: 79.6 fL — ABNORMAL LOW (ref 80.0–100.0)
Platelets: 159 10*3/uL (ref 150–400)
Platelets: 174 10*3/uL (ref 150–400)
RBC: 4.88 MIL/uL (ref 3.87–5.11)
RBC: 4.51 MIL/uL (ref 3.87–5.11)
RDW: 15.7 % — ABNORMAL HIGH (ref 11.5–15.5)
RDW: 16.2 % — ABNORMAL HIGH (ref 11.5–15.5)
WBC: 22.9 10*3/uL — ABNORMAL HIGH (ref 4.0–10.5)
WBC: 19.6 10*3/uL — ABNORMAL HIGH (ref 4.0–10.5)
nRBC: 0 % (ref 0.0–0.2)
nRBC: 0.1 % (ref 0.0–0.2)

## 2022-05-04 LAB — POCT I-STAT 7, (LYTES, BLD GAS, ICA,H+H)
Acid-base deficit: 10 mmol/L — ABNORMAL HIGH (ref 0.0–2.0)
Acid-base deficit: 5 mmol/L — ABNORMAL HIGH (ref 0.0–2.0)
Acid-base deficit: 7 mmol/L — ABNORMAL HIGH (ref 0.0–2.0)
Acid-base deficit: 5 mmol/L — ABNORMAL HIGH (ref 0.0–2.0)
Bicarbonate: 18 mmol/L — ABNORMAL LOW (ref 20.0–28.0)
Bicarbonate: 21.6 mmol/L (ref 20.0–28.0)
Bicarbonate: 24.3 mmol/L (ref 20.0–28.0)
Bicarbonate: 20.9 mmol/L (ref 20.0–28.0)
Calcium, Ion: 1 mmol/L — ABNORMAL LOW (ref 1.15–1.40)
Calcium, Ion: 0.99 mmol/L — ABNORMAL LOW (ref 1.15–1.40)
Calcium, Ion: 1.15 mmol/L (ref 1.15–1.40)
Calcium, Ion: 1.09 mmol/L — ABNORMAL LOW (ref 1.15–1.40)
HCT: 27 % — ABNORMAL LOW (ref 36.0–46.0)
HCT: 28 % — ABNORMAL LOW (ref 36.0–46.0)
HCT: 38 % (ref 36.0–46.0)
HCT: 37 % (ref 36.0–46.0)
Hemoglobin: 9.2 g/dL — ABNORMAL LOW (ref 12.0–15.0)
Hemoglobin: 9.5 g/dL — ABNORMAL LOW (ref 12.0–15.0)
Hemoglobin: 12.9 g/dL (ref 12.0–15.0)
Hemoglobin: 12.6 g/dL (ref 12.0–15.0)
O2 Saturation: 99 %
O2 Saturation: 100 %
O2 Saturation: 100 %
O2 Saturation: 96 %
Patient temperature: 36
Potassium: 6.3 mmol/L (ref 3.5–5.1)
Potassium: 6.3 mmol/L (ref 3.5–5.1)
Potassium: 4.7 mmol/L (ref 3.5–5.1)
Potassium: 4 mmol/L (ref 3.5–5.1)
Sodium: 136 mmol/L (ref 135–145)
Sodium: 138 mmol/L (ref 135–145)
Sodium: 141 mmol/L (ref 135–145)
Sodium: 139 mmol/L (ref 135–145)
TCO2: 19 mmol/L — ABNORMAL LOW (ref 22–32)
TCO2: 23 mmol/L (ref 22–32)
TCO2: 27 mmol/L (ref 22–32)
TCO2: 22 mmol/L (ref 22–32)
pCO2 arterial: 46.8 mmHg (ref 32–48)
pCO2 arterial: 47.6 mmHg (ref 32–48)
pCO2 arterial: 78.2 mmHg (ref 32–48)
pCO2 arterial: 39.7 mmHg (ref 32–48)
pH, Arterial: 7.192 — CL (ref 7.35–7.45)
pH, Arterial: 7.264 — ABNORMAL LOW (ref 7.35–7.45)
pH, Arterial: 7.094 — CL (ref 7.35–7.45)
pH, Arterial: 7.33 — ABNORMAL LOW (ref 7.35–7.45)
pO2, Arterial: 176 mmHg — ABNORMAL HIGH (ref 83–108)
pO2, Arterial: 278 mmHg — ABNORMAL HIGH (ref 83–108)
pO2, Arterial: 263 mmHg — ABNORMAL HIGH (ref 83–108)
pO2, Arterial: 89 mmHg (ref 83–108)

## 2022-05-04 LAB — BASIC METABOLIC PANEL
Anion gap: 9 (ref 5–15)
Anion gap: 9 (ref 5–15)
BUN: 13 mg/dL (ref 6–20)
BUN: 13 mg/dL (ref 6–20)
CO2: 21 mmol/L — ABNORMAL LOW (ref 22–32)
CO2: 22 mmol/L (ref 22–32)
Calcium: 8.2 mg/dL — ABNORMAL LOW (ref 8.9–10.3)
Calcium: 8.2 mg/dL — ABNORMAL LOW (ref 8.9–10.3)
Chloride: 110 mmol/L (ref 98–111)
Chloride: 109 mmol/L (ref 98–111)
Creatinine, Ser: 1.13 mg/dL — ABNORMAL HIGH (ref 0.44–1.00)
Creatinine, Ser: 1.07 mg/dL — ABNORMAL HIGH (ref 0.44–1.00)
GFR, Estimated: 57 mL/min — ABNORMAL LOW (ref >60)
GFR, Estimated: >60 mL/min (ref >60)
Glucose, Bld: 191 mg/dL — ABNORMAL HIGH (ref 70–99)
Glucose, Bld: 171 mg/dL — ABNORMAL HIGH (ref 70–99)
Potassium: 4.1 mmol/L (ref 3.5–5.1)
Potassium: 4.5 mmol/L (ref 3.5–5.1)
Sodium: 140 mmol/L (ref 135–145)
Sodium: 140 mmol/L (ref 135–145)

## 2022-05-04 LAB — LACTIC ACID, PLASMA
Lactic Acid, Venous: 5.1 mmol/L (ref 0.5–1.9)
Lactic Acid, Venous: 3.7 mmol/L (ref 0.5–1.9)

## 2022-05-04 LAB — GLUCOSE, CAPILLARY: Glucose-Capillary: 160 mg/dL — ABNORMAL HIGH (ref 70–99)

## 2022-05-04 SURGERY — LAPAROSCOPY, DIAGNOSTIC
Anesthesia: General

## 2022-05-04 MED ORDER — VASOPRESSIN 20 UNIT/ML IV SOLN
INTRAVENOUS | Status: DC | PRN
  Administered 2022-05-04 (×4): 1 [IU] via INTRAVENOUS

## 2022-05-04 MED ORDER — PHENYLEPHRINE HCL-NACL 20-0.9 MG/250ML-% IV SOLN
INTRAVENOUS | Status: DC | PRN
  Administered 2022-05-04: 50 ug/min via INTRAVENOUS

## 2022-05-04 MED ORDER — FENTANYL CITRATE (PF) 250 MCG/5ML IJ SOLN
INTRAMUSCULAR | Status: AC
  Filled 2022-05-04: qty 5

## 2022-05-04 MED ORDER — NALOXONE HCL 0.4 MG/ML IJ SOLN: 0.4000 mg | INTRAMUSCULAR | Status: DC | PRN

## 2022-05-04 MED ORDER — SODIUM CHLORIDE 0.9% FLUSH: 9.0000 mL | INTRAVENOUS | Status: DC | PRN

## 2022-05-04 MED ORDER — ROCURONIUM BROMIDE 10 MG/ML (PF) SYRINGE
PREFILLED_SYRINGE | INTRAVENOUS | Status: DC | PRN
  Administered 2022-05-04: 20 mg via INTRAVENOUS
  Administered 2022-05-04: 50 mg via INTRAVENOUS

## 2022-05-04 MED ORDER — PROPOFOL 10 MG/ML IV BOLUS
INTRAVENOUS | Status: DC | PRN
  Administered 2022-05-04: 100 mg via INTRAVENOUS

## 2022-05-04 MED ORDER — DIPHENHYDRAMINE HCL 50 MG/ML IJ SOLN: 12.5000 mg | Freq: Four times a day (QID) | INTRAMUSCULAR | Status: DC | PRN

## 2022-05-04 MED ORDER — CHLORHEXIDINE GLUCONATE CLOTH 2 % EX PADS
6.0000 | MEDICATED_PAD | Freq: Every day | CUTANEOUS | Status: DC
  Administered 2022-05-04 – 2022-05-14 (×11): 6 via TOPICAL

## 2022-05-04 MED ORDER — FENTANYL CITRATE (PF) 100 MCG/2ML IJ SOLN
25.0000 ug | INTRAMUSCULAR | Status: DC | PRN
  Administered 2022-05-04: 50 ug via INTRAVENOUS

## 2022-05-04 MED ORDER — ACETAMINOPHEN 325 MG PO TABS
650.0000 mg | ORAL_TABLET | ORAL | Status: DC | PRN
  Administered 2022-05-10 – 2022-05-15 (×2): 650 mg
  Filled 2022-05-04 (×2): qty 2

## 2022-05-04 MED ORDER — INSULIN ASPART 100 UNIT/ML IJ SOLN
1.0000 [IU] | INTRAMUSCULAR | Status: DC
  Administered 2022-05-05: 1 [IU] via SUBCUTANEOUS
  Administered 2022-05-05: 2 [IU] via SUBCUTANEOUS
  Administered 2022-05-05 – 2022-05-12 (×8): 1 [IU] via SUBCUTANEOUS

## 2022-05-04 MED ORDER — VASOPRESSIN 20 UNIT/ML IV SOLN
INTRAVENOUS | Status: AC
  Filled 2022-05-04: qty 1

## 2022-05-04 MED ORDER — SODIUM CHLORIDE 0.9 % IR SOLN
Status: DC | PRN
  Administered 2022-05-04: 1000 mL

## 2022-05-04 MED ORDER — ASPIRIN 81 MG PO CHEW
81.0000 mg | CHEWABLE_TABLET | Freq: Every day | ORAL | Status: DC
  Administered 2022-05-04 – 2022-05-11 (×8): 81 mg
  Filled 2022-05-04 (×7): qty 1

## 2022-05-04 MED ORDER — SODIUM CHLORIDE 0.9 % IV BOLUS
1000.0000 mL | Freq: Once | INTRAVENOUS | Status: AC
  Administered 2022-05-04: 1000 mL via INTRAVENOUS

## 2022-05-04 MED ORDER — SODIUM BICARBONATE 8.4 % IV SOLN
INTRAVENOUS | Status: DC | PRN
  Administered 2022-05-04: 50 meq via INTRAVENOUS

## 2022-05-04 MED ORDER — OXYCODONE HCL 5 MG PO TABS: 5.0000 mg | ORAL_TABLET | Freq: Once | ORAL | Status: DC | PRN

## 2022-05-04 MED ORDER — EPHEDRINE SULFATE-NACL 50-0.9 MG/10ML-% IV SOSY
PREFILLED_SYRINGE | INTRAVENOUS | Status: DC | PRN
  Administered 2022-05-04: 10 mg via INTRAVENOUS

## 2022-05-04 MED ORDER — DIPHENHYDRAMINE HCL 12.5 MG/5ML PO ELIX: 12.5000 mg | ORAL_SOLUTION | Freq: Four times a day (QID) | ORAL | Status: DC | PRN

## 2022-05-04 MED ORDER — FENTANYL CITRATE (PF) 250 MCG/5ML IJ SOLN
INTRAMUSCULAR | Status: DC | PRN
  Administered 2022-05-04: 25 ug via INTRAVENOUS
  Administered 2022-05-04: 100 ug via INTRAVENOUS

## 2022-05-04 MED ORDER — ONDANSETRON HCL 4 MG/2ML IJ SOLN
INTRAMUSCULAR | Status: DC | PRN
  Administered 2022-05-04: 4 mg via INTRAVENOUS

## 2022-05-04 MED ORDER — OXYCODONE HCL 5 MG/5ML PO SOLN: 5.0000 mg | Freq: Once | ORAL | Status: DC | PRN

## 2022-05-04 MED ORDER — ESMOLOL HCL 100 MG/10ML IV SOLN
INTRAVENOUS | Status: DC | PRN
  Administered 2022-05-04: 30 mg via INTRAVENOUS
  Administered 2022-05-04: 40 mg via INTRAVENOUS

## 2022-05-04 MED ORDER — OXYCODONE HCL 5 MG PO TABS
5.0000 mg | ORAL_TABLET | ORAL | Status: DC | PRN
  Administered 2022-05-08: 10 mg
  Filled 2022-05-04: qty 2

## 2022-05-04 MED ORDER — SODIUM CHLORIDE 0.9 % IV SOLN: INTRAVENOUS | Status: DC | PRN

## 2022-05-04 MED ORDER — PIPERACILLIN-TAZOBACTAM 3.375 G IVPB
3.3750 g | Freq: Three times a day (TID) | INTRAVENOUS | Status: DC
  Administered 2022-05-04 – 2022-05-06 (×5): 3.375 g via INTRAVENOUS
  Filled 2022-05-04 (×5): qty 50

## 2022-05-04 MED ORDER — ESMOLOL HCL 100 MG/10ML IV SOLN
INTRAVENOUS | Status: AC
  Filled 2022-05-04: qty 10

## 2022-05-04 MED ORDER — NALOXONE HCL 0.4 MG/ML IJ SOLN
INTRAMUSCULAR | Status: AC
  Filled 2022-05-04: qty 1

## 2022-05-04 MED ORDER — PANTOPRAZOLE SODIUM 40 MG IV SOLR
40.0000 mg | Freq: Two times a day (BID) | INTRAVENOUS | Status: DC
  Administered 2022-05-04 – 2022-05-11 (×16): 40 mg via INTRAVENOUS
  Filled 2022-05-04 (×17): qty 10

## 2022-05-04 MED ORDER — CIPROFLOXACIN IN D5W 400 MG/200ML IV SOLN
400.0000 mg | INTRAVENOUS | Status: AC
  Administered 2022-05-04: 400 mg via INTRAVENOUS
  Filled 2022-05-04: qty 200

## 2022-05-04 MED ORDER — LACTATED RINGERS IV SOLN: INTRAVENOUS | Status: DC | PRN

## 2022-05-04 MED ORDER — FENTANYL 50 MCG/ML IV PCA SOLN: INTRAVENOUS | Status: DC

## 2022-05-04 MED ORDER — ONDANSETRON HCL 4 MG/2ML IJ SOLN: 4.0000 mg | Freq: Four times a day (QID) | INTRAMUSCULAR | Status: DC | PRN

## 2022-05-04 MED ORDER — CLOPIDOGREL BISULFATE 75 MG PO TABS
75.0000 mg | ORAL_TABLET | Freq: Every day | ORAL | Status: DC
  Administered 2022-05-04 – 2022-05-11 (×7): 75 mg
  Filled 2022-05-04 (×6): qty 1

## 2022-05-04 MED ORDER — PHENYLEPHRINE 80 MCG/ML (10ML) SYRINGE FOR IV PUSH (FOR BLOOD PRESSURE SUPPORT)
PREFILLED_SYRINGE | INTRAVENOUS | Status: DC | PRN
  Administered 2022-05-04 (×3): 160 ug via INTRAVENOUS

## 2022-05-04 MED ORDER — FENTANYL CITRATE (PF) 100 MCG/2ML IJ SOLN
INTRAMUSCULAR | Status: AC
  Filled 2022-05-04: qty 2

## 2022-05-04 MED ORDER — CLEVIDIPINE BUTYRATE 0.5 MG/ML IV EMUL
0.0000 mg/h | INTRAVENOUS | Status: DC
  Administered 2022-05-04: 18 mg/h via INTRAVENOUS
  Administered 2022-05-04: 20 mg/h via INTRAVENOUS
  Administered 2022-05-04: 9 mg/h via INTRAVENOUS
  Administered 2022-05-04: 14 mg/h via INTRAVENOUS
  Administered 2022-05-04 – 2022-05-05 (×7): 20 mg/h via INTRAVENOUS
  Filled 2022-05-04 (×10): qty 100

## 2022-05-04 MED ORDER — SUGAMMADEX SODIUM 200 MG/2ML IV SOLN
INTRAVENOUS | Status: DC | PRN
  Administered 2022-05-04: 200 mg via INTRAVENOUS

## 2022-05-04 MED ORDER — MIDAZOLAM HCL 2 MG/2ML IJ SOLN
INTRAMUSCULAR | Status: AC
  Filled 2022-05-04: qty 2

## 2022-05-04 MED ORDER — ATORVASTATIN CALCIUM 10 MG PO TABS
20.0000 mg | ORAL_TABLET | Freq: Every day | ORAL | Status: DC
  Administered 2022-05-04 – 2022-05-11 (×8): 20 mg
  Filled 2022-05-04 (×9): qty 2

## 2022-05-04 MED ORDER — SODIUM CHLORIDE 0.9 % IV SOLN: INTRAVENOUS | Status: DC

## 2022-05-04 MED ORDER — SUCCINYLCHOLINE CHLORIDE 200 MG/10ML IV SOSY
PREFILLED_SYRINGE | INTRAVENOUS | Status: DC | PRN
  Administered 2022-05-04: 120 mg via INTRAVENOUS

## 2022-05-04 MED ORDER — BUPIVACAINE HCL (PF) 0.25 % IJ SOLN
INTRAMUSCULAR | Status: AC
  Filled 2022-05-04: qty 30

## 2022-05-04 MED ORDER — ALUM & MAG HYDROXIDE-SIMETH 200-200-20 MG/5ML PO SUSP
30.0000 mL | ORAL | Status: DC | PRN
  Administered 2022-05-14: 30 mL
  Filled 2022-05-04: qty 30

## 2022-05-04 MED ORDER — ATORVASTATIN CALCIUM 10 MG PO TABS: 20.0000 mg | ORAL_TABLET | Freq: Every day | ORAL | Status: DC

## 2022-05-04 MED ORDER — BUPIVACAINE HCL 0.25 % IJ SOLN
INTRAMUSCULAR | Status: DC | PRN
  Administered 2022-05-04: 7 mL

## 2022-05-04 MED ORDER — LIDOCAINE 2% (20 MG/ML) 5 ML SYRINGE
INTRAMUSCULAR | Status: DC | PRN
  Administered 2022-05-04: 60 mg via INTRAVENOUS

## 2022-05-04 MED ORDER — PROPOFOL 10 MG/ML IV BOLUS
INTRAVENOUS | Status: AC
  Filled 2022-05-04: qty 20

## 2022-05-04 MED ORDER — ALBUMIN HUMAN 5 % IV SOLN: INTRAVENOUS | Status: DC | PRN

## 2022-05-04 MED ORDER — METRONIDAZOLE 500 MG/100ML IV SOLN
500.0000 mg | INTRAVENOUS | Status: AC
  Administered 2022-05-04: 500 mg via INTRAVENOUS
  Filled 2022-05-04: qty 100

## 2022-05-04 MED ORDER — CLEVIDIPINE BUTYRATE 0.5 MG/ML IV EMUL
2.0000 mg/h | INTRAVENOUS | Status: DC
  Administered 2022-05-04: 4 mg/h via INTRAVENOUS

## 2022-05-04 MED ORDER — FENTANYL 50 MCG/ML IV PCA SOLN
INTRAVENOUS | Status: DC
  Administered 2022-05-04: 110 ug via INTRAVENOUS
  Administered 2022-05-05: 20 ug via INTRAVENOUS
  Administered 2022-05-05: 50 ug via INTRAVENOUS
  Administered 2022-05-05: 30 ug via INTRAVENOUS
  Administered 2022-05-05: 40 ug via INTRAVENOUS
  Administered 2022-05-05: 50 ug via INTRAVENOUS
  Administered 2022-05-05: 40 ug via INTRAVENOUS
  Administered 2022-05-05: 80 ug via INTRAVENOUS
  Administered 2022-05-06: 20 ug via INTRAVENOUS
  Administered 2022-05-06: 90 ug via INTRAVENOUS
  Administered 2022-05-06: 40 ug via INTRAVENOUS
  Administered 2022-05-06: 30 ug via INTRAVENOUS
  Administered 2022-05-07 (×2): 10 ug via INTRAVENOUS
  Administered 2022-05-07: 20 ug via INTRAVENOUS
  Administered 2022-05-07 – 2022-05-08 (×2): 30 ug via INTRAVENOUS
  Administered 2022-05-08 (×2): 50 ug via INTRAVENOUS
  Administered 2022-05-09: 10 ug via INTRAVENOUS
  Filled 2022-05-04: qty 25

## 2022-05-04 MED ORDER — DEXAMETHASONE SODIUM PHOSPHATE 10 MG/ML IJ SOLN
INTRAMUSCULAR | Status: DC | PRN
  Administered 2022-05-04: 5 mg via INTRAVENOUS

## 2022-05-04 MED ORDER — 0.9 % SODIUM CHLORIDE (POUR BTL) OPTIME
TOPICAL | Status: DC | PRN
  Administered 2022-05-04 (×2): 1000 mL

## 2022-05-04 SURGICAL SUPPLY — 61 items
ADH SKN CLS APL DERMABOND .7 (GAUZE/BANDAGES/DRESSINGS) ×1
APL PRP STRL LF DISP 70% ISPRP (MISCELLANEOUS) ×2
BAG COUNTER SPONGE SURGICOUNT (BAG) ×1 IMPLANT
BAG SPNG CNTER NS LX DISP (BAG) ×1
BLADE CLIPPER SURG (BLADE) IMPLANT
CANISTER SUCT 3000ML PPV (MISCELLANEOUS) ×1 IMPLANT
CHLORAPREP W/TINT 26 (MISCELLANEOUS) ×1 IMPLANT
COVER SURGICAL LIGHT HANDLE (MISCELLANEOUS) ×1 IMPLANT
DERMABOND ADVANCED .7 DNX12 (GAUZE/BANDAGES/DRESSINGS) ×1 IMPLANT
DRAPE LAPAROSCOPIC ABDOMINAL (DRAPES) ×1 IMPLANT
DRAPE WARM FLUID 44X44 (DRAPES) ×1 IMPLANT
DRSG OPSITE POSTOP 4X10 (GAUZE/BANDAGES/DRESSINGS) IMPLANT
DRSG OPSITE POSTOP 4X8 (GAUZE/BANDAGES/DRESSINGS) IMPLANT
ELECT BLADE 6.5 EXT (BLADE) IMPLANT
ELECT CAUTERY BLADE 6.4 (BLADE) ×1 IMPLANT
ELECT REM PT RETURN 9FT ADLT (ELECTROSURGICAL) ×1
ELECTRODE REM PT RTRN 9FT ADLT (ELECTROSURGICAL) ×1 IMPLANT
GLOVE BIO SURGEON STRL SZ7.5 (GLOVE) ×1 IMPLANT
GLOVE BIOGEL PI IND STRL 8 (GLOVE) ×1 IMPLANT
GLOVE SURG SYN 7.5  E (GLOVE) ×1
GLOVE SURG SYN 7.5 E (GLOVE) ×1 IMPLANT
GLOVE SURG SYN 7.5 PF PI (GLOVE) ×1 IMPLANT
GOWN STRL REUS W/ TWL LRG LVL3 (GOWN DISPOSABLE) ×2 IMPLANT
GOWN STRL REUS W/ TWL XL LVL3 (GOWN DISPOSABLE) ×1 IMPLANT
GOWN STRL REUS W/TWL LRG LVL3 (GOWN DISPOSABLE) ×2
GOWN STRL REUS W/TWL XL LVL3 (GOWN DISPOSABLE) ×1
HANDLE SUCTION POOLE (INSTRUMENTS) ×1 IMPLANT
KIT BASIN OR (CUSTOM PROCEDURE TRAY) ×1 IMPLANT
KIT TURNOVER KIT B (KITS) ×1 IMPLANT
LIGASURE IMPACT 36 18CM CVD LR (INSTRUMENTS) IMPLANT
NDL INSUFFLATION 14GA 120MM (NEEDLE) ×1 IMPLANT
NEEDLE INSUFFLATION 14GA 120MM (NEEDLE) ×1 IMPLANT
NS IRRIG 1000ML POUR BTL (IV SOLUTION) ×2 IMPLANT
PACK GENERAL/GYN (CUSTOM PROCEDURE TRAY) ×1 IMPLANT
PAD ARMBOARD 7.5X6 YLW CONV (MISCELLANEOUS) ×2 IMPLANT
PENCIL SMOKE EVACUATOR (MISCELLANEOUS) ×1 IMPLANT
SCISSORS LAP 5X35 DISP (ENDOMECHANICALS) IMPLANT
SET IRRIG TUBING LAPAROSCOPIC (IRRIGATION / IRRIGATOR) IMPLANT
SET TUBE SMOKE EVAC HIGH FLOW (TUBING) ×1 IMPLANT
SLEEVE ENDOPATH XCEL 5M (ENDOMECHANICALS) ×1 IMPLANT
SPECIMEN JAR LARGE (MISCELLANEOUS) IMPLANT
SPONGE T-LAP 18X18 ~~LOC~~+RFID (SPONGE) IMPLANT
STAPLER VISISTAT 35W (STAPLE) ×1 IMPLANT
SUCTION POOLE HANDLE (INSTRUMENTS) ×2
SUT MNCRL AB 4-0 PS2 18 (SUTURE) ×1 IMPLANT
SUT PDS AB 1 TP1 54 (SUTURE) ×2 IMPLANT
SUT SILK 2 0 SH CR/8 (SUTURE) ×1 IMPLANT
SUT SILK 2 0 TIES 10X30 (SUTURE) ×1 IMPLANT
SUT SILK 3 0 SH CR/8 (SUTURE) ×1 IMPLANT
SUT SILK 3 0 TIES 10X30 (SUTURE) ×1 IMPLANT
TOWEL GREEN STERILE (TOWEL DISPOSABLE) ×1 IMPLANT
TOWEL GREEN STERILE FF (TOWEL DISPOSABLE) ×1 IMPLANT
TRAY FOLEY MTR SLVR 16FR STAT (SET/KITS/TRAYS/PACK) ×1 IMPLANT
TRAY LAPAROSCOPIC MC (CUSTOM PROCEDURE TRAY) ×1 IMPLANT
TROCAR XCEL 12X100 BLDLESS (ENDOMECHANICALS) IMPLANT
TROCAR XCEL BLUNT TIP 100MML (ENDOMECHANICALS) IMPLANT
TROCAR XCEL NON-BLD 11X100MML (ENDOMECHANICALS) IMPLANT
TROCAR Z-THREAD OPTICAL 5X100M (TROCAR) ×1 IMPLANT
TUBE CONNECTING 20X1/4 (TUBING) IMPLANT
WARMER LAPAROSCOPE (MISCELLANEOUS) ×1 IMPLANT
YANKAUER SUCT BULB TIP NO VENT (SUCTIONS) IMPLANT

## 2022-05-04 NOTE — Significant Event (Addendum)
Rapid Response Event Note   Reason for Call :  Called originally at 2136 d/t increased abd pain/emesis s/p  thrombectomy and stent placement to SMA.  At that time, Dr. Lissa Morales) and Dr. Ramirez(surgery) had already been to bedside to assess pt. Plan was to repeat labs and monitor.   RRT called back at 0157 d/t hypotension(74/63) and tachycardia(130s). Pt seen at 0201 Initial Focused Assessment:  Pt lying in bed on her R side with eyes closed. She is alert and oriented, still c/o 10/10 pain in abd. Lungs clear/diminished. ABD distended but soft, very tender to touch, hypoactive bowel sounds. Skin cool to touch.   T-98.6, HR-131, BP-74/63, RR-24, SpO2-99% on RA.   Interventions:  1L [place] bolus Heparin held Lactic acid pending Plan of Care:  Give bolus. Await LA results and relay to MD. Heparin held for possible surgery.  Continue to monitor pt closely. Call RRT if further assistance needed.   Event Summary:   MD Notified: Dr. Rosendo Gros notified and came to bedside Call [place]   Update: 0330-LA-5.1, HR-120, SBP-80. Plan: OR.   Dillard Essex, RN

## 2022-05-04 NOTE — Anesthesia Procedure Notes (Signed)
Procedure Name: Intubation Date/Time: 05/04/2022 4:39 AM  Performed by: Trinna Post., CRNAPre-anesthesia Checklist: Patient identified, Emergency Drugs available, Suction available, Patient being monitored and Timeout performed Patient Re-evaluated:Patient Re-evaluated prior to induction Oxygen Delivery Method: Circle system utilized Preoxygenation: Pre-oxygenation with 100% oxygen Induction Type: IV induction, Rapid sequence and Cricoid Pressure applied Laryngoscope Size: Mac and 3 Grade View: Grade I Tube type: Oral Tube size: 7.0 mm Number of attempts: 1 Airway Equipment and Method: Stylet Placement Confirmation: ETT inserted through vocal cords under direct vision, positive ETCO2 and breath sounds checked- equal and bilateral Secured at: 22 cm Tube secured with: Tape Dental Injury: Teeth and Oropharynx as per pre-operative assessment

## 2022-05-04 NOTE — Progress Notes (Signed)
Transported from PACU to 2X52 without complications

## 2022-05-04 NOTE — Progress Notes (Signed)
Patient ID: [name], female   DOB: Mar 08, 1966, 56 y.o.   MRN: [id number] Patient with con't abd pain that has been unchanged. Abd remains soft, but pt c/o severe pain.  Lactate trending up.  S/w Daughter and pt in room. Will plan dx lap and possible ex lap, possible bowel resection.  Pt may require further surgery to delineate any visceral ischemia.  Pt voiced understanding and wishes to proceed.

## 2022-05-04 NOTE — Progress Notes (Signed)
Pt taken off bipap and placed on [place] at this time. VS WNL. Pt denies SOB, no increased WOB. RT will continue to monitor

## 2022-05-04 NOTE — Transfer of Care (Signed)
Immediate Anesthesia Transfer of Care Note  Patient: [name]  Procedure(s) Performed: LAPAROSCOPY DIAGNOSTIC EXPLORATORY LAPAROTOMY  Patient Location: PACU  Anesthesia Type:General  Level of Consciousness: drowsy and responds to stimulation  Airway & Oxygen Therapy: Patient Spontanous Breathing  Post-op Assessment: Report given to RN and Post -op Vital signs reviewed and stable  Post vital signs: Reviewed and stable  Last Vitals:  Vitals Value Taken Time  BP 125/48 05/04/22 0722  Temp    Pulse 108 05/04/22 0724  Resp 18 05/04/22 0724  SpO2 100 % 05/04/22 0724  Vitals shown include unvalidated device data.  Last Pain:  Vitals:   05/04/22 0247  TempSrc: Rectal  PainSc:          Complications: No notable events documented.

## 2022-05-04 NOTE — Anesthesia Preprocedure Evaluation (Addendum)
Anesthesia Evaluation  Patient identified by MRN, date of birth, ID band Patient awake    Reviewed: Allergy & Precautions, H&P , NPO status , Patient's Chart, lab work & pertinent test results  Airway Mallampati: II   Neck ROM: full    Dental   Pulmonary Current Smoker   breath sounds clear to auscultation       Cardiovascular + CAD, + Past MI, + Cardiac Stents and + Peripheral Vascular Disease   Rhythm:regular Rate:Normal  Stent placed in SMA earlier today. Pt is on heparin gtt   Neuro/Psych    GI/Hepatic   Endo/Other    Renal/GU      Musculoskeletal   Abdominal   Peds  Hematology  (+) Blood dyscrasia, anemia   Anesthesia Other Findings   Reproductive/Obstetrics                             Anesthesia Physical Anesthesia Plan  ASA: 4 and emergent  Anesthesia Plan: General   Post-op Pain Management:    Induction: Intravenous  PONV Risk Score and Plan: 2 and Ondansetron, Dexamethasone, Midazolam and Treatment may vary due to age or medical condition  Airway Management Planned: Oral ETT  Additional Equipment: Arterial line  Intra-op Plan:   Post-operative Plan: Extubation in OR and Possible Post-op intubation/ventilation  Informed Consent: I have reviewed the patients History and Physical, chart, labs and discussed the procedure including the risks, benefits and alternatives for the proposed anesthesia with the patient or authorized representative who has indicated his/her understanding and acceptance.     Dental advisory given  Plan Discussed with: CRNA, Anesthesiologist and Surgeon  Anesthesia Plan Comments:        Anesthesia Quick Evaluation

## 2022-05-04 NOTE — Anesthesia Procedure Notes (Signed)
Arterial Line Insertion Start/End11/24/2023 5:10 AM, 05/04/2022 5:25 AM Performed by: Albertha Ghee, MD, anesthesiologist  Patient location: OR. Preanesthetic checklist: patient identified, IV checked, site marked, risks and benefits discussed, surgical consent, monitors and equipment checked, pre-op evaluation, timeout performed and anesthesia consent Patient sedated Left, femoral was placed Catheter size: 18 G Hand hygiene performed , maximum sterile barriers used  and Seldinger technique used  Attempts: 1 Procedure performed using ultrasound guided technique. Ultrasound Notes:anatomy identified, needle tip was noted to be adjacent to the nerve/plexus identified, no ultrasound evidence of intravascular and/or intraneural injection and image(s) printed for medical record Following insertion, line sutured, dressing applied and Biopatch. Post procedure assessment: normal  Patient tolerated the procedure well with no immediate complications.

## 2022-05-04 NOTE — Op Note (Signed)
05/04/2022  6:38 AM  PATIENT:  [name]  56 y.o. female  PRE-OPERATIVE DIAGNOSIS:  Abdominal Pain  POST-OPERATIVE DIAGNOSIS: Hemoperitoneum, no source of bleeding found  PROCEDURE:  Procedure(s): LAPAROSCOPY DIAGNOSTIC (N/A) EXPLORATORY LAPAROTOMY (N/A)  SURGEON:  Surgeon(s) and Role:    Ralene Ok, MD - Primary   ANESTHESIA:   local and general  EBL:  none, approximately 1 L of old blood within the abdominal cavity.  BLOOD ADMINISTERED: 3 unitsPRBC  DRAINS: none   LOCAL MEDICATIONS USED:  BUPIVICAINE   SPECIMEN:  No Specimen  DISPOSITION OF SPECIMEN:  N/A  COUNTS:  YES  TOURNIQUET:  * No tourniquets in log *  DICTATION: .Dragon Dictation Indicated procedure: Patient is a 56 year old female who came in secondary to abdominal pain.  Patient was found to have SMA thrombus.  Patient underwent thrombectomy.  Patient subsequently had continued abdominal pain.  Patient was taken back to the operating room for diagnostic laparoscopy and laparotomy.  Findings: Patient had approximately 1 L of blood within the abdominal cavity.  This appeared to be the cause of her peritonitis.  Upon exploration there is no active bleeding that could be found.  Patient did have a hematoma at the base of her mesentery.  This was nonexpanding.  There is no hematoma to the right retroperitoneal area.  Details of procedure: After the patient was consented she was taken back to the OR and placed in supine position with bilateral SCDs placed.  She underwent general trach  anesthesia.  She was then prepped and draped in standard fashion.  A timeout was called all facts verified.  A Veress needle technique was used to insufflate the abdomen to 15 mmHg.  This was in the left subcostal margin.  Subsequent to this 5 Miller trocar and camera placed intra-abdominal.  There was no injury to any intra-abdominal organs.  There is large amount of blood that could be seen.  A working 5 Miller  trocars placed left lower quadrant in the epigastrium under direct visualization.  At this time patient with position.  The bowel was visualized.  This was pink and healthy.  There was a significant mount of old blood that was within the abdominal cavity.  I did suction this out with the laparoscopic suction device.  There is approximately 600 to 700 cc that was suctioned out.  At this time secondary to the inability to localize the bleeding I decided to perform a laparotomy.  A #10 blade was used to make a midline incision.  Dissection was taken down with cautery to the linea alba.  This was incised.  The peritoneum was entered bluntly.  At this time the fascia was examined to the length of the skin incision.  Abdominal wall retractor was then placed.  Laparotomy pads were used to remove the blood in all 4 quadrants.  The left upper quadrant left lower quadrant retroperitoneum were seen to be free of any active hematoma or bleeding.  Upon visualizing the patient mesentery there appeared to be hematoma at the base of the mesentery.  There is no active extravasation or expanding hematoma that could be seen.  The left right upper quadrant and right lower quadrant were visualized.  There is no retroperitoneal hematoma to the right retroperitoneal.  At this time the pulse of the distal SMA and the base of the mesentery was palpated.  This was easily palpable.  The abdominal cavity was then irrigated out with sterile saline.  As there is no  definitive site of bleeding.  I decided to continue with close the abdominal cavity.  #1 PDS single-stranded x2 was used to reapproximate the midline fascia.  The skin was then closed with skin staples.  A honeycomb dressing was then placed.  Patient tolerated the procedure well was taken to the recovery room in stable condition.   PLAN OF CARE: Admit to inpatient   PATIENT DISPOSITION:  ICU - extubated and stable.   Delay start of Pharmacological VTE agent (>24hrs) due  to surgical blood loss or risk of bleeding: no

## 2022-05-04 NOTE — Progress Notes (Signed)
Pharmacy Antibiotic Note  [name] is a 56 y.o. female admitted on 05/02/2022 with thrombus in the SMA s/p thrombectomy now with intra-abdominal infection, possible peritonitis.  Pharmacy has been consulted for piperacillin/tazobactam dosing.  ClCr 64 ml/min. WBC 19.6, lactate 3.7 down.   Plan: Piperacillin/tazobactam 3.375 g q8 hr (EI) Monitor cultures, clinical status, renal function Narrow abx as able and f/u duration    Height: '5\' 4"'$  (162.6 cm) Weight: 90.7 kg (200 lb) IBW/kg (Calculated) : 54.7  Temp (24hrs), Avg:97.8 F (36.6 C), Min:97 F (36.1 C), Max:98.6 F (37 C)  Recent Labs  Lab 05/02/22 2041 05/03/22 0034 05/03/22 1746 05/03/22 1957 05/04/22 0245 05/04/22 1125 05/04/22 1130 05/04/22 1702  WBC 11.0*  --  11.5* 9.9  --  22.9*  --  19.6*  CREATININE 0.88  --   --  0.97  --  1.13*  --  1.07*  LATICACIDVEN  --  0.5  --  1.7 5.1*  --  3.7*  --     Estimated Creatinine Clearance: 64 mL/min (A) (by C-G formula based on SCr of 1.07 mg/dL (H)).    Allergies  Allergen Reactions   Penicillins Itching    Caused rash and blisters   Hydrocodone Hives and Itching    Antimicrobials this admission: Cipro x1 11/24 MTZ x1 11/24  Piptazo 11/24 >>    Microbiology results: none  Thank you for allowing pharmacy to be a part of this patient's care.  Benetta Spar, PharmD, BCPS, BCCP Clinical Pharmacist  Please check AMION for all [place] phone numbers After 10:00 PM, call [place] [phone]

## 2022-05-04 NOTE — Progress Notes (Signed)
Per Dr. Carlis Abbott, Heparin will be on hold for 24 hours. Keep patient NPO.

## 2022-05-04 NOTE — Progress Notes (Signed)
Patient had a third bout of emesis. Mucous with streaks of brown. Phenergan given. 4 mg morphine given.  Patient resting comfortably. Will continue to monitor

## 2022-05-04 NOTE — Progress Notes (Signed)
RT called to PACU to place pt on BiPAP. RT placed pt on BiPAP and pt is tolerating well at this time.

## 2022-05-04 NOTE — Consult Note (Addendum)
NAME:  [name], MRN:  [id number], DOB:  Oct 19, 1965, LOS: 1 ADMISSION DATE:  05/02/2022, CONSULTATION DATE: 05/04/2022 REFERRING MD: Dr. 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 In PACU patient received 50 mics of fentanyl, with decreased responsiveness and change in mental status, requiring BiPAP.  She was transferred to ICU for continued care PCCM was consulted for help evaluation medical management  Pertinent  Medical History   Past Medical History:  Diagnosis Date   Anemia    Fibroid    Hyperlipidemia    NSTEMI (non-ST elevated myocardial infarction) ([place]) 06/2017   s/p DES to LCX 07/02/17   Sarcoidosis      Significant Hospital Events: Including procedures, antibiotic start and stop dates in addition to other pertinent events     Interim History / Subjective:  Still complaining of severe abdominal pain, tender to touch associated with nausea and vomiting  Objective   Blood pressure 126/70, pulse (!) 119, temperature (!) 97 F (36.1 C), resp. rate (!) 36, height '5\' 4"'$  (1.626 m), weight 90.7 kg, SpO2 100 %.    Vent Mode: PSV;BIPAP FiO2 (%):  [50 %] 50 %   Intake/Output Summary (Last 24 hours) at 05/04/2022 1339 Last data filed at 05/04/2022 1030 Gross per 24 hour  Intake 3643 ml  Output 950 ml  Net 2693 ml   Filed Weights   05/02/22 2026  Weight: 90.7 kg    Examination: Physical  exam: General: Acutely ill-appearing female, lying on the bed, in distress due to abdominal pain HEENT: [place]/AT, eyes anicteric.  moist mucus membranes Neuro: Alert, awake following commands Chest: Coarse breath sounds, no wheezes or rhonchi Heart: Tachycardic, regular rhythm, no murmurs or gallops Abdomen: Soft, diffusely tender, nondistended, absent bowel sounds present Skin: No rash   Resolved Hospital Problem list     Assessment & Plan:  Mesenteric ischemia in the setting of aortic and SMA thrombosis s/p mechanical thrombectomy and stent placement Persistent abdominal pain s/p ex lap, noted to have hemoperitoneum with viable bowel Coronary artery disease s/p stent in 2019 Acute hypoxic/hypercapnic respiratory failure in the setting of opiate medications Sarcoidosis Uncontrolled hypertension Hyperlipidemia  Management for mesenteric ischemia deferred to CCS and vascular surgery Recommend continuing aspirin and Plavix, holding anticoagulation, considering hemoperitoneum Patient with severe abdominal pain, will start her on fentanyl PCA As needed naloxone is on board Continue Zofran and Compazine She just came off of BiPAP Continue nasal cannula oxygen Patient is on prednisone at home, will hold off for now Patient blood pressure is not well-controlled, part of it could be due to persistent severe abdominal pain She is on Cardene infusion with SBP goal <160 Continue atorvastatin   Best Practice (right click and "Reselect all SmartList Selections" daily)   Diet/type: NPO DVT prophylaxis: SCD GI prophylaxis: N/A Lines: N/A Foley:  Yes, and it is still needed Code Status:  full code Last date of multidisciplinary goals of care discussion [Per primary team]  Labs   CBC: Recent Labs  Lab 05/02/22 2041 05/03/22 0814 05/03/22 1746  05/03/22 1957 05/04/22 0629 05/04/22 0653 05/04/22 0812 05/04/22 1125 05/04/22 1208  WBC 11.0*  --  11.5* 9.9  --   --   --  22.9*  --    NEUTROABS  --   --   --  7.1  --   --   --   --   --   HGB 14.7   < > 12.7 12.3 9.2* 9.5* 12.9 13.8 12.6  HCT 43.9   < > 39.1 37.7 27.0* 28.0* 38.0 38.0 37.0  MCV 80.4  --  83.5 82.7  --   --   --  77.9*  --   PLT 285  --  276 273  --   --   --  159  --    < > = values in this interval not displayed.    Basic Metabolic Panel: Recent Labs  Lab 05/02/22 2041 05/03/22 0814 05/03/22 1957 05/04/22 0629 05/04/22 0653 05/04/22 0812 05/04/22 1125 05/04/22 1208  NA 137   < > 138 136 138 141 140 139  K 4.3   < > 4.5 6.3* 6.3* 4.7 4.1 4.0  CL 103  --  108  --   --   --  110  --   CO2 24  --  21*  --   --   --  21*  --   GLUCOSE 138*  --  154*  --   --   --  191*  --   BUN 9  --  7  --   --   --  13  --   CREATININE 0.88  --  0.97  --   --   --  1.13*  --   CALCIUM 10.4*  --  9.5  --   --   --  8.2*  --    < > = values in this interval not displayed.   GFR: Estimated Creatinine Clearance: 60.6 mL/min (A) (by C-G formula based on SCr of 1.13 mg/dL (H)). Recent Labs  Lab 05/02/22 2041 05/03/22 0034 05/03/22 1746 05/03/22 1957 05/04/22 0245 05/04/22 1125 05/04/22 1130  WBC 11.0*  --  11.5* 9.9  --  22.9*  --   LATICACIDVEN  --  0.5  --  1.7 5.1*  --  3.7*    Liver Function Tests: Recent Labs  Lab 05/02/22 2041  AST 13*  ALT 19  ALKPHOS 110  BILITOT 0.5  PROT 7.2  ALBUMIN 4.1   Recent Labs  Lab 05/02/22 2041  LIPASE 22   No results for input(s): "AMMONIA" in the last 168 hours.  ABG    Component Value Date/Time   PHART 7.330 (L) 05/04/2022 1208   PCO2ART 39.7 05/04/2022 1208   PO2ART 89 05/04/2022 1208   HCO3 20.9 05/04/2022 1208   TCO2 22 05/04/2022 1208   ACIDBASEDEF 5.0 (H) 05/04/2022 1208   O2SAT 96 05/04/2022 1208     Coagulation Profile: No results for input(s): "INR", "PROTIME" in the last 168 hours.  Cardiac Enzymes: No results for input(s): "CKTOTAL", "CKMB", "CKMBINDEX", "TROPONINI" in the last 168 hours.  HbA1C: Hgb A1c MFr Bld  Date/Time  Value Ref Range Status  07/02/2017 02:58 AM 5.8 (H) 4.8 - 5.6 % Final    Comment:    (NOTE) Pre diabetes:          5.7%-6.4% Diabetes:              >6.4% Glycemic control for   <7.0% adults with diabetes  06/30/2017 10:01 PM 5.7 (H) 4.8 - 5.6 % Final    Comment:    (NOTE) Pre diabetes:          5.7%-6.4% Diabetes:              >6.4% Glycemic control for   <7.0% adults with diabetes     CBG: No results for input(s): "GLUCAP" in the last 168 hours.  Review of Systems:   12 point review of systems significant for complaint mentioned HPI, rest negative  Past Medical History:  She,  has a past medical history of Anemia, Fibroid, Hyperlipidemia, NSTEMI (non-ST elevated myocardial infarction) ([place]) (06/2017), and Sarcoidosis.   Surgical History:   Past Surgical History:  Procedure Laterality Date   ABDOMINAL HYSTERECTOMY     BREAST BIOPSY Right 03/24/2018   BREAST REDUCTION SURGERY Bilateral 10/17/2020   Procedure: MAMMARY REDUCTION  (BREAST);  Surgeon: Cindra Presume, MD;  Location: [facility];  Service: Plastics;  Laterality: Bilateral;  2 hours   CESAREAN SECTION  1987   CORONARY/GRAFT ACUTE MI REVASCULARIZATION N/A 06/30/2017   Procedure: Coronary/Graft Acute MI Revascularization;  Surgeon: Lorretta Harp, MD;  Location: [facility];  Service: Cardiovascular;  Laterality: N/A;   FOREARM FRACTURE SURGERY Right 1977   FRACTURE SURGERY     LEFT HEART CATH AND CORONARY ANGIOGRAPHY N/A 06/30/2017   Procedure: LEFT HEART CATH AND CORONARY ANGIOGRAPHY;  Surgeon: Lorretta Harp, MD;  Location: [facility];  Service: Cardiovascular;  Laterality: N/A;   LEFT HEART CATH AND CORONARY ANGIOGRAPHY N/A 12/23/2017   Procedure: LEFT HEART CATH AND CORONARY ANGIOGRAPHY;  Surgeon: Lorretta Harp, MD;  Location: [facility];  Service: Cardiovascular;  Laterality: N/A;   TUBAL LIGATION  1990     Social History:   reports that she has been smoking cigarettes. She has a  29.00 pack-year smoking history. She has never used smokeless tobacco. She reports current alcohol use. She reports that she does not use drugs.   Family History:  Her family history includes Breast cancer in her paternal grandmother; Diabetes in her mother; Hyperlipidemia in her father; Hypertension in her father and mother; Stroke (age of onset: 58) in her mother; Stroke (age of onset: 30) in her father.   Allergies Allergies  Allergen Reactions   Penicillins Itching    Caused rash and blisters   Hydrocodone Hives and Itching     Home Medications  Prior to Admission medications   Medication Sig Start Date End Date Taking? Authorizing Provider  albuterol (VENTOLIN HFA) 108 (90 Base) MCG/ACT inhaler Inhale 1-2 puffs into the lungs every 6 (six) hours as needed for wheezing or shortness of breath. 04/22/22  Yes Dorothyann Peng, PA-C  aspirin EC 81 MG tablet Take 1 tablet (81 mg total) by mouth daily. Swallow whole. 12/04/19  Yes Lorretta Harp, MD  atorvastatin (LIPITOR) 20 MG tablet TAKE 1 TABLET BY MOUTH EVERY DAY Patient taking differently: Take 20 mg by mouth daily. 02/23/22  Yes Lorretta Harp, MD  benzonatate (TESSALON) 100 MG capsule Take 1 capsule (100 mg total) by mouth every 8 (eight) hours. 04/22/22  Yes Dorothyann Peng, PA-C  cholecalciferol (VITAMIN D3) 25 MCG (1000 UNIT) tablet Take 2,000 Units by mouth daily. Gummies   Yes [provider]  gabapentin (NEURONTIN) 300 MG capsule Take 1 capsule (300 mg total) by mouth at bedtime. 04/12/22 07/11/22 Yes McDonald, Stephan Minister, DPM  predniSONE (DELTASONE) 10 MG tablet Take 2  tablets (20 mg total) by mouth daily for 10 days. 04/22/22 05/04/23 Yes Dorothyann Peng, PA-C     Critical care time:      Total critical care time: 35 minutes  Performed by: [place] care time was exclusive of separately billable procedures and treating other patients.   Critical care was necessary to treat or prevent  imminent or life-threatening deterioration.   Critical care was time spent personally by me on the following activities: development of treatment plan with patient and/or surrogate as well as nursing, discussions with consultants, evaluation of patient's response to treatment, examination of patient, obtaining history from patient or surrogate, ordering and performing treatments and interventions, ordering and review of laboratory studies, ordering and review of radiographic studies, pulse oximetry and re-evaluation of patient's condition.   Jacky Kindle, MD [facility] See Amion for pager If no response to pager, please call [phone] until 7pm After 7pm, Please call E-link [phone]

## 2022-05-04 NOTE — Progress Notes (Addendum)
[facility]  Subjective  - sleepy but arousable in PACU   Objective 123/72 (!) 121 98.6 F (37 C) (Rectal) (!) 21 96%  Intake/Output Summary (Last 24 hours) at 05/04/2022 0932 Last data filed at 05/04/2022 0707 Gross per 24 hour  Intake 3643 ml  Output 600 ml  Net 3043 ml    Midline abdominal incision closed.  Abdomen appropriately tender. Right groin with a palpable femoral pulse and no hematoma after transfemoral access Arterial line in the left groin placed by anesthesia Bilateral DP signals dopplerable  Laboratory Lab Results: Recent Labs    05/03/22 1746 05/03/22 1957 05/04/22 0629 05/04/22 0653 05/04/22 0812  WBC 11.5* 9.9  --   --   --   HGB 12.7 12.3   < > 9.5* 12.9  HCT 39.1 37.7   < > 28.0* 38.0  PLT 276 273  --   --   --    < > = values in this interval not displayed.   BMET Recent Labs    05/02/22 2041 05/03/22 0814 05/03/22 1957 05/04/22 0629 05/04/22 0653 05/04/22 0812  NA 137   < > 138   < > 138 141  K 4.3   < > 4.5   < > 6.3* 4.7  CL 103  --  108  --   --   --   CO2 24  --  21*  --   --   --   GLUCOSE 138*  --  154*  --   --   --   BUN 9  --  7  --   --   --   CREATININE 0.88  --  0.97  --   --   --   CALCIUM 10.4*  --  9.5  --   --   --    < > = values in this interval not displayed.    COAG No results found for: "INR", "PROTIME" No results found for: "PTT"  Assessment/Planning:  56 year old female who presented yesterday as a transfer from [place] with acute mesenteric ischemia with thrombus in the SMA.  She went to the OR yesterday morning with right transfemoral access and we performed percutaneous thrombectomy of the SMA and also placed an SMA stent for significant chronic disease.  Excellent results.  Unfortunately she developed increasing abdominal pain in the evening and that her lactic acid went up.  General surgery was consulted for evaluation of mesenteric ischemia.  She underwent  laparotomy with all the bowel viable but findings of blood in the peritoneal cavity with no active bleeding.  Plan transfer to the ICU.  Will hold heparin for 24 hours.  Continue aspirin and Plavix for mesenteric stent.  Will keep n.p.o. today and IVF for hydration.  Will trend hemoglobin and lactic acid.  Last Hgb 12.9 this morning at 8 am.  She has Doppler signals in both feet.  Her right groin transfemoral access site looks fine.  Remains tender given her laparotomy and was seen immediately postop in the recovery room.  Marty Heck 05/04/2022 9:32 AM --

## 2022-05-04 NOTE — Anesthesia Procedure Notes (Signed)
Central Venous Catheter Insertion Performed by: Albertha Ghee, MD, anesthesiologist Start/End11/24/2023 4:50 AM, 05/04/2022 5:02 AM Patient location: OR. Preanesthetic checklist: patient identified, IV checked, site marked, risks and benefits discussed, surgical consent, monitors and equipment checked, pre-op evaluation, timeout performed and anesthesia consent Position: Trendelenburg Patient sedated Hand hygiene performed , maximum sterile barriers used  and Seldinger technique used Catheter size: 8 Fr Central line was placed.Double lumen Procedure performed using ultrasound guided technique. Ultrasound Notes:anatomy identified, needle tip was noted to be adjacent to the nerve/plexus identified, no ultrasound evidence of intravascular and/or intraneural injection and image(s) printed for medical record Attempts: 1 Following insertion, line sutured, dressing applied and Biopatch. Post procedure assessment: blood return through all ports, free fluid flow and no air  Patient tolerated the procedure well with no immediate complications.

## 2022-05-04 NOTE — Plan of Care (Signed)
  Problem: Cardiovascular: Goal: Vascular access site(s) Level 0-1 will be maintained Outcome: Progressing   

## 2022-05-05 ENCOUNTER — Encounter (HOSPITAL_COMMUNITY): Payer: Self-pay | Admitting: General Surgery

## 2022-05-05 DIAGNOSIS — K55059 Acute (reversible) ischemia of intestine, part and extent unspecified: Secondary | ICD-10-CM | POA: Diagnosis not present

## 2022-05-05 DIAGNOSIS — K55069 Acute infarction of intestine, part and extent unspecified: Secondary | ICD-10-CM | POA: Diagnosis not present

## 2022-05-05 DIAGNOSIS — I7410 Embolism and thrombosis of unspecified parts of aorta: Secondary | ICD-10-CM | POA: Diagnosis not present

## 2022-05-05 DIAGNOSIS — Z9889 Other specified postprocedural states: Secondary | ICD-10-CM | POA: Diagnosis not present

## 2022-05-05 LAB — TYPE AND SCREEN
ABO/RH(D): B POS
Antibody Screen: NEGATIVE
Unit division: 0
Unit division: 0
Unit division: 0
Unit division: 0

## 2022-05-05 LAB — BPAM RBC
Blood Product Expiration Date: 202312132359
Blood Product Expiration Date: 202312142359
Blood Product Expiration Date: 202312152359
Blood Product Expiration Date: 202312162359
ISSUE DATE / TIME: 202311240540
ISSUE DATE / TIME: 202311240540
ISSUE DATE / TIME: 202311240540
ISSUE DATE / TIME: 202311240540
Unit Type and Rh: 7300
Unit Type and Rh: 7300
Unit Type and Rh: 7300
Unit Type and Rh: 7300

## 2022-05-05 LAB — MRSA NEXT GEN BY PCR, NASAL: MRSA by PCR Next Gen: NOT DETECTED

## 2022-05-05 LAB — GLUCOSE, CAPILLARY
Glucose-Capillary: 100 mg/dL — ABNORMAL HIGH (ref 70–99)
Glucose-Capillary: 126 mg/dL — ABNORMAL HIGH (ref 70–99)
Glucose-Capillary: 128 mg/dL — ABNORMAL HIGH (ref 70–99)
Glucose-Capillary: 138 mg/dL — ABNORMAL HIGH (ref 70–99)
Glucose-Capillary: 139 mg/dL — ABNORMAL HIGH (ref 70–99)
Glucose-Capillary: 143 mg/dL — ABNORMAL HIGH (ref 70–99)

## 2022-05-05 MED ORDER — HEPARIN SODIUM (PORCINE) 5000 UNIT/ML IJ SOLN
5000.0000 [IU] | Freq: Three times a day (TID) | INTRAMUSCULAR | Status: DC
  Administered 2022-05-05 – 2022-05-16 (×33): 5000 [IU] via SUBCUTANEOUS
  Filled 2022-05-05 (×33): qty 1

## 2022-05-05 MED ORDER — DEXTROSE IN LACTATED RINGERS 5 % IV SOLN: INTRAVENOUS | Status: DC

## 2022-05-05 MED ORDER — CLONIDINE HCL 0.2 MG/24HR TD PTWK
0.2000 mg | MEDICATED_PATCH | TRANSDERMAL | Status: DC
  Administered 2022-05-05: 0.2 mg via TRANSDERMAL
  Filled 2022-05-05: qty 1

## 2022-05-05 MED ORDER — LABETALOL HCL 5 MG/ML IV SOLN
10.0000 mg | INTRAVENOUS | Status: DC | PRN
  Administered 2022-05-05: 10 mg via INTRAVENOUS
  Filled 2022-05-05: qty 4

## 2022-05-05 NOTE — Progress Notes (Signed)
Pt refuseing BIPAP due to NG tube and pain

## 2022-05-05 NOTE — Anesthesia Postprocedure Evaluation (Signed)
Anesthesia Post Note  Patient: [name]  Procedure(s) Performed: LAPAROSCOPY DIAGNOSTIC EXPLORATORY LAPAROTOMY     Patient location during evaluation: PACU Anesthesia Type: General Level of consciousness: awake Pain management: pain level controlled Vital Signs Assessment: post-procedure vital signs reviewed and stable Respiratory status: spontaneous breathing, nonlabored ventilation, respiratory function stable and patient connected to nasal cannula oxygen Cardiovascular status: blood pressure returned to baseline and stable Postop Assessment: no apparent nausea or vomiting Anesthetic complications: no   No notable events documented.  Last Vitals:  Vitals:   05/05/22 0900 05/05/22 0915  BP:    Pulse: (!) 115 (!) 119  Resp: (!) 27 (!) 34  Temp:    SpO2: 95% 96%    Last Pain:  Vitals:   05/05/22 0800  TempSrc:   PainSc: [name]

## 2022-05-05 NOTE — Plan of Care (Signed)
  Problem: Nutrition: Goal: Adequate nutrition will be maintained Outcome: Not Progressing   Remains NPO with NGT in place for decompression.

## 2022-05-05 NOTE — Progress Notes (Addendum)
NAME:  [name], MRN:  [id number], DOB:  May 14, 1966, LOS: 2 ADMISSION DATE:  05/02/2022, CONSULTATION DATE: 05/04/2022 REFERRING MD: Dr. 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 In PACU patient received 50 mics of fentanyl, with decreased responsiveness and change in mental status, requiring BiPAP.  She was transferred to ICU for continued care PCCM was consulted for help evaluation medical management  Pertinent  Medical History   Past Medical History:  Diagnosis Date   Anemia    Fibroid    Hyperlipidemia    NSTEMI (non-ST elevated myocardial infarction) ([place]) 06/2017   s/p DES to LCX 07/02/17   Sarcoidosis      Significant Hospital Events: Including procedures, antibiotic start and stop dates in addition to other pertinent events     Interim History / Subjective:  Patient stated abdominal pain is better except when she coughs She required BiPAP overnight, currently on 4 L nasal cannula oxygen  Objective   Blood pressure (!) 161/60, pulse (!) 112, temperature 98.3 F (36.8 C), temperature source Oral, resp. rate (!) 34, height '5\' 4"'$  (1.626 m), weight 90.7 kg, SpO2 95 %.    Vent Mode: BIPAP;PSV FiO2 (%):  [50 %] 50 % PEEP:  [6 cmH20] 6 cmH20   Intake/Output Summary (Last 24 hours) at 05/05/2022 0746 Last data filed at 05/05/2022 0600 Gross per 24 hour  Intake 2176.81 ml  Output 1090 ml  Net 1086.81  ml   Filed Weights   05/02/22 2026  Weight: 90.7 kg    Examination: Physical exam: General: Acutely ill-appearing female, lying on the bed HEENT: [place]/AT, eyes anicteric.  moist mucus membranes.  NGT in place Neuro: Sleepy but opens eyes with vocal stimuli, following simple commands Chest: Tachypneic, reduced air entry all over, no wheezes or rhonchi Heart: Tachycardic, regular rhythm, no murmurs or gallops Abdomen: Soft, diffusely tender, nondistended, absent bowel sounds present Skin: No rash   Resolved Hospital Problem list     Assessment & Plan:  Mesenteric ischemia in the setting of aortic and SMA thrombosis s/p mechanical thrombectomy and stent placement Persistent abdominal pain s/p ex lap, noted to have hemoperitoneum with viable bowel Coronary artery disease s/p stent in 2019 Acute hypoxic/hypercapnic respiratory failure in the setting of opiate medications and now with atelectasis Sarcoidosis Uncontrolled hypertension Hyperlipidemia  Management for mesenteric ischemia deferred to CCS and vascular surgery Recommend continuing aspirin and Plavix, holding anticoagulation, considering hemoperitoneum Continue IV Zosyn Change IV fluid to D5 LR at 75 cc Patient abdominal pain is better with fentanyl PCA now As needed naloxone is on board Continue Zofran and Compazine Required BiPAP again overnight, currently on 4 L nasal cannula oxygen She just came off of BiPAP Continue nasal cannula oxygen Patient is on prednisone at home, will hold off for now Patient remained hypertensive, now pain is better controlled She is on clevidipine 20 mg/h with SBP goal less than 160 Continue labetalol as needed Started on clonidine 0.2 mg patch per week  Continue atorvastatin   Best Practice (right click and "Reselect  all SmartList Selections" daily)   Diet/type: NPO DVT prophylaxis: SCD GI prophylaxis: N/A Lines: N/A Foley:  Yes, and it is still needed Code Status:  full  code Last date of multidisciplinary goals of care discussion [Per primary team]  Labs   CBC: Recent Labs  Lab 05/02/22 2041 05/03/22 0814 05/03/22 1746 05/03/22 1957 05/04/22 0629 05/04/22 0653 05/04/22 0812 05/04/22 1125 05/04/22 1208 05/04/22 1702  WBC 11.0*  --  11.5* 9.9  --   --   --  22.9*  --  19.6*  NEUTROABS  --   --   --  7.1  --   --   --   --   --   --   HGB 14.7   < > 12.7 12.3   < > 9.5* 12.9 13.8 12.6 12.7  HCT 43.9   < > 39.1 37.7   < > 28.0* 38.0 38.0 37.0 35.9*  MCV 80.4  --  83.5 82.7  --   --   --  77.9*  --  79.6*  PLT 285  --  276 273  --   --   --  159  --  174   < > = values in this interval not displayed.    Basic Metabolic Panel: Recent Labs  Lab 05/02/22 2041 05/03/22 0814 05/03/22 1957 05/04/22 0629 05/04/22 0653 05/04/22 0812 05/04/22 1125 05/04/22 1208 05/04/22 1702  NA 137   < > 138   < > 138 141 140 139 140  K 4.3   < > 4.5   < > 6.3* 4.7 4.1 4.0 4.5  CL 103  --  108  --   --   --  110  --  109  CO2 24  --  21*  --   --   --  21*  --  22  GLUCOSE 138*  --  154*  --   --   --  191*  --  171*  BUN 9  --  7  --   --   --  13  --  13  CREATININE 0.88  --  0.97  --   --   --  1.13*  --  1.07*  CALCIUM 10.4*  --  9.5  --   --   --  8.2*  --  8.2*   < > = values in this interval not displayed.   GFR: Estimated Creatinine Clearance: 64 mL/min (A) (by C-G formula based on SCr of 1.07 mg/dL (H)). Recent Labs  Lab 05/03/22 0034 05/03/22 1746 05/03/22 1957 05/04/22 0245 05/04/22 1125 05/04/22 1130 05/04/22 1702  WBC  --  11.5* 9.9  --  22.9*  --  19.6*  LATICACIDVEN 0.5  --  1.7 5.1*  --  3.7*  --     Liver Function Tests: Recent Labs  Lab 05/02/22 2041  AST 13*  ALT 19  ALKPHOS 110  BILITOT 0.5  PROT 7.2  ALBUMIN 4.1   Recent Labs  Lab 05/02/22 2041  LIPASE 22   No results for input(s): "AMMONIA" in the last 168 hours.  ABG    Component Value Date/Time   PHART 7.330 (L) 05/04/2022 1208   PCO2ART 39.7 05/04/2022  1208   PO2ART 89 05/04/2022 1208   HCO3 20.9 05/04/2022 1208   TCO2 22 05/04/2022 1208   ACIDBASEDEF 5.0 (H) 05/04/2022 1208   O2SAT 96 05/04/2022 1208     Coagulation Profile: No results for input(s): "INR", "PROTIME" in the last  168 hours.  Cardiac Enzymes: No results for input(s): "CKTOTAL", "CKMB", "CKMBINDEX", "TROPONINI" in the last 168 hours.  HbA1C: Hgb A1c MFr Bld  Date/Time Value Ref Range Status  07/02/2017 02:58 AM 5.8 (H) 4.8 - 5.6 % Final    Comment:    (NOTE) Pre diabetes:          5.7%-6.4% Diabetes:              >6.4% Glycemic control for   <7.0% adults with diabetes   06/30/2017 10:01 PM 5.7 (H) 4.8 - 5.6 % Final    Comment:    (NOTE) Pre diabetes:          5.7%-6.4% Diabetes:              >6.4% Glycemic control for   <7.0% adults with diabetes     CBG: Recent Labs  Lab 05/04/22 2356 05/05/22 0400  GLUCAP 160* 138*     Total critical care time: 34 minutes  Performed by: [place] care time was exclusive of separately billable procedures and treating other patients.   Critical care was necessary to treat or prevent imminent or life-threatening deterioration.   Critical care was time spent personally by me on the following activities: development of treatment plan with patient and/or surrogate as well as nursing, discussions with consultants, evaluation of patient's response to treatment, examination of patient, obtaining history from patient or surrogate, ordering and performing treatments and interventions, ordering and review of laboratory studies, ordering and review of radiographic studies, pulse oximetry and re-evaluation of patient's condition.   Jacky Kindle, MD [facility] See Amion for pager If no response to pager, please call [phone] until 7pm After 7pm, Please call E-link [phone]

## 2022-05-05 NOTE — Anesthesia Postprocedure Evaluation (Signed)
Anesthesia Post Note  Patient: [name]  Procedure(s) Performed: MESENTERIC ANGIOGRAM, ULTRASOUND GUIDED ACCESS OF RIGHT COMMON FEMORAL ARTERY, PERCUTANEOUS THROMBECTOMY OF SMA, SMA STENT PLACEMENT (Groin)     Patient location during evaluation: PACU Anesthesia Type: General Level of consciousness: awake and alert Pain management: pain level controlled Vital Signs Assessment: post-procedure vital signs reviewed and stable Respiratory status: spontaneous breathing, nonlabored ventilation, respiratory function stable and patient connected to nasal cannula oxygen Cardiovascular status: blood pressure returned to baseline and stable Postop Assessment: no apparent nausea or vomiting Anesthetic complications: no   No notable events documented.  Last Vitals:  Vitals:   05/05/22 0900 05/05/22 0915  BP:    Pulse: (!) 115 (!) 119  Resp: (!) 27 (!) 34  Temp:    SpO2: 95% 96%    Last Pain:  Vitals:   05/05/22 0800  TempSrc:   PainSc: [place]

## 2022-05-05 NOTE — Progress Notes (Signed)
[facility] Progress Note Patient Name: TERSEA AULDS DOB: 11/07/1965 MRN: [id number]   Date of Service  05/05/2022  HPI/Events of Note  Pt's central line accidentally got pulled out as she was being transferred from bed to chair.  On camera evaluation, pt sitting comfortably, staff at bedside, applying pressure on site.  No significant bleed.  No pressors running, only fentanyl via PCA.   eICU Interventions  Will have pt lie down and have site dressed.  Will have staff arrange for peripheral IV and have pain medications run through there instead.  Will continue to monitor closely.         Rondi Ivy M DELA CRUZ 05/05/2022, 8:46 PM

## 2022-05-05 NOTE — Progress Notes (Addendum)
    Subjective  - POD #2, s/p SMA thrombectomy and ex-lap  Still with abdominal pain   Physical Exam:  Abd tender  Doppler pedal signals  Minimal NG output     Assessment/Plan:  POD #2  CV:  on cleveprex for HTN Pulm: on and off bipap Vasc:  on ASA and plavix, had hemoperitoneum, will hold off on heparin and consider tomorrow GI:  NG with minimal oputput, no flatus Prophylaxis: protonix, will start SQ heparin ID:  continue zosyn, WBC down to 19 Renal:  pink tinged urine, creatinine  1.07 Activity:  OOB today  Wells Suhani Stillion 05/05/2022 9:10 AM --  Vitals:   05/05/22 0830 05/05/22 0845  BP:    Pulse: (!) 113 (!) 118  Resp: (!) 24 (!) 34  Temp:    SpO2: 96% 96%    Intake/Output Summary (Last 24 hours) at 05/05/2022 0910 Last data filed at 05/05/2022 0800 Gross per 24 hour  Intake 2456.85 ml  Output 915 ml  Net 1541.85 ml     Laboratory CBC    Component Value Date/Time   WBC 19.6 (H) 05/04/2022 1702   HGB 12.7 05/04/2022 1702   HCT 35.9 (L) 05/04/2022 1702   PLT 174 05/04/2022 1702    BMET    Component Value Date/Time   NA 140 05/04/2022 1702   NA 141 04/12/2022 1025   K 4.5 05/04/2022 1702   CL 109 05/04/2022 1702   CO2 22 05/04/2022 1702   GLUCOSE 171 (H) 05/04/2022 1702   BUN 13 05/04/2022 1702   BUN 8 04/12/2022 1025   CREATININE 1.07 (H) 05/04/2022 1702   CREATININE 0.80 03/13/2015 1628   CALCIUM 8.2 (L) 05/04/2022 1702   GFRNONAA >60 05/04/2022 1702   GFRAA >60 09/09/2018 1659    COAG No results found for: "INR", "PROTIME" No results found for: "PTT"  Antibiotics Anti-infectives (From admission, onward)    Start     Dose/Rate Route Frequency Ordered Stop   05/04/22 1830  piperacillin-tazobactam (ZOSYN) IVPB 3.375 g        3.375 g 12.5 mL/hr over 240 Minutes Intravenous Every 8 hours 05/04/22 1818     05/04/22 0430  ciprofloxacin (CIPRO) IVPB 400 mg        400 mg 200 mL/hr over 60 Minutes Intravenous STAT 05/04/22 0331  05/04/22 0438   05/04/22 0430  metroNIDAZOLE (FLAGYL) IVPB 500 mg        500 mg 100 mL/hr over 60 Minutes Intravenous STAT 05/04/22 0331 05/04/22 0545        V. Leia Alf, M.D., [facility] [place] Office: [phone] Pager:  [phone]

## 2022-05-06 DIAGNOSIS — Z9889 Other specified postprocedural states: Secondary | ICD-10-CM | POA: Diagnosis not present

## 2022-05-06 DIAGNOSIS — I7410 Embolism and thrombosis of unspecified parts of aorta: Secondary | ICD-10-CM | POA: Diagnosis not present

## 2022-05-06 DIAGNOSIS — K55059 Acute (reversible) ischemia of intestine, part and extent unspecified: Secondary | ICD-10-CM | POA: Diagnosis not present

## 2022-05-06 LAB — GLUCOSE, CAPILLARY
Glucose-Capillary: 76 mg/dL (ref 70–99)
Glucose-Capillary: 76 mg/dL (ref 70–99)
Glucose-Capillary: 88 mg/dL (ref 70–99)
Glucose-Capillary: 91 mg/dL (ref 70–99)
Glucose-Capillary: 92 mg/dL (ref 70–99)
Glucose-Capillary: 92 mg/dL (ref 70–99)

## 2022-05-06 LAB — CBC
HCT: 28.9 % — ABNORMAL LOW (ref 36.0–46.0)
Hemoglobin: 10 g/dL — ABNORMAL LOW (ref 12.0–15.0)
MCH: 28 pg (ref 26.0–34.0)
MCHC: 34.6 g/dL (ref 30.0–36.0)
MCV: 81 fL (ref 80.0–100.0)
Platelets: 133 10*3/uL — ABNORMAL LOW (ref 150–400)
RBC: 3.57 MIL/uL — ABNORMAL LOW (ref 3.87–5.11)
RDW: 16.7 % — ABNORMAL HIGH (ref 11.5–15.5)
WBC: 14.6 10*3/uL — ABNORMAL HIGH (ref 4.0–10.5)
nRBC: 0.2 % (ref 0.0–0.2)

## 2022-05-06 LAB — BASIC METABOLIC PANEL
Anion gap: 8 (ref 5–15)
BUN: 9 mg/dL (ref 6–20)
CO2: 23 mmol/L (ref 22–32)
Calcium: 8.5 mg/dL — ABNORMAL LOW (ref 8.9–10.3)
Chloride: 109 mmol/L (ref 98–111)
Creatinine, Ser: 0.85 mg/dL (ref 0.44–1.00)
GFR, Estimated: >60 mL/min (ref >60)
Glucose, Bld: 107 mg/dL — ABNORMAL HIGH (ref 70–99)
Potassium: 4 mmol/L (ref 3.5–5.1)
Sodium: 140 mmol/L (ref 135–145)

## 2022-05-06 LAB — TRIGLYCERIDES: Triglycerides: 62 mg/dL (ref <150)

## 2022-05-06 MED ORDER — GUAIFENESIN 100 MG/5ML PO LIQD
5.0000 mL | ORAL | Status: DC | PRN
  Administered 2022-05-06 – 2022-05-12 (×9): 5 mL
  Filled 2022-05-06 (×9): qty 10

## 2022-05-06 MED ORDER — PIPERACILLIN-TAZOBACTAM 3.375 G IVPB
3.3750 g | Freq: Three times a day (TID) | INTRAVENOUS | Status: AC
  Administered 2022-05-06 – 2022-05-14 (×25): 3.375 g via INTRAVENOUS
  Filled 2022-05-06 (×25): qty 50

## 2022-05-06 NOTE — Progress Notes (Signed)
Refused bipap.

## 2022-05-06 NOTE — Progress Notes (Signed)
   05/06/22 1857  Vitals  Temp 98.5 F (36.9 C)  Temp Source Oral  BP (!) 154/80  MAP (mmHg) 100  BP Location Left Arm  BP Method Automatic  Patient Position (if appropriate) Lying  Pulse Rate (!) 118  Pulse Rate Source Monitor  ECG Heart Rate (!) 116  Resp (!) 29  MEWS COLOR  MEWS Score Color Red  Oxygen Therapy  SpO2 97 %  O2 Device Nasal Cannula  O2 Flow Rate (L/min) 3 L/min  MEWS Score  MEWS Temp 0  MEWS Systolic 0  MEWS Pulse 2  MEWS RR 2  MEWS LOC 0  MEWS Score 4   Patient brought to 4E from [place]. Telemetry box applied, CCMD notified. Box 1. Patient oriented to room and staff. Call bell in reach.

## 2022-05-06 NOTE — Progress Notes (Signed)
NAME:  [name], MRN:  [id number], DOB:  February 09, 1966, LOS: 3 ADMISSION DATE:  05/02/2022, CONSULTATION DATE: 05/04/2022 REFERRING MD: Dr. 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 In PACU patient received 50 mics of fentanyl, with decreased responsiveness and change in mental status, requiring BiPAP.  She was transferred to ICU for continued care PCCM was consulted for help evaluation medical management  Pertinent  Medical History   Past Medical History:  Diagnosis Date   Anemia    Fibroid    Hyperlipidemia    NSTEMI (non-ST elevated myocardial infarction) ([place]) 06/2017   s/p DES to LCX 07/02/17   Sarcoidosis      Significant Hospital Events: Including procedures, antibiotic start and stop dates in addition to other pertinent events     Interim History / Subjective:  Patient stated abdominal pain is better controlled with pain meds On trach and nasal cannula oxygen  Objective   Blood pressure (!) 153/75, pulse (!) 117, temperature 98.8 F (37.1 C), temperature source Oral, resp. rate 20, height '5\' 4"'$  (1.626 m), weight 93.9 kg, SpO2 100 %.        Intake/Output Summary (Last 24 hours) at 05/06/2022 1123 Last data filed at 05/06/2022 0900 Gross per 24 hour  Intake 1101.07 ml  Output 575 ml  Net 526.07 ml   Filed Weights   05/02/22 2026 05/06/22 0700  Weight: 90.7 kg 93.9 kg    Examination: Physical  exam: General: Acutely ill-appearing female, sitting on the couch HEENT: /AT, eyes anicteric.  moist mucus membranes.  NGT in place with minimal output Neuro: Sleepy but opens eyes with vocal stimuli, following simple commands Chest: Tachypneic, reduced air entry all over, no wheezes or rhonchi Heart: Regular rate and rhythm, no murmurs or gallops Abdomen: Soft, diffusely tender, nondistended, absent bowel sounds present Skin: No rash   Resolved Hospital Problem list     Assessment & Plan:  Mesenteric ischemia in the setting of aortic and SMA thrombosis s/p mechanical thrombectomy and stent placement Persistent abdominal pain s/p ex lap, noted to have hemoperitoneum with viable bowel Postop ileus Coronary artery disease s/p stent in 2019 Acute hypoxic/hypercapnic respiratory failure in the setting of opiate medications and now with atelectasis Sarcoidosis Uncontrolled hypertension Hyperlipidemia  Management for mesenteric ischemia deferred to CCS and vascular surgery Still with postop ileus, continue NGT on low intermittent wall suction Continue aspirin and Plavix, holding anticoagulation, considering hemoperitoneum for now, may resume tomorrow Continue IV Zosyn Continue n.p.o. Continue IV fluid to D5 LR at 75 cc Patient abdominal pain is better with fentanyl PCA now As needed naloxone is on board Continue Zofran and Compazine Continue nasal cannula oxygen titrate with O2 sat goal 94% Patient is on prednisone at home, will hold off for now Blood pressure is better controlled, continue as needed labetalol Cleviprex is off Continue clonidine 0.2 mg patch per week Continue atorvastatin   Best Practice (right click and "Reselect all SmartList Selections" daily)   Diet/type: NPO DVT prophylaxis: SCD GI prophylaxis: N/A Lines: N/A Foley:  Yes, and it is still needed Code Status:  full code Last date of multidisciplinary goals of care discussion [Per primary team]  Labs    CBC: Recent Labs  Lab 05/03/22 1746 05/03/22 1957 05/04/22 0629 05/04/22 0812 05/04/22 1125 05/04/22 1208 05/04/22 1702 05/06/22 0239  WBC 11.5* 9.9  --   --  22.9*  --  19.6* 14.6*  NEUTROABS  --  7.1  --   --   --   --   --   --   HGB 12.7 12.3   < > 12.9 13.8 12.6 12.7 10.0*  HCT 39.1 37.7   < > 38.0 38.0 37.0 35.9* 28.9*  MCV 83.5 82.7  --   --  77.9*  --  79.6* 81.0  PLT 276 273  --   --  159  --  174 133*   < > = values in this interval not displayed.    Basic Metabolic Panel: Recent Labs  Lab 05/02/22 2041 05/03/22 0814 05/03/22 1957 05/04/22 0629 05/04/22 0812 05/04/22 1125 05/04/22 1208 05/04/22 1702 05/06/22 0239  NA 137   < > 138   < > 141 140 139 140 140  K 4.3   < > 4.5   < > 4.7 4.1 4.0 4.5 4.0  CL 103  --  108  --   --  110  --  109 109  CO2 24  --  21*  --   --  21*  --  22 23  GLUCOSE 138*  --  154*  --   --  191*  --  171* 107*  BUN 9  --  7  --   --  13  --  13 9  CREATININE 0.88  --  0.97  --   --  1.13*  --  1.07* 0.85  CALCIUM 10.4*  --  9.5  --   --  8.2*  --  8.2* 8.5*   < > = values in this interval not displayed.   GFR: Estimated Creatinine Clearance: 82.1 mL/min (by C-G formula based on SCr of 0.85 mg/dL). Recent Labs  Lab 05/03/22 0034 05/03/22 1746 05/03/22 1957 05/04/22 0245 05/04/22 1125 05/04/22 1130 05/04/22 1702 05/06/22 0239  WBC  --    < > 9.9  --  22.9*  --  19.6* 14.6*  LATICACIDVEN 0.5  --  1.7 5.1*  --  3.7*  --   --    < > = values in this interval not displayed.    Liver Function Tests: Recent Labs  Lab 05/02/22 2041  AST 13*  ALT 19  ALKPHOS 110  BILITOT 0.5  PROT 7.2  ALBUMIN 4.1   Recent Labs  Lab 05/02/22 2041  LIPASE 22   No results for input(s): "AMMONIA" in the last 168 hours.  ABG    Component Value Date/Time   PHART 7.330 (L) 05/04/2022 1208   PCO2ART 39.7 05/04/2022 1208   PO2ART 89 05/04/2022 1208   HCO3 20.9 05/04/2022 1208   TCO2 22 05/04/2022 1208   ACIDBASEDEF 5.0 (H)  05/04/2022 1208   O2SAT 96 05/04/2022 1208     Coagulation Profile: No results for input(s): "INR", "PROTIME" in the last 168 hours.  Cardiac Enzymes: No results for input(s): "CKTOTAL", "CKMB", "CKMBINDEX", "TROPONINI" in the last 168 hours.  HbA1C: Hgb A1c MFr Bld  Date/Time Value Ref Range Status  07/02/2017 02:58 AM 5.8 (H) 4.8 - 5.6 % Final    Comment:    (NOTE) Pre  diabetes:          5.7%-6.4% Diabetes:              >6.4% Glycemic control for   <7.0% adults with diabetes   06/30/2017 10:01 PM 5.7 (H) 4.8 - 5.6 % Final    Comment:    (NOTE) Pre diabetes:          5.7%-6.4% Diabetes:              >6.4% Glycemic control for   <7.0% adults with diabetes     CBG: Recent Labs  Lab 05/05/22 1544 05/05/22 1931 05/05/22 2336 05/06/22 0323 05/06/22 0847  GLUCAP 128* 126* 100* 88 92     Jacky Kindle, MD [facility] See Amion for pager If no response to pager, please call [phone] until 7pm After 7pm, Please call E-link [phone]

## 2022-05-06 NOTE — Progress Notes (Signed)
    Subjective  - POD #3  No complaints, No flatus   Physical Exam:  Abd remains tender Feet are warm Mild pulm congestion       Assessment/Plan:  POD #3  CV:  on cleveprex for HTN Pulm: on and off bipap, encourage IS Vasc:  on ASA and plavix, had hemoperitoneum, started SQ heparin yesterday.  ? IV heparin tomorrow GI:  NG with minimal oputput, no flatus.  Patient wants It out, I encouraged her to keep it in as she is at risk for a prolonged ileus Prophylaxis: protonix, SQ heparin ID:  continue zosyn, WBC down to 14 from 19 Renal:  pink tinged urine, creatinine  1.07 Activity:  OOB walking today    Wells Morell Mears 05/06/2022 7:59 AM --  Vitals:   05/06/22 0600 05/06/22 0700  BP: (!) 148/81   Pulse: (!) 124 (!) 125  Resp: (!) 22 (!) 33  Temp:    SpO2: (!) 82% 95%    Intake/Output Summary (Last 24 hours) at 05/06/2022 0759 Last data filed at 05/06/2022 0600 Gross per 24 hour  Intake 1653.11 ml  Output 800 ml  Net 853.11 ml     Laboratory CBC    Component Value Date/Time   WBC 14.6 (H) 05/06/2022 0239   HGB 10.0 (L) 05/06/2022 0239   HCT 28.9 (L) 05/06/2022 0239   PLT 133 (L) 05/06/2022 0239    BMET    Component Value Date/Time   NA 140 05/06/2022 0239   NA 141 04/12/2022 1025   K 4.0 05/06/2022 0239   CL 109 05/06/2022 0239   CO2 23 05/06/2022 0239   GLUCOSE 107 (H) 05/06/2022 0239   BUN 9 05/06/2022 0239   BUN 8 04/12/2022 1025   CREATININE 0.85 05/06/2022 0239   CREATININE 0.80 03/13/2015 1628   CALCIUM 8.5 (L) 05/06/2022 0239   GFRNONAA >60 05/06/2022 0239   GFRAA >60 09/09/2018 1659    COAG No results found for: "INR", "PROTIME" No results found for: "PTT"  Antibiotics Anti-infectives (From admission, onward)    Start     Dose/Rate Route Frequency Ordered Stop   05/04/22 1830  piperacillin-tazobactam (ZOSYN) IVPB 3.375 g        3.375 g 12.5 mL/hr over 240 Minutes Intravenous Every 8 hours 05/04/22 1818     05/04/22 0430   ciprofloxacin (CIPRO) IVPB 400 mg        400 mg 200 mL/hr over 60 Minutes Intravenous STAT 05/04/22 0331 05/04/22 0438   05/04/22 0430  metroNIDAZOLE (FLAGYL) IVPB 500 mg        500 mg 100 mL/hr over 60 Minutes Intravenous STAT 05/04/22 0331 05/04/22 0545        V. Leia Alf, M.D., [facility] [place] Office: [phone] Pager:  [phone]

## 2022-05-06 NOTE — Progress Notes (Signed)
  Subjective No acute events. Up in chair, abdomen sore. Some nausea but better with NG  Objective: Vital signs in last 24 hours: Temp:  [97.9 F (36.6 C)-99.4 F (37.4 C)] 98.8 F (37.1 C) (11/26 0850) Pulse Rate:  [107-125] 118 (11/26 0800) Resp:  [18-36] 25 (11/26 0815) BP: (96-159)/(45-87) 124/87 (11/26 0800) SpO2:  [82 %-100 %] 100 % (11/26 0815) Arterial Line BP: (148-161)/(55-60) 157/58 (11/25 1400) Weight:  [93.9 kg] 93.9 kg (11/26 0700) Last BM Date :  (PTA)  Intake/Output from previous day: 11/25 0701 - 11/26 0700 In: 1653.1 [I.V.:1457.6; NG/GT:60; IV Piggyback:135.5] Out: 800 [Urine:800] Intake/Output this shift: Total I/O In: 60 [NG/GT:60] Out: -   Gen: NAD, comfortable CV: RRR Pulm: Normal work of breathing Abd: Soft, tender, mildly distended Ext: SCDs in place  Lab Results: CBC  Recent Labs    05/04/22 1702 05/06/22 0239  WBC 19.6* 14.6*  HGB 12.7 10.0*  HCT 35.9* 28.9*  PLT 174 133*   BMET Recent Labs    05/04/22 1702 05/06/22 0239  NA 140 140  K 4.5 4.0  CL 109 109  CO2 22 23  GLUCOSE 171* 107*  BUN 13 9  CREATININE 1.07* 0.85  CALCIUM 8.2* 8.5*   PT/INR No results for input(s): "LABPROT", "INR" in the last 72 hours. ABG Recent Labs    05/04/22 0812 05/04/22 1208  PHART 7.094* 7.330*  HCO3 24.3 20.9    Studies/Results:  Anti-infectives: Anti-infectives (From admission, onward)    Start     Dose/Rate Route Frequency Ordered Stop   05/06/22 1400  piperacillin-tazobactam (ZOSYN) IVPB 3.375 g        3.375 g 12.5 mL/hr over 240 Minutes Intravenous Every 8 hours 05/06/22 0931     05/04/22 1830  piperacillin-tazobactam (ZOSYN) IVPB 3.375 g  Status:  Discontinued        3.375 g 12.5 mL/hr over 240 Minutes Intravenous Every 8 hours 05/04/22 1818 05/06/22 0931   05/04/22 0430  ciprofloxacin (CIPRO) IVPB 400 mg        400 mg 200 mL/hr over 60 Minutes Intravenous STAT 05/04/22 0331 05/04/22 0438   05/04/22 0430  metroNIDAZOLE  (FLAGYL) IVPB 500 mg        500 mg 100 mL/hr over 60 Minutes Intravenous STAT 05/04/22 0331 05/04/22 0545        Assessment/Plan: Patient Active Problem List   Diagnosis Date Noted   S/P laparotomy 05/04/2022   Observation after surgery 05/03/2022   Acute mesenteric ischemia ([place]) 05/03/2022   Flu-like symptoms 06/17/2018   Cough 06/17/2018   Lower respiratory infection 06/17/2018   Chest pain, rule out acute myocardial infarction 12/20/2017   CAD (coronary artery disease) 12/20/2017   Lung nodule, multiple 07/04/2017   Post PTCA 07/04/2017   Abnormal EKG    Acute chest pain    Femoral artery hematoma complicating cardiac catheterization 07/02/2017   Hyperlipidemia with target LDL less than 70 07/02/2017   Tobacco abuse 07/02/2017   NSTEMI (non-ST elevated myocardial infarction) [facility])    Knee LCL sprain 03/20/2017   Rectal bleeding 10/18/2016   Special screening for malignant neoplasms, colon 10/18/2016   Vitamin D deficiency 04/16/2015   s/p Procedure(s): LAPAROSCOPY DIAGNOSTIC EXPLORATORY LAPAROTOMY 05/04/2022  -Ileus not unexpected in setting of hemoperitoneum noted at last operation -NG to low intermittent wall suction -NPO/MIVF   LOS: 3 days   Nadeen Landau, MD [facility], [place]

## 2022-05-07 ENCOUNTER — Inpatient Hospital Stay (HOSPITAL_COMMUNITY): Payer: Commercial Managed Care - PPO

## 2022-05-07 LAB — BASIC METABOLIC PANEL
Anion gap: 10 (ref 5–15)
BUN: 9 mg/dL (ref 6–20)
CO2: 23 mmol/L (ref 22–32)
Calcium: 8.6 mg/dL — ABNORMAL LOW (ref 8.9–10.3)
Chloride: 106 mmol/L (ref 98–111)
Creatinine, Ser: 0.84 mg/dL (ref 0.44–1.00)
GFR, Estimated: >60 mL/min (ref >60)
Glucose, Bld: 102 mg/dL — ABNORMAL HIGH (ref 70–99)
Potassium: 3.8 mmol/L (ref 3.5–5.1)
Sodium: 139 mmol/L (ref 135–145)

## 2022-05-07 LAB — CBC
HCT: 26.8 % — ABNORMAL LOW (ref 36.0–46.0)
Hemoglobin: 9.1 g/dL — ABNORMAL LOW (ref 12.0–15.0)
MCH: 28.1 pg (ref 26.0–34.0)
MCHC: 34 g/dL (ref 30.0–36.0)
MCV: 82.7 fL (ref 80.0–100.0)
Platelets: 149 10*3/uL — ABNORMAL LOW (ref 150–400)
RBC: 3.24 MIL/uL — ABNORMAL LOW (ref 3.87–5.11)
RDW: 16.3 % — ABNORMAL HIGH (ref 11.5–15.5)
WBC: 11.5 10*3/uL — ABNORMAL HIGH (ref 4.0–10.5)
nRBC: 0.3 % — ABNORMAL HIGH (ref 0.0–0.2)

## 2022-05-07 LAB — GLUCOSE, CAPILLARY
Glucose-Capillary: 101 mg/dL — ABNORMAL HIGH (ref 70–99)
Glucose-Capillary: 101 mg/dL — ABNORMAL HIGH (ref 70–99)
Glucose-Capillary: 98 mg/dL (ref 70–99)
Glucose-Capillary: 82 mg/dL (ref 70–99)
Glucose-Capillary: 86 mg/dL (ref 70–99)

## 2022-05-07 NOTE — Evaluation (Signed)
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 Pt underwent mechanical thrombectomy with SMA stent. Pt then with c/o increasing abdominal pain, increasing lactate levels, & pt underwent laparotomy which showed viable bowel, no signs of necrosis but findings of blood in peritoneal cavity with no extravasation. In PACU pt received fentanyl with decreased responsiveness & change in mental status, requiring BiPAP. PMH: CAD s/p stent, HLD, sarcoidosis, NTSEMI, anemia  Clinical Impression  Pt seen for PT evaluation with pt's boyfriend [name]) present for session. 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 On this date, pt requires min assist overall for bed mobility with PT providing max cuing/education re: log rolling to decrease abdominal pain with use of hospital bed features, min assist for STS & step pivot, and to take a few steps in room. Pt demonstrates decreased global strengthening, decreased activity tolerance, & decreased balance. Pt would benefit from CIR upon d/c from acute setting to maximize independence and help facilitate return to independent PLOF. Will continue to follow pt acutely to address deficits noted.   SpO2 dropped to 89% with mobility but able to recover to >/= 90% with rest.  BP in LUE sitting at end of session: 162/72 mmHg MAP 98     Recommendations for follow up therapy are one component of a multi-disciplinary discharge planning process, led by the attending physician.  Recommendations may be updated based on patient status, additional functional criteria and insurance authorization.  Follow Up Recommendations Acute inpatient rehab (3hours/day)      Assistance Recommended at Discharge  Intermittent Supervision/Assistance  Patient can return home with the following  A lot of help with walking and/or transfers;A lot of help with bathing/dressing/bathroom;Assistance with cooking/housework;Assist for transportation;Help with stairs or ramp for entrance    Equipment Recommendations Rolling walker (2 wheels)  Recommendations for Other Services  Rehab consult    Functional Status Assessment Patient has had a recent decline in their functional status and demonstrates the ability to make significant improvements in function in a reasonable and predictable amount of time.     Precautions / Restrictions Precautions Precautions: Fall Precaution Comments: log roll for comfort 2/2 abdominal incision Restrictions Weight Bearing Restrictions: No      Mobility  Bed Mobility Overal bed mobility: Needs Assistance Bed Mobility: Rolling, Sidelying to Sit Rolling: Min assist Sidelying to sit: Min assist, HOB elevated       General bed mobility comments: education & cuing re: technique for log rolling to minimize pain abdomen with bed mobility, pt holds to PT's hand to pull trunk upright during sidelying>sitting    Transfers Overall transfer level: Needs assistance Equipment used: None Transfers: Sit to/from Stand, Bed to chair/wheelchair/BSC Sit to Stand: Min assist   Step pivot transfers: Min assist (extra time to complete movement, extra time for weight shifting L<>R to step towards recliner)            Ambulation/Gait Ambulation/Gait assistance: Min assist Gait Distance (Feet):  (3 ft forwards + 3 ft backwards) Assistive device: None Gait Pattern/deviations: Decreased step length - left, Decreased dorsiflexion - right, Decreased dorsiflexion - left, Decreased stride length, Decreased step length - right Gait velocity: decreased     General Gait Details: Pt takes a few steps forward without BUE support  then holds to IV pole when stepping back to recliner, min  assist overall from PT.  Stairs            Wheelchair Mobility    Modified Rankin (Stroke Patients Only)       Balance Overall balance assessment: Needs assistance Sitting-balance support: Feet supported Sitting balance-Leahy Scale: Fair Sitting balance - Comments: supervision static sitting EOB   Standing balance support: During functional activity, No upper extremity supported Standing balance-Leahy Scale: Fair                               Pertinent Vitals/Pain Pain Assessment Pain Assessment: Faces Faces Pain Scale: Hurts little more Pain Location: abdomen Pain Descriptors / Indicators: Guarding, Discomfort (pt holding stomach during gait) Pain Intervention(s): Monitored during session, Repositioned, Limited activity within patient's tolerance    Home Living Family/patient expects to be discharged to:: Private residence Living Arrangements: Spouse/significant other (boyfriend [name]) Available Help at Discharge: Family (boyfriend works at night so can assist during the day) Type of Home: Apartment Home Access: Stairs to enter Entrance Stairs-Rails: Right;Left (wide set) Entrance Stairs-Number of Steps: 2 flights of stairs to access apartment on 2nd level   Home Layout: One level        Prior Function Prior Level of Function : Independent/Modified Independent;Driving;Working/employed             Mobility Comments: Pt was independent, driving, 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       Hand Dominance        Extremity/Trunk Assessment   Upper Extremity Assessment Upper Extremity Assessment: Overall WFL for tasks assessed    Lower Extremity Assessment Lower Extremity Assessment: Generalized weakness       Communication   Communication:  (low volume, pt reports it hurts to talk 2/2 NG tube)  Cognition Arousal/Alertness: Awake/alert Behavior During Therapy: WFL for tasks assessed/performed Overall Cognitive Status: Within Functional Limits  for tasks assessed                                          General Comments General comments (skin integrity, edema, etc.): Educated pt on importance of OOB mobility, use of incentive spirometer, recommendations of f/u therapy.    Exercises     Assessment/Plan    PT Assessment Patient needs continued PT services  PT Problem List Decreased strength;Cardiopulmonary status limiting activity;Pain;Decreased activity tolerance;Decreased balance;Decreased mobility;Decreased safety awareness;Decreased knowledge of use of DME       PT Treatment Interventions DME instruction;Therapeutic exercise;Gait training;Balance training;Neuromuscular re-education;Functional mobility training;Therapeutic activities;Patient/family education;Stair training;Modalities;Manual techniques    PT Goals (Current goals can be found in the Care Plan section)  Acute Rehab PT Goals Patient Stated Goal: get better, go home PT Goal Formulation: With patient Time For Goal Achievement: 05/21/22 Potential to Achieve Goals: Good    Frequency Min 3X/week     Co-evaluation               AM-PAC PT "6 Clicks" Mobility  Outcome Measure Help needed turning from your back to your side while in a flat bed without using bedrails?: A Little Help needed moving from lying on your back to sitting on the side of a flat bed without using bedrails?: A Little Help needed moving to and from a bed to a chair (including a wheelchair)?: A Little Help  needed standing up from a chair using your arms (e.g., wheelchair or bedside chair)?: A Little Help needed to walk in hospital room?: A Lot Help needed climbing 3-5 steps with a railing? : A Lot 6 Click Score: 16    End of Session Equipment Utilized During Treatment: Oxygen Activity Tolerance: Patient tolerated treatment well;Patient limited by fatigue Patient left: in chair;with chair alarm set;with call bell/phone within reach Nurse Communication: Mobility  status PT Visit Diagnosis: Muscle weakness (generalized) (M62.81);Difficulty in walking, not elsewhere classified (R26.2);Unsteadiness on feet (R26.81)    Time: 0315-9458 PT Time Calculation (min) (ACUTE ONLY): 24 min   Charges:   PT Evaluation $PT Eval High Complexity: 1 High          Lavone Nian, PT, DPT 05/07/22, 10:12 AM   Waunita Schooner 05/07/2022, 10:10 AM

## 2022-05-07 NOTE — Progress Notes (Signed)
Adjusted patient in bed and family at bedside with multiple concerns. Reiterated to family and patient importance of using incentive and flutter valve which patient has been using throughout the day. Patient aware of importance of getting out of bed and working with therapy. PT assisted pt to recliner and OT walked patient to door. Reminded patient that MD expects her to move and get up. Family concerned that "she can not do it by herself". Patient is aware that staff are available to help her ambulate and OOB to chair as needed. Payton Emerald, RN

## 2022-05-07 NOTE — Progress Notes (Signed)
Inpatient Rehab Admissions Coordinator:    I met with pt. And sister to discuss potential CIR admit. Pt. States interest and her sister is able to provide 24/7 support. Pt. Currently with NGT set to suction and is not yet medically ready for CIR; however, I will follow for potential admit pending medical readiness.   Clemens Catholic, [place], [place] Admissions Coordinator  [phone] ([place]) [phone] (office)

## 2022-05-07 NOTE — Progress Notes (Addendum)
Progress Note    05/07/2022 6:51 AM 3 Days Post-Op  Subjective:  says she has not been out of bed.  Has a cough. Has not passed flatus  Afebrile HR 100's-110's  093'G-182'X systolic 93% [id number]  Abx:  Zosyn   Vitals:   05/07/22 0444 05/07/22 0500  BP: (!) 147/77   Pulse: (!) 105   Resp: 18 18  Temp: 98.2 F (36.8 C) 98.2 F (36.8 C)  SpO2:      Physical Exam: General:  no distress Cardiac:  regular but tachy Lungs:  non labored Incisions:  right groin is soft; midline incision with honeycomb dressing Extremities:  bilateral feet are warm Abdomen:  soft; -flatus  CBC    Component Value Date/Time   WBC 11.5 (H) 05/07/2022 0109   RBC 3.24 (L) 05/07/2022 0109   HGB 9.1 (L) 05/07/2022 0109   HCT 26.8 (L) 05/07/2022 0109   PLT 149 (L) 05/07/2022 0109   MCV 82.7 05/07/2022 0109   MCV 89.5 07/22/2018 1550   MCH 28.1 05/07/2022 0109   MCHC 34.0 05/07/2022 0109   RDW 16.3 (H) 05/07/2022 0109   LYMPHSABS 2.0 05/03/2022 1957   MONOABS 0.7 05/03/2022 1957   EOSABS 0.0 05/03/2022 1957   BASOSABS 0.0 05/03/2022 1957    BMET    Component Value Date/Time   NA 140 05/06/2022 0239   NA 141 04/12/2022 1025   K 4.0 05/06/2022 0239   CL 109 05/06/2022 0239   CO2 23 05/06/2022 0239   GLUCOSE 107 (H) 05/06/2022 0239   BUN 9 05/06/2022 0239   BUN 8 04/12/2022 1025   CREATININE 0.85 05/06/2022 0239   CREATININE 0.80 03/13/2015 1628   CALCIUM 8.5 (L) 05/06/2022 0239   GFRNONAA >60 05/06/2022 0239   GFRAA >60 09/09/2018 1659    INR No results found for: "INR"   Intake/Output Summary (Last 24 hours) at 05/07/2022 0651 Last data filed at 05/07/2022 8938 Gross per 24 hour  Intake 1354.31 ml  Output 1575 ml  Net -220.69 ml    NGT output:  100cc/24hr (100cc last shift)   Assessment/Plan:  56 y.o. female is s/p:  Angiogram with angioplasty and stent placement proximal SMA 4 Days Post-Op And  Exploratory laparotomy (general surgery) 3 Days Post-Op   -pt  not passing flatus-keep NGT.  May benefit from dulcolax supp. -continue IS every hour-needs to be oob to chair tid at meal times. -continue abx-leukocytosis improving -DVT prophylaxis:  sq heparin  -getting  plavix/statin per tube   Leontine Locket, PA-C [facility] [phone] 05/07/2022 6:51 AM  I have seen and evaluated the patient. I agree with the PA note as documented above.  Postop day 4 status post percutaneous thrombectomy of acute SMA thrombus with stenting for residual chronic disease.  She required ex lap given increasing abdominal pain and found to have about 1 L of blood in her peritoneum with viable bowel.  White count is improving today and now 11 from 14.  Hgb 9.1.  She has an NG tube that has had no significant output.  States she is not passing gas.  Will Check a KUB this morning.  Continue NG tube to intermittent low wall suction.  Have ordered PT OT.  She can get out of bed.  Discussed she really needs to get motivated.  Has very poor pulmonary toilet.  Zosyn will be continued that was started in the ICU.  Aspirin Plavix down the NG tube for her SMA stent.  Will  hold on restarting heparin given Hbg trend last 3 days 12.7 --> 10 --> 9.1.  Marty Heck, MD [facility] [place] Office: [phone]

## 2022-05-07 NOTE — Progress Notes (Signed)
Removed dressing from right neck. Family and patient concerned that a "piece of something" was left in there. Family concerned that it had been "accidentally pulled out in ICU". Removed dressing with adhesive remover and found small area that appeared to be an opened blister approximately 1x1cm. Applied small dressing to area and explained to patient that area was raw from where tape had been. Family in room and aware. Payton Emerald, RN

## 2022-05-07 NOTE — Progress Notes (Signed)
? ?  Inpatient Rehab Admissions Coordinator : ? ?Per therapy recommendations, patient was screened for CIR candidacy by Nazaret Chea RN MSN.  At this time patient appears to be a potential candidate for CIR. I will place a rehab consult per protocol for full assessment. Please call me with any questions. ? ?Eliyah Bazzi RN MSN ?Admissions Coordinator ?[phone] ?  ?

## 2022-05-07 NOTE — Evaluation (Signed)
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 Pt underwent mechanical thrombectomy with SMA stent. Pt then with c/o increasing abdominal pain, increasing lactate levels, & pt underwent laparotomy which showed viable bowel, no signs of necrosis but findings of blood in peritoneal cavity with no extravasation. In PACU pt received fentanyl with decreased responsiveness & change in mental status, requiring BiPAP. PMH: CAD s/p stent, HLD, sarcoidosis, NTSEMI, anemia   Clinical Impression   Pt in recliner upon therapy arrival with significant other visiting in room. Pt reports pain in her abdomen and with NG tube asking when the tube is able to come out. Pt is currently presenting with decreased strength, endurance, activity tolerance and increased pain since surgical procedures requiring increased physical assistance to complete BADL tasks. Recommend CIR at discharge to address mentioned deficits. OT will follow patient acutely.       Recommendations for follow up therapy are one component of a multi-disciplinary discharge planning process, led by the attending physician.  Recommendations may be updated based on patient status, additional functional criteria and insurance authorization.   Follow Up Recommendations  Acute inpatient rehab (3hours/day)     Assistance Recommended at Discharge Intermittent Supervision/Assistance  Patient can return home with the following A lot of help with walking and/or transfers;A lot of help with bathing/dressing/bathroom;Help with stairs or ramp for entrance;Assist for transportation;Assistance with cooking/housework    Functional Status Assessment  Patient has had a recent decline in their functional status and  demonstrates the ability to make significant improvements in function in a reasonable and predictable amount of time.  Equipment Recommendations  Other (comment) (Defer to next venue of care)    Recommendations for Other Services Rehab consult     Precautions / Restrictions Precautions Precautions: Fall Precaution Comments: log roll for comfort 2/2 abdominal incision, NG tube Restrictions Weight Bearing Restrictions: No      Mobility Bed Mobility Overal bed mobility:  (Pt in chair upon therapy arrival)     Patient Response: Cooperative, Flat affect  Transfers Overall transfer level: Needs assistance (Per PT"s evaluation) Equipment used: None Transfers: Sit to/from Stand, Bed to chair/wheelchair/BSC Sit to Stand: Min assist     Step pivot transfers: Min assist            Balance Overall balance assessment: Mild deficits observed, not formally tested       ADL either performed or assessed with clinical judgement   ADL Overall ADL's : Needs assistance/impaired Eating/Feeding: NPO   Grooming: Wash/dry face;Wash/dry hands;Minimal assistance;Sitting   Upper Body Bathing: Moderate assistance;Sitting   Lower Body Bathing: Moderate assistance;Sit to/from stand   Upper Body Dressing : Moderate assistance;Sitting   Lower Body Dressing: Moderate assistance;Sit to/from stand   Toilet Transfer: Minimal assistance;Stand-pivot   Toileting- Clothing Manipulation and Hygiene: Minimal assistance;Sit to/from stand               Vision Baseline Vision/History: 0 No visual deficits Ability to See in Adequate Light: 0 Adequate Patient Visual Report: No change from baseline Vision Assessment?: No apparent visual deficits            Pertinent Vitals/Pain Pain Assessment Pain Assessment: 0-10 Pain Score: 10-Worst pain ever (5/10 abdomen) Pain Location: NG tube Pain Descriptors / Indicators: Guarding, Discomfort, Grimacing, Moaning Pain Intervention(s): Limited  activity  within patient's tolerance, Monitored during session     Hand Dominance Right   Extremity/Trunk Assessment Upper Extremity Assessment Upper Extremity Assessment: RUE deficits/detail;LUE deficits/detail RUE Deficits / Details: A/ROM WNL in all ranges. MMT: 4-/5 shoulder, elbow in all ranges. Decreased gross grasp LUE Deficits / Details: A/ROM WNL in all ranges. MMT: 4-/5 shoulder, elbow in all ranges. Decreased gross grasp   Lower Extremity Assessment Lower Extremity Assessment: Defer to PT evaluation   Cervical / Trunk Assessment Cervical / Trunk Assessment: Normal   Communication Communication Communication: Other (comment) (Low volume due to NG tube hurting throat and mouth)   Cognition Arousal/Alertness: Awake/alert Behavior During Therapy: WFL for tasks assessed/performed Overall Cognitive Status: Within Functional Limits for tasks assessed                Home Living Family/patient expects to be discharged to:: Private residence Living Arrangements: Spouse/significant other (boyfriendL [name]) Available Help at Discharge: Family;Other (Comment) (Boyfriend works nights and can assist during the day.) Type of Home: Apartment Home Access: Stairs to enter [employer] of Steps: 2 flights of stairs to access apartment on 2nd level Entrance Stairs-Rails: Right;Left (wide set) Home Layout: One level     Bathroom Shower/Tub: Tub/shower unit         Home Equipment: None          Prior Functioning/Environment Prior Level of Function : Independent/Modified Independent;Driving;Working/employed       Mobility Comments: Pt was independent, driving, 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          OT Problem List: Decreased strength;Decreased activity tolerance;Impaired balance (sitting and/or standing);Pain      OT Treatment/Interventions: Self-care/ADL training;Balance training;Therapeutic exercise;Neuromuscular education;Therapeutic activities;Energy  conservation;DME and/or AE instruction;Manual therapy;Patient/family education    OT Goals(Current goals can be found in the care plan section) Acute Rehab OT Goals Patient Stated Goal: to drink water OT Goal Formulation: With patient Time For Goal Achievement: 05/21/22 Potential to Achieve Goals: Good  OT Frequency: Min 2X/week       AM-PAC OT "6 Clicks" Daily Activity     Outcome Measure Help from another person eating meals?: Total Help from another person taking care of personal grooming?: A Little Help from another person toileting, which includes using toliet, bedpan, or urinal?: A Lot Help from another person bathing (including washing, rinsing, drying)?: A Lot Help from another person to put on and taking off regular upper body clothing?: A Lot Help from another person to put on and taking off regular lower body clothing?: A Lot 6 Click Score: 12   End of Session Equipment Utilized During Treatment: Oxygen  Activity Tolerance: Patient limited by pain;Patient tolerated treatment well;No increased pain Patient left: in chair;with call bell/phone within reach;with family/visitor present  OT Visit Diagnosis: Muscle weakness (generalized) (M62.81)                Time: 3235-5732 OT Time Calculation (min): 14 min Charges:  OT General Charges $OT Visit: 1 Visit OT Evaluation $OT Eval Moderate Complexity: 1 Mod  [employer], OTR/L,CBIS  Supplemental OT - [facility] and [facility]   Grayson Pfefferle, Clarene Duke 05/07/2022, 1:11 PM

## 2022-05-08 LAB — GLUCOSE, CAPILLARY
Glucose-Capillary: 101 mg/dL — ABNORMAL HIGH (ref 70–99)
Glucose-Capillary: 102 mg/dL — ABNORMAL HIGH (ref 70–99)
Glucose-Capillary: 110 mg/dL — ABNORMAL HIGH (ref 70–99)
Glucose-Capillary: 110 mg/dL — ABNORMAL HIGH (ref 70–99)
Glucose-Capillary: 113 mg/dL — ABNORMAL HIGH (ref 70–99)
Glucose-Capillary: 95 mg/dL (ref 70–99)

## 2022-05-08 LAB — CBC
HCT: 27.4 % — ABNORMAL LOW (ref 36.0–46.0)
Hemoglobin: 9.3 g/dL — ABNORMAL LOW (ref 12.0–15.0)
MCH: 28.4 pg (ref 26.0–34.0)
MCHC: 33.9 g/dL (ref 30.0–36.0)
MCV: 83.8 fL (ref 80.0–100.0)
Platelets: 187 10*3/uL (ref 150–400)
RBC: 3.27 MIL/uL — ABNORMAL LOW (ref 3.87–5.11)
RDW: 15.5 % (ref 11.5–15.5)
WBC: 11.3 10*3/uL — ABNORMAL HIGH (ref 4.0–10.5)
nRBC: 0.2 % (ref 0.0–0.2)

## 2022-05-08 MED ORDER — AMLODIPINE BESYLATE 10 MG PO TABS
10.0000 mg | ORAL_TABLET | Freq: Every day | ORAL | Status: DC
  Administered 2022-05-08 – 2022-05-16 (×9): 10 mg via ORAL
  Filled 2022-05-08 (×9): qty 1

## 2022-05-08 NOTE — Progress Notes (Signed)
Mobility Specialist Progress Note:   05/08/22 1023  Mobility  Activity Ambulated with assistance in hallway  Level of Assistance Standby assist, set-up cues, supervision of patient - no hands on  Assistive Device Front wheel walker  Distance Ambulated (ft) 60 ft  Activity Response Tolerated well  $Mobility charge 1 Mobility   Pt received in chair willing to participate in mobility. Complaints of abdominal pain. Left in chair with call bell in reach and all needs met.   Gareth Eagle Kaedon Fanelli Mobility Specialist Please contact via [employer] or  Rehab Office at [phone]

## 2022-05-08 NOTE — Progress Notes (Signed)
Inpatient Rehab Admissions Coordinator:   Cir following for potential admit once medically ready. Currently NPO and note medically stable for [place].  Clemens Catholic, [place], [place] Admissions Coordinator  [phone] ([place]) [phone] (office)

## 2022-05-08 NOTE — Progress Notes (Signed)
[facility]  Subjective  - passed gas.   Objective (!) 153/73 92 98.7 F (37.1 C) (Oral) 20 95%  Intake/Output Summary (Last 24 hours) at 05/08/2022 0639 Last data filed at 05/08/2022 0559 Gross per 24 hour  Intake 1682.62 ml  Output 2240 ml  Net -557.38 ml     Midline abdominal incision closed with appropriate postop incisional tenderness Bilateral DP pulses palpable  Laboratory Lab Results: Recent Labs    05/07/22 0109 05/08/22 0126  WBC 11.5* 11.3*  HGB 9.1* 9.3*  HCT 26.8* 27.4*  PLT 149* 187   BMET Recent Labs    05/06/22 0239 05/07/22 0109  NA 140 139  K 4.0 3.8  CL 109 106  CO2 23 23  GLUCOSE 107* 102*  BUN 9 9  CREATININE 0.85 0.84  CALCIUM 8.5* 8.6*    COAG No results found for: "INR", "PROTIME" No results found for: "PTT"  Assessment/Planning:  56 year old female now postop day 5 status post percutaneous thrombectomy of acute SMA thrombus with stenting for residual chronic disease in setting of acute mesenteric ischemia.  She required ex lap giving increasing abdominal pain and found to have blood in her peritoneum with viable bowel.  No significant output from the NG.  KUB yesterday reassuring with no evidence of ileus.  Now passing gas.  Will DC NG tube.  Out of bed and work with therapy.  Appreciate PT/OT input.  Continue aspirin Plavix for SMA stent.  White blood cell count continues to decrease and now 11.3.  Hemoglobin stable at 9.3.  Will hold on heparin full dose.  Continue Zosyn that was started in the ICU after ex lap.  Remains on fentanyl PCA.  She feels she needs one more day with the pCA we will try and DC tomorrow.  Marty Heck 05/08/2022 6:39 AM --

## 2022-05-08 NOTE — PMR Pre-admission (Shared)
PMR Admission Coordinator Pre-Admission Assessment  Patient: [name] is an 56 y.o., female MRN: [id number] DOB: [birth year] Height: _0  (162.6 cm) Weight: 93.9 kg  Insurance Information HMO:     PPO: yes     PCP:      IPA:      80/20:      OTHER:  PRIMARY: [employer]       Policy#: [id number]       Subscriber: patient CM Name: ***      Phone#: ***     Fax#: [phone] Pre-Cert#: ***      Employer:  Benefits:  Phone #: [email or web address]     Name:  Eff. Date: 06/11/21     Deduct: $500 ($500 met)      Out of Pocket Max: $9,100 ($2,401.37 met)      Life Max: NA CIR: 80% coverage, 20% co-insurance      SNF: 80% coverage, 20% co-insurance Outpatient: $30 co-pay/visit     Co-Pay:  Home Health: 80% coverage      Co-Pay: 20% co-insurance DME: 80% coverage     Co-Pay: 20% co-insurance Providers: in-network SECONDARY:       Policy#:      Phone#:   Development worker, community:       Phone#:   The Engineer, petroleum" for patients in Inpatient Rehabilitation Facilities with attached "Privacy Act [place] Records" was provided and verbally reviewed with: n/a  Emergency Contact Information Contact Information     Name Relation Home Work Mobile   Gooch,Queen Daughter   [phone]   Kandis Nab   [phone]   Brunilda Payor   [phone]       Current Medical History  Patient Admitting Diagnosis: Mesenteric ischemia  History of Present Illness: Pt.  is a 56 y.o. female with history of coronary artery disease status post NSTEMI in 2019 and previous tobacco abuse for 30 years that vascular surgery has been consulted for SMA thrombus.  Patient presented to [facility] ED 05/03/22 with 2 days of generalized abdominal pain.  She had associated nausea. Transferred to [facility] to be seen by Vascular service. CTA at [facility] showed thrombus in the upper abdominal aorta extending into the superior mesenteric artery. She required ex lap given increasing  abdominal pain and found to have blood in her peritoneum with viable bowel. In PACU patient received 50 mics of fentanyl, with decreased responsiveness and change in mental status, requiring BiPAP. She was transferred to ICU. Pt. Seen by PT/OT post-op and they recommend CIR to assist return to PLOF.     Patient's medical record from [facility]  has been reviewed by the rehabilitation admission coordinator and physician.  Past Medical History  Past Medical History:  Diagnosis Date   Anemia    Fibroid    Hyperlipidemia    NSTEMI (non-ST elevated myocardial infarction) ([place]) 06/2017   s/p DES to LCX 07/02/17   Sarcoidosis     Has the patient had major surgery during 100 days prior to admission? Yes  Family History   family history includes Breast cancer in her paternal grandmother; Diabetes in her mother; Hyperlipidemia in her father; Hypertension in her father and mother; Stroke (age of onset: 29) in her mother; Stroke (age of onset: 61) in her father.  Current Medications  Current Facility-Administered Medications:    0.9 %  sodium chloride infusion, 250 mL, Intravenous, PRN, Ralene Ok, MD   acetaminophen (TYLENOL) tablet 650 mg, 650 mg, Per Tube,  Q4H PRN, Donnamae Jude, RPH   albuterol (PROVENTIL) (2.5 MG/3ML) 0.083% nebulizer solution 2.5 mg, 2.5 mg, Nebulization, Q6H PRN, Ralene Ok, MD   alum & mag hydroxide-simeth (MAALOX/MYLANTA) 200-200-20 MG/5ML suspension 30 mL, 30 mL, Per Tube, Q2H PRN, Donnamae Jude, RPH   aspirin chewable tablet 81 mg, 81 mg, Per Tube, Daily, Donnamae Jude, RPH, 81 mg at 05/08/22 0827   atorvastatin (LIPITOR) tablet 20 mg, 20 mg, Per Tube, Daily, Donnamae Jude, RPH, 20 mg at 05/08/22 8338   Chlorhexidine Gluconate Cloth 2 % PADS 6 each, 6 each, Topical, Daily, Marty Heck, MD, 6 each at 05/07/22 1000   cloNIDine (CATAPRES - Dosed in mg/24 hr) patch 0.2 mg, 0.2 mg, Transdermal, Q Sat, Chand, Currie Paris, MD, 0.2 mg at  05/05/22 1022   [COMPLETED] clopidogrel (PLAVIX) tablet 300 mg, 300 mg, Oral, Once, 300 mg at 05/03/22 1540 **FOLLOWED BY** clopidogrel (PLAVIX) tablet 75 mg, 75 mg, Per Tube, Q breakfast, Donnamae Jude, RPH, 75 mg at 05/08/22 0827   dextrose 5 % in lactated ringers infusion, , Intravenous, Continuous, Jacky Kindle, MD, Last Rate: 75 mL/hr at 05/08/22 0423, Infusion Verify at 05/08/22 0423   diphenhydrAMINE (BENADRYL) injection 12.5 mg, 12.5 mg, Intravenous, Q6H PRN **OR** diphenhydrAMINE (BENADRYL) 12.5 MG/5ML elixir 12.5 mg, 12.5 mg, Per Tube, Q6H PRN, Benetta Spar D, RPH   fentaNYL 50 mcg/mL PCA injection, , Intravenous, Q4H, Chand, Currie Paris, MD, 10 mcg at 05/08/22 0830   guaiFENesin (ROBITUSSIN) 100 MG/5ML liquid 5 mL, 5 mL, Per Tube, Q4H PRN, Marty Heck, MD, 5 mL at 05/07/22 0409   heparin injection 5,000 Units, 5,000 Units, Subcutaneous, Q8H, Serafina Mitchell, MD, 5,000 Units at 05/08/22 0535   insulin aspart (novoLOG) injection 1-3 Units, 1-3 Units, Subcutaneous, Q4H, Ogan, Kerry Kass, MD, 1 Units at 05/05/22 1957   labetalol (NORMODYNE) injection 10 mg, 10 mg, Intravenous, Q2H PRN, Jacky Kindle, MD, 10 mg at 05/05/22 1121   naloxone (NARCAN) injection 0.4 mg, 0.4 mg, Intravenous, PRN **AND** sodium chloride flush (NS) 0.9 % injection 9 mL, 9 mL, Intravenous, PRN, Jacky Kindle, MD   ondansetron (ZOFRAN) injection 4 mg, 4 mg, Intravenous, Q6H PRN, Ralene Ok, MD, 4 mg at 05/04/22 1139   oxyCODONE (Oxy IR/ROXICODONE) immediate release tablet 5-10 mg, 5-10 mg, Per Tube, Q4H PRN, Benetta Spar D, RPH   pantoprazole (PROTONIX) injection 40 mg, 40 mg, Intravenous, Q12H, Ralene Ok, MD, 40 mg at 05/08/22 0827   piperacillin-tazobactam (ZOSYN) IVPB 3.375 g, 3.375 g, Intravenous, Q8H, Wise, Nason S, RPH, Last Rate: 12.5 mL/hr at 05/08/22 0559, 3.375 g at 05/08/22 0559   promethazine (PHENERGAN) 12.5 mg in sodium chloride 0.9 % 50 mL IVPB, 12.5 mg, Intravenous, Q6H PRN, Ralene Ok, MD, Stopped at 05/04/22 1348   sodium chloride flush (NS) 0.9 % injection 3 mL, 3 mL, Intravenous, Q12H, Ralene Ok, MD, 3 mL at 05/08/22 2505   sodium chloride flush (NS) 0.9 % injection 3 mL, 3 mL, Intravenous, PRN, Ralene Ok, MD  Patients Current Diet:  Diet Order             Diet NPO time specified Except for: Ice Chips, Sips with Meds  Diet effective now                   Precautions / Restrictions Precautions Precautions: Fall Precaution Comments: log roll for comfort 2/2 abdominal incision, NG tube Restrictions Weight Bearing Restrictions: No   Has the patient had 2 or  more falls or a fall with injury in the past year? No  Prior Activity Level Community (5-7x/wk): Pt. active in the community PTA  Prior Functional Level Self Care: Did the patient need help bathing, dressing, using the toilet or eating? Independent  Indoor Mobility: Did the patient need assistance with walking from room to room (with or without device)? Independent  Stairs: Did the patient need assistance with internal or external stairs (with or without device)? Independent  Functional Cognition: Did the patient need help planning regular tasks such as shopping or remembering to take medications? Independent  Patient Information Are you of Hispanic, Latino/a,or Spanish origin?: A. No, not of Hispanic, Latino/a, or Spanish origin What is your race?: B. Black or African American Do you need or want an interpreter to communicate with a doctor or health care staff?: 0. No  Patient's Response To:  Health Literacy and Transportation Is the patient able to respond to health literacy and transportation needs?: Yes Health Literacy - How often do you need to have someone help you when you read instructions, pamphlets, or other written material from your doctor or pharmacy?: Never In the past 12 months, has lack of transportation kept you from medical appointments or from getting  medications?: No In the past 12 months, has lack of transportation kept you from meetings, work, or from getting things needed for daily living?: No  Development worker, international aid / [place] Devices/Equipment: None Home Equipment: None  Prior Device Use: Indicate devices/aids used by the patient prior to current illness, exacerbation or injury? None of the above  Current Functional Level Cognition  Overall Cognitive Status: Within Functional Limits for tasks assessed Orientation Level: Oriented X4    Extremity Assessment (includes Sensation/Coordination)  Upper Extremity Assessment: RUE deficits/detail, LUE deficits/detail RUE Deficits / Details: A/ROM WNL in all ranges. MMT: 4-/5 shoulder, elbow in all ranges. Decreased gross grasp LUE Deficits / Details: A/ROM WNL in all ranges. MMT: 4-/5 shoulder, elbow in all ranges. Decreased gross grasp  Lower Extremity Assessment: Defer to PT evaluation    ADLs  Overall ADL's : Needs assistance/impaired Eating/Feeding: NPO Grooming: Wash/dry face, Wash/dry hands, Minimal assistance, Sitting Upper Body Bathing: Moderate assistance, Sitting Lower Body Bathing: Moderate assistance, Sit to/from stand Upper Body Dressing : Moderate assistance, Sitting Lower Body Dressing: Moderate assistance, Sit to/from stand Toilet Transfer: Minimal assistance, Stand-pivot Toileting- Clothing Manipulation and Hygiene: Minimal assistance, Sit to/from stand    Mobility  Overal bed mobility:  (Pt in chair upon therapy arrival) Bed Mobility: Rolling, Sidelying to Sit Rolling: Min assist Sidelying to sit: Min assist, HOB elevated General bed mobility comments: education & cuing re: technique for log rolling to minimize pain abdomen with bed mobility, pt holds to PT's hand to pull trunk upright during sidelying>sitting    Transfers  Overall transfer level: Needs assistance (Per PT"s evaluation) Equipment used: None Transfers: Sit to/from Stand, Bed to  chair/wheelchair/BSC Sit to Stand: Min assist Bed to/from chair/wheelchair/BSC transfer type:: Step pivot Step pivot transfers: Min assist    Ambulation / Gait / Stairs / Wheelchair Mobility  Ambulation/Gait Ambulation/Gait assistance: Herbalist (Feet):  (3 ft forwards + 3 ft backwards) Assistive device: None Gait Pattern/deviations: Decreased step length - left, Decreased dorsiflexion - right, Decreased dorsiflexion - left, Decreased stride length, Decreased step length - right General Gait Details: Pt takes a few steps forward without BUE support then holds to IV pole when stepping back to recliner, min assist overall from PT. Gait  velocity: decreased    Posture / Balance Dynamic Sitting Balance Sitting balance - Comments: supervision static sitting EOB Balance Overall balance assessment: Mild deficits observed, not formally tested Sitting-balance support: Feet supported Sitting balance-Leahy Scale: Fair Sitting balance - Comments: supervision static sitting EOB Standing balance support: During functional activity, No upper extremity supported Standing balance-Leahy Scale: Fair    Special needs/care consideration Skin surgical incision and Special service needs ***   Previous Home Environment (from acute therapy documentation) Living Arrangements: Spouse/significant other (boyfriendL Paul)  Lives With: Spouse Available Help at Discharge: Family, Other (Comment) (Boyfriend works nights and can assist during the day.) Type of Home: [place]: One level Home Access: Stairs to enter Entrance Stairs-Rails: Right, Left (wide set) Entrance Stairs-Number of Steps: 2 flights of stairs to access apartment on 2nd level Bathroom Shower/Tub: Chiropodist: Standard Bathroom Accessibility: Yes How Accessible: Accessible via wheelchair, Accessible via [place]: No  Discharge Living Setting Plans for Discharge Living Setting:  Patient's home Type of Home at Discharge: Apartment Discharge Home Layout: One level Discharge Home Access: Stairs to enter Entrance Stairs-Rails: Can reach both, Left, Right Entrance Stairs-Number of Steps: 6+6 Discharge Bathroom Shower/Tub: Tub/shower unit Discharge Bathroom Toilet: Standard Discharge Bathroom Accessibility: Yes How Accessible: Accessible via walker, Accessible via wheelchair Does the patient have any problems obtaining your medications?: No  Social/Family/Support Systems Patient Roles: Other (Comment) Anticipated Caregiver: Sister Ability/Limitations of Caregiver: Min A Caregiver Availability: 24/7 Discharge Plan Discussed with Primary Caregiver: Yes Is Caregiver In Agreement with Plan?: Yes Does Caregiver/Family have Issues with Lodging/Transportation while Pt is in Rehab?: No  Goals Patient/Family Goal for Rehab: PT/OT Mod I Expected length of stay: 8-10 days Pt/Family Agrees to Admission and willing to participate: Yes Program Orientation Provided & Reviewed with Pt/Caregiver Including Roles  & Responsibilities: No  Decrease burden of Care through IP rehab admission:none  Possible need for SNF placement upon discharge: not anticipated  Patient Condition: I have reviewed medical records from [facility] , spoken with CM, and patient and family member. I met with patient at the bedside for inpatient rehabilitation assessment.  Patient will benefit from ongoing PT and OT, can actively participate in 3 hours of therapy a day 5 days of the week, and can make measurable gains during the admission.  Patient will also benefit from the coordinated team approach during an Inpatient Acute Rehabilitation admission.  The patient will receive intensive therapy as well as Rehabilitation physician, nursing, social worker, and care management interventions.  Due to safety, skin/wound care, disease management, medication administration, pain management, and  patient education the patient requires 24 hour a day rehabilitation nursing.  The patient is currently *** with mobility and basic ADLs.  Discharge setting and therapy post discharge at home with home health is anticipated.  Patient has agreed to participate in the Acute Inpatient Rehabilitation Program and will admit {Time; today/tomorrow:10263}.  Preadmission Screen Completed By:  Genella Mech, 05/08/2022 8:49 AM ______________________________________________________________________   Discussed status with Dr. Marland Kitchen on *** at *** and received approval for admission today.  Admission Coordinator:  Genella Mech, CCC-SLP, time Marland KitchenSudie Grumbling ***   Assessment/Plan: Diagnosis: Does the need for close, 24 hr/day Medical supervision in concert with the patient's rehab needs make it unreasonable for this patient to be served in a less intensive setting? {yes_no_potentially:3041433} Co-Morbidities requiring supervision/potential complications: *** Due to {due SN:0539767}, does the patient require 24 hr/day rehab nursing? {yes_no_potentially:3041433} Does the patient require  coordinated care of a physician, rehab nurse, PT, OT, and SLP to address physical and functional deficits in the context of the above medical diagnosis(es)? {yes_no_potentially:3041433} Addressing deficits in the following areas: {deficits:3041436} Can the patient actively participate in an intensive therapy program of at least 3 hrs of therapy 5 days a week? {yes_no_potentially:3041433} The potential for patient to make measurable gains while on inpatient rehab is {potential:3041437} Anticipated functional outcomes upon discharge from inpatient rehab: {functional outcomes:304600100} PT, {functional outcomes:304600100} OT, {functional outcomes:304600100} SLP Estimated rehab length of stay to reach the above functional goals is: *** Anticipated discharge destination: {anticipated dc setting:21604} 10. Overall Rehab/Functional Prognosis:  {potential:3041437}   MD Signature: ***

## 2022-05-08 NOTE — Progress Notes (Signed)
Foley catheter removed per MD order without difficulty.   

## 2022-05-08 NOTE — Progress Notes (Signed)
Central line inadvertently removed upon transitioning from chair to bed. Pt instructed to stand on the count of three, however, she got ahead of herself which caused an accidentally removal of central line. Upon notice of removal, I quickly placed pressure on site and had family member ask a nurse to come in and assist me as I did not want to leave pt unattended at bedside or release pressure. Second nurse made E-link aware. Covered central line site with occlusive dressing, gauze, and tape with very minimal bleeding. Assisted pt back into bed.   Martyn Ehrich, RN

## 2022-05-09 LAB — CBC
HCT: 27.4 % — ABNORMAL LOW (ref 36.0–46.0)
Hemoglobin: 9.3 g/dL — ABNORMAL LOW (ref 12.0–15.0)
MCH: 28 pg (ref 26.0–34.0)
MCHC: 33.9 g/dL (ref 30.0–36.0)
MCV: 82.5 fL (ref 80.0–100.0)
Platelets: 229 10*3/uL (ref 150–400)
RBC: 3.32 MIL/uL — ABNORMAL LOW (ref 3.87–5.11)
RDW: 15.1 % (ref 11.5–15.5)
WBC: 11.1 10*3/uL — ABNORMAL HIGH (ref 4.0–10.5)
nRBC: 0.3 % — ABNORMAL HIGH (ref 0.0–0.2)

## 2022-05-09 LAB — GLUCOSE, CAPILLARY
Glucose-Capillary: 100 mg/dL — ABNORMAL HIGH (ref 70–99)
Glucose-Capillary: 101 mg/dL — ABNORMAL HIGH (ref 70–99)
Glucose-Capillary: 106 mg/dL — ABNORMAL HIGH (ref 70–99)
Glucose-Capillary: 108 mg/dL — ABNORMAL HIGH (ref 70–99)
Glucose-Capillary: 115 mg/dL — ABNORMAL HIGH (ref 70–99)
Glucose-Capillary: 138 mg/dL — ABNORMAL HIGH (ref 70–99)

## 2022-05-09 LAB — TRIGLYCERIDES: Triglycerides: 132 mg/dL (ref <150)

## 2022-05-09 MED ORDER — AMISULPRIDE (ANTIEMETIC) 5 MG/2ML IV SOLN
INTRAVENOUS | Status: AC
  Filled 2022-05-09: qty 4

## 2022-05-09 MED ORDER — MORPHINE SULFATE (PF) 2 MG/ML IV SOLN: 2.0000 mg | INTRAVENOUS | Status: DC | PRN

## 2022-05-09 MED ORDER — OXYCODONE-ACETAMINOPHEN 5-325 MG PO TABS
1.0000 | ORAL_TABLET | ORAL | Status: DC | PRN
  Administered 2022-05-10 – 2022-05-13 (×6): 2 via ORAL
  Filled 2022-05-09 (×6): qty 2

## 2022-05-09 MED FILL — Morphine Sulfate Inj 4 MG/ML: INTRAMUSCULAR | Qty: 1 | Status: AC

## 2022-05-09 NOTE — Progress Notes (Signed)
8 ml of Fentanyl from PCA syringe was wasted in med room B, 4E, with Benjie Karvonen.

## 2022-05-09 NOTE — Progress Notes (Addendum)
[facility] [place] [facility]  Subjective  - doing well over all no N/V   Objective 132/70 84 98.4 F (36.9 C) (Oral) 20 99%  Intake/Output Summary (Last 24 hours) at 05/09/2022 0736 Last data filed at 05/09/2022 0405 Gross per 24 hour  Intake 2257.11 ml  Output 700 ml  Net 1557.11 ml    Abdomin soft NTTP, + flatus, no BM  Incision healing well Feet warm and well perfused Lung non labored breathing   Assessment/Planning: 56 year old female now postop day 5 status post percutaneous thrombectomy of acute SMA thrombus with stenting for residual chronic disease in setting of acute mesenteric ischemia.  She required ex lap giving increasing abdominal pain and found to have blood in her peritoneum with viable bowel.   Tolerated sip and chips without N/V Positive flatus Abdomin is soft Will start clear liquids Continue to mobilize Leukocytosis continues to decrease HGB stable    Roxy Horseman 05/09/2022 7:36 AM --  Laboratory Lab Results: Recent Labs    05/08/22 0126 05/09/22 0542  WBC 11.3* 11.1*  HGB 9.3* 9.3*  HCT 27.4* 27.4*  PLT 187 229   BMET Recent Labs    05/07/22 0109  NA 139  K 3.8  CL 106  CO2 23  GLUCOSE 102*  BUN 9  CREATININE 0.84  CALCIUM 8.6*    COAG No results found for: "INR", "PROTIME" No results found for: "PTT"  I have seen and evaluated the patient. I agree with the PA note as documented above. 56 year old female now postop day 6 status post percutaneous thrombectomy of acute SMA thrombus with stenting for residual chronic disease in setting of acute mesenteric ischemia.  She required ex lap giving increasing abdominal pain and found to have blood in her peritoneum with viable bowel.  Tolerated sips and chips since NG removed.  Passing gas.  Will give CLD.  Continue aspirin Plavix for SMA stent.  White blood cell count continues to decrease.  Hemoglobin stable at 9.3.  Will hold on heparin full dose.   Continue Zosyn that was started in the ICU after ex lap.  Will d/c fentanyl PCA - not using much.  PRN percocet and IV morphine.  PT recommending rehab.  Marty Heck, MD [facility] [place] Office: [phone]

## 2022-05-09 NOTE — Progress Notes (Signed)
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 Pt underwent mechanical thrombectomy with SMA stent. Pt then with c/o increasing abdominal pain, increasing lactate levels, & pt underwent laparotomy which showed viable bowel, no signs of necrosis but findings of blood in peritoneal cavity with no extravasation. In PACU pt received fentanyl with decreased responsiveness & change in mental status, requiring BiPAP. PMH: CAD s/p stent, HLD, sarcoidosis, NTSEMI, anemia    PT Comments    Pt steady progressing with mobility. Today's session focused on transfers and gait. Pt ambulated 65 ft min guard with RW, cues required to increase BOS and improve foot clearance to reduce risk for event during gait. Session limited by abdominal pain and single episode of dizziness. Pt remains limited by generalized weakness, decreased activity tolerance, and impaired balance strategies/postural reactions. Continue to recommend acute PT services to maximize functional mobility and independence prior to d/c to AIR level therapies.  BP 102/82 SpO2 >91% on RA    Recommendations for follow up therapy are one component of a multi-disciplinary discharge planning process, led by the attending physician.  Recommendations may be updated based on patient status, additional functional criteria and insurance authorization.  Follow Up Recommendations  Acute inpatient rehab (3hours/day)     Assistance Recommended at Discharge Intermittent Supervision/Assistance  Patient can return home with the following Assistance with cooking/housework;Assist for transportation;Help with stairs or ramp for entrance;A little help with walking and/or transfers;A little help with bathing/dressing/bathroom    Equipment Recommendations  Rolling walker (2 wheels)    Recommendations for Other Services       Precautions / Restrictions Precautions Precautions: Fall;Other (comment) Precaution Comments: watch BP - pt reported dizziness Restrictions Weight Bearing Restrictions: No     Mobility  Bed Mobility               General bed mobility comments: pt sitting up in recliner upon arrival, able to lean forward and scoot hips back min guard with increased time and cues for abdominal comfort    Transfers Overall transfer level: Needs assistance Equipment used: Rolling walker (2 wheels) Transfers: Sit to/from Stand Sit to Stand: Supervision           General transfer comment: supervision to stand with cues for hand placement although pt declined using that method due to increased abdominal discomfort when pushing from recliner arm rests    Ambulation/Gait Ambulation/Gait assistance: Min guard Gait Distance (Feet): 65 Feet Assistive device: Rolling walker (2 wheels) Gait Pattern/deviations: Step-through pattern, Decreased stride length, Narrow base of support, Decreased dorsiflexion - right, Decreased dorsiflexion - left Gait velocity: decreased     General Gait Details: slow cautious gait with ability to focus on ambulation task with hallway distractions; cues necessary to increase BOS for improved gait mechanics and food clearance   Stairs             Wheelchair Mobility    Modified Rankin (Stroke Patients Only)       Balance Overall balance assessment: Needs assistance Sitting-balance support: Feet supported Sitting balance-Leahy Scale: Fair Sitting balance - Comments: dynamic weight shifting in recliner for clothing mgmt with no LOB   Standing balance support: During functional activity, Bilateral upper extremity supported Standing balance-Leahy Scale: Poor Standing balance comment: pt with increasing reliance of UE support on RW  throughout session                             Cognition Arousal/Alertness: Awake/alert Behavior During Therapy: Flat affect Overall Cognitive Status: Within Functional Limits for tasks assessed                                 General Comments: pt slow to respond to questions althouh suspect due to unpredictable pain challenging mobility at times        Exercises      General Comments General comments (skin integrity, edema, etc.): SpO2 >91% on RA, pt boyfriend present during session. BP after reports of dizziness 102/82.      Pertinent Vitals/Pain Pain Assessment Faces Pain Scale: Hurts little more Pain Location: abdomen - all over Pain Descriptors / Indicators: Discomfort, Guarding, Grimacing Pain Intervention(s): Monitored during session, Other (comment) (splinted cough with pillow)    Home Living                          Prior Function            PT Goals (current goals can now be found in the care plan section) Acute Rehab PT Goals Patient Stated Goal: get better, go home PT Goal Formulation: With patient Time For Goal Achievement: 05/21/22 Potential to Achieve Goals: Good Progress towards PT goals: Progressing toward goals    Frequency    Min 3X/week      PT Plan Current plan remains appropriate    Co-evaluation              AM-PAC PT "6 Clicks" Mobility   Outcome Measure  Help needed turning from your back to your side while in a flat bed without using bedrails?: A Little Help needed moving from lying on your back to sitting on the side of a flat bed without using bedrails?: A Little Help needed moving to and from a bed to a chair (including a wheelchair)?: A Little Help needed standing up from a chair using your arms (e.g., wheelchair or bedside chair)?: A Little Help needed to walk in hospital room?: A Little Help needed climbing 3-5 steps with a railing? : A Little 6 Click Score: 18    End of Session Equipment Utilized During  Treatment: Gait belt Activity Tolerance: Patient tolerated treatment well;Patient limited by fatigue Patient left: in chair;with call bell/phone within reach;with family/visitor present Nurse Communication: Mobility status PT Visit Diagnosis: Muscle weakness (generalized) (M62.81);Difficulty in walking, not elsewhere classified (R26.2);Unsteadiness on feet (R26.81)     Time: 8119-1478 PT Time Calculation (min) (ACUTE ONLY): 23 min  Charges:  $Gait Training: 8-22 mins $Therapeutic Activity: 8-22 mins                     Chipper Oman, SPT    [name] 05/09/2022, 1:51 PM

## 2022-05-10 LAB — CBC
HCT: 28.5 % — ABNORMAL LOW (ref 36.0–46.0)
Hemoglobin: 9.9 g/dL — ABNORMAL LOW (ref 12.0–15.0)
MCH: 28.4 pg (ref 26.0–34.0)
MCHC: 34.7 g/dL (ref 30.0–36.0)
MCV: 81.9 fL (ref 80.0–100.0)
Platelets: 268 10*3/uL (ref 150–400)
RBC: 3.48 MIL/uL — ABNORMAL LOW (ref 3.87–5.11)
RDW: 15 % (ref 11.5–15.5)
WBC: 12.5 10*3/uL — ABNORMAL HIGH (ref 4.0–10.5)
nRBC: 0.2 % (ref 0.0–0.2)

## 2022-05-10 LAB — GLUCOSE, CAPILLARY
Glucose-Capillary: 104 mg/dL — ABNORMAL HIGH (ref 70–99)
Glucose-Capillary: 105 mg/dL — ABNORMAL HIGH (ref 70–99)
Glucose-Capillary: 108 mg/dL — ABNORMAL HIGH (ref 70–99)
Glucose-Capillary: 120 mg/dL — ABNORMAL HIGH (ref 70–99)
Glucose-Capillary: 96 mg/dL (ref 70–99)
Glucose-Capillary: 98 mg/dL (ref 70–99)

## 2022-05-10 MED ORDER — BOOST / RESOURCE BREEZE PO LIQD CUSTOM
1.0000 | Freq: Three times a day (TID) | ORAL | Status: DC
  Administered 2022-05-11 (×2): 1 via ORAL

## 2022-05-10 NOTE — Progress Notes (Signed)
Inpatient Rehab Admissions Coordinator:  Await medical clearance for potential CIR admission. Also await updated clinicals. Will continue to follow.   Gayland Curry, [place], [place] Admissions Coordinator [phone]

## 2022-05-10 NOTE — Progress Notes (Signed)
Mobility Specialist Progress Note:   05/10/22 1219  Mobility  Activity Ambulated with assistance in hallway  Level of Assistance Standby assist, set-up cues, supervision of patient - no hands on  Assistive Device Front wheel walker  Distance Ambulated (ft) 70 ft  Activity Response Tolerated well  $Mobility charge 1 Mobility   Pt received in bed wiling to participate in mobility. Complaints of abdominal pain. Left in chair with call bell in reach and all needs met.   Gareth Eagle Dawnya Grams Mobility Specialist Please contact via [employer] or  Rehab Office at [phone]

## 2022-05-10 NOTE — Progress Notes (Addendum)
[facility]  Subjective  - Feels weak   Objective 139/81 91 98.7 F (37.1 C) (Oral) (!) 23 93%  Intake/Output Summary (Last 24 hours) at 05/10/2022 0730 Last data filed at 05/10/2022 0100 Gross per 24 hour  Intake 360 ml  Output 800 ml  Net -440 ml   Lung non labored breathing  Minimal BS, soft NTTP Feet motor intact, well perufsed   Assessment/Planning: 56 year old female now postop day 5 status post percutaneous thrombectomy of acute SMA thrombus with stenting for residual chronic disease in setting of acute mesenteric ischemia.  She required ex lap giving increasing abdominal pain and found to have blood in her peritoneum with viable bowel.    Tolerated clears without N/V I will continue with clears because she has hypo bowel sounds Positive flatus pe patient Continue to mobilize Leukocytosis continues no 12.5 from 11.1 HGB stable  Roxy Horseman 05/10/2022 7:30 AM --  Laboratory Lab Results: Recent Labs    05/09/22 0542 05/10/22 0219  WBC 11.1* 12.5*  HGB 9.3* 9.9*  HCT 27.4* 28.5*  PLT 229 268   BMET No results for input(s): "NA", "K", "CL", "CO2", "GLUCOSE", "BUN", "CREATININE", "CALCIUM" in the last 72 hours.  COAG No results found for: "INR", "PROTIME" No results found for: "PTT"   I agree with the above.  Passing flatus, no bowel movement.  Will advance diet slowly.  WBC trending down.  Continue ASA/Plavix.  Continue Zosyn  Annamarie Major

## 2022-05-10 NOTE — Progress Notes (Signed)
Initial Nutrition Assessment  DOCUMENTATION CODES:   Obesity unspecified  INTERVENTION:  While pt is on clear liquid diet provide Boost Breeze po TID, each supplement provides 250 kcal and 9 grams of protein. Pt prefers to start supplement tomorrow morning.  Will monitor for diet advancement per Vascular Surgery.  NUTRITION DIAGNOSIS:   Inadequate oral intake related to acute illness (acute mesenteric ischemia s/p percutaneous thrombectomy of acute SMA thrombus with stenting, s/p ex lap with blood in peritoneum) as evidenced by per patient/family report (NPO/clear liquid diet).  GOAL:   Patient will meet greater than or equal to 90% of their needs  MONITOR:   PO intake, Supplement acceptance, Diet advancement, Labs, Weight trends, I & O's  REASON FOR ASSESSMENT:   NPO/Clear Liquid Diet    ASSESSMENT:   56 year old female with PMHx of HLD, NSTEMI s/p DES to LCX 07/02/2017, sarcoidosis, anemia who was admitted with acute mesenteric ischemia s/p percutaneous thrombectomy of acute SMA thrombus with stenting for residual chronic disease on 05/03/22. Underwent diagnostic laparoscopy and exploratory laparotomy 05/04/22 given increasing abdominal pain found to have blood in peritoneum with viable bowel.  11/29: diet advanced to clear liquids  Met with patient at bedside. She had a diet order for 2 days earlier in admission but reports she was not able to tolerate. She would eat small amounts and have emesis immediately afterwards. She is now day 7 of inadequate intake. She reports her appetite was good PTA. She typically eats 2 meals per day. She has fish or shrimp with sides. She denies food allergies or intolerances. Denies difficulty chewing or swallowing at baseline. She reports she does not have any abdominal pain unless she coughs. She denies nausea or emesis and reports she is tolerating clear liquid diet well. She is passing flatus. She has not yet had a bowel movement this  admission. Patient is amenable to drinking Boost Breeze as ONS while on clear liquid diet. She is hopeful diet can be advanced soon so she can have pudding, ice cream, and other foods.  Pt denies any weight loss and reports her weight is stable. She reports UBW is around 200 lbs. Weight in chart appears to be stable. Current wt is 93.9 kg (207.1 lbs). Pt is unsure if this weight is accurate. Could be due to use of different scale after transferring vs fluid status. Will continue to monitor.  UOP: 800 mL UOP + 2 occurrences unmeasured UOP  I/O: +6322 mL since admission  Medications reviewed and include: Novolog 1-3 units every 4 hrs, pantoprazole, D5 in LR at 50 mL/hour, Zosyn  Labs reviewed: CBG 96-120. No recent chem profile in the past 48 hours.  Pt does not meet criteria for malnutrition at this time. She is at risk for development of acute malnutrition.  Discussed with RN. Patient now with bowel sounds.  NUTRITION - FOCUSED PHYSICAL EXAM:  Flowsheet Row Most Recent Value  Orbital Region No depletion  Upper Arm Region No depletion  Thoracic and Lumbar Region No depletion  Buccal Region Mild depletion  Temple Region Mild depletion  Clavicle Bone Region No depletion  Clavicle and Acromion Bone Region No depletion  Scapular Bone Region No depletion  Dorsal Hand No depletion  Patellar Region No depletion  Anterior Thigh Region No depletion  Posterior Calf Region No depletion  Edema (RD Assessment) None  Hair Reviewed  Eyes Reviewed  Mouth Reviewed  Skin Reviewed  Nails Unable to assess  [pt with nail polish on nails]  Diet Order:   Diet Order             Diet clear liquid Room service appropriate? No; Fluid consistency: Thin  Diet effective now                  EDUCATION NEEDS:   No education needs have been identified at this time  Skin:  Skin Assessment: Skin Integrity Issues: Skin Integrity Issues:: Incisions Incisions: closed incisions right groin  and abdomen  Last BM:  PTA  Height:   Ht Readings from Last 1 Encounters:  05/02/22 _0  (1.626 m)   Weight:   Wt Readings from Last 1 Encounters:  05/06/22 93.9 kg   Ideal Body Weight:  54.5 kg  BMI:  Body mass index is 35.55 kg/m.  Estimated Nutritional Needs:   Kcal:  2000-2200  Protein:  100-110 grams  Fluid:  2-2.2 L/day  Loanne Drilling, MS, RD, LDN, CNSC Pager number available on Amion

## 2022-05-10 NOTE — Progress Notes (Signed)
Patient in no respiratory distress. BiPAP not needed at this time.

## 2022-05-11 LAB — GLUCOSE, CAPILLARY
Glucose-Capillary: 103 mg/dL — ABNORMAL HIGH (ref 70–99)
Glucose-Capillary: 105 mg/dL — ABNORMAL HIGH (ref 70–99)
Glucose-Capillary: 111 mg/dL — ABNORMAL HIGH (ref 70–99)
Glucose-Capillary: 115 mg/dL — ABNORMAL HIGH (ref 70–99)
Glucose-Capillary: 131 mg/dL — ABNORMAL HIGH (ref 70–99)
Glucose-Capillary: 140 mg/dL — ABNORMAL HIGH (ref 70–99)

## 2022-05-11 LAB — CBC
HCT: 28.8 % — ABNORMAL LOW (ref 36.0–46.0)
Hemoglobin: 9.8 g/dL — ABNORMAL LOW (ref 12.0–15.0)
MCH: 27.7 pg (ref 26.0–34.0)
MCHC: 34 g/dL (ref 30.0–36.0)
MCV: 81.4 fL (ref 80.0–100.0)
Platelets: 293 10*3/uL (ref 150–400)
RBC: 3.54 MIL/uL — ABNORMAL LOW (ref 3.87–5.11)
RDW: 15 % (ref 11.5–15.5)
WBC: 11.8 10*3/uL — ABNORMAL HIGH (ref 4.0–10.5)
nRBC: 0 % (ref 0.0–0.2)

## 2022-05-11 MED ORDER — CLOPIDOGREL BISULFATE 75 MG PO TABS
75.0000 mg | ORAL_TABLET | Freq: Every day | ORAL | Status: DC
  Administered 2022-05-12 – 2022-05-16 (×5): 75 mg via ORAL
  Filled 2022-05-11 (×5): qty 1

## 2022-05-11 MED ORDER — ASPIRIN 81 MG PO CHEW
81.0000 mg | CHEWABLE_TABLET | Freq: Every day | ORAL | Status: DC
  Administered 2022-05-12 – 2022-05-16 (×5): 81 mg via ORAL
  Filled 2022-05-11 (×5): qty 1

## 2022-05-11 MED ORDER — ATORVASTATIN CALCIUM 10 MG PO TABS
20.0000 mg | ORAL_TABLET | Freq: Every day | ORAL | Status: DC
  Administered 2022-05-12 – 2022-05-13 (×2): 20 mg via ORAL
  Filled 2022-05-11 (×2): qty 2

## 2022-05-11 MED ORDER — ENSURE ENLIVE PO LIQD
237.0000 mL | Freq: Three times a day (TID) | ORAL | Status: DC
  Administered 2022-05-12 – 2022-05-16 (×6): 237 mL via ORAL

## 2022-05-11 NOTE — Progress Notes (Signed)
    Subjective  - POD #7  Walked yesterday Tolerated clears Less abdominal pain Passing faltus, no stool   Physical Exam:  Abd less tender Extremities warm       Assessment/Plan:  POD #7  Advance to regular diet Continue to ambulate Abx for 10 days Check labs tomorrow  Annamarie Major 05/11/2022 7:48 AM --  Vitals:   05/11/22 0023 05/11/22 0458  BP: 132/62 (!) 140/77  Pulse: 80 83  Resp: 20 20  Temp: 98 F (36.7 C) 98.1 F (36.7 C)  SpO2: 99% 99%    Intake/Output Summary (Last 24 hours) at 05/11/2022 0748 Last data filed at 05/11/2022 2979 Gross per 24 hour  Intake 1730.24 ml  Output 2000 ml  Net -269.76 ml     Laboratory CBC    Component Value Date/Time   WBC 11.8 (H) 05/11/2022 0123   HGB 9.8 (L) 05/11/2022 0123   HCT 28.8 (L) 05/11/2022 0123   PLT 293 05/11/2022 0123    BMET    Component Value Date/Time   NA 139 05/07/2022 0109   NA 141 04/12/2022 1025   K 3.8 05/07/2022 0109   CL 106 05/07/2022 0109   CO2 23 05/07/2022 0109   GLUCOSE 102 (H) 05/07/2022 0109   BUN 9 05/07/2022 0109   BUN 8 04/12/2022 1025   CREATININE 0.84 05/07/2022 0109   CREATININE 0.80 03/13/2015 1628   CALCIUM 8.6 (L) 05/07/2022 0109   GFRNONAA >60 05/07/2022 0109   GFRAA >60 09/09/2018 1659    COAG No results found for: "INR", "PROTIME" No results found for: "PTT"  Antibiotics Anti-infectives (From admission, onward)    Start     Dose/Rate Route Frequency Ordered Stop   05/06/22 1400  piperacillin-tazobactam (ZOSYN) IVPB 3.375 g        3.375 g 12.5 mL/hr over 240 Minutes Intravenous Every 8 hours 05/06/22 0931     05/04/22 1830  piperacillin-tazobactam (ZOSYN) IVPB 3.375 g  Status:  Discontinued        3.375 g 12.5 mL/hr over 240 Minutes Intravenous Every 8 hours 05/04/22 1818 05/06/22 0931   05/04/22 0430  ciprofloxacin (CIPRO) IVPB 400 mg        400 mg 200 mL/hr over 60 Minutes Intravenous STAT 05/04/22 0331 05/04/22 0438   05/04/22 0430   metroNIDAZOLE (FLAGYL) IVPB 500 mg        500 mg 100 mL/hr over 60 Minutes Intravenous STAT 05/04/22 0331 05/04/22 0545        V. Leia Alf, M.D., [facility] [place] Office: [phone] Pager:  [phone]

## 2022-05-11 NOTE — Plan of Care (Signed)
  Problem: Education: Goal: Understanding of CV disease, CV risk reduction, and recovery process will improve Outcome: Progressing   Problem: Education: Goal: Knowledge of General Education information will improve Description: Including pain rating scale, medication(s)/side effects and non-pharmacologic comfort measures Outcome: Progressing   Problem: Clinical Measurements: Goal: Respiratory complications will improve Outcome: Progressing   Problem: Activity: Goal: Risk for activity intolerance will decrease Outcome: Progressing   Problem: Coping: Goal: Level of anxiety will decrease Outcome: Progressing

## 2022-05-11 NOTE — Progress Notes (Signed)
Nutrition Follow-up  DOCUMENTATION CODES:   Obesity unspecified  INTERVENTION:  Continue regular diet as tolerated. Brought pt menu and discussed how to order meals. Updated order so pt will receive assistance with ordering meals.  Will discontinue Boost Breeze per pt request.  Provide Ensure Enlive po TID, each supplement provides 350 kcal and 20 grams of protein.  Provide Magic cup TID with meals, each supplement provides 290 kcal and 9 grams of protein. Pt prefers chocolate.  NUTRITION DIAGNOSIS:   Inadequate oral intake related to acute illness (acute mesenteric ischemia s/p percutaneous thrombectomy of acute SMA thrombus with stenting, s/p ex lap with blood in peritoneum) as evidenced by per patient/family report (prolonged NPO/clear liquid diet).  Ongoing - diet now advanced today to regular.  GOAL:   Patient will meet greater than or equal to 90% of their needs  Progressing with diet advancement and interventions.  MONITOR:   PO intake, Supplement acceptance, Diet advancement, Labs, Weight trends, I & O's  REASON FOR ASSESSMENT:   NPO/Clear Liquid Diet    ASSESSMENT:   56 year old female with PMHx of HLD, NSTEMI s/p DES to LCX 07/02/2017, sarcoidosis, anemia who was admitted with acute mesenteric ischemia s/p percutaneous thrombectomy of acute SMA thrombus with stenting for residual chronic disease on 05/03/22. Underwent diagnostic laparoscopy and exploratory laparotomy 05/04/22 given increasing abdominal pain found to have blood in peritoneum with viable bowel.  11/29: diet advanced to clear liquids 12/1: diet advanced to regular  Met with pt at bedside. She reports she did not receive a breakfast tray or a lunch tray today. Noted no menu in room at time of RD assessment. Pt reports she did not know how to place order for meals (noted per diet order listed as appropriate for room service). Pt requesting chocolate ice cream from unit freezer. After checking with RN  brought pt chocolate ice cream. Brought pt menu and reviewed. Discussed how to call down to place order for meals. Pt reports she has been drinking Boost Breeze but does not like them. She is amenable to trying Ensure instead and also would like to receive Magic Cup with meals. Pt continues to pass flatus. No BM yet this admission.  No subsequent wt taken to trend since initial assessment.  UOP: 2000 mL or 0.9 mL/kg/hr  I/O: +6052.2 mL since admission  Medications reviewed and include: Boost Breeze TID, Novolog 1-3 units Q4hrs, pantoprazole, D5 in LR at 50 mL/hr, Zosyn  Labs reviewed: CBG 103-115. No recent chem profile >48 hours.  Discussed with RN. After discussing, it was decided to update diet order to "yes with assist" for room service so she receives assistance with ordering meals and hopefully will not miss any more meals.  Diet Order:   Diet Order             Diet regular Room service appropriate? Yes with Assist; Fluid consistency: Thin  Diet effective now                  EDUCATION NEEDS:   No education needs have been identified at this time  Skin:  Skin Assessment: Skin Integrity Issues: Skin Integrity Issues:: Incisions Incisions: closed incisions right groin, abdomen, left thigh  Last BM:  PTA  Height:   Ht Readings from Last 1 Encounters:  05/02/22 _0  (1.626 m)   Weight:   Wt Readings from Last 1 Encounters:  05/06/22 93.9 kg   Ideal Body Weight:  54.5 kg  BMI:  Body  mass index is 35.55 kg/m.  Estimated Nutritional Needs:   Kcal:  2000-2200  Protein:  100-110 grams  Fluid:  2-2.2 L/day  Loanne Drilling, MS, RD, LDN, CNSC Pager number available on Amion

## 2022-05-11 NOTE — Progress Notes (Signed)
Inpatient Rehab Admissions Coordinator:  ?Insurance authorization started. Will continue to follow. ? ? ?Adela Esteban Graves Madden, MS, CCC-SLP ?Admissions Coordinator ?[phone] ? ?

## 2022-05-11 NOTE — Progress Notes (Signed)
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 Pt underwent mechanical thrombectomy with SMA stent. Pt then with c/o increasing abdominal pain, increasing lactate levels, & pt underwent laparotomy which showed viable bowel, no signs of necrosis but findings of blood in peritoneal cavity with no extravasation. In PACU pt received fentanyl with decreased responsiveness & change in mental status, requiring BiPAP. PMH: CAD s/p stent, HLD, sarcoidosis, NTSEMI, anemia    PT Comments    Pt with slow progression for mobility today. Today's session focused on dynamic standing balance and ambulation. Pt ambulated 30 ft min guard with RW and cues to safely clear feet during swing phase. Tolerated standing ~ 3 min min guard during gown mgmt and continued to endorse SOB. SpO2 >93% during session and pt educated on energy conservation techniques. Pt remains limited by generalized weakness, decreased activity tolerance, and impaired balance strategies/postural reactions. Continue to recommend acute PT services to maximize functional mobility and independence prior to d/c to AIR level therapies.  Sitting BP 107/61, Standing BP 120/85     Recommendations for follow up therapy are one component of a multi-disciplinary discharge planning process, led by the attending physician.  Recommendations may be updated based on patient status, additional functional criteria and insurance authorization.  Follow Up Recommendations  Acute inpatient rehab (3hours/day)     Assistance Recommended at Discharge Intermittent Supervision/Assistance  Patient can return home with the following Assistance with cooking/housework;Assist for transportation;Help with stairs or ramp for entrance;A little  help with walking and/or transfers;A little help with bathing/dressing/bathroom   Equipment Recommendations  Rolling walker (2 wheels)    Recommendations for Other Services       Precautions / Restrictions Precautions Precautions: Fall;Other (comment) Precaution Comments: watch BP - pt reported dizziness Restrictions Weight Bearing Restrictions: No     Mobility  Bed Mobility Overal bed mobility: Needs Assistance             General bed mobility comments: pt sitting up in recliner upon arrival, able to lean forward and scoot hips back min guard with increased time and cues for abdominal comfort    Transfers Overall transfer level: Needs assistance Equipment used: Rolling walker (2 wheels) Transfers: Sit to/from Stand Sit to Stand: Supervision           General transfer comment: sit to stand x3 supervision, one minor posterior LOB but pt was able to recover before having to control descent into recliner    Ambulation/Gait Ambulation/Gait assistance: Min guard Gait Distance (Feet): 30 Feet Assistive device: Rolling walker (2 wheels) Gait Pattern/deviations: Step-through pattern, Decreased stride length, Narrow base of support, Decreased dorsiflexion - right, Decreased dorsiflexion - left Gait velocity: decreased     General Gait Details: gait today demonstrated increased lateral sway, required cues to maintain midline position as well as improve clearance of feet; one standing rest break needed because of reported SOB; declined further ambulation due to fatigue   Stairs             Wheelchair Mobility    Modified Rankin (Stroke Patients Only)       Balance Overall balance assessment: Needs assistance Sitting-balance support: Feet supported Sitting balance-Leahy Scale: Fair Sitting balance - Comments: dynamic weight shifting in recliner for clothing mgmt with no LOB   Standing balance support:  Single extremity supported, Bilateral upper extremity  supported, No upper extremity supported Standing balance-Leahy Scale: Poor Standing balance comment: at beginning of session, able to maintain standing balance while changing gown due to ice chip spill; as session progressed, pt with increased fatigue and reliance on RW, frequently attempting to lean forward over RW to "catch breath"                            Cognition Arousal/Alertness: Awake/alert Behavior During Therapy: Flat affect Overall Cognitive Status: Within Functional Limits for tasks assessed                                 General Comments: pt responds to questions with soft voice, endorses weakness and dizziness that pt believes to be because being unable to eat        Exercises      General Comments General comments (skin integrity, edema, etc.): SpO2 >93% on RA during mobility; BP reading of 107/61 while sitting and 120/85 upon ascent      Pertinent Vitals/Pain Pain Assessment Faces Pain Scale: Hurts even more Pain Location: abdomen - all over Pain Descriptors / Indicators: Guarding, Grimacing, Moaning Pain Intervention(s): Monitored during session, Limited activity within patient's tolerance, Repositioned    Home Living                          Prior Function            PT Goals (current goals can now be found in the care plan section) Acute Rehab PT Goals Patient Stated Goal: get better, go home PT Goal Formulation: With patient Time For Goal Achievement: 05/21/22 Potential to Achieve Goals: Good Progress towards PT goals: Progressing toward goals    Frequency    Min 3X/week      PT Plan Current plan remains appropriate    Co-evaluation              AM-PAC PT "6 Clicks" Mobility   Outcome Measure  Help needed turning from your back to your side while in a flat bed without using bedrails?: A Little Help needed moving from lying on your back to sitting on the side of a flat bed without using  bedrails?: A Little Help needed moving to and from a bed to a chair (including a wheelchair)?: A Little Help needed standing up from a chair using your arms (e.g., wheelchair or bedside chair)?: A Little Help needed to walk in hospital room?: A Little Help needed climbing 3-5 steps with a railing? : A Little 6 Click Score: 18    End of Session Equipment Utilized During Treatment: Gait belt Activity Tolerance: Patient limited by fatigue Patient left: in chair;with call bell/phone within reach Nurse Communication: Mobility status PT Visit Diagnosis: Muscle weakness (generalized) (M62.81);Difficulty in walking, not elsewhere classified (R26.2);Unsteadiness on feet (R26.81)     Time: 3748-2707 PT Time Calculation (min) (ACUTE ONLY): 34 min  Charges:  $Therapeutic Activity: 23-37 mins                     Chipper Oman, SPT    [place] Aarit Kashuba 05/11/2022, 1:48 PM

## 2022-05-11 NOTE — Progress Notes (Signed)
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 Pt underwent mechanical thrombectomy with SMA stent. Pt then with c/o increasing abdominal pain, increasing lactate levels, & pt underwent laparotomy which showed viable bowel, no signs of necrosis but findings of blood in peritoneal cavity with no extravasation. In PACU pt received fentanyl with decreased responsiveness & change in mental status, requiring BiPAP. PMH: CAD s/p stent, HLD, sarcoidosis, NTSEMI, anemia   OT comments  Pt. Seen for skilled OT treatment session.  Pt. Moving slow and describes overall weakness and fatigue but agreeable to participate.  Ambulation to/from b.room for toileting task min a.  Remains good candidate for AIR level therapies for continued strengthening and safety with mobility and adls prior to home.     Recommendations for follow up therapy are one component of a multi-disciplinary discharge planning process, led by the attending physician.  Recommendations may be updated based on patient status, additional functional criteria and insurance authorization.    Follow Up Recommendations  Acute inpatient rehab (3hours/day)     Assistance Recommended at Discharge Intermittent Supervision/Assistance  Patient can return home with the following  A lot of help with walking and/or transfers;A lot of help with bathing/dressing/bathroom;Help with stairs or ramp for entrance;Assist for transportation;Assistance with cooking/housework   Equipment Recommendations  Other (comment)    Recommendations for Other Services Rehab consult    Precautions / Restrictions Precautions Precautions: Fall;Other (comment) Precaution Comments: watch BP - pt reported dizziness       Mobility Bed  Mobility               General bed mobility comments: pt sitting up in recliner upon arrival, able to lean forward and scoot hips back min guard with increased time and cues for abdominal comfort    Transfers Overall transfer level: Needs assistance Equipment used: Rolling walker (2 wheels) Transfers: Sit to/from Stand, Bed to chair/wheelchair/BSC       Step pivot transfers: Min assist     General transfer comment: supervision to stand with cues for hand placement although pt declined using that method due to increased abdominal discomfort when pushing from recliner arm rests     Balance                                           ADL either performed or assessed with clinical judgement   ADL Overall ADL's : Needs assistance/impaired                         Toilet Transfer: Minimal assistance;Ambulation;Regular Toilet;Grab bars   Toileting- Clothing Manipulation and Hygiene: Set up;Sitting/lateral lean       Functional mobility during ADLs: Minimal assistance General ADL Comments: agreeable to participation, slow moving, notable SOB. pt. intiating rest breaks appropriately    Extremity/Trunk Assessment              Vision       Perception     Praxis      Cognition Arousal/Alertness: Awake/alert Behavior During Therapy: Flat affect Overall Cognitive Status: Within Functional Limits for tasks assessed  Exercises      Shoulder Instructions       General Comments  Boyfriend [name] present and very active and  helpful with pt.s care and her needs    Pertinent Vitals/ Pain       Pain Assessment Pain Assessment: Faces Faces Pain Scale: Hurts little more Pain Location: abdomen - all over Pain Descriptors / Indicators: Discomfort, Guarding, Grimacing Pain Intervention(s): Monitored during session, Repositioned  Home Living                                           Prior Functioning/Environment              Frequency  Min 2X/week        Progress Toward Goals  OT Goals(current goals can now be found in the care plan section)  Progress towards OT goals: Progressing toward goals     Plan Discharge plan remains appropriate    Co-evaluation                 AM-PAC OT "6 Clicks" Daily Activity     Outcome Measure   Help from another person eating meals?: Total Help from another person taking care of personal grooming?: A Little Help from another person toileting, which includes using toliet, bedpan, or urinal?: A Lot Help from another person bathing (including washing, rinsing, drying)?: A Lot Help from another person to put on and taking off regular upper body clothing?: A Lot Help from another person to put on and taking off regular lower body clothing?: A Lot 6 Click Score: 12    End of Session Equipment Utilized During Treatment: Rolling walker (2 wheels)  OT Visit Diagnosis: Muscle weakness (generalized) (M62.81)   Activity Tolerance Patient tolerated treatment well   Patient Left in chair;with call bell/phone within reach;with family/visitor present   Nurse Communication          Time: 1000-1020 OT Time Calculation (min): 20 min  Charges: OT General Charges $OT Visit: 1 Visit OT Treatments $Self Care/Home Management : 8-22 mins  [name], COTA/L Acute Rehabilitation [phone]   [name]-COTA/L 05/11/2022, 10:26 AM

## 2022-05-12 LAB — GLUCOSE, CAPILLARY
Glucose-Capillary: 121 mg/dL — ABNORMAL HIGH (ref 70–99)
Glucose-Capillary: 104 mg/dL — ABNORMAL HIGH (ref 70–99)
Glucose-Capillary: 114 mg/dL — ABNORMAL HIGH (ref 70–99)
Glucose-Capillary: 120 mg/dL — ABNORMAL HIGH (ref 70–99)

## 2022-05-12 LAB — CBC
HCT: 33 % — ABNORMAL LOW (ref 36.0–46.0)
Hemoglobin: 10.8 g/dL — ABNORMAL LOW (ref 12.0–15.0)
MCH: 27.5 pg (ref 26.0–34.0)
MCHC: 32.7 g/dL (ref 30.0–36.0)
MCV: 84 fL (ref 80.0–100.0)
Platelets: 420 10*3/uL — ABNORMAL HIGH (ref 150–400)
RBC: 3.93 MIL/uL (ref 3.87–5.11)
RDW: 15 % (ref 11.5–15.5)
WBC: 12.6 10*3/uL — ABNORMAL HIGH (ref 4.0–10.5)
nRBC: 0.2 % (ref 0.0–0.2)

## 2022-05-12 LAB — BASIC METABOLIC PANEL
Anion gap: 10 (ref 5–15)
BUN: 6 mg/dL (ref 6–20)
CO2: 19 mmol/L — ABNORMAL LOW (ref 22–32)
Calcium: 9.8 mg/dL (ref 8.9–10.3)
Chloride: 106 mmol/L (ref 98–111)
Creatinine, Ser: 0.86 mg/dL (ref 0.44–1.00)
GFR, Estimated: >60 mL/min (ref >60)
Glucose, Bld: 116 mg/dL — ABNORMAL HIGH (ref 70–99)
Potassium: 3.8 mmol/L (ref 3.5–5.1)
Sodium: 135 mmol/L (ref 135–145)

## 2022-05-12 LAB — TRIGLYCERIDES: Triglycerides: 167 mg/dL — ABNORMAL HIGH (ref <150)

## 2022-05-12 MED ORDER — INSULIN ASPART 100 UNIT/ML IJ SOLN: 1.0000 [IU] | Freq: Three times a day (TID) | INTRAMUSCULAR | Status: DC

## 2022-05-12 MED ORDER — PANTOPRAZOLE SODIUM 40 MG PO TBEC
40.0000 mg | DELAYED_RELEASE_TABLET | Freq: Two times a day (BID) | ORAL | Status: DC
  Administered 2022-05-12 – 2022-05-16 (×9): 40 mg via ORAL
  Filled 2022-05-12 (×9): qty 1

## 2022-05-12 MED ORDER — BISACODYL 10 MG RE SUPP
10.0000 mg | Freq: Once | RECTAL | Status: AC
  Administered 2022-05-12: 10 mg via RECTAL

## 2022-05-12 NOTE — Progress Notes (Addendum)
  Progress Note    05/12/2022 11:15 AM 8 Days Post-Op  Subjective:  sore with cough.  Denies nausea.  +passing gas but no BM.   Afebrile HR 70's-80's NSR 115'B-262'M systolic 35% RA  Vitals:   05/12/22 0420 05/12/22 0825  BP: 138/71 128/80  Pulse: 80 80  Resp: 20 16  Temp: 98.2 F (36.8 C) 98.5 F (36.9 C)  SpO2: 100% 100%    Physical Exam: General:  sitting in chair in no distress Lungs:  non labored Incision:  clean and dry   CBC    Component Value Date/Time   WBC 11.8 (H) 05/11/2022 0123   RBC 3.54 (L) 05/11/2022 0123   HGB 9.8 (L) 05/11/2022 0123   HCT 28.8 (L) 05/11/2022 0123   PLT 293 05/11/2022 0123   MCV 81.4 05/11/2022 0123   MCV 89.5 07/22/2018 1550   MCH 27.7 05/11/2022 0123   MCHC 34.0 05/11/2022 0123   RDW 15.0 05/11/2022 0123   LYMPHSABS 2.0 05/03/2022 1957   MONOABS 0.7 05/03/2022 1957   EOSABS 0.0 05/03/2022 1957   BASOSABS 0.0 05/03/2022 1957    BMET    Component Value Date/Time   NA 139 05/07/2022 0109   NA 141 04/12/2022 1025   K 3.8 05/07/2022 0109   CL 106 05/07/2022 0109   CO2 23 05/07/2022 0109   GLUCOSE 102 (H) 05/07/2022 0109   BUN 9 05/07/2022 0109   BUN 8 04/12/2022 1025   CREATININE 0.84 05/07/2022 0109   CREATININE 0.80 03/13/2015 1628   CALCIUM 8.6 (L) 05/07/2022 0109   GFRNONAA >60 05/07/2022 0109   GFRAA >60 09/09/2018 1659    INR No results found for: "INR"   Intake/Output Summary (Last 24 hours) at 05/12/2022 1115 Last data filed at 05/12/2022 0830 Gross per 24 hour  Intake 1801.92 ml  Output 850 ml  Net 951.92 ml      Assessment/Plan:  56 y.o. female is s/p:   percutaneous thrombectomy of acute SMA thrombus with stenting for residual chronic disease in setting of acute mesenteric ischemia.  She required ex lap giving increasing abdominal pain and found to have blood in her peritoneum with viable bowel.   8 Days Post-Op   -pt doing well with +flatus but no BM.  Denies N/V with eating. Will give  dulcolax supp this morning.  -sore with cough-I have asked if we can get her a heart pillow from [facility] to help with this.   -continue to mobilize -PT/OT recommending CIR-insurance authorization has started -DVT prophylaxis:  sq heparin -glucose checks to ac/hs   Leontine Locket, PA-C [facility] [phone] 05/12/2022 11:15 AM  VASCULAR STAFF ADDENDUM: I have independently interviewed and examined the patient. I agree with the above.  Abdominal pain with coughing Passing gas, no BM Overall progressing appropriately.  Cassandria Santee, MD [facility] Phone Number: [phone] 05/12/2022 12:30 PM

## 2022-05-13 LAB — CBC
HCT: 31.1 % — ABNORMAL LOW (ref 36.0–46.0)
Hemoglobin: 10.5 g/dL — ABNORMAL LOW (ref 12.0–15.0)
MCH: 27.8 pg (ref 26.0–34.0)
MCHC: 33.8 g/dL (ref 30.0–36.0)
MCV: 82.3 fL (ref 80.0–100.0)
Platelets: 394 10*3/uL (ref 150–400)
RBC: 3.78 MIL/uL — ABNORMAL LOW (ref 3.87–5.11)
RDW: 14.9 % (ref 11.5–15.5)
WBC: 10.5 10*3/uL (ref 4.0–10.5)
nRBC: 0.2 % (ref 0.0–0.2)

## 2022-05-13 LAB — GLUCOSE, CAPILLARY
Glucose-Capillary: 115 mg/dL — ABNORMAL HIGH (ref 70–99)
Glucose-Capillary: 122 mg/dL — ABNORMAL HIGH (ref 70–99)
Glucose-Capillary: 111 mg/dL — ABNORMAL HIGH (ref 70–99)

## 2022-05-13 MED ORDER — ATORVASTATIN CALCIUM 40 MG PO TABS
40.0000 mg | ORAL_TABLET | Freq: Every day | ORAL | Status: DC
  Administered 2022-05-14 – 2022-05-16 (×3): 40 mg via ORAL
  Filled 2022-05-13 (×3): qty 1

## 2022-05-13 NOTE — Progress Notes (Addendum)
  Progress Note    05/13/2022 7:41 AM 9 Days Post-Op  Subjective:  still having some abdominal pain.  She did have a BM yesterday.  She has walked. Denies N/V.  Boyfriend at bedside.   Afebrile HR 70's-80's NSR 423'T-532'Y systolic 23% RA  Vitals:   05/13/22 0357 05/13/22 0735  BP: (!) 141/65 (!) 145/71  Pulse: 83 89  Resp: (!) 21 20  Temp: 98.7 F (37.1 C) 98.4 F (36.9 C)  SpO2: 92% 93%    Physical Exam: General:  no distress Lungs:  non labored Incisions:  clean with staples in tact Abdomen:  soft, NT  CBC    Component Value Date/Time   WBC 10.5 05/13/2022 0046   RBC 3.78 (L) 05/13/2022 0046   HGB 10.5 (L) 05/13/2022 0046   HCT 31.1 (L) 05/13/2022 0046   PLT 394 05/13/2022 0046   MCV 82.3 05/13/2022 0046   MCV 89.5 07/22/2018 1550   MCH 27.8 05/13/2022 0046   MCHC 33.8 05/13/2022 0046   RDW 14.9 05/13/2022 0046   LYMPHSABS 2.0 05/03/2022 1957   MONOABS 0.7 05/03/2022 1957   EOSABS 0.0 05/03/2022 1957   BASOSABS 0.0 05/03/2022 1957    BMET    Component Value Date/Time   NA 135 05/12/2022 1349   NA 141 04/12/2022 1025   K 3.8 05/12/2022 1349   CL 106 05/12/2022 1349   CO2 19 (L) 05/12/2022 1349   GLUCOSE 116 (H) 05/12/2022 1349   BUN 6 05/12/2022 1349   BUN 8 04/12/2022 1025   CREATININE 0.86 05/12/2022 1349   CREATININE 0.80 03/13/2015 1628   CALCIUM 9.8 05/12/2022 1349   GFRNONAA >60 05/12/2022 1349   GFRAA >60 09/09/2018 1659    INR No results found for: "INR"   Intake/Output Summary (Last 24 hours) at 05/13/2022 0741 Last data filed at 05/13/2022 0736 Gross per 24 hour  Intake 1423.18 ml  Output 1100 ml  Net 323.18 ml      Assessment/Plan:  56 y.o. female is s/p:   percutaneous thrombectomy of acute SMA thrombus with stenting for residual chronic disease in setting of acute mesenteric ischemia.  She required ex lap giving increasing abdominal pain and found to have blood in her peritoneum with viable bowel 9 Days Post-Op   -pt  doing ok this morning.  Still having trouble with cough.  Strongly encouraged use of pillow to hold against abdomen to help her get deeper cough and continue IS.  Continue increasing mobilization.   -tolerating diet without nausea/vomiting.  Had BM yesterday.  -hgb stable  -leukocytosis improved from yesterday at 10.5k down from 12.6k & pt is afebrile -continue asa/statin/plavix -DVT prophylaxis:  sq heparin   Leontine Locket, PA-C [facility] [phone] 05/13/2022 7:41 AM  VASCULAR STAFF ADDENDUM: I have independently interviewed and examined the patient. I agree with the above.  Bowel movement yesterday, states that she feels tired today after not sleeping well last night. Plan for CIR consult tomorrow as recommended by physical therapy.  Cassandria Santee, MD [facility] Phone Number: [phone] 05/13/2022 2:15 PM

## 2022-05-14 LAB — CBC
HCT: 32.6 % — ABNORMAL LOW (ref 36.0–46.0)
Hemoglobin: 10.8 g/dL — ABNORMAL LOW (ref 12.0–15.0)
MCH: 27.3 pg (ref 26.0–34.0)
MCHC: 33.1 g/dL (ref 30.0–36.0)
MCV: 82.3 fL (ref 80.0–100.0)
Platelets: 447 10*3/uL — ABNORMAL HIGH (ref 150–400)
RBC: 3.96 MIL/uL (ref 3.87–5.11)
RDW: 15.1 % (ref 11.5–15.5)
WBC: 10.7 10*3/uL — ABNORMAL HIGH (ref 4.0–10.5)
nRBC: 0 % (ref 0.0–0.2)

## 2022-05-14 LAB — GLUCOSE, CAPILLARY
Glucose-Capillary: 105 mg/dL — ABNORMAL HIGH (ref 70–99)
Glucose-Capillary: 132 mg/dL — ABNORMAL HIGH (ref 70–99)
Glucose-Capillary: 80 mg/dL (ref 70–99)
Glucose-Capillary: 112 mg/dL — ABNORMAL HIGH (ref 70–99)

## 2022-05-14 NOTE — Progress Notes (Signed)
[facility] [place]  Subjective  - no new complaints   Objective 125/78 89 98.2 F (36.8 C) (Oral) (!) 22 96%  Intake/Output Summary (Last 24 hours) at 05/14/2022 0726 Last data filed at 05/14/2022 0410 Gross per 24 hour  Intake 822.45 ml  Output 2550 ml  Net -1727.55 ml   Feet warm and well perfused Abdominal incision healing well + BS to auscultation Lungs non labored breathing    Assessment/Planning: POD # 39 56 y.o. female is s/p:   percutaneous thrombectomy of acute SMA thrombus with stenting for residual chronic disease in setting of acute mesenteric ischemia.  She required ex lap giving increasing abdominal pain and found to have blood in her peritoneum with viable bowel  Positive BS to auscultation.  No N/V + BM 05/13/22 + leukocytosis   Roxy Horseman 05/14/2022 7:26 AM --  Laboratory Lab Results: Recent Labs    05/13/22 0046 05/14/22 0123  WBC 10.5 10.7*  HGB 10.5* 10.8*  HCT 31.1* 32.6*  PLT 394 447*   BMET Recent Labs    05/12/22 1349  NA 135  K 3.8  CL 106  CO2 19*  GLUCOSE 116*  BUN 6  CREATININE 0.86  CALCIUM 9.8    COAG No results found for: "INR", "PROTIME" No results found for: "PTT"

## 2022-05-14 NOTE — Progress Notes (Signed)
Inpatient Rehab Admissions Coordinator:    Continue to await auth for CIR admit; however, note Pt. Walking long distance in the hallway with therapy earlier. She's progressing well, so may be able to just d/c home.   Clemens Catholic, [place], [place] Admissions Coordinator  [phone] ([place]) [phone] (office)

## 2022-05-14 NOTE — Progress Notes (Signed)
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 Pt underwent mechanical thrombectomy with SMA stent. Pt then with c/o increasing abdominal pain, increasing lactate levels, & pt underwent laparotomy which showed viable bowel, no signs of necrosis but findings of blood in peritoneal cavity with no extravasation. In PACU pt received fentanyl with decreased responsiveness & change in mental status, requiring BiPAP. PMH: CAD s/p stent, HLD, sarcoidosis, NTSEMI, anemia    PT Comments    Patient progressing well towards PT goals. Session focused on progressive ambulation and endurance. Tolerated gait training with Min guard assist and use of RW for support. HR up to 126 bpm with activity and noted to need 2 standing rest breaks due to 2/4 DOE. Due to improvement in mobility, discharge recommendation updated to home with HHPT and family support. Pt reports she can borrow a RW from her sister?? Encouraged walking 2 more times today with mobility tech and family. RN made aware. Will follow.    Recommendations for follow up therapy are one component of a multi-disciplinary discharge planning process, led by the attending physician.  Recommendations may be updated based on patient status, additional functional criteria and insurance authorization.  Follow Up Recommendations  Home health PT     Assistance Recommended at Discharge Intermittent Supervision/Assistance  Patient can return home with the following Assistance with cooking/housework;Assist for transportation;Help with stairs or ramp for entrance;A little help with walking and/or transfers;A little help with bathing/dressing/bathroom   Equipment Recommendations  Rolling walker (2 wheels) (can get one from her  sister per report?)    Recommendations for Other Services       Precautions / Restrictions Precautions Precautions: Fall Restrictions Weight Bearing Restrictions: No     Mobility  Bed Mobility Overal bed mobility: Needs Assistance Bed Mobility: Rolling, Sidelying to Sit Rolling: Min guard Sidelying to sit: Min assist, HOB elevated       General bed mobility comments: Pt reaching out to family in room to assist wtih trunk elevation to get to EOB.    Transfers Overall transfer level: Needs assistance Equipment used: Rolling walker (2 wheels), None Transfers: Sit to/from Stand Sit to Stand: Supervision           General transfer comment: Supervision for safety. Stood from [employer], transferred to chair post ambulation. No RW needed for second stand, holding onto bed rail.    Ambulation/Gait Ambulation/Gait assistance: Min guard Gait Distance (Feet): 140 Feet Assistive device: Rolling walker (2 wheels) Gait Pattern/deviations: Step-through pattern, Decreased stride length, Narrow base of support Gait velocity: decreased Gait velocity interpretation: <1.31 ft/sec, indicative of household ambulator   General Gait Details: Slow, mostly steady gait with RW for support. 2 standing rest breaks. 2/4 DOE. VSS on RA.   Stairs             Wheelchair Mobility    Modified Rankin (Stroke Patients Only)       Balance Overall balance assessment: Needs assistance Sitting-balance support: Feet supported, No upper extremity supported Sitting balance-Leahy Scale: Good     Standing balance support: During functional activity Standing balance-Leahy Scale: Fair Standing balance comment: Able to stand statically without UE support but does better with UE support for walking.  Cognition Arousal/Alertness: Awake/alert Behavior During Therapy: Flat affect Overall Cognitive Status: Within Functional Limits for tasks assessed                                  General Comments: pt responds to questions with soft voice, motivated today.        Exercises      General Comments General comments (skin integrity, edema, etc.): VSS on RA throughout activity.      Pertinent Vitals/Pain Pain Assessment Pain Assessment: Faces Faces Pain Scale: Hurts a little bit Pain Location: abdomen - all over Pain Descriptors / Indicators: Guarding Pain Intervention(s): Monitored during session, Repositioned    Home Living                          Prior Function            PT Goals (current goals can now be found in the care plan section) Progress towards PT goals: Progressing toward goals    Frequency    Min 3X/week      PT Plan Discharge plan needs to be updated    Co-evaluation              AM-PAC PT "6 Clicks" Mobility   Outcome Measure  Help needed turning from your back to your side while in a flat bed without using bedrails?: A Little Help needed moving from lying on your back to sitting on the side of a flat bed without using bedrails?: A Little Help needed moving to and from a bed to a chair (including a wheelchair)?: A Little Help needed standing up from a chair using your arms (e.g., wheelchair or bedside chair)?: A Little Help needed to walk in hospital room?: A Little Help needed climbing 3-5 steps with a railing? : A Lot 6 Click Score: 17    End of Session Equipment Utilized During Treatment: Gait belt Activity Tolerance: Patient tolerated treatment well Patient left: in chair;with call bell/phone within reach;with family/visitor present Nurse Communication: Mobility status PT Visit Diagnosis: Muscle weakness (generalized) (M62.81);Difficulty in walking, not elsewhere classified (R26.2);Unsteadiness on feet (R26.81)     Time: 1137 (1108-1112)-1152 PT Time Calculation (min) (ACUTE ONLY): 15 min  Charges:  $Gait Training: 8-22 mins                     Marisa Severin, PT,  DPT Acute Rehabilitation Services Secure chat preferred Office [facility] 05/14/2022, 12:35 PM

## 2022-05-15 LAB — GLUCOSE, CAPILLARY
Glucose-Capillary: 104 mg/dL — ABNORMAL HIGH (ref 70–99)
Glucose-Capillary: 87 mg/dL (ref 70–99)
Glucose-Capillary: 97 mg/dL (ref 70–99)

## 2022-05-15 LAB — CBC
HCT: 34.4 % — ABNORMAL LOW (ref 36.0–46.0)
Hemoglobin: 11.8 g/dL — ABNORMAL LOW (ref 12.0–15.0)
MCH: 28 pg (ref 26.0–34.0)
MCHC: 34.3 g/dL (ref 30.0–36.0)
MCV: 81.5 fL (ref 80.0–100.0)
Platelets: 526 10*3/uL — ABNORMAL HIGH (ref 150–400)
RBC: 4.22 MIL/uL (ref 3.87–5.11)
RDW: 15.1 % (ref 11.5–15.5)
WBC: 14.1 10*3/uL — ABNORMAL HIGH (ref 4.0–10.5)
nRBC: 0 % (ref 0.0–0.2)

## 2022-05-15 LAB — TRIGLYCERIDES: Triglycerides: 146 mg/dL (ref <150)

## 2022-05-15 MED ORDER — SIMETHICONE 80 MG PO CHEW
80.0000 mg | CHEWABLE_TABLET | Freq: Four times a day (QID) | ORAL | Status: DC | PRN
  Administered 2022-05-15 (×2): 80 mg via ORAL
  Filled 2022-05-15 (×3): qty 1

## 2022-05-15 NOTE — H&P (Incomplete)
Physical Medicine and Rehabilitation Admission H&P    Chief Complaint  Patient presents with   Abdominal Pain  : HPI:  [name] is a 56 year old right-handed female with history of CAD status post stenting 2019 maintained on aspirin, hyperlipidemia, sarcoidosis , tobacco use.  Per chart review patient lives with boyfriend.  1 level home 2 steps to entry.  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  Presented 05/02/2022 to the [facility] ED with 2 days of generalized abdominal pain with associated nausea.  CTA at [facility] showed thrombus in the upper abdominal aorta extending into the superior mesenteric artery with additional thrombus extending into the proximal SMA that was near occlusive..  Underwent percutaneous thrombectomy stenting of her SMA 05/03/2022 per Dr. Monica Martinez.  Postoperatively continued to have abdominal pain suspect peritonitis/hemoperitoneum and she returned to the operating room for diagnostic laparoscopy and laparotomy 05/04/2022 per Dr. Rosendo Gros.  Postoperative acute hypoxic/hypercapnic respiratory failure and patient remained in the ICU.  A nasogastric tube remained in place for decompression as well as nutritional support.  Patient was stabilized and discharged to stepdown unit.  Hospital course anemia 9.3-10.8.  She was cleared to begin low-dose aspirin as well as Plavix.  Subcutaneous heparin added for DVT prophylaxis.  Her diet has been advanced to regular consistency.  Therapy evaluations completed due to patient decreased functional mobility was admitted for a comprehensive rehab program. Review of Systems  Constitutional:  Positive for malaise/fatigue. Negative for chills and fever.  HENT:  Negative for hearing loss.   Eyes:  Negative for blurred vision and double vision.  Respiratory:  Negative for cough, shortness of breath and wheezing.   Cardiovascular:  Negative for chest pain, palpitations and leg swelling.   Gastrointestinal:  Positive for abdominal pain and nausea. Negative for melena.  Genitourinary:  Negative for dysuria, flank pain and hematuria.  Skin:  Negative for rash.  All other systems reviewed and are negative.  Past Medical History:  Diagnosis Date   Anemia    Fibroid    Hyperlipidemia    NSTEMI (non-ST elevated myocardial infarction) ([place]) 06/2017   s/p DES to LCX 07/02/17   Sarcoidosis    Past Surgical History:  Procedure Laterality Date   ABDOMINAL HYSTERECTOMY     BREAST BIOPSY Right 03/24/2018   BREAST REDUCTION SURGERY Bilateral 10/17/2020   Procedure: MAMMARY REDUCTION  (BREAST);  Surgeon: Cindra Presume, MD;  Location: [place];  Service: Plastics;  Laterality: Bilateral;  2 hours   CESAREAN SECTION  1987   CORONARY/GRAFT ACUTE MI REVASCULARIZATION N/A 06/30/2017   Procedure: Coronary/Graft Acute MI Revascularization;  Surgeon: Lorretta Harp, MD;  Location: [facility];  Service: Cardiovascular;  Laterality: N/A;   FOREARM FRACTURE SURGERY Right 1977   FRACTURE SURGERY     LAPAROSCOPY N/A 05/04/2022   Procedure: LAPAROSCOPY DIAGNOSTIC;  Surgeon: Ralene Ok, MD;  Location: [facility];  Service: General;  Laterality: N/A;   LAPAROTOMY N/A 05/04/2022   Procedure: EXPLORATORY LAPAROTOMY;  Surgeon: Ralene Ok, MD;  Location: [facility];  Service: General;  Laterality: N/A;   LEFT HEART CATH AND CORONARY ANGIOGRAPHY N/A 06/30/2017   Procedure: LEFT HEART CATH AND CORONARY ANGIOGRAPHY;  Surgeon: Lorretta Harp, MD;  Location: [facility];  Service: Cardiovascular;  Laterality: N/A;   LEFT HEART CATH AND CORONARY ANGIOGRAPHY N/A 12/23/2017   Procedure: LEFT HEART CATH AND CORONARY ANGIOGRAPHY;  Surgeon: Lorretta Harp, MD;  Location: [facility];  Service: Cardiovascular;  Laterality: N/A;   TUBAL LIGATION  1990   Family History  Problem Relation Age of Onset   Diabetes Mother    Stroke Mother 90   Hypertension Mother    Hyperlipidemia  Father    Stroke Father 78   Hypertension Father    Breast cancer Paternal Grandmother    Social History:  reports that she has been smoking cigarettes. She has a 29.00 pack-year smoking history. She has never used smokeless tobacco. She reports current alcohol use. She reports that she does not use drugs. Allergies:  Allergies  Allergen Reactions   Penicillins Itching    Caused rash and blisters   Hydrocodone Hives and Itching   Medications Prior to Admission  Medication Sig Dispense Refill   albuterol (VENTOLIN HFA) 108 (90 Base) MCG/ACT inhaler Inhale 1-2 puffs into the lungs every 6 (six) hours as needed for wheezing or shortness of breath. 1 each 0   aspirin EC 81 MG tablet Take 1 tablet (81 mg total) by mouth daily. Swallow whole. 30 tablet    atorvastatin (LIPITOR) 20 MG tablet TAKE 1 TABLET BY MOUTH EVERY DAY (Patient taking differently: Take 20 mg by mouth daily.) 90 tablet 3   benzonatate (TESSALON) 100 MG capsule Take 1 capsule (100 mg total) by mouth every 8 (eight) hours. 21 capsule 0   cholecalciferol (VITAMIN D3) 25 MCG (1000 UNIT) tablet Take 2,000 Units by mouth daily. Gummies     gabapentin (NEURONTIN) 300 MG capsule Take 1 capsule (300 mg total) by mouth at bedtime. 90 capsule 0   predniSONE (DELTASONE) 10 MG tablet Take 2 tablets (20 mg total) by mouth daily for 10 days. 20 tablet 0      Home: Home Living Family/patient expects to be discharged to:: Private residence Living Arrangements: Spouse/significant other (boyfriendL [name]) Available Help at Discharge: Family, Other (Comment) (Boyfriend works nights and can assist during the day.) Type of Home: Apartment Home Access: Stairs to enter [employer] of Steps: 2 flights of stairs to access apartment on 2nd level Entrance Stairs-Rails: Right, Left (wide set) Home Layout: One level Bathroom Shower/Tub: Optometrist: Yes Home Equipment: None  Lives  With: Spouse   Functional History: Prior Function Prior Level of Function : Independent/Modified Independent, Driving, Working/employed Mobility Comments: Pt was independent, driving, 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  Functional Status:  Mobility: Bed Mobility Overal bed mobility:  (Pt in recliner upon therapy arrival) Bed Mobility: Rolling, Sidelying to Sit Rolling: Min guard Sidelying to sit: Min assist, HOB elevated General bed mobility comments: Pt reaching out to family in room to assist wtih trunk elevation to get to EOB. Transfers Overall transfer level: Needs assistance Equipment used: Rolling walker (2 wheels) Transfers: Sit to/from Stand Sit to Stand: Supervision Bed to/from chair/wheelchair/BSC transfer type:: Stand pivot Step pivot transfers: Min assist General transfer comment: Supervision for safety. Stood from [employer], transferred to chair post ambulation. No RW needed for second stand, holding onto bed rail. Ambulation/Gait Ambulation/Gait assistance: Min guard Gait Distance (Feet): 140 Feet Assistive device: Rolling walker (2 wheels) Gait Pattern/deviations: Step-through pattern, Decreased stride length, Narrow base of support General Gait Details: Slow, mostly steady gait with RW for support. 2 standing rest breaks. 2/4 DOE. VSS on RA. Gait velocity: decreased Gait velocity interpretation: <1.31 ft/sec, indicative of household ambulator    ADL: ADL Overall ADL's : Needs assistance/impaired Eating/Feeding: NPO Grooming: Wash/dry hands, Wash/dry face, Oral care, Supervision/safety, Standing  Upper Body Bathing: Moderate assistance, Sitting Lower Body Bathing: Moderate assistance, Sit to/from stand Upper Body Dressing : Moderate assistance, Sitting Lower Body Dressing: Supervision/safety, Sitting/lateral leans, Sit to/from stand Toilet Transfer: Minimal assistance, Ambulation, Regular Toilet, Grab bars Toileting- Clothing Manipulation and Hygiene: Set up,  Sitting/lateral lean Functional mobility during ADLs: Supervision/safety, Rolling walker (2 wheels) General ADL Comments: Pt with notable SOB during functional standing ADL.  Cognition: Cognition Overall Cognitive Status: Within Functional Limits for tasks assessed Orientation Level: Oriented X4 Cognition Arousal/Alertness: Awake/alert Behavior During Therapy: Flat affect Overall Cognitive Status: Within Functional Limits for tasks assessed General Comments: soft voice used during session  Physical Exam: Blood pressure 123/85, pulse 98, temperature 98.1 F (36.7 C), temperature source Oral, resp. rate 20, height '5\' 4"'$  (1.626 m), weight 93.9 kg, SpO2 97 %. Physical Exam Neurological:     Comments: Patient is alert.  Oriented x 3 and follows commands.     Results for orders placed or performed during the hospital encounter of 05/02/22 (from the past 48 hour(s))  Glucose, capillary     Status: Abnormal   Collection Time: 05/13/22 11:00 AM  Result Value Ref Range   Glucose-Capillary 122 (H) 70 - 99 mg/dL    Comment: Glucose reference range applies only to samples taken after fasting for at least 8 hours.  Glucose, capillary     Status: Abnormal   Collection Time: 05/13/22  8:50 PM  Result Value Ref Range   Glucose-Capillary 111 (H) 70 - 99 mg/dL    Comment: Glucose reference range applies only to samples taken after fasting for at least 8 hours.  CBC     Status: Abnormal   Collection Time: 05/14/22  1:23 AM  Result Value Ref Range   WBC 10.7 (H) 4.0 - 10.5 K/uL   RBC 3.96 3.87 - 5.11 MIL/uL   Hemoglobin 10.8 (L) 12.0 - 15.0 g/dL   HCT 32.6 (L) 36.0 - 46.0 %   MCV 82.3 80.0 - 100.0 fL   MCH 27.3 26.0 - 34.0 pg   MCHC 33.1 30.0 - 36.0 g/dL   RDW 15.1 11.5 - 15.5 %   Platelets 447 (H) 150 - 400 K/uL   nRBC 0.0 0.0 - 0.2 %    Comment: Performed at [facility], [place] [address] [place], [place] [address]  Glucose, capillary     Status: Abnormal   Collection Time:  05/14/22  6:11 AM  Result Value Ref Range   Glucose-Capillary 105 (H) 70 - 99 mg/dL    Comment: Glucose reference range applies only to samples taken after fasting for at least 8 hours.  Glucose, capillary     Status: Abnormal   Collection Time: 05/14/22 12:27 PM  Result Value Ref Range   Glucose-Capillary 132 (H) 70 - 99 mg/dL    Comment: Glucose reference range applies only to samples taken after fasting for at least 8 hours.   Comment 1 Notify RN    Comment 2 Document in Chart   Glucose, capillary     Status: None   Collection Time: 05/14/22  4:49 PM  Result Value Ref Range   Glucose-Capillary 80 70 - 99 mg/dL    Comment: Glucose reference range applies only to samples taken after fasting for at least 8 hours.   Comment 1 Notify RN    Comment 2 Document in Chart   Glucose, capillary     Status: Abnormal   Collection Time: 05/14/22 10:36 PM  Result Value Ref Range  Glucose-Capillary 112 (H) 70 - 99 mg/dL    Comment: Glucose reference range applies only to samples taken after fasting for at least 8 hours.   No results found.    Blood pressure 123/85, pulse 98, temperature 98.1 F (36.7 C), temperature source Oral, resp. rate 20, height '5\' 4"'$  (1.626 m), weight 93.9 kg, SpO2 97 %.  Medical Problem List and Plan: 1. Functional deficits secondary to debility secondary to thrombus upper abdominal aortic stenting of superior mesenteric artery with additional thrombus extending into the proximal SMA.  Status post percutaneous thrombectomy stenting of SMA 51/07/5850 complicated by peritonitis/hemoperitoneum with laparoscopy and laparotomy 05/04/2022  -patient may *** shower  -ELOS/Goals: *** 2.  Antithrombotics: -DVT/anticoagulation:  Pharmaceutical: Heparin  -antiplatelet therapy: Aspirin 81 mg daily and Plavix 75 mg daily 3. Pain Management: Oxycodone as needed 4. Mood/Behavior/Sleep: Provide emotional support  -antipsychotic agents: N/A 5. Neuropsych/cognition: This patient is  capable of making decisions on her own behalf. 6. Skin/Wound Care: Routine skin checks 7. Fluids/Electrolytes/Nutrition: Routine in and outs with follow-up chemistries 8.  Acute blood loss anemia.  Follow-up CBC 9.  Hypertension.  Norvasc 10 mg daily.  Monitor with increased mobility 10.  History of CAD/stenting.  Currently continue with aspirin and Plavix therapy.  No chest pain or shortness of breath 11.  Hyperlipidemia.  Lipitor 12.  History of sarcoidosis.  Follow-up outpatient 13.  History of tobacco abuse.  Counseling    ***  Cathlyn Parsons, PA-C 05/15/2022

## 2022-05-15 NOTE — Progress Notes (Signed)
Mobility Specialist Progress Note:   05/15/22 0903  Mobility  Activity Ambulated with assistance in room;Ambulated with assistance to bathroom  Level of Assistance Standby assist, set-up cues, supervision of patient - no hands on  Assistive Device None  Distance Ambulated (ft) 40 ft  Activity Response Tolerated well  $Mobility charge 1 Mobility   Pt received in bathroom needing to get cleaned up. Complaints of incision pain. Left in chair with call bell in reach and all needs met.   Gareth Eagle Jillayne Witte Mobility Specialist Please contact via [employer] or  Rehab Office at [phone]

## 2022-05-15 NOTE — Plan of Care (Signed)
  Problem: Activity: Goal: Ability to return to baseline activity level will improve Outcome: Progressing   Problem: Clinical Measurements: Goal: Will remain free from infection Outcome: Progressing

## 2022-05-15 NOTE — Progress Notes (Signed)
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 Pt underwent mechanical thrombectomy with SMA stent. Pt then with c/o increasing abdominal pain, increasing lactate levels, & pt underwent laparotomy which showed viable bowel, no signs of necrosis but findings of blood in peritoneal cavity with no extravasation. In PACU pt received fentanyl with decreased responsiveness & change in mental status, requiring BiPAP. PMH: CAD s/p stent, HLD, sarcoidosis, NTSEMI, anemia   OT comments  Pt in recliner upon therapy arrival and agreeable to participate in OT treatment. Significant other present in room. Session focused on overall endurance while standing and activity tolerance. Pt able to complete grooming task for almost 3' standing. Does present with SOB after standing for an extended amount of time. Pt demonstrates improvement with overall functional performance since initial evaluation. Discharge recommendation updated to [place] [facility].    Recommendations for follow up therapy are one component of a multi-disciplinary discharge planning process, led by the attending physician.  Recommendations may be updated based on patient status, additional functional criteria and insurance authorization.    Follow Up Recommendations  Home health OT     Assistance Recommended at Discharge PRN  Patient can return home with the following  A little help with walking and/or transfers;A little help with bathing/dressing/bathroom;Assistance with cooking/housework;Help with stairs or ramp for entrance;Assist for transportation   Equipment Recommendations  Tub/shower seat       Precautions / Restrictions Precautions Precautions: Fall Precaution Comments: watch BP - pt reported  dizziness Restrictions Weight Bearing Restrictions: No       Mobility Bed Mobility Overal bed mobility:  (Pt in recliner upon therapy arrival)     Patient Response: Flat affect, Cooperative  Transfers Overall transfer level: Needs assistance Equipment used: Rolling walker (2 wheels) Transfers: Sit to/from Stand Sit to Stand: Supervision       Balance Overall balance assessment: Needs assistance Sitting-balance support: Feet supported, No upper extremity supported Sitting balance-Leahy Scale: Good     Standing balance support: During functional activity Standing balance-Leahy Scale: Fair Standing balance comment: Able to stand intermittently without UE support while completing grooming task. Due to decreased core strength and standing endurance, pt did lean down on bilateral elbows using counter briefly.       ADL either performed or assessed with clinical judgement   ADL Overall ADL's : Needs assistance/impaired     Grooming: Wash/dry hands;Wash/dry face;Oral care;Supervision/safety;Standing     Lower Body Dressing: Supervision/safety;Sitting/lateral leans;Sit to/from stand     Functional mobility during ADLs: Supervision/safety;Rolling walker (2 wheels) General ADL Comments: Pt with notable SOB during functional standing ADL.      Cognition Arousal/Alertness: Awake/alert Behavior During Therapy: Flat affect Overall Cognitive Status: Within Functional Limits for tasks assessed     General Comments: soft voice used during session              General Comments On RA during session. Pt able to tolerate standing task for 2'47". Once seated, HR: 105, SpO2: 99%. HR remained at 100-105 after extended sitting. Pt reports her HR is elevated at baseline typically.    Pertinent Vitals/ Pain       Pain Assessment Pain Assessment: No/denies pain Pain Score: 0-No pain         Frequency  Min 2X/week  Progress Toward Goals  OT Goals(current goals  can now be found in the care plan section)  Progress towards OT goals: Progressing toward goals     Plan Discharge plan needs to be updated;Frequency remains appropriate       AM-PAC OT "6 Clicks" Daily Activity     Outcome Measure   Help from another person eating meals?: None Help from another person taking care of personal grooming?: None Help from another person toileting, which includes using toliet, bedpan, or urinal?: A Little Help from another person bathing (including washing, rinsing, drying)?: A Little Help from another person to put on and taking off regular upper body clothing?: A Little Help from another person to put on and taking off regular lower body clothing?: A Little 6 Click Score: 20    End of Session Equipment Utilized During Treatment: Rolling walker (2 wheels)  OT Visit Diagnosis: Muscle weakness (generalized) (M62.81)   Activity Tolerance Patient tolerated treatment well   Patient Left in chair;with call bell/phone within reach;with family/visitor present           Time: 6720-9470 OT Time Calculation (min): 15 min  Charges: OT General Charges $OT Visit: 1 Visit OT Treatments $Therapeutic Activity: 8-22 mins  Ailene Ravel, OTR/L,CBIS  Supplemental OT - [facility] and [facility]   Donnovan Stamour, Clarene Duke 05/15/2022, 10:00 AM

## 2022-05-15 NOTE — Progress Notes (Signed)
Inpatient Rehab Admissions Coordinator:    Pt. States she'd prefer to d/c home rather than CIR. I continue to await insurance auth for CIR in any case. I will let TOC know Pt. Prefers HH.  Clemens Catholic, [place], [place] Admissions Coordinator  [phone] ([place]) [phone] (office)

## 2022-05-15 NOTE — Progress Notes (Addendum)
[facility] [place] [facility]  Subjective  - Doing better each day   Objective 139/74 94 98.4 F (36.9 C) (Axillary) 20 97%  Intake/Output Summary (Last 24 hours) at 05/15/2022 4944 Last data filed at 05/15/2022 0053 Gross per 24 hour  Intake 0 ml  Output 1000 ml  Net -1000 ml    Feet warm well perfused Abdominal incision healing well, + BS Lungs non labored breathing  Assessment/Planning: POD # 50 56 y.o. female is s/p:   percutaneous thrombectomy of acute SMA thrombus with stenting for residual chronic disease in setting of acute mesenteric ischemia.  She required ex lap giving increasing abdominal pain and found to have blood in her peritoneum with viable bowel  + BS, incision healing well Tolerating PO's without N/V Increasing mobility Cont PT for mobility possible discharge in the next 1-2 days  Roxy Horseman 05/15/2022 7:22 AM --  Laboratory Lab Results: Recent Labs    05/13/22 0046 05/14/22 0123  WBC 10.5 10.7*  HGB 10.5* 10.8*  HCT 31.1* 32.6*  PLT 394 447*   BMET Recent Labs    05/12/22 1349  NA 135  K 3.8  CL 106  CO2 19*  GLUCOSE 116*  BUN 6  CREATININE 0.86  CALCIUM 9.8    COAG No results found for: "INR", "PROTIME" No results found for: "PTT"  I have seen and evaluated the patient. I agree with the PA note as documented above.  Status post SMA percutaneous thrombectomy with stenting of the SMA for residual chronic disease in the setting of acute mesenteric ischemia.  Required ex lap for increasing abdominal pain found to have blood in the peritoneal cavity.  Looks good this morning.  Tolerating regular diet.  Having bowel function.  Plan likely discharge tomorrow.  She has been improving with therapy and they are now recommending home health instead of CIR.  Aspirin Plavix for SMA stent.  Marty Heck, MD [facility] Office: [phone]

## 2022-05-16 LAB — GLUCOSE, CAPILLARY: Glucose-Capillary: 109 mg/dL — ABNORMAL HIGH (ref 70–99)

## 2022-05-16 LAB — CBC
HCT: 33.8 % — ABNORMAL LOW (ref 36.0–46.0)
Hemoglobin: 11.3 g/dL — ABNORMAL LOW (ref 12.0–15.0)
MCH: 27.5 pg (ref 26.0–34.0)
MCHC: 33.4 g/dL (ref 30.0–36.0)
MCV: 82.2 fL (ref 80.0–100.0)
Platelets: 554 10*3/uL — ABNORMAL HIGH (ref 150–400)
RBC: 4.11 MIL/uL (ref 3.87–5.11)
RDW: 15 % (ref 11.5–15.5)
WBC: 12.4 10*3/uL — ABNORMAL HIGH (ref 4.0–10.5)
nRBC: 0 % (ref 0.0–0.2)

## 2022-05-16 MED ORDER — ATORVASTATIN CALCIUM 40 MG PO TABS: 40.0000 mg | ORAL_TABLET | Freq: Every day | ORAL | 2 refills | Status: DC

## 2022-05-16 MED ORDER — OXYCODONE-ACETAMINOPHEN 5-325 MG PO TABS: 1.0000 | ORAL_TABLET | Freq: Four times a day (QID) | ORAL | 0 refills | Status: DC | PRN

## 2022-05-16 MED ORDER — CLOPIDOGREL BISULFATE 75 MG PO TABS: 75.0000 mg | ORAL_TABLET | Freq: Every day | ORAL | 11 refills | Status: DC

## 2022-05-16 NOTE — Progress Notes (Signed)
Patient c/o pain in right upper quadrant of abdomen. Patient stated that it started around 2ish but fell asleep and did not mention it to dayshift nurse. Patient also stated that the above area feel warmer than the rest of her abdominal area.   VSS. Patient a bit tachypneic, resp 27. Afebrile. Abdomen soft but tender. Right upper quadrant definitely warmer upon assessment. Patient c/o feet being cool. Pulses palpable but weak.   Paged Dr. Scot Dock to come and assess. Will watch for now.  Patient walked independently to bathroom. Tylenol given with scheduled night time meds. PW placed for night time only.  Patient resting comfortably. Will continue to monitor

## 2022-05-16 NOTE — Progress Notes (Signed)
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 S/p mechanical thrombectomy with SMA stent. Pt then with c/o increasing abdominal pain, increasing lactate levels; s/p laparotomy which showed viable bowel, no signs of necrosis but findings of blood in peritoneal cavity with no extravasation. In PACU pt received fentanyl with decreased responsiveness & change in mental status, requiring BiPAP. PMH includes CAD s/p stent, HLD, sarcoidosis, NTSEMI, anemia.   PT Comments    Pt progressing well with mobility. Pt ambulatory without DME, tolerated stair training at supervision-level. Pt preparing for d/c home today; reviewed education, including activity recommendations and importance of frequent mobility. Pt reports no further questions or concerns. If to remain admitted, will continue to follow acutely to address established goals.     Recommendations for follow up therapy are one component of a multi-disciplinary discharge planning process, led by the attending physician.  Recommendations may be updated based on patient status, additional functional criteria and insurance authorization.  Follow Up Recommendations  Home health PT     Assistance Recommended at Discharge Intermittent Supervision/Assistance  Patient can return home with the following Assistance with cooking/housework;Assist for transportation;Help with stairs or ramp for entrance;A little help with bathing/dressing/bathroom   Equipment Recommendations  None recommended by PT    Recommendations for Other Services       Precautions / Restrictions Precautions Precautions: Other (comment) Precaution Comments: abdominal precautions for comfort Restrictions Weight Bearing Restrictions: No      Mobility  Bed Mobility Overal bed mobility: Modified Independent Bed Mobility: Supine to Sit     Supine to sit: Modified independent (Device/Increase time), HOB elevated          Transfers Overall transfer level: Modified independent Equipment used: None Transfers: Sit to/from Stand             General transfer comment: multiple sit<>stands from EOB, toilet and transport chair mod indep for increased time    Ambulation/Gait Ambulation/Gait assistance: Supervision Gait Distance (Feet): 50 Feet Assistive device: None Gait Pattern/deviations: Step-through pattern, Decreased stride length, Trunk flexed, Antalgic Gait velocity: Decreased     General Gait Details: slow, guarded gait without DME, supervision for safety; deferred further ambulation distance to prioritize stair training   Stairs Stairs: Yes Stairs assistance: Supervision Stair Management: Two rails, Alternating pattern, Forwards Number of Stairs: 10 General stair comments: able to ascend/descend 10 steps with BUE rail support, alternating with quick speed; standing rest break at top of stairs due to fatigue and SOB before descending; educ on technique for energy conservation and seated rest if needed on 2 flights of steps at home   Wheelchair Mobility    Modified Rankin (Stroke Patients Only)       Balance Overall balance assessment: Needs assistance Sitting-balance support: Feet supported, No upper extremity supported Sitting balance-Leahy Scale: Good     Standing balance support: During functional activity, No upper extremity supported Standing balance-Leahy Scale: Good                              Cognition Arousal/Alertness: Awake/alert Behavior During Therapy: Flat affect Overall Cognitive Status: Within Functional Limits for tasks assessed  Exercises      General Comments General comments (skin integrity,  edema, etc.): pt's significant other present at end of session, supportive. pt reports daughter to take her home today; pt declines need for DME, educ on daily activity recommendations; pt reports no further questions or concerns      Pertinent Vitals/Pain Pain Assessment Pain Assessment: Faces Faces Pain Scale: Hurts a little bit Pain Location: abdomen Pain Descriptors / Indicators: Sore Pain Intervention(s): Monitored during session    Home Living                          Prior Function            PT Goals (current goals can now be found in the care plan section) Progress towards PT goals: Progressing toward goals    Frequency    Min 3X/week      PT Plan Current plan remains appropriate    Co-evaluation              AM-PAC PT "6 Clicks" Mobility   Outcome Measure  Help needed turning from your back to your side while in a flat bed without using bedrails?: None Help needed moving from lying on your back to sitting on the side of a flat bed without using bedrails?: None Help needed moving to and from a bed to a chair (including a wheelchair)?: None Help needed standing up from a chair using your arms (e.g., wheelchair or bedside chair)?: None Help needed to walk in hospital room?: A Little Help needed climbing 3-5 steps with a railing? : A Little 6 Click Score: 22    End of Session   Activity Tolerance: Patient tolerated treatment well Patient left: in chair;with call bell/phone within reach;with family/visitor present Nurse Communication: Mobility status PT Visit Diagnosis: Muscle weakness (generalized) (M62.81);Difficulty in walking, not elsewhere classified (R26.2);Unsteadiness on feet (R26.81)     Time: 7412-8786 PT Time Calculation (min) (ACUTE ONLY): 17 min  Charges:  $Gait Training: 8-22 mins                     Mabeline Caras, PT, DPT Acute Rehabilitation Services  Personal: [place] [facility] Office: [place] 05/16/2022, 9:25 AM

## 2022-05-16 NOTE — Plan of Care (Signed)
  Problem: Education: Goal: Understanding of CV disease, CV risk reduction, and recovery process will improve Outcome: Adequate for Discharge   Problem: Activity: Goal: Ability to return to baseline activity level will improve Outcome: Adequate for Discharge   Problem: Cardiovascular: Goal: Ability to achieve and maintain adequate cardiovascular perfusion will improve Outcome: Adequate for Discharge   Problem: Health Behavior/Discharge Planning: Goal: Ability to safely manage health-related needs after discharge will improve Outcome: Adequate for Discharge   Problem: Education: Goal: Knowledge of General Education information will improve Description: Including pain rating scale, medication(s)/side effects and non-pharmacologic comfort measures Outcome: Adequate for Discharge   Problem: Health Behavior/Discharge Planning: Goal: Ability to manage health-related needs will improve Outcome: Adequate for Discharge   Problem: Clinical Measurements: Goal: Ability to maintain clinical measurements within normal limits will improve Outcome: Adequate for Discharge Goal: Will remain free from infection Outcome: Adequate for Discharge Goal: Diagnostic test results will improve Outcome: Adequate for Discharge Goal: Respiratory complications will improve Outcome: Adequate for Discharge Goal: Cardiovascular complication will be avoided Outcome: Adequate for Discharge   Problem: Activity: Goal: Risk for activity intolerance will decrease Outcome: Adequate for Discharge   Problem: Nutrition: Goal: Adequate nutrition will be maintained Outcome: Adequate for Discharge   Problem: Coping: Goal: Level of anxiety will decrease Outcome: Adequate for Discharge   Problem: Elimination: Goal: Will not experience complications related to bowel motility Outcome: Adequate for Discharge Goal: Will not experience complications related to urinary retention Outcome: Adequate for Discharge    Problem: Pain Managment: Goal: General experience of comfort will improve Outcome: Adequate for Discharge   Problem: Safety: Goal: Ability to remain free from injury will improve Outcome: Adequate for Discharge   Problem: Skin Integrity: Goal: Risk for impaired skin integrity will decrease Outcome: Adequate for Discharge   Problem: Clinical Measurements: Goal: Ability to maintain clinical measurements within normal limits will improve Outcome: Adequate for Discharge Goal: Postoperative complications will be avoided or minimized Outcome: Adequate for Discharge   Problem: Skin Integrity: Goal: Demonstration of wound healing without infection will improve Outcome: Adequate for Discharge   Problem: Acute Rehab PT Goals(only PT should resolve) Goal: Pt will Roll Supine to Side Outcome: Adequate for Discharge Goal: Pt Will Go Supine/Side To Sit Outcome: Adequate for Discharge Goal: Pt Will Go Sit To Supine/Side Outcome: Adequate for Discharge Goal: Pt Will Transfer Bed To Chair/Chair To Bed Outcome: Adequate for Discharge Goal: Pt Will Ambulate Outcome: Adequate for Discharge Goal: Pt Will Go Up/Down Stairs Outcome: Adequate for Discharge Goal: Pt/caregiver will Perform Home Exercise Program Outcome: Adequate for Discharge   Problem: Acute Rehab OT Goals (only OT should resolve) Goal: Pt. Will Perform Upper Body Bathing Outcome: Adequate for Discharge Goal: Pt. Will Perform Upper Body Dressing Outcome: Adequate for Discharge Goal: Pt. Will Transfer To Toilet Outcome: Adequate for Discharge Goal: Pt. Will Perform Toileting-Clothing Manipulation Outcome: Adequate for Discharge   Problem: Inadequate Intake (NI-2.1) Goal: Food and/or nutrient delivery Description: Individualized approach for food/nutrient provision. Outcome: Adequate for Discharge

## 2022-05-16 NOTE — Progress Notes (Addendum)
[facility]  Subjective  - doing well over all   Objective (!) 146/85 97 98.4 F (36.9 C) (Oral) 20 95%  Intake/Output Summary (Last 24 hours) at 05/16/2022 8185 Last data filed at 05/16/2022 0154 Gross per 24 hour  Intake 0 ml  Output --  Net 0 ml    Abdomin soft, incision healing well B LE warm and well perfused Lungs non labored breathing  Assessment/Planning: POD # 37  56 y.o. female is s/p:   percutaneous thrombectomy of acute SMA thrombus with stenting for residual chronic disease in setting of acute mesenteric ischemia.  She required ex lap giving increasing abdominal pain and found to have blood in her peritoneum with viable bowel  Ambulating, tol PO's without N/V, + BM.  Plan for discharge home today F/U in 2 weeks for incision al check and staple removal.  3 months for SMA duplex. Cont. ASA, Lipitor and Plavix daily.     Roxy Horseman 05/16/2022 7:22 AM --  Laboratory Lab Results: Recent Labs    05/15/22 1149 05/16/22 0120  WBC 14.1* 12.4*  HGB 11.8* 11.3*  HCT 34.4* 33.8*  PLT 526* 554*   BMET No results for input(s): "NA", "K", "CL", "CO2", "GLUCOSE", "BUN", "CREATININE", "CALCIUM" in the last 72 hours.  COAG No results found for: "INR", "PROTIME" No results found for: "PTT"  I have seen and evaluated the patient. I agree with the PA note as documented above.  Now postop day 12 status post SMA percutaneous thrombectomy with stenting of the SMA for residual chronic disease.  Overall looks good.  Tolerating regular diet and had a bowel movement.  Plan discharge today.  Will arrange follow-up in several weeks for staple removal.  Will get mesenteric duplex at that time as well for baseline study.  Discussed aspirin Plavix statin for SMA stent.  Discussed she call with questions or concerns.  PT recommending home health.  Marty Heck, MD [facility] Office:  [phone]

## 2022-05-16 NOTE — Progress Notes (Signed)
Discharge instructions (including medications) discussed with and copy provided to patient/caregiver 

## 2022-05-16 NOTE — Plan of Care (Signed)
  Problem: Health Behavior/Discharge Planning: Goal: Ability to manage health-related needs will improve Outcome: Progressing   Problem: Clinical Measurements: Goal: Will remain free from infection Outcome: Progressing Goal: Diagnostic test results will improve Outcome: Progressing Goal: Respiratory complications will improve Outcome: Progressing   

## 2022-05-16 NOTE — TOC Initial Note (Signed)
Transition of Care [facility]) - Initial/Assessment Note    Patient Details  Name: [name] MRN: [id number] Date of Birth: 1965-11-10  Transition of [facility] CM/SW Contact:    Bethena Roys, RN Phone Number: 05/16/2022, 11:15 AM  Clinical Narrative:  Patient POD-12 percutaneous thrombectomy of acute SMA thrombus with stenting. Case Manager received a consult for home health services. Case Manager spoke with patient and she provided verbal permission for Case Manager to call her Daughter [name] to discuss the home health agency. Daughter did not have an agency preference; however, she did want an agency in network with her mothers insurance. Case Manager was able to contact [facility] and they can accept the patient at the daughters [place] [address]. Start of care to begin within 24-48 hours post transition home. Patient reported that she did not need a rolling walker for home. Daughter to aide in transportation. No further needs identified.                 Expected Discharge Plan: [place] Barriers to Discharge: No Barriers Identified   Patient Goals and CMS Choice Patient states their goals for this hospitalization and ongoing recovery are:: to return to her daughters home.   Choice offered to / list presented to : Adult Children (spoke with family over the phone-daughter had no preference only that the agency be in network.)  Expected Discharge Plan and Services Expected Discharge Plan: [place] In-house Referral: NA Discharge Planning Services: CM Consult Post Acute Care Choice: [place] arrangements for the past 2 months: Single Family Home Expected Discharge Date: 05/16/22               DME Arranged: N/A DME Agency: NA       HH Arranged: PT, OT HH Agency: [facility] Date HH Agency Contacted: 05/16/22 Time [place]: [phone] Representative spoke with at [place]: Claiborne Billings  Prior Living Arrangements/Services Living arrangements for the past 2 months: [place] with:: Self Patient language and need for interpreter reviewed:: Yes Do you feel safe going back to the place where you live?: Yes      Need for Family Participation in Patient Care: Yes (Comment) Care giver support system in place?: Yes (comment)   Criminal Activity/Legal Involvement Pertinent to Current Situation/Hospitalization: No - Comment as needed  Activities of Daily Living Home Assistive Devices/Equipment: None ADL Screening (condition at time of admission) Patient's cognitive ability adequate to safely complete daily activities?: Yes Is the patient deaf or have difficulty hearing?: No Does the patient have difficulty seeing, even when wearing glasses/contacts?: No Does the patient have difficulty concentrating, remembering, or making decisions?: No Patient able to express need for assistance with ADLs?: Yes Does the patient have difficulty dressing or bathing?: No Independently performs ADLs?: Yes (appropriate for developmental age) Does the patient have difficulty walking or climbing stairs?: No Weakness of Legs: None Weakness of Arms/Hands: None  Permission Sought/Granted Permission sought to share information with : Family Supports, Case Optician, dispensing granted to share information with : Yes, Verbal Permission Granted     Permission granted to share info w AGENCY: [facility]        Emotional Assessment Appearance:: Appears stated age Attitude/Demeanor/Rapport: Engaged Affect (typically observed): Appropriate Orientation: : Oriented to Situation, Oriented to  Time, Oriented to Place, Oriented to Self Alcohol / Substance Use: Not Applicable Psych Involvement:  No (comment)  Admission diagnosis:  Generalized abdominal pain [R10.84] Superior mesenteric artery thrombosis (HCC) [K55.069] Aortic thrombus (HCC) [I74.10] Observation  after surgery [Z48.89] Acute mesenteric ischemia (HCC) [K55.059] S/P laparotomy [Z98.890] Patient Active Problem List   Diagnosis Date Noted   S/P laparotomy 05/04/2022   Observation after surgery 05/03/2022   Acute mesenteric ischemia ([place]) 05/03/2022   Flu-like symptoms 06/17/2018   Cough 06/17/2018   Lower respiratory infection 06/17/2018   Chest pain, rule out acute myocardial infarction 12/20/2017   CAD (coronary artery disease) 12/20/2017   Lung nodule, multiple 07/04/2017   Post PTCA 07/04/2017   Abnormal EKG    Acute chest pain    Femoral artery hematoma complicating cardiac catheterization 07/02/2017   Hyperlipidemia with target LDL less than 70 07/02/2017   Tobacco abuse 07/02/2017   NSTEMI (non-ST elevated myocardial infarction) [facility])    Knee LCL sprain 03/20/2017   Rectal bleeding 10/18/2016   Special screening for malignant neoplasms, colon 10/18/2016   Vitamin D deficiency 04/16/2015   PCP:  Glendon Axe, MD Pharmacy:   [facility] [place], [address] [place] [address] Phone: [phone] Fax: [phone]  Readmission Risk Interventions     No data to display

## 2022-05-16 NOTE — Progress Notes (Signed)
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 S/p mechanical thrombectomy with SMA stent. Pt then with c/o increasing abdominal pain, increasing lactate levels; s/p laparotomy which showed viable bowel, no signs of necrosis but findings of blood in peritoneal cavity with no extravasation. In PACU pt received fentanyl with decreased responsiveness & change in mental status, requiring BiPAP. PMH includes CAD s/p stent, HLD, sarcoidosis, NTSEMI, anemia.   OT comments  Pt progressing well in OT.  She is completing most ADLs and mobility at supervision to modified independent level.  Reviewed tub transfers and recommendations for shower seat due to decreased activity tolerance (pt reports plan to self purchase) and voiced understanding with following MD recommendations due to incisions.  She is concerned about activity tolerance, reviewed energy conservation and provided handout.  Continue to recommend HHOT at dc to assist pt in progressing in independence and activity tolerance with ADLs and IADLs.    Recommendations for follow up therapy are one component of a multi-disciplinary discharge planning process, led by the attending physician.  Recommendations may be updated based on patient status, additional functional criteria and insurance authorization.    Follow Up Recommendations  Home health OT     Assistance Recommended at Discharge PRN  Patient can return home with the following  A little help with walking and/or transfers;A little help with bathing/dressing/bathroom;Assistance with cooking/housework;Help with stairs or ramp for entrance;Assist for transportation   Equipment Recommendations  Tub/shower seat (pt plans to self purchase)    Recommendations  for Other Services      Precautions / Restrictions Precautions Precautions: Other (comment) Precaution Comments: abdominal precautions for comfort Restrictions Weight Bearing Restrictions: No       Mobility Bed Mobility               General bed mobility comments: OOB in recliner    Transfers                         Balance Overall balance assessment: Needs assistance Sitting-balance support: Feet supported, No upper extremity supported Sitting balance-Leahy Scale: Good     Standing balance support: Bilateral upper extremity supported, During functional activity Standing balance-Leahy Scale: Good                             ADL either performed or assessed with clinical judgement   ADL Overall ADL's : Needs assistance/impaired             Lower Body Bathing: Supervison/ safety;Sitting/lateral leans;Sit to/from stand;Cueing for compensatory techniques   Upper Body Dressing : Supervision/safety;Sitting   Lower Body Dressing: Supervision/safety;Sit to/from stand;Sitting/lateral leans;Cueing for compensatory techniques   Toilet Transfer: Modified Independent;Ambulation;Rolling walker (2 wheels)       Tub/ Shower Transfer: Tub transfer;Min guard;Ambulation;Shower seat;Rolling walker (2 wheels) Tub/Shower Transfer Details (indicate cue type and reason): simulated in room, using reverse step technique for UE support Functional mobility during ADLs: Supervision/safety;Rolling walker (2 wheels) General ADL Comments: pt educated to follow MD recommendations for showering, but reviewed technique and recommendations for [place] due to poor tolerance.  Pt educated on energy conservation techniques for ADLs due to poor tolerance and SOB with minimal activity.    Extremity/Trunk Assessment  Vision       Perception     Praxis      Cognition Arousal/Alertness: Awake/alert Behavior During Therapy: Flat affect Overall Cognitive  Status: Within Functional Limits for tasks assessed                                          Exercises      Shoulder Instructions       General Comments pt reports concern for completing activities due to SOB, reviewed and provided handout for energy conservation techniques    Pertinent Vitals/ Pain       Pain Assessment Pain Assessment: Faces Faces Pain Scale: Hurts a little bit Pain Location: abdomen Pain Descriptors / Indicators: Sore Pain Intervention(s): Limited activity within patient's tolerance, Monitored during session, Repositioned  Home Living                                          Prior Functioning/Environment              Frequency  Min 2X/week        Progress Toward Goals  OT Goals(current goals can now be found in the care plan section)  Progress towards OT goals: Progressing toward goals  Acute Rehab OT Goals Patient Stated Goal: get stronger OT Goal Formulation: With patient Time For Goal Achievement: 05/21/22 Potential to Achieve Goals: Good  Plan Discharge plan remains appropriate;Frequency remains appropriate    Co-evaluation                 AM-PAC OT "6 Clicks" Daily Activity     Outcome Measure   Help from another person eating meals?: None Help from another person taking care of personal grooming?: A Little Help from another person toileting, which includes using toliet, bedpan, or urinal?: A Little Help from another person bathing (including washing, rinsing, drying)?: A Little Help from another person to put on and taking off regular upper body clothing?: A Little Help from another person to put on and taking off regular lower body clothing?: A Little 6 Click Score: 19    End of Session Equipment Utilized During Treatment: Rolling walker (2 wheels)  OT Visit Diagnosis: Muscle weakness (generalized) (M62.81)   Activity Tolerance Patient tolerated treatment well   Patient Left in  chair;with call bell/phone within reach;with family/visitor present   Nurse Communication Mobility status        Time: 2952-8413 OT Time Calculation (min): 21 min  Charges: OT General Charges $OT Visit: 1 Visit OT Treatments $Self Care/Home Management : 8-22 mins  Jolaine Artist, OT Acute Rehabilitation Services Office [phone]   Delight Stare 05/16/2022, 9:59 AM

## 2022-05-16 NOTE — Discharge Instructions (Signed)
 [facility]  Discharge Instructions   Open Aortic Surgery  Please refer to the following instructions for your post-procedure care. Your surgeon or Physician Assistant will discuss any changes with you.  Activity  Avoid lifting more than eight pounds (a gallon of milk) until after your first post-operative visit. You are encouraged to walk as much as you can. You can slowly return to normal activities but must avoid strenuous activity and heavy lifting until your doctor tells you it's OK. Heavy lifting can hurt the incision and cause a hernia. Avoid activities such as vacuuming or swinging a golf club. It is normal to feel tired for several weeks after your surgery. Do not drive until your doctor gives the OK and you are no longer taking prescription pain medications. It is also normal to have difficulty with sleep habits, eating and bowl movements after surgery. These will go away with time.  Bathing/Showering  You may shower after you go home. Do not soak in a bathtub, hot tub, or swim until the incision heals.  Incision Care  Shower every day. Clean your incision with mild soap and water. Pat the area dry with a clean towel. You do not need a bandage unless otherwise instructed. Do not apply any ointments or creams to your incision. You may have skin glue on your incision. Do not peel it off. It will come off on its own in about one week. If you have staples or sutures along your incision, they will be removed at your post op appointment.  Diet  Resume your normal diet. There are no special food restriction following this procedure. A low fat/low cholesterol diet is recommended for all patients with vascular disease. After your aortic surgery, it's normal to feel full faster than usual and to not feel as hungry as you normally would. You will probably lose weight initially following your surgery. It's best to eat small, frequent meals over the course of  the day. Call the office if you find that you are unable to eat even small meals. In order to heal from your surgery, it is CRITICAL to get adequate nutrition. Your body requires vitamins, minerals, and protein. Vegetables are the best source of vitamins and minerals. causing pain, you may take over-the-counter pain reliever such as acetaminophen (Tylenol). If you were prescribed a stronger pain medication, please be aware these medication can cause nausea and constipation. Prevent nausea by taking the medication with a snack or meal. Avoid constipation by drinking plenty of fluids and eating foods with a high amount of fiber, such as fruits, vegetables and grains. Take 100mg of the over-the-counter stool softener Colace twice a day as needed to help with constipation. A laxative, such as Milk of Magnesia, may be recommended for you at this time. Do not take a laxative unless your surgeon or P.A. tells you it's OK. Do not take Tylenol if you are taking stronger pain medications (such as Percocet).  Follow Up  Our office will schedule a follow up appointment 2-3 weeks after discharge.  Please call us immediately for any of the following conditions    .     Severe or worsening pain in your legs or feet or in your abdomen back or chest. Increased pain, redness drainage (pus) from your incision site. Increased abdominal pain, bloating, nausea, vomiting, or persistent diarrhea. Fever of 101 degrees or higher. Swelling in your leg (s).  Reduce your risk of vascular disease  Stop smoking.   If you would like help, call [facility] at 1-800-QUIT-NOW (1-800-784-8669) or [place] at [phone]. Manage your cholesterol Maintain a desired weight Control your diabetes Keep your blood pressure down  If you have any questions please call the office at [phone].   

## 2022-05-16 NOTE — Discharge Summary (Signed)
[facility] Discharge Summary   Patient ID:  [name] MRN: [id number] DOB/AGE: [birth year] 56 y.o.  Admit date: 05/02/2022 Discharge date: 05/16/2022 Date of Surgery: 05/04/2022 Surgeon: Surgeon(s): Ralene Ok, MD  Admission Diagnosis: Generalized abdominal pain [R10.84] Superior mesenteric artery thrombosis ([place]) [K55.069] Aortic thrombus ([place]) [I74.10] Observation after surgery [Z48.89] Acute mesenteric ischemia ([place]) [K55.059] S/P laparotomy [Z98.890]  Discharge Diagnoses:  Generalized abdominal pain [R10.84] Superior mesenteric artery thrombosis ([place]) [K55.069] Aortic thrombus ([place]) [I74.10] Observation after surgery [Z48.89] Acute mesenteric ischemia ([place]) [K55.059] S/P laparotomy [Z98.890]  Secondary Diagnoses: Past Medical History:  Diagnosis Date   Anemia    Fibroid    Hyperlipidemia    NSTEMI (non-ST elevated myocardial infarction) ([place]) 06/2017   s/p DES to LCX 07/02/17   Sarcoidosis     Procedure(s): LAPAROSCOPY DIAGNOSTIC EXPLORATORY LAPAROTOMY  Discharged Condition: stable  HPI: This is a 55 y.o. female with history of coronary artery disease status post NSTEMI in 2019 and previous tobacco abuse for 30 years that vascular surgery has been consulted for SMA thrombus.   She reported a 2 day history of Nausea and increased abdominal pain.  She denies any history of abdominal issues.   CTA at [facility] showed thrombus in the upper abdominal aorta extending into the superior mesenteric artery.   She was taken to the OR emergently.    Hospital Course:  Acute mesenteric ischemia with SMA thrombus  S/P Percutaneous mechanical thrombectomy of the SMA with Angioplasty and stent of the proximal SMA. Later that evening she developed a bout of emesis.  General surgery was consulted.   S/P Laparoscopy exploration by Dr. Rosendo Gros.  EBL:  none, approximately 1 L of old blood within the abdominal cavity. This appeared to be the cause of  her peritonitis. Upon exploration there is no active bleeding that could be found. Patient did have a hematoma at the base of her mesentery. This was nonexpanding. There is no hematoma to the right retroperitoneal area.   Her post op course was stable once her pain was controlled and her bowel was working she increased her activity.  Gradual apo intake to fulls as of 05/11/22.  Brisk dopplers of B LE was maintained.  Her leukocytosis was managed with antibiotics.  She was transferred to 4E progressive POD# 5.  She was advance to sips and chips with PO medication.  She was started with ASA, Statin, and Plavix.  Advanced to regular diet POD # 7.  She was discharged in stable condition 05/16/22.  F/U in 2-3 weeks for duplex and staple removal.  Consults:  Treatment Team:  Marty Heck, MD  Significant Diagnostic Studies: CBC Lab Results  Component Value Date   WBC 12.4 (H) 05/16/2022   HGB 11.3 (L) 05/16/2022   HCT 33.8 (L) 05/16/2022   MCV 82.2 05/16/2022   PLT 554 (H) 05/16/2022    BMET    Component Value Date/Time   NA 135 05/12/2022 1349   NA 141 04/12/2022 1025   K 3.8 05/12/2022 1349   CL 106 05/12/2022 1349   CO2 19 (L) 05/12/2022 1349   GLUCOSE 116 (H) 05/12/2022 1349   BUN 6 05/12/2022 1349   BUN 8 04/12/2022 1025   CREATININE 0.86 05/12/2022 1349   CREATININE 0.80 03/13/2015 1628   CALCIUM 9.8 05/12/2022 1349   GFRNONAA >60 05/12/2022 1349   GFRAA >60 09/09/2018 1659   COAG No results found for: "INR", "PROTIME"   Disposition:  Discharge to :Home Discharge Instructions  Call MD for:  redness, tenderness, or signs of infection (pain, swelling, bleeding, redness, odor or green/yellow discharge around incision site)   Complete by: As directed    Call MD for:  severe or increased pain, loss or decreased feeling  in affected limb(s)   Complete by: As directed    Call MD for:  temperature >100.5   Complete by: As directed    Discharge instructions   Complete  by: As directed    You may shower daily   Resume previous diet   Complete by: As directed       Allergies as of 05/16/2022       Reactions   Penicillins Itching   Caused rash and blisters   Hydrocodone Hives, Itching        Medication List     TAKE these medications    albuterol 108 (90 Base) MCG/ACT inhaler Commonly known as: VENTOLIN HFA Inhale 1-2 puffs into the lungs every 6 (six) hours as needed for wheezing or shortness of breath.   aspirin EC 81 MG tablet Take 1 tablet (81 mg total) by mouth daily. Swallow whole.   atorvastatin 40 MG tablet Commonly known as: LIPITOR Take 1 tablet (40 mg total) by mouth daily. What changed:  medication strength how much to take   benzonatate 100 MG capsule Commonly known as: TESSALON Take 1 capsule (100 mg total) by mouth every 8 (eight) hours.   cholecalciferol 25 MCG (1000 UNIT) tablet Commonly known as: VITAMIN D3 Take 2,000 Units by mouth daily. Gummies   clopidogrel 75 MG tablet Commonly known as: PLAVIX Take 1 tablet (75 mg total) by mouth daily with breakfast.   gabapentin 300 MG capsule Commonly known as: NEURONTIN Take 1 capsule (300 mg total) by mouth at bedtime.   oxyCODONE-acetaminophen 5-325 MG tablet Commonly known as: PERCOCET/ROXICET Take 1 tablet by mouth every 6 (six) hours as needed for moderate pain.   predniSONE 10 MG tablet Commonly known as: DELTASONE Take 2 tablets (20 mg total) by mouth daily for 10 days.       Verbal and written Discharge instructions given to the patient. Wound care per Discharge AVS  Follow-up Information     Marty Heck, MD Follow up in 2 week(s).   Specialty: Vascular Surgery Why: Office will call you to arrange your appt (sent) Contact information: [place] [address] [phone]         [facility] [place] Follow up.   Specialty: Home Health Services Why: Physical and Occupational Therapy-office to call with visit  times. Contact information: [address] [place] [address] [phone]                 Signed: Roxy Horseman 05/16/2022, 11:34 AM

## 2022-05-28 ENCOUNTER — Telehealth: Payer: Self-pay | Admitting: *Deleted

## 2022-05-28 NOTE — Telephone Encounter (Signed)
Occupational Therapist from [facility] called and wanted to let us know patient has a small opening on her abdomen. Patient has an appt for follow up tomorrow and they wanted [place] to evaluate opening. Provider will be notified.

## 2022-05-29 ENCOUNTER — Ambulatory Visit (INDEPENDENT_AMBULATORY_CARE_PROVIDER_SITE_OTHER): Payer: Commercial Managed Care - PPO | Admitting: Physician Assistant

## 2022-05-29 VITALS — BP 137/86 | HR 103 | Temp 97.8°F | Resp 18 | Ht 64.5 in | Wt 184.1 lb

## 2022-05-29 DIAGNOSIS — Z0279 Encounter for issue of other medical certificate: Secondary | ICD-10-CM

## 2022-05-29 DIAGNOSIS — K55059 Acute (reversible) ischemia of intestine, part and extent unspecified: Secondary | ICD-10-CM

## 2022-05-29 NOTE — Progress Notes (Signed)
POST OPERATIVE OFFICE NOTE    CC:  F/u for surgery  HPI:  [name] is a 56 y.o. female who is s/p percutaneous mechanical thrombectomy of SMA with angioplasty and stent to the proximal SMA by Dr. Carlis Abbott on 05/03/2022.  This was done to treat chronic mural thrombus in the visceral abdominal aorta with additional nearly occlusive thrombus in the proximal SMA.  Postoperatively, the patient was placed on anticoagulation and developed hemoperitoneum.  She was taken back to the OR on 05/04/2022 by general surgery for exploratory laparotomy with washout and hematoma evacuation.  Pt returns today for follow up.  Pt states she is been doing okay since surgery.  She is still having generalized abdominal pain, but much less than what it was during her hospitalization.  She is tolerating a normal diet without nausea or vomiting.  She states that it is still painful to have a bowel movement, however she is having about 1/day.  She fears that she may have an infection at her incision site, because a small area around her navel "opened up" and drained a small amount of thin brown fluid about 3 days ago.  She denies any erythema, bleeding, puslike discharge, fever or chills.  She is still taking her aspirin and Plavix without issues.   Allergies  Allergen Reactions   Penicillins Itching    Caused rash and blisters   Hydrocodone Hives and Itching    Current Outpatient Medications  Medication Sig Dispense Refill   aspirin EC 81 MG tablet Take 1 tablet (81 mg total) by mouth daily. Swallow whole. 30 tablet    atorvastatin (LIPITOR) 40 MG tablet Take 1 tablet (40 mg total) by mouth daily. 30 tablet 2   cholecalciferol (VITAMIN D3) 25 MCG (1000 UNIT) tablet Take 2,000 Units by mouth daily. Gummies     clopidogrel (PLAVIX) 75 MG tablet Take 1 tablet (75 mg total) by mouth daily with breakfast. 30 tablet 11   gabapentin (NEURONTIN) 300 MG capsule Take 1 capsule (300 mg total) by mouth at bedtime. 90  capsule 0   predniSONE (DELTASONE) 10 MG tablet Take 2 tablets (20 mg total) by mouth daily for 10 days. 20 tablet 0   albuterol (VENTOLIN HFA) 108 (90 Base) MCG/ACT inhaler Inhale 1-2 puffs into the lungs every 6 (six) hours as needed for wheezing or shortness of breath. 1 each 0   benzonatate (TESSALON) 100 MG capsule Take 1 capsule (100 mg total) by mouth every 8 (eight) hours. (Patient not taking: Reported on 05/29/2022) 21 capsule 0   oxyCODONE-acetaminophen (PERCOCET/ROXICET) 5-325 MG tablet Take 1 tablet by mouth every 6 (six) hours as needed for moderate pain. (Patient not taking: Reported on 05/29/2022) 30 tablet 0   No current facility-administered medications for this visit.     ROS:  See HPI  Physical Exam:  Incision: Midline abdominal incision healing well.  Small area of maceration around the navel with no signs of incisional dehiscence or infection.  Maceration is superficial with healthy tissue underneath.  Staples were removed without issue. Abdomen: Generalized mild abdominal tenderness, nondistended    Assessment/Plan:  This is a 56 y.o. female who is s/p: Thrombectomy and stenting of the proximal SMA on 05/03/2022, with exploratory laparotomy and hematoma evacuation on 05/04/2022   -The patient has been tolerating a normal diet without nausea or vomiting.  She has been having relatively normal bowel movements accompanied with generalized abdominal pain.  She is usually having about 1 bowel movement per day. -Her  abdominal incision has healed well with 1 small area of superficial maceration around the navel.  This area is without erythema or signs of infection.  It should heal well and can be kept clean daily.  Her staples were removed today without issue -The patient was encouraged to try MiraLAX and/or Dulcolax to help with bowel movement ease -She will continue aspirin and Plavix.  She will follow-up with Dr. Carlis Abbott in 2 weeks with mesenteric duplex   Vicente Serene,  PA-C [facility] [phone]   Clinic MD:  Roxanne Mins

## 2022-05-30 ENCOUNTER — Other Ambulatory Visit: Payer: Self-pay | Admitting: *Deleted

## 2022-05-30 DIAGNOSIS — K55059 Acute (reversible) ischemia of intestine, part and extent unspecified: Secondary | ICD-10-CM

## 2022-06-06 ENCOUNTER — Ambulatory Visit (HOSPITAL_COMMUNITY)
Admission: RE | Admit: 2022-06-06 | Discharge: 2022-06-06 | Disposition: A | Discharge status: Home / Self Care | Payer: Commercial Managed Care - PPO | Source: Ambulatory Visit | Attending: Vascular Surgery | Admitting: Vascular Surgery

## 2022-06-06 DIAGNOSIS — K55059 Acute (reversible) ischemia of intestine, part and extent unspecified: Secondary | ICD-10-CM | POA: Diagnosis present

## 2022-06-08 ENCOUNTER — Encounter (HOSPITAL_COMMUNITY): Payer: Commercial Managed Care - PPO

## 2022-06-08 NOTE — Progress Notes (Signed)
Initial neurology clinic note  SERVICE DATE: 06/15/21  Reason for Evaluation: Consultation requested by Criselda Peaches, DPM for an opinion regarding neuropathy. My final recommendations will be communicated back to the requesting physician by way of shared medical record or letter to requesting physician via [place] mail.  HPI: This is Ms. [name], a 56 y.o. right-handed female with a medical history of sarcoidosis, HLD, CAD c/b NSTEMI s/p stent, PVD c/b recent SMA thrombus s/p angioplasty and stent (04/2022), former smoker who presents to neurology clinic with the chief complaint of numbness and tingling in bilateral legs. The patient is accompanied by boyfriend.  Patient has numbness and tingling in bilateral feet to her knees. She thinks the right foot may be worse than the left. The inside of her feet will feel cold or hot. This has been present for at least 6 months. She mentions she can no longer walk in heels due to pain. She denies weakness in legs, imbalance, or falls. She sometimes has numbness/pain in the hands as well. Patient was seen at [facility] by Dr. Sherryle Lis. She was started on gabapentin 300 mg qhs. It helps her sleep and the pain at night. She finds that the next morning the gabapentin can leave her feeling tired and off balance. She has been on this for a few weeks.  She endorses cramps in her legs, particularly at night, that can wake her up.  She denies any constitutional symptoms like fever, night sweats, anorexia or unintentional weight loss.  EtOH use: Very rare Restrictive diet? No Family history of neuropathy/myopathy/neurologic disease? No  She has never had an EMG.  MEDICATIONS:  Outpatient Encounter Medications as of 06/15/2022  Medication Sig   aspirin EC 81 MG tablet Take 1 tablet (81 mg total) by mouth daily. Swallow whole.   atorvastatin (LIPITOR) 40 MG tablet Take 1 tablet (40 mg total) by mouth daily.   cholecalciferol (VITAMIN  D3) 25 MCG (1000 UNIT) tablet Take 2,000 Units by mouth daily. Gummies   clopidogrel (PLAVIX) 75 MG tablet Take 1 tablet (75 mg total) by mouth daily with breakfast.   gabapentin (NEURONTIN) 300 MG capsule Take 1 capsule (300 mg total) by mouth at bedtime.   benzonatate (TESSALON) 100 MG capsule Take 1 capsule (100 mg total) by mouth every 8 (eight) hours. (Patient not taking: Reported on 06/15/2022)   [DISCONTINUED] albuterol (VENTOLIN HFA) 108 (90 Base) MCG/ACT inhaler Inhale 1-2 puffs into the lungs every 6 (six) hours as needed for wheezing or shortness of breath.   [DISCONTINUED] oxyCODONE-acetaminophen (PERCOCET/ROXICET) 5-325 MG tablet Take 1 tablet by mouth every 6 (six) hours as needed for moderate pain. (Patient not taking: Reported on 05/29/2022)   [DISCONTINUED] predniSONE (DELTASONE) 10 MG tablet Take 2 tablets (20 mg total) by mouth daily for 10 days.   No facility-administered encounter medications on file as of 06/15/2022.    PAST MEDICAL HISTORY: Past Medical History:  Diagnosis Date   Anemia    Fibroid    Hyperlipidemia    NSTEMI (non-ST elevated myocardial infarction) ([facility]) 06/2017   s/p DES to LCX 07/02/17   Sarcoidosis     PAST SURGICAL HISTORY: Past Surgical History:  Procedure Laterality Date   ABDOMINAL HYSTERECTOMY     BREAST BIOPSY Right 03/24/2018   BREAST REDUCTION SURGERY Bilateral 10/17/2020   Procedure: MAMMARY REDUCTION  (BREAST);  Surgeon: Cindra Presume, MD;  Location: [facility];  Service: Plastics;  Laterality: Bilateral;  2 hours  CESAREAN SECTION  1987   CORONARY/GRAFT ACUTE MI REVASCULARIZATION N/A 06/30/2017   Procedure: Coronary/Graft Acute MI Revascularization;  Surgeon: Lorretta Harp, MD;  Location: [facility];  Service: Cardiovascular;  Laterality: N/A;   FOREARM FRACTURE SURGERY Right 1977   FRACTURE SURGERY     LAPAROSCOPY N/A 05/04/2022   Procedure: LAPAROSCOPY DIAGNOSTIC;  Surgeon: Ralene Ok, MD;  Location: [facility];  Service:  General;  Laterality: N/A;   LAPAROTOMY N/A 05/04/2022   Procedure: EXPLORATORY LAPAROTOMY;  Surgeon: Ralene Ok, MD;  Location: [facility];  Service: General;  Laterality: N/A;   LEFT HEART CATH AND CORONARY ANGIOGRAPHY N/A 06/30/2017   Procedure: LEFT HEART CATH AND CORONARY ANGIOGRAPHY;  Surgeon: Lorretta Harp, MD;  Location: [facility];  Service: Cardiovascular;  Laterality: N/A;   LEFT HEART CATH AND CORONARY ANGIOGRAPHY N/A 12/23/2017   Procedure: LEFT HEART CATH AND CORONARY ANGIOGRAPHY;  Surgeon: Lorretta Harp, MD;  Location: [facility];  Service: Cardiovascular;  Laterality: N/A;   TUBAL LIGATION  1990    ALLERGIES: Allergies  Allergen Reactions   Penicillins Itching    Caused rash and blisters   Hydrocodone Hives and Itching    FAMILY HISTORY: Family History  Problem Relation Age of Onset   Diabetes Mother    Stroke Mother 61   Hypertension Mother    Hyperlipidemia Father    Stroke Father 8   Hypertension Father    Breast cancer Paternal Grandmother     SOCIAL HISTORY: Social History   Tobacco Use   Smoking status: Some Days    Packs/day: 1.00    Years: 29.00    Total pack years: 29.00    Types: Cigarettes    Last attempt to quit: 03/19/2018    Years since quitting: 4.2   Smokeless tobacco: Never   Tobacco comments:    11/09/2021 Patient smokes some days  started back in 2023  Vaping Use   Vaping Use: Never used  Substance Use Topics   Alcohol use: Not Currently    Comment: /no   Drug use: Never   Social History   Social History Narrative   Are you right handed or left handed? right   Are you currently employed ?    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 befriend   Who lives with you?    What type of home do you live in: 1 story or 2 story? two         OBJECTIVE: PHYSICAL EXAM: BP 133/86   Pulse (!) 103   Ht 5' 4.5" (1.638 m)   Wt 185 lb (83.9 kg)   SpO2 100%    BMI 31.26 kg/m   General: General appearance: Awake and alert. No distress. Cooperative with exam.  Skin: No obvious rash or jaundice. HEENT: Atraumatic. Anicteric. Lungs: Non-labored breathing on room air  Abdomen: Pain due to recent blood clot and procedure Extremities: No edema. No obvious deformity.  Musculoskeletal: No obvious joint swelling. Psych: Affect appropriate.  Neurological: Mental Status: Alert. Speech fluent. No pseudobulbar affect Cranial Nerves: CNII: No RAPD. Visual fields grossly intact. CNIII, IV, VI: PERRL. No nystagmus. EOMI. CN V: Facial sensation intact bilaterally to fine touch. CN VII: Facial muscles symmetric and strong. No ptosis at rest. CN VIII: Hearing grossly intact bilaterally. CN IX: No hypophonia. CN X: Palate elevates symmetrically. CN XI: Full strength shoulder shrug bilaterally. CN XII:  Tongue protrusion full and midline. No atrophy or fasciculations. No significant dysarthria Motor: Tone is normal. 5/5 in bilateral upper and lower extremities Reflexes:  Right Left   Bicep 2+ 2+   Tricep 2+ 2+   BrRad 2+ 2+   Knee 2+ 2+   Ankle 2+ 2+    Pathological Reflexes: Babinski: flexor response bilaterally Hoffman: absent bilaterally Troemner: absent bilaterally Sensation: Pinprick: Diminished to 25% of normal in feet with distal to proximal gradient Vibration: Intact in upper extremities, 7 seconds in bilateral lower extremities Proprioception: Intact in bilateral great toes Coordination: Intact finger-to- nose-finger bilaterally. Romberg mild sway. Gait: Normal, narrow-based gait.  Lab and Test Review: Internal labs: BMP (05/04/22) significant for elevated glucose to 191 B12 (04/12/22): 1241 Folate (04/12/22): wnl ANA (04/12/22): negative ESR (04/12/22): wnl  Imaging: MRI lumbar spine wo contrast (03/01/21): FINDINGS: Segmentation:  Standard.   Alignment:  Physiologic.   Vertebrae: No fracture, evidence of discitis, or  suspicious bone lesion. There are a few scattered small intraosseous hemangiomas incidentally noted.   Conus medullaris and cauda equina: Conus extends to the L1 level. Conus and cauda equina appear normal.   Paraspinal and other soft tissues: Negative.   Disc levels:   T12-L1: No significant disc protrusion, foraminal stenosis, or canal stenosis.   L1-L2: No significant disc protrusion, foraminal stenosis, or canal stenosis.   L2-L3: Minimal annular disc bulge. Mild bilateral facet arthropathy. No foraminal or canal stenosis.   L3-L4: Diffuse disc bulge with left paracentral/foraminal protrusion. Slight cranial extension of disc material within the subarticular zone. Mild bilateral facet arthropathy. Findings result in mild canal stenosis with mild left subarticular recess stenosis and borderline-mild left foraminal stenosis.   L4-L5: No disc protrusion. Mild bilateral facet arthropathy. No foraminal or canal stenosis.   L5-S1: Shallow central disc protrusion. Mild bilateral facet arthropathy. No foraminal or canal stenosis.   IMPRESSION: Mild degenerative changes of the lumbar spine, as described above. Findings are most pronounced at L3-L4 where there is mild canal stenosis, mild left subarticular recess stenosis, and borderline-mild left foraminal stenosis.  ASSESSMENT: [name] is a 56 y.o. female who presents for evaluation of numbness and tingling in bilateral lower extremities. She has a relevant medical history of sarcoidosis, HLD, CAD c/b NSTEMI s/p stent, PVD c/b recent SMA thrombus s/p angioplasty and stent (04/2022), former smoker. Her neurological examination is pertinent for diminished distal sensation in lower extremities. Available diagnostic data is significant for B12 of 1241, ANA and ESR wnl. Patient's symptoms are most consistent with a distal symmetric polyneuropathy. I will get an EMG and look for treatable causes as below.  PLAN: -Blood work:  HbA1c, IFE, B1 -EMG: PN protocol (R > L) -Gabapentin 300 mg qhs -Lidocaine cream PRN   -Return to clinic in 6 months  The impression above as well as the plan as outlined below were extensively discussed with the patient (in the company of husband) who voiced understanding. All questions were answered to their satisfaction.  When available, results of the above investigations and possible further recommendations will be communicated to the patient via telephone/MyChart. Patient to call office if not contacted after expected testing turnaround time.   Total time spent reviewing records, interview, history/exam, documentation, and coordination of care on day of encounter:  45 min   Thank you for allowing me to participate in patient's care.  If I can answer any additional questions, I would be pleased to do so.  Kai Levins, MD   CC: Tamala Julian,  [address], [place] [address]  CC: Referring provider: Criselda Peaches, [address] [place],  [place] [address]

## 2022-06-12 ENCOUNTER — Ambulatory Visit: Payer: Commercial Managed Care - PPO | Admitting: Vascular Surgery

## 2022-06-12 ENCOUNTER — Encounter: Payer: Self-pay | Admitting: Vascular Surgery

## 2022-06-12 VITALS — BP 138/103 | HR 88 | Temp 97.7°F | Ht 64.0 in | Wt 184.0 lb

## 2022-06-12 DIAGNOSIS — K55059 Acute (reversible) ischemia of intestine, part and extent unspecified: Secondary | ICD-10-CM

## 2022-06-12 NOTE — Progress Notes (Signed)
Patient name: [name] MRN: [id number] DOB: Dec 15, 1965 Sex: female  REASON FOR CONSULT: Hospital follow-up  HPI: [name] is a 57 y.o. female, that presents for ongoing hospital follow-up.  She recently underwent percutaneous mechanical thrombectomy of the SMA with angioplasty and stenting on 05/03/2022 for acute mesenteric ischemia with SMA thrombus.  She did require ex lap with all viable bowel but had evidence of hemoperitoneum with no bleeding source identified by general surgery. She had a slow hospital course but ultimately was discharged home on aspirin and Plavix.   Today she states that she is not having any worsening abdominal pain when she eats or drinks.  No nausea or vomiting.  Her bowels are moving.  She feels she is making slow progress.  Still having ongoing abdominal pain and feels fatigued.  Past Medical History:  Diagnosis Date   Anemia    Fibroid    Hyperlipidemia    NSTEMI (non-ST elevated myocardial infarction) ([facility]) 06/2017   s/p DES to LCX 07/02/17   Sarcoidosis     Past Surgical History:  Procedure Laterality Date   ABDOMINAL HYSTERECTOMY     BREAST BIOPSY Right 03/24/2018   BREAST REDUCTION SURGERY Bilateral 10/17/2020   Procedure: MAMMARY REDUCTION  (BREAST);  Surgeon: Cindra Presume, MD;  Location: [facility];  Service: Plastics;  Laterality: Bilateral;  2 hours   CESAREAN SECTION  1987   CORONARY/GRAFT ACUTE MI REVASCULARIZATION N/A 06/30/2017   Procedure: Coronary/Graft Acute MI Revascularization;  Surgeon: Lorretta Harp, MD;  Location: [facility];  Service: Cardiovascular;  Laterality: N/A;   FOREARM FRACTURE SURGERY Right 1977   FRACTURE SURGERY     LAPAROSCOPY N/A 05/04/2022   Procedure: LAPAROSCOPY DIAGNOSTIC;  Surgeon: Ralene Ok, MD;  Location: [facility];  Service: General;  Laterality: N/A;   LAPAROTOMY N/A 05/04/2022   Procedure: EXPLORATORY LAPAROTOMY;  Surgeon: Ralene Ok, MD;  Location: [facility];  Service: General;   Laterality: N/A;   LEFT HEART CATH AND CORONARY ANGIOGRAPHY N/A 06/30/2017   Procedure: LEFT HEART CATH AND CORONARY ANGIOGRAPHY;  Surgeon: Lorretta Harp, MD;  Location: [facility];  Service: Cardiovascular;  Laterality: N/A;   LEFT HEART CATH AND CORONARY ANGIOGRAPHY N/A 12/23/2017   Procedure: LEFT HEART CATH AND CORONARY ANGIOGRAPHY;  Surgeon: Lorretta Harp, MD;  Location: [facility];  Service: Cardiovascular;  Laterality: N/A;   TUBAL LIGATION  1990    Family History  Problem Relation Age of Onset   Diabetes Mother    Stroke Mother 87   Hypertension Mother    Hyperlipidemia Father    Stroke Father 56   Hypertension Father    Breast cancer Paternal Grandmother     SOCIAL HISTORY: Social History   Socioeconomic History   Marital status: Widowed    Spouse name: Not on file   Number of children: 2   Years of education: Not on file   Highest education level: Not on file  Occupational History   Occupation: Scientist, forensic     Employer: Doctor, general practice  Tobacco Use   Smoking status: Some Days    Packs/day: 1.00    Years: 29.00    Total pack years: 29.00    Types: Cigarettes    Last attempt to quit: 03/19/2018    Years since quitting: 4.2   Smokeless tobacco: Never   Tobacco comments:    11/09/2021 Patient smokes some days  started back in 2023  Vaping Use  Vaping Use: Never used  Substance and Sexual Activity   Alcohol use: Yes    Comment: 12/20/2017 "couple drinks once/yr"   Drug use: Never   Sexual activity: Not on file  Other Topics Concern   Not on file  Social History Narrative   Not on file   Social Determinants of Health   Financial Resource Strain: Not on file  Food Insecurity: No Food Insecurity (05/04/2022)   Hunger Vital Sign    Worried About Running Out of Food in the Last Year: Never true    Ran Out of Food in the Last Year: Never true  Transportation Needs: No Transportation Needs (05/04/2022)   PRAPARE -  Hydrologist (Medical): No    Lack of Transportation (Non-Medical): No  Physical Activity: Not on file  Stress: Not on file  Social Connections: Not on file  Intimate Partner Violence: Not At Risk (05/04/2022)   Humiliation, Afraid, Rape, and Kick questionnaire    Fear of Current or Ex-Partner: No    Emotionally Abused: No    Physically Abused: No    Sexually Abused: No    Allergies  Allergen Reactions   Penicillins Itching    Caused rash and blisters   Hydrocodone Hives and Itching    Current Outpatient Medications  Medication Sig Dispense Refill   aspirin EC 81 MG tablet Take 1 tablet (81 mg total) by mouth daily. Swallow whole. 30 tablet    atorvastatin (LIPITOR) 40 MG tablet Take 1 tablet (40 mg total) by mouth daily. 30 tablet 2   benzonatate (TESSALON) 100 MG capsule Take 1 capsule (100 mg total) by mouth every 8 (eight) hours. 21 capsule 0   cholecalciferol (VITAMIN D3) 25 MCG (1000 UNIT) tablet Take 2,000 Units by mouth daily. Gummies     clopidogrel (PLAVIX) 75 MG tablet Take 1 tablet (75 mg total) by mouth daily with breakfast. 30 tablet 11   gabapentin (NEURONTIN) 300 MG capsule Take 1 capsule (300 mg total) by mouth at bedtime. 90 capsule 0   No current facility-administered medications for this visit.    REVIEW OF SYSTEMS:  '[X]'$  denotes positive finding, '[ ]'$  denotes negative finding Cardiac  Comments:  Chest pain or chest pressure:    Shortness of breath upon exertion:    Short of breath when lying flat:    Irregular heart rhythm:        Vascular    Pain in calf, thigh, or hip brought on by ambulation:    Pain in feet at night that wakes you up from your sleep:     Blood clot in your veins:    Leg swelling:         Pulmonary    Oxygen at home:    Productive cough:     Wheezing:         Neurologic    Sudden weakness in arms or legs:     Sudden numbness in arms or legs:     Sudden onset of difficulty speaking or slurred  speech:    Temporary loss of vision in one eye:     Problems with dizziness:         Gastrointestinal    Blood in stool:     Vomited blood:         Genitourinary    Burning when urinating:     Blood in urine:        Psychiatric    Major depression:  Hematologic    Bleeding problems:    Problems with blood clotting too easily:        Skin    Rashes or ulcers:        Constitutional    Fever or chills:      PHYSICAL EXAM: Vitals:   06/12/22 1051  BP: (!) 138/103  Pulse: 88  Temp: 97.7 F (36.5 C)  TempSrc: Temporal  SpO2: 99%  Weight: 184 lb (83.5 kg)  Height: '5\' 4"'$  (1.626 m)    GENERAL: The patient is a well-nourished female, in no acute distress. The vital signs are documented above. CARDIAC: There is a regular rate and rhythm.  PULMONARY: No respiratory distress. ABDOMEN: Midline incision healed.  No rebound or guarding.  No peritonitis. MUSCULOSKELETAL: There are no major deformities or cyanosis. NEUROLOGIC: No focal weakness or paresthesias are detected. PSYCHIATRIC: The patient has a normal affect.  DATA:   Mesenteric Duplex shows her SMA appears patent without flow limiting stenosis.  Assessment/Plan:  57 y.o. female, that presents for hospital follow-up after recent percutaneous mechanical thrombectomy of the SMA with angioplasty and stenting on 05/03/2022 for acute mesenteric ischemia with SMA thrombus.  She did require laparotomy by general surgery on 05/04/2022 with findings of hemoperitoneum with no bleeding source found.  She feels her progress has been very slow.  She is tolerating a diet and having bowel function with no increasing pain after eating.  That being said, given ongoing baseline abdominal pain since hospital discharge I have recommended a CTA abdomen pelvis given her slow progress.  Discussed if she has any worsening symptoms between now and the CT she needs to present to the ED for further evaluation.  I will have her see me after  CT is complete.  The SMA stent looks patent on duplex here in the office.  I've recommend she continue aspirin and plavix.     Marty Heck, MD [facility] Office: [phone]

## 2022-06-15 ENCOUNTER — Ambulatory Visit: Payer: Commercial Managed Care - PPO | Admitting: Neurology

## 2022-06-15 ENCOUNTER — Other Ambulatory Visit: Payer: Self-pay

## 2022-06-15 ENCOUNTER — Other Ambulatory Visit (INDEPENDENT_AMBULATORY_CARE_PROVIDER_SITE_OTHER): Payer: Commercial Managed Care - PPO

## 2022-06-15 ENCOUNTER — Encounter: Payer: Self-pay | Admitting: Neurology

## 2022-06-15 ENCOUNTER — Telehealth: Payer: Self-pay | Admitting: Neurology

## 2022-06-15 VITALS — BP 133/86 | HR 103 | Ht 64.5 in | Wt 185.0 lb

## 2022-06-15 DIAGNOSIS — Z131 Encounter for screening for diabetes mellitus: Secondary | ICD-10-CM

## 2022-06-15 DIAGNOSIS — R7301 Impaired fasting glucose: Secondary | ICD-10-CM

## 2022-06-15 DIAGNOSIS — R202 Paresthesia of skin: Secondary | ICD-10-CM

## 2022-06-15 DIAGNOSIS — K55059 Acute (reversible) ischemia of intestine, part and extent unspecified: Secondary | ICD-10-CM

## 2022-06-15 DIAGNOSIS — R200 Anesthesia of skin: Secondary | ICD-10-CM | POA: Diagnosis not present

## 2022-06-15 NOTE — Patient Instructions (Addendum)
The cause of the numbness and tingling in your legs is likely nerve damage (neuropathy). I want to do blood work today to look for treatable causes.   I would like to do a muscle and nerve test called an EMG (see more information below).  I will be in touch when I have your results.  For your symptoms, continue gabapentin 300 mg at bedtime. You will likely have less side effects the longer you take it. You can also try Lidocaine cream as needed. Wear gloves to prevent your hands being numb. This can be bought over the counter at any drug store or online.  - Recommend the following measures that may provide some symptomatic benefit for muscle cramps: - Adequate oral clear fluid intake to maintain optimal hydration (about 2.5 liters, or around 8-10 glasses per day) Avoidance of caffeine Trial of DIET tonic water: About 1 glass, up to 6 times daily Magnesium oxide up to 400 mg by mouth twice daily, as needed (over the counter) Gentle muscle stretching routine, especially before bedtime   I would like to see you back in clinic in 6 months. Please let me know if you have any questions or concerns in the meantime.   The physicians and staff at [facility] are committed to providing excellent care. You may receive a survey requesting feedback about your experience at our office. We strive to receive "very good" responses to the survey questions. If you feel that your experience would prevent you from giving the office a "very good " response, please contact our office to try to remedy the situation. We may be reached at [phone]. Thank you for taking the time out of your busy day to complete the survey.  Kai Levins, MD [facility]  ELECTROMYOGRAM AND NERVE CONDUCTION STUDIES (EMG/NCS) INSTRUCTIONS  How to Prepare The neurologist conducting the EMG will need to know if you have certain medical conditions. Tell the neurologist and other EMG lab personnel if you: Have a pacemaker or  any other electrical medical device Take blood-thinning medications Have hemophilia, a blood-clotting disorder that causes prolonged bleeding Bathing Take a shower or bath shortly before your exam in order to remove oils from your skin. Don't apply lotions or creams before the exam.  What to Expect You'll likely be asked to change into a hospital gown for the procedure and lie down on an examination table. The following explanations can help you understand what will happen during the exam.  Electrodes. The neurologist or a technician places surface electrodes at various locations on your skin depending on where you're experiencing symptoms. Or the neurologist may insert needle electrodes at different sites depending on your symptoms.  Sensations. The electrodes will at times transmit a tiny electrical current that you may feel as a twinge or spasm. The needle electrode may cause discomfort or pain that usually ends shortly after the needle is removed. If you are concerned about discomfort or pain, you may want to talk to the neurologist about taking a short break during the exam.  Instructions. During the needle EMG, the neurologist will assess whether there is any spontaneous electrical activity when the muscle is at rest - activity that isn't present in healthy muscle tissue - and the degree of activity when you slightly contract the muscle.  He or she will give you instructions on resting and contracting a muscle at appropriate times. Depending on what muscles and nerves the neurologist is examining, he or she may ask you to  change positions during the exam.  After your EMG You may experience some temporary, minor bruising where the needle electrode was inserted into your muscle. This bruising should fade within several days. If it persists, contact your primary care doctor.

## 2022-06-15 NOTE — Telephone Encounter (Signed)
-----   Message from Renae Gloss, LPN sent at 08/11/3555  3:52 PM EST ----- Regarding: FW: LABS I put the new order in but I will let you call patient .  ----- Message ----- From: Gus Rankin Sent: 06/15/2022   3:43 PM EST To: Renae Gloss, LPN Subject: LABS                                           This patient came to the lab today and I accidentally drew the wrong tube for her A1c, Will you call her to come back to the lab and have the order put back in please/ Thanks, Adah Salvage

## 2022-06-15 NOTE — Addendum Note (Signed)
Addended by: Adah Salvage F on: 06/15/2022 04:11 PM   Modules accepted: Orders

## 2022-06-15 NOTE — Addendum Note (Signed)
Addended by: Renae Gloss on: 06/15/2022 03:52 PM   Modules accepted: Orders

## 2022-06-21 LAB — IMMUNOFIXATION ELECTROPHORESIS
IgG (Immunoglobin G), Serum: 1229 mg/dL (ref 600–1640)
IgM, Serum: 80 mg/dL (ref 50–300)
Immunoglobulin A: 52 mg/dL (ref 47–310)

## 2022-06-21 LAB — VITAMIN B1: Vitamin B1 (Thiamine): 6 nmol/L — ABNORMAL LOW (ref 8–30)

## 2022-06-22 ENCOUNTER — Ambulatory Visit (HOSPITAL_COMMUNITY)
Admission: RE | Admit: 2022-06-22 | Discharge: 2022-06-22 | Disposition: A | Discharge status: Home / Self Care | Payer: Commercial Managed Care - PPO | Source: Ambulatory Visit | Attending: Vascular Surgery | Admitting: Vascular Surgery

## 2022-06-22 DIAGNOSIS — K55059 Acute (reversible) ischemia of intestine, part and extent unspecified: Secondary | ICD-10-CM | POA: Diagnosis present

## 2022-06-22 MED ORDER — IOHEXOL 350 MG/ML SOLN
100.0000 mL | Freq: Once | INTRAVENOUS | Status: AC | PRN
  Administered 2022-06-22: 100 mL via INTRAVENOUS

## 2022-06-23 ENCOUNTER — Telehealth (INDEPENDENT_AMBULATORY_CARE_PROVIDER_SITE_OTHER): Payer: Commercial Managed Care - PPO | Admitting: Vascular Surgery

## 2022-06-23 DIAGNOSIS — Z48812 Encounter for surgical aftercare following surgery on the circulatory system: Secondary | ICD-10-CM

## 2022-06-23 MED ORDER — APIXABAN (ELIQUIS) VTE STARTER PACK (10MG AND 5MG): ORAL_TABLET | ORAL | 0 refills | Status: DC

## 2022-06-23 NOTE — Telephone Encounter (Signed)
Called and discussed CT findings with patient including thrombus in recent SMA stent placed for acute mesenteric ischemia.  She has chronic thrombus her her descending thoracic and visceral abdominal aorta in this segment as demonstrated on CT from admission over Thanksgiving. States she is still able to eat and having bowel function.  Some ongoing chronic pain in her abdomen.  Discussed plan to start full anticoagulation.  Eliquis starter pack sent to pharmacy.  Will see on Tuesday in the office to discuss next steps.  Marty Heck, MD [facility] Office: [place]

## 2022-06-25 ENCOUNTER — Telehealth: Payer: Self-pay

## 2022-06-25 NOTE — Telephone Encounter (Signed)
Olivia Mackie at [facility] called to ensure CT results are visible in Epic.  Returned call, reviewed chart with Olivia Mackie, confirmed results in [place].

## 2022-06-26 ENCOUNTER — Other Ambulatory Visit: Payer: Self-pay

## 2022-06-26 ENCOUNTER — Encounter: Payer: Self-pay | Admitting: Vascular Surgery

## 2022-06-26 ENCOUNTER — Ambulatory Visit: Payer: Commercial Managed Care - PPO | Admitting: Vascular Surgery

## 2022-06-26 VITALS — BP 142/88 | HR 84 | Temp 97.8°F | Resp 16 | Ht 64.5 in | Wt 191.0 lb

## 2022-06-26 DIAGNOSIS — K55059 Acute (reversible) ischemia of intestine, part and extent unspecified: Secondary | ICD-10-CM | POA: Diagnosis not present

## 2022-06-26 NOTE — Progress Notes (Signed)
Patient name: [name] MRN: [id number] DOB: 05/09/1966 Sex: female  REASON FOR CONSULT: Hospital follow-up after CTA abdomen/pelvis (history of SMA stent for acute mesenteric ischemia)  HPI: [name] is a 57 y.o. female, that presents for ongoing hospital follow-up.  She recently underwent percutaneous mechanical thrombectomy of the SMA with angioplasty and stenting on 05/03/2022 for acute mesenteric ischemia with SMA thrombus.  She did require ex lap with all viable bowel but had evidence of hemoperitoneum with no bleeding source identified by general surgery. She had a slow hospital course but ultimately was discharged home on aspirin and Plavix.     She has made very slow progress at home.  I subsequently sent her for CTA abdomen pelvis on the last clinic visit.  She is still having pain in her abdomen that comes and goes.  States she is able to eat small portions but gets full very quickly.  I did start her on Eliquis this weekend after I saw her CT scan with evidence of thrombus in her mesenteric stent.  She states she took her first dose last night.  Past Medical History:  Diagnosis Date   Anemia    Fibroid    Hyperlipidemia    NSTEMI (non-ST elevated myocardial infarction) ([place]) 06/2017   s/p DES to LCX 07/02/17   Sarcoidosis     Past Surgical History:  Procedure Laterality Date   ABDOMINAL HYSTERECTOMY     BREAST BIOPSY Right 03/24/2018   BREAST REDUCTION SURGERY Bilateral 10/17/2020   Procedure: MAMMARY REDUCTION  (BREAST);  Surgeon: Cindra Presume, MD;  Location: [facility];  Service: Plastics;  Laterality: Bilateral;  2 hours   CESAREAN SECTION  1987   CORONARY/GRAFT ACUTE MI REVASCULARIZATION N/A 06/30/2017   Procedure: Coronary/Graft Acute MI Revascularization;  Surgeon: Lorretta Harp, MD;  Location: [facility];  Service: Cardiovascular;  Laterality: N/A;   FOREARM FRACTURE SURGERY Right 1977   FRACTURE SURGERY     LAPAROSCOPY N/A 05/04/2022    Procedure: LAPAROSCOPY DIAGNOSTIC;  Surgeon: Ralene Ok, MD;  Location: [facility];  Service: General;  Laterality: N/A;   LAPAROTOMY N/A 05/04/2022   Procedure: EXPLORATORY LAPAROTOMY;  Surgeon: Ralene Ok, MD;  Location: [facility];  Service: General;  Laterality: N/A;   LEFT HEART CATH AND CORONARY ANGIOGRAPHY N/A 06/30/2017   Procedure: LEFT HEART CATH AND CORONARY ANGIOGRAPHY;  Surgeon: Lorretta Harp, MD;  Location: [facility];  Service: Cardiovascular;  Laterality: N/A;   LEFT HEART CATH AND CORONARY ANGIOGRAPHY N/A 12/23/2017   Procedure: LEFT HEART CATH AND CORONARY ANGIOGRAPHY;  Surgeon: Lorretta Harp, MD;  Location: [facility];  Service: Cardiovascular;  Laterality: N/A;   TUBAL LIGATION  1990    Family History  Problem Relation Age of Onset   Diabetes Mother    Stroke Mother 86   Hypertension Mother    Hyperlipidemia Father    Stroke Father 46   Hypertension Father    Breast cancer Paternal Grandmother     SOCIAL HISTORY: Social History   Socioeconomic History   Marital status: Widowed    Spouse name: Not on file   Number of children: 2   Years of education: Not on file   Highest education level: Not on file  Occupational History   Occupation: Scientist, forensic     Employer: Doctor, general practice  Tobacco Use   Smoking status: Some Days    Packs/day: 1.00    Years: 29.00  Total pack years: 29.00    Types: Cigarettes    Last attempt to quit: 03/19/2018    Years since quitting: 4.2   Smokeless tobacco: Never   Tobacco comments:    11/09/2021 Patient smokes some days  started back in 2023  Vaping Use   Vaping Use: Never used  Substance and Sexual Activity   Alcohol use: Not Currently    Comment: /no   Drug use: Never   Sexual activity: Not on file  Other Topics Concern   Not on file  Social History Narrative   Are you right handed or left handed? right   Are you currently employed ?    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 befriend   Who lives with you?    What type of home do you live in: 1 story or 2 story? two       Social Determinants of Health   Financial Resource Strain: Not on file  Food Insecurity: No Food Insecurity (05/04/2022)   Hunger Vital Sign    Worried About Running Out of Food in the Last Year: Never true    Ran Out of Food in the Last Year: Never true  Transportation Needs: No Transportation Needs (05/04/2022)   PRAPARE - Hydrologist (Medical): No    Lack of Transportation (Non-Medical): No  Physical Activity: Not on file  Stress: Not on file  Social Connections: Not on file  Intimate Partner Violence: Not At Risk (05/04/2022)   Humiliation, Afraid, Rape, and Kick questionnaire    Fear of Current or Ex-Partner: No    Emotionally Abused: No    Physically Abused: No    Sexually Abused: No    Allergies  Allergen Reactions   Penicillins Itching    Caused rash and blisters   Hydrocodone Hives and Itching    Current Outpatient Medications  Medication Sig Dispense Refill   APIXABAN (ELIQUIS) VTE STARTER PACK ('10MG'$  AND '5MG'$ ) Take as directed on package: start with two-'5mg'$  tablets twice daily for 7 days. On day 8, switch to one-'5mg'$  tablet twice daily. 1 each 0   aspirin EC 81 MG tablet Take 1 tablet (81 mg total) by mouth daily. Swallow whole. 30 tablet    atorvastatin (LIPITOR) 40 MG tablet Take 1 tablet (40 mg total) by mouth daily. 30 tablet 2   benzonatate (TESSALON) 100 MG capsule Take 1 capsule (100 mg total) by mouth every 8 (eight) hours. (Patient not taking: Reported on 06/15/2022) 21 capsule 0   cholecalciferol (VITAMIN D3) 25 MCG (1000 UNIT) tablet Take 2,000 Units by mouth daily. Gummies     clopidogrel (PLAVIX) 75 MG tablet Take 1 tablet (75 mg total) by mouth daily with breakfast. 30 tablet 11   gabapentin (NEURONTIN) 300 MG capsule Take 1 capsule (300 mg total) by mouth at bedtime. 90 capsule  0   No current facility-administered medications for this visit.    REVIEW OF SYSTEMS:  '[X]'$  denotes positive finding, '[ ]'$  denotes negative finding Cardiac  Comments:  Chest pain or chest pressure:    Shortness of breath upon exertion:    Short of breath when lying flat:    Irregular heart rhythm:        Vascular    Pain in calf, thigh, or hip brought on by ambulation:    Pain in feet at night that wakes you up from your sleep:  Blood clot in your veins:    Leg swelling:         Pulmonary    Oxygen at home:    Productive cough:     Wheezing:         Neurologic    Sudden weakness in arms or legs:     Sudden numbness in arms or legs:     Sudden onset of difficulty speaking or slurred speech:    Temporary loss of vision in one eye:     Problems with dizziness:         Gastrointestinal    Blood in stool:     Vomited blood:         Genitourinary    Burning when urinating:     Blood in urine:        Psychiatric    Major depression:         Hematologic    Bleeding problems:    Problems with blood clotting too easily:        Skin    Rashes or ulcers:        Constitutional    Fever or chills:      PHYSICAL EXAM: Vitals:   06/26/22 1048  BP: (!) 142/88  Pulse: 84  Resp: 16  Temp: 97.8 F (36.6 C)  TempSrc: Temporal  SpO2: 98%  Weight: 191 lb (86.6 kg)  Height: 5' 4.5" (1.638 m)    GENERAL: The patient is a well-nourished female, in no acute distress. The vital signs are documented above. CARDIAC: There is a regular rate and rhythm.  PULMONARY: No respiratory distress. ABDOMEN: Midline incision healed.  No rebound or guarding.  No peritonitis. MUSCULOSKELETAL: There are no major deformities or cyanosis. NEUROLOGIC: No focal weakness or paresthesias are detected. PSYCHIATRIC: The patient has a normal affect.  DATA:   CTA abdomen pelvis reviewed from 06/22/22 that shows thrombus in her recently placed SMA stent for acute mesenteric ischemia likely an  extension of the chronic thrombus in her descending thoracic and abdominal visceral aorta.  Assessment/Plan:  57 y.o. female that presents for follow-up after CTA abdomen pelvis for further evaluation of her ongoing abdominal pain and slow progress after percutaneous mechanical thrombectomy of the SMA with angioplasty and stenting on 05/03/2022 for acute mesenteric ischemia with SMA thrombus.  She did require laparotomy by general surgery on 05/04/2022 with findings of hemoperitoneum with no bleeding source found.  Discussed there is thrombus in the proximal SMA stent likely high-grade stenosis as an extension from chronic mural thrombus in her descending thoracic and abdominal visceral aorta.  I have recommended proceeding back to the Cath Lab for percutaneous thrombectomy and likely additional stenting of her SMA further into the aorta.  I think she would have a hostile abdomen at this time and discussed the other option would be mesenteric bypass.  I discussed risk of thromboembolism from manipulation of this thrombus from intervention with risk of bowel ischemia as well as other risks in the Cath Lab including vessel injury bleeding etc.  She wishes to proceed.  I have asked that she stay on her Eliquis until about 48 hours prior to her procedure and then hold.  She can continue aspirin Plavix.   Marty Heck, MD [facility] Office: [phone]

## 2022-07-05 ENCOUNTER — Encounter (HOSPITAL_COMMUNITY)
Admission: RE | Disposition: A | Discharge status: Home / Self Care | Payer: Self-pay | Source: Ambulatory Visit | Attending: Vascular Surgery

## 2022-07-05 ENCOUNTER — Ambulatory Visit (HOSPITAL_COMMUNITY)
Admission: RE | Admit: 2022-07-05 | Discharge: 2022-07-05 | Disposition: A | Discharge status: Home / Self Care | Payer: Commercial Managed Care - PPO | Source: Ambulatory Visit | Attending: Vascular Surgery | Admitting: Vascular Surgery

## 2022-07-05 ENCOUNTER — Encounter (HOSPITAL_COMMUNITY): Payer: Self-pay | Admitting: Vascular Surgery

## 2022-07-05 DIAGNOSIS — K55059 Acute (reversible) ischemia of intestine, part and extent unspecified: Secondary | ICD-10-CM

## 2022-07-05 DIAGNOSIS — Z7982 Long term (current) use of aspirin: Secondary | ICD-10-CM | POA: Diagnosis not present

## 2022-07-05 DIAGNOSIS — Z7902 Long term (current) use of antithrombotics/antiplatelets: Secondary | ICD-10-CM | POA: Insufficient documentation

## 2022-07-05 DIAGNOSIS — K551 Chronic vascular disorders of intestine: Secondary | ICD-10-CM

## 2022-07-05 HISTORY — PX: PERIPHERAL VASCULAR THROMBECTOMY: CATH118306

## 2022-07-05 HISTORY — PX: PERIPHERAL VASCULAR INTERVENTION: CATH118257

## 2022-07-05 HISTORY — PX: ABDOMINAL AORTOGRAM: CATH118222

## 2022-07-05 LAB — POCT ACTIVATED CLOTTING TIME
Activated Clotting Time: 255 seconds
Activated Clotting Time: 195 seconds
Activated Clotting Time: 179 seconds

## 2022-07-05 LAB — POCT I-STAT, CHEM 8
BUN: 7 mg/dL (ref 6–20)
Calcium, Ion: 1.39 mmol/L (ref 1.15–1.40)
Chloride: 104 mmol/L (ref 98–111)
Creatinine, Ser: 0.6 mg/dL (ref 0.44–1.00)
Glucose, Bld: 96 mg/dL (ref 70–99)
HCT: 40 % (ref 36.0–46.0)
Hemoglobin: 13.6 g/dL (ref 12.0–15.0)
Potassium: 3.8 mmol/L (ref 3.5–5.1)
Sodium: 142 mmol/L (ref 135–145)
TCO2: 23 mmol/L (ref 22–32)

## 2022-07-05 SURGERY — PERIPHERAL VASCULAR THROMBECTOMY
Anesthesia: LOCAL

## 2022-07-05 MED ORDER — SODIUM CHLORIDE 0.9 % IV SOLN: 25.0000 mg | INTRAVENOUS | Status: DC

## 2022-07-05 MED ORDER — HEPARIN SODIUM (PORCINE) 1000 UNIT/ML IJ SOLN
INTRAMUSCULAR | Status: AC
  Filled 2022-07-05: qty 10

## 2022-07-05 MED ORDER — LIDOCAINE HCL (PF) 1 % IJ SOLN
INTRAMUSCULAR | Status: DC | PRN
  Administered 2022-07-05: 10 mL

## 2022-07-05 MED ORDER — SODIUM CHLORIDE 0.9 % IV SOLN
25.0000 mg | Freq: Once | INTRAVENOUS | Status: DC
  Filled 2022-07-05: qty 1

## 2022-07-05 MED ORDER — ONDANSETRON HCL 4 MG/2ML IJ SOLN
4.0000 mg | Freq: Four times a day (QID) | INTRAMUSCULAR | Status: DC | PRN
  Administered 2022-07-05: 4 mg via INTRAVENOUS
  Filled 2022-07-05: qty 2

## 2022-07-05 MED ORDER — SODIUM CHLORIDE 0.9 % IV SOLN: INTRAVENOUS | Status: DC

## 2022-07-05 MED ORDER — FENTANYL CITRATE (PF) 100 MCG/2ML IJ SOLN
INTRAMUSCULAR | Status: DC | PRN
  Administered 2022-07-05 (×2): 25 ug via INTRAVENOUS

## 2022-07-05 MED ORDER — HYDROMORPHONE HCL 1 MG/ML IJ SOLN
1.0000 mg | INTRAMUSCULAR | Status: DC | PRN
  Administered 2022-07-05: 1 mg via INTRAVENOUS

## 2022-07-05 MED ORDER — FENTANYL CITRATE (PF) 100 MCG/2ML IJ SOLN
INTRAMUSCULAR | Status: AC
  Filled 2022-07-05: qty 2

## 2022-07-05 MED ORDER — SODIUM CHLORIDE 0.9 % IV SOLN: INTRAVENOUS | Status: AC

## 2022-07-05 MED ORDER — SODIUM CHLORIDE 0.9 % IV SOLN: 250.0000 mL | INTRAVENOUS | Status: DC | PRN

## 2022-07-05 MED ORDER — HEPARIN (PORCINE) IN NACL 1000-0.9 UT/500ML-% IV SOLN
INTRAVENOUS | Status: AC
  Filled 2022-07-05: qty 1000

## 2022-07-05 MED ORDER — MIDAZOLAM HCL 2 MG/2ML IJ SOLN
INTRAMUSCULAR | Status: AC
  Filled 2022-07-05: qty 2

## 2022-07-05 MED ORDER — LIDOCAINE HCL (PF) 1 % IJ SOLN
INTRAMUSCULAR | Status: AC
  Filled 2022-07-05: qty 30

## 2022-07-05 MED ORDER — HYDRALAZINE HCL 20 MG/ML IJ SOLN: 5.0000 mg | INTRAMUSCULAR | Status: DC | PRN

## 2022-07-05 MED ORDER — ONDANSETRON HCL 4 MG/2ML IJ SOLN
INTRAMUSCULAR | Status: DC | PRN
  Administered 2022-07-05: 4 mg via INTRAVENOUS

## 2022-07-05 MED ORDER — MIDAZOLAM HCL 2 MG/2ML IJ SOLN
INTRAMUSCULAR | Status: DC | PRN
  Administered 2022-07-05 (×2): 1 mg via INTRAVENOUS

## 2022-07-05 MED ORDER — ONDANSETRON HCL 4 MG/2ML IJ SOLN
INTRAMUSCULAR | Status: AC
  Filled 2022-07-05: qty 2

## 2022-07-05 MED ORDER — SODIUM CHLORIDE 0.9% FLUSH: 3.0000 mL | INTRAVENOUS | Status: DC | PRN

## 2022-07-05 MED ORDER — HEPARIN SODIUM (PORCINE) 1000 UNIT/ML IJ SOLN
INTRAMUSCULAR | Status: DC | PRN
  Administered 2022-07-05: 9000 [IU] via INTRAVENOUS

## 2022-07-05 MED ORDER — HEPARIN (PORCINE) IN NACL 1000-0.9 UT/500ML-% IV SOLN
INTRAVENOUS | Status: DC | PRN
  Administered 2022-07-05 (×2): 500 mL

## 2022-07-05 MED ORDER — HYDROMORPHONE HCL 1 MG/ML IJ SOLN
INTRAMUSCULAR | Status: AC
  Filled 2022-07-05: qty 1

## 2022-07-05 MED ORDER — LABETALOL HCL 5 MG/ML IV SOLN: 10.0000 mg | INTRAVENOUS | Status: DC | PRN

## 2022-07-05 MED ORDER — IODIXANOL 320 MG/ML IV SOLN
INTRAVENOUS | Status: DC | PRN
  Administered 2022-07-05: 80 mL

## 2022-07-05 MED ORDER — ACETAMINOPHEN 325 MG PO TABS: 650.0000 mg | ORAL_TABLET | ORAL | Status: DC | PRN

## 2022-07-05 MED ORDER — SODIUM CHLORIDE 0.9% FLUSH: 3.0000 mL | Freq: Two times a day (BID) | INTRAVENOUS | Status: DC

## 2022-07-05 SURGICAL SUPPLY — 14 items
CANISTER PENUMBRA ENGINE (MISCELLANEOUS) IMPLANT
CATH ANGIO 5F PIGTAIL 100CM (CATHETERS) IMPLANT
CATH LIGHTNING 7 XTORQ 130 (CATHETERS) IMPLANT
CATH OMNI FLUSH 5F 65CM (CATHETERS) IMPLANT
GLIDEWIRE ADV .035X260CM (WIRE) IMPLANT
KIT ENCORE 26 ADVANTAGE (KITS) IMPLANT
KIT MICROPUNCTURE NIT STIFF (SHEATH) IMPLANT
SHEATH GUIDING 7F 55X73X9MM TD (SHEATH) IMPLANT
SHEATH PINNACLE 5F 10CM (SHEATH) IMPLANT
SHEATH PINNACLE 7F 10CM (SHEATH) IMPLANT
SHEATH PROBE COVER 6X72 (BAG) IMPLANT
STENT VIABAHN 7X29X80 VBX (Permanent Stent) IMPLANT
SYR MEDRAD MARK V 150ML (SYRINGE) IMPLANT
WIRE BENTSON .035X145CM (WIRE) IMPLANT

## 2022-07-05 NOTE — Op Note (Signed)
Patient name: [name] MRN: [id number] DOB: 1965/12/02 Sex: female  07/05/2022 Pre-operative Diagnosis: History of aortic mural thrombus with acute mesenteric ischemia requiring SMA stent with now evidence of high-grade stenosis and thrombus in the SMA stent Post-operative diagnosis:  Same Surgeon:  Marty Heck, MD Procedure Performed: 1.  Ultrasound-guided access right common femoral artery 2.  Mesenteric arteriogram with catheter selection of aorta 3.  Percutaneous mechanical thrombectomy SMA (Penumbra lightening 7) 4.  Angioplasty and stenting of SMA with extension of the mesenteric stent into the abdominal aorta (7 mm x 29 mm VBX) 5.  37 minutes of monitored moderate conscious sedation time  Indications: Patient is a 57 year old female that previously presented on Thanksgiving with acute mesenteric ischemia with evidence of thrombus in her aorta extending into the SMA.  She underwent percutaneous thrombectomy with SMA stenting.  She has had a slow clinical course and ultimately CTA showed persistent thrombus in the SMA stent that appears high-grade.  She presents today for intervention after risks benefits discussed.  Findings:   Mesenteric arteriogram showed the celiac artery was patent without flow-limiting stenosis.  The SMA had a high-grade stenosis in the proximal stent consistent with thrombus as demonstrated on CTA.  From right transfemoral access with a steerable Oscar sheath we were able to perform percutaneous mechanical thrombectomy of the SMA stent and then we restented the proximal vessel with extension of the stent into the abdominal aorta to try and get beyond the mural thrombus with a 7 mm x 29 mm VBX.  Excellent results.  Widely patent stent at completion with filling of the SMA distally.   Procedure:  The patient was identified in the holding area and taken to room 8.  The patient was then placed supine on the table and prepped and draped in the usual  sterile fashion.  A time out was called.  Patient received Versed and fentanyl for moderate sedation.  Vital signs were monitored including heart rate, respiratory rate, oxygenation and blood pressure.  I was present for all of sedation.  Ultrasound was used to evaluate the right common femoral artery.  It was patent .  A digital ultrasound image was acquired.  A micropuncture needle was used to access the right common femoral artery under ultrasound guidance.  An 018 wire was advanced without resistance and a micropuncture sheath was placed.  The 018 wire was removed and a benson wire was placed.  The micropuncture sheath was exchanged for a 5 french sheath.  An omniflush catheter was advanced over the wire to the level of L-1.  A mesenteric angiogram was obtained with a pigtail catheter.  Pertinent findings are noted above.  After evaluating images elected to intervene on the SMA stent with thrombus as demonstrated on CT and angiogram today.  I used a steerable Oscar sheath and a Glidewire advantage to cross the SMA stent and got my wire distally into the SMA vessel.  Patient was given 100 units/kg IV heparin.  I used a penumbra lightening 7 and percutaneous mechanical thrombectomy was performed of the abdominal aorta in the segment of the SMA stent into the segment of the stent where the thrombus was noted on angiogram and CT.  We made multiple passes.  Imaging showed an improved flow channel but still residual thrombus.  I then elected to stent the proximal SMA with a 7 mm x 29 mm VBX and I extended the stent into the aorta.  Excellent results.  We got a  final abdominal angiogram showed filling of the celiac and SMA without flow-limiting stenosis.  Wires and catheters were removed and we put a short 7 French sheath in the right groin.  I did take a right groin sheath shot.  Will be taken holding to have the sheath removed with manual pressure.  She remained stable.  Plan: Excellent results today.  Continue  aspirin Plavix and can resume her Eliquis tomorrow.  I will see her for short follow-up in the office in 2-3 weeks.    Marty Heck, MD [facility] Office: [phone]

## 2022-07-05 NOTE — H&P (Signed)
History and Physical Interval Note:  07/05/2022 7:35 AM  [name]  has presented today for surgery, with the diagnosis of mesentic ischemia.  The various methods of treatment have been discussed with the patient and family. After consideration of risks, benefits and other options for treatment, the patient has consented to  Procedure(s): PERIPHERAL VASCULAR THROMBECTOMY (N/A) as a surgical intervention.  The patient's history has been reviewed, patient examined, no change in status, stable for surgery.  I have reviewed the patient's chart and labs.  Questions were answered to the patient's satisfaction.    Mesenteric angiogram with focus on SMA stent with likely percutaneous thrombectomy and possible additional stenting.  Marty Heck     Patient name: [name]        MRN: [id number]        DOB: Oct 11, 1965        Sex: female   REASON FOR CONSULT: Hospital follow-up after CTA abdomen/pelvis (history of SMA stent for acute mesenteric ischemia)   HPI: [name] is a 57 y.o. female, that presents for ongoing hospital follow-up.  She recently underwent percutaneous mechanical thrombectomy of the SMA with angioplasty and stenting on 05/03/2022 for acute mesenteric ischemia with SMA thrombus.  She did require ex lap with all viable bowel but had evidence of hemoperitoneum with no bleeding source identified by general surgery. She had a slow hospital course but ultimately was discharged home on aspirin and Plavix.      She has made very slow progress at home.  I subsequently sent her for CTA abdomen pelvis on the last clinic visit.  She is still having pain in her abdomen that comes and goes.  States she is able to eat small portions but gets full very quickly.  I did start her on Eliquis this weekend after I saw her CT scan with evidence of thrombus in her mesenteric stent.  She states she took her first dose last night.       Past Medical History:  Diagnosis Date    Anemia     Fibroid     Hyperlipidemia     NSTEMI (non-ST elevated myocardial infarction) ([place]) 06/2017    s/p DES to LCX 07/02/17   Sarcoidosis             Past Surgical History:  Procedure Laterality Date   ABDOMINAL HYSTERECTOMY       BREAST BIOPSY Right 03/24/2018   BREAST REDUCTION SURGERY Bilateral 10/17/2020    Procedure: MAMMARY REDUCTION  (BREAST);  Surgeon: Cindra Presume, MD;  Location: [facility];  Service: Plastics;  Laterality: Bilateral;  2 hours   CESAREAN SECTION   1987   CORONARY/GRAFT ACUTE MI REVASCULARIZATION N/A 06/30/2017    Procedure: Coronary/Graft Acute MI Revascularization;  Surgeon: Lorretta Harp, MD;  Location: [facility];  Service: Cardiovascular;  Laterality: N/A;   FOREARM FRACTURE SURGERY Right 1977   FRACTURE SURGERY       LAPAROSCOPY N/A 05/04/2022    Procedure: LAPAROSCOPY DIAGNOSTIC;  Surgeon: Ralene Ok, MD;  Location: [facility];  Service: General;  Laterality: N/A;   LAPAROTOMY N/A 05/04/2022    Procedure: EXPLORATORY LAPAROTOMY;  Surgeon: Ralene Ok, MD;  Location: [facility];  Service: General;  Laterality: N/A;   LEFT HEART CATH AND CORONARY ANGIOGRAPHY N/A 06/30/2017    Procedure: LEFT HEART CATH AND CORONARY ANGIOGRAPHY;  Surgeon: Lorretta Harp, MD;  Location: [facility];  Service: Cardiovascular;  Laterality: N/A;  LEFT HEART CATH AND CORONARY ANGIOGRAPHY N/A 12/23/2017    Procedure: LEFT HEART CATH AND CORONARY ANGIOGRAPHY;  Surgeon: Lorretta Harp, MD;  Location: [facility];  Service: Cardiovascular;  Laterality: N/A;   TUBAL LIGATION   1990           Family History  Problem Relation Age of Onset   Diabetes Mother     Stroke Mother 74   Hypertension Mother     Hyperlipidemia Father     Stroke Father 6   Hypertension Father     Breast cancer Paternal Grandmother        SOCIAL HISTORY: Social History         Socioeconomic History   Marital status: Widowed      Spouse name: Not on file   Number  of children: 2   Years of education: Not on file   Highest education level: Not on file  Occupational History   Occupation: Audiological scientist: Doctor, general practice  Tobacco Use   Smoking status: Some Days      Packs/day: 1.00      Years: 29.00      Total pack years: 29.00      Types: Cigarettes      Last attempt to quit: 03/19/2018      Years since quitting: 4.2   Smokeless tobacco: Never   Tobacco comments:      11/09/2021 Patient smokes some days  started back in 2023  Vaping Use   Vaping Use: Never used  Substance and Sexual Activity   Alcohol use: Not Currently      Comment: /no   Drug use: Never   Sexual activity: Not on file  Other Topics Concern   Not on file  Social History Narrative    Are you right handed or left handed? right    Are you currently employed ?     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 befriend    Who lives with you?     What type of home do you live in: 1 story or 2 story? two         Social Determinants of Health        Financial Resource Strain: Not on file  Food Insecurity: No Food Insecurity (05/04/2022)    Hunger Vital Sign     Worried About Running Out of Food in the Last Year: Never true     Ran Out of Food in the Last Year: Never true  Transportation Needs: No Transportation Needs (05/04/2022)    PRAPARE - Armed forces logistics/support/administrative officer (Medical): No     Lack of Transportation (Non-Medical): No  Physical Activity: Not on file  Stress: Not on file  Social Connections: Not on file  Intimate Partner Violence: Not At Risk (05/04/2022)    Humiliation, Afraid, Rape, and Kick questionnaire     Fear of Current or Ex-Partner: No     Emotionally Abused: No     Physically Abused: No     Sexually Abused: No           Allergies  Allergen Reactions   Penicillins Itching      Caused rash and blisters   Hydrocodone Hives and Itching             Current Outpatient Medications  Medication Sig Dispense Refill  APIXABAN (ELIQUIS) VTE STARTER PACK ('10MG'$  AND '5MG'$ ) Take as directed on package: start with two-'5mg'$  tablets twice daily for 7 days. On day 8, switch to one-'5mg'$  tablet twice daily. 1 each 0   aspirin EC 81 MG tablet Take 1 tablet (81 mg total) by mouth daily. Swallow whole. 30 tablet     atorvastatin (LIPITOR) 40 MG tablet Take 1 tablet (40 mg total) by mouth daily. 30 tablet 2   benzonatate (TESSALON) 100 MG capsule Take 1 capsule (100 mg total) by mouth every 8 (eight) hours. (Patient not taking: Reported on 06/15/2022) 21 capsule 0   cholecalciferol (VITAMIN D3) 25 MCG (1000 UNIT) tablet Take 2,000 Units by mouth daily. Gummies       clopidogrel (PLAVIX) 75 MG tablet Take 1 tablet (75 mg total) by mouth daily with breakfast. 30 tablet 11   gabapentin (NEURONTIN) 300 MG capsule Take 1 capsule (300 mg total) by mouth at bedtime. 90 capsule 0    No current facility-administered medications for this visit.      REVIEW OF SYSTEMS:  '[X]'$  denotes positive finding, '[ ]'$  denotes negative finding Cardiac   Comments:  Chest pain or chest pressure:      Shortness of breath upon exertion:      Short of breath when lying flat:      Irregular heart rhythm:             Vascular      Pain in calf, thigh, or hip brought on by ambulation:      Pain in feet at night that wakes you up from your sleep:       Blood clot in your veins:      Leg swelling:              Pulmonary      Oxygen at home:      Productive cough:       Wheezing:              Neurologic      Sudden weakness in arms or legs:       Sudden numbness in arms or legs:       Sudden onset of difficulty speaking or slurred speech:      Temporary loss of vision in one eye:       Problems with dizziness:              Gastrointestinal      Blood in stool:       Vomited blood:              Genitourinary      Burning when urinating:       Blood in urine:              Psychiatric      Major depression:              Hematologic      Bleeding problems:      Problems with blood clotting too easily:             Skin      Rashes or ulcers:             Constitutional      Fever or chills:          PHYSICAL EXAM:    Vitals:    06/26/22 1048  BP: (!) 142/88  Pulse: 84  Resp: 16  Temp: 97.8 F (36.6 C)  TempSrc: Temporal  SpO2:  98%  Weight: 191 lb (86.6 kg)  Height: 5' 4.5" (1.638 m)      GENERAL: The patient is a well-nourished female, in no acute distress. The vital signs are documented above. CARDIAC: There is a regular rate and rhythm.  PULMONARY: No respiratory distress. ABDOMEN: Midline incision healed.  No rebound or guarding.  No peritonitis. MUSCULOSKELETAL: There are no major deformities or cyanosis. NEUROLOGIC: No focal weakness or paresthesias are detected. PSYCHIATRIC: The patient has a normal affect.   DATA:    CTA abdomen pelvis reviewed from 06/22/22 that shows thrombus in her recently placed SMA stent for acute mesenteric ischemia likely an extension of the chronic thrombus in her descending thoracic and abdominal visceral aorta.   Assessment/Plan:   57 y.o. female that presents for follow-up after CTA abdomen pelvis for further evaluation of her ongoing abdominal pain and slow progress after percutaneous mechanical thrombectomy of the SMA with angioplasty and stenting on 05/03/2022 for acute mesenteric ischemia with SMA thrombus.  She did require laparotomy by general surgery on 05/04/2022 with findings of hemoperitoneum with no bleeding source found.  Discussed there is thrombus in the proximal SMA stent likely high-grade stenosis as an extension from chronic mural thrombus in her descending thoracic and abdominal visceral aorta.  I have recommended proceeding back to the Cath Lab for percutaneous thrombectomy and likely additional stenting of her SMA further into the aorta.  I think she would have a hostile abdomen at this  time and discussed the other option would be mesenteric bypass.  I discussed risk of thromboembolism from manipulation of this thrombus from intervention with risk of bowel ischemia as well as other risks in the Cath Lab including vessel injury bleeding etc.  She wishes to proceed.  I have asked that she stay on her Eliquis until about 48 hours prior to her procedure and then hold.  She can continue aspirin Plavix.     Marty Heck, MD [facility] Office: [phone]

## 2022-07-05 NOTE — Discharge Instructions (Signed)
Femoral Site Care This sheet gives you information about how to care for yourself after your procedure. Your health care provider may also give you more specific instructions. If you have problems or questions, contact your health care provider. What can I expect after the procedure?  After the procedure, it is common to have: Bruising that usually fades within 1-2 weeks. Tenderness at the site. Follow these instructions at home: Wound care Follow instructions from your health care provider about how to take care of your insertion site. Make sure you: Wash your hands with soap and water before you change your bandage (dressing). If soap and water are not available, use hand sanitizer. Remove your dressing as told by your health care provider. In 24 hours Do not take baths, swim, or use a hot tub until your health care provider approves. You may shower 24-48 hours after the procedure or as told by your health care provider. Gently wash the site with plain soap and water. Pat the area dry with a clean towel. Do not rub the site. This may cause bleeding. Do not apply powder or lotion to the site. Keep the site clean and dry. Check your femoral site every day for signs of infection. Check for: Redness, swelling, or pain. Fluid or blood. Warmth. Pus or a bad smell. Activity For the first 2-3 days after your procedure, or as long as directed: Avoid climbing stairs as much as possible. Do not squat. Do not lift anything that is heavier than 10 lb (4.5 kg), or the limit that you are told, until your health care provider says that it is safe. For 5 days Rest as directed. Avoid sitting for a long time without moving. Get up to take short walks every 1-2 hours. Do not drive for 24 hours if you were given a medicine to help you relax (sedative). General instructions Take over-the-counter and prescription medicines only as told by your health care provider. Keep all follow-up visits as told by  your health care provider. This is important. Contact a health care provider if you have: A fever or chills. You have redness, swelling, or pain around your insertion site. Get help right away if: The catheter insertion area swells very fast. You pass out. You suddenly start to sweat or your skin gets clammy. The catheter insertion area is bleeding, and the bleeding does not stop when you hold steady pressure on the area. The area near or just beyond the catheter insertion site becomes pale, cool, tingly, or numb. These symptoms may represent a serious problem that is an emergency. Do not wait to see if the symptoms will go away. Get medical help right away. Call your local emergency services (911 in the [place] Do not drive yourself to the hospital. Summary After the procedure, it is common to have bruising that usually fades within 1-2 weeks. Check your femoral site every day for signs of infection. Do not lift anything that is heavier than 10 lb (4.5 kg), or the limit that you are told, until your health care provider says that it is safe. This information is not intended to replace advice given to you by your health care provider. Make sure you discuss any questions you have with your health care provider. Document Revised: 06/10/2017 Document Reviewed: 06/10/2017 Elsevier Patient Education  2020 [employer] Inc[employer] 

## 2022-07-05 NOTE — Progress Notes (Signed)
Site area: rt groin Site Prior to Removal:  Level 0 Pressure Applied For: 25 minutes Manual:   yes Patient Status During Pull:  stable Post Pull Site:  Level 0 Post Pull Instructions Given:  yes Post Pull Pulses Present: rt dp dopplered Dressing Applied:  gauze and tegaderm Bedrest begins @ 1140 Comments:

## 2022-07-06 ENCOUNTER — Telehealth: Payer: Self-pay | Admitting: Vascular Surgery

## 2022-07-06 NOTE — Telephone Encounter (Signed)
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  Ultrasound-guided access right common femoral artery 2.  Mesenteric arteriogram with catheter selection of aorta 3.  Percutaneous mechanical thrombectomy SMA (Penumbra lightening 7) 4.  Angioplasty and stenting of SMA with extension of the mesenteric stent into the abdominal aorta (7 mm x 29 mm VBX) 5.  37 minutes of monitored moderate conscious sedation time   #Can you arrange follow-up with me in 3 weeks in the office with mesenteric duplex?  Thanks,  Gerald Stabs

## 2022-07-10 ENCOUNTER — Encounter: Payer: Self-pay | Admitting: Cardiovascular Disease

## 2022-07-10 ENCOUNTER — Ambulatory Visit: Payer: Commercial Managed Care - PPO | Admitting: Cardiovascular Disease

## 2022-07-10 VITALS — BP 120/78 | HR 92 | Ht 64.5 in | Wt 189.0 lb

## 2022-07-10 DIAGNOSIS — K55059 Acute (reversible) ischemia of intestine, part and extent unspecified: Secondary | ICD-10-CM

## 2022-07-10 DIAGNOSIS — Z720 Tobacco use: Secondary | ICD-10-CM

## 2022-07-10 DIAGNOSIS — I2510 Atherosclerotic heart disease of native coronary artery without angina pectoris: Secondary | ICD-10-CM | POA: Diagnosis not present

## 2022-07-10 DIAGNOSIS — E785 Hyperlipidemia, unspecified: Secondary | ICD-10-CM

## 2022-07-10 NOTE — Progress Notes (Signed)
07/10/2022 LANYA BUCKS   [birth year]  [id number]  Primary Physician Glendon Axe, MD Primary Cardiologist: Lorretta Harp MD [name], [place]  HPI:  [name] is a 57 y.o.   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  I last saw her in the office 12/04/2019.Marland Kitchen  She had a non-STEMI and occurred on 06/30/17 status post PCI and drug-eluting stenting of the proximal AV groove circumflex. She did have a 70% proximal LAD lesion which did I did not think was physiologically significant. 2-D echo performed 07/01/17 showed normal LV function. She does have a history of sarcoidosisas well. She has long history of tobacco abuse having smoked a pack a day for 30 years currently smoking 4 cigarettes a day. She continues to have atypical constant chest pain.   She had a routine GXT which was abnormal and subsequent Myoview stress test that showed inferolateral scar without ischemia was likely from her circumflex infarct.  She did have a repeat cardiac catheterization performed 12/23/2017 because of chest pain that showed patent stents with otherwise normal coronary arteries. She has somewhat reduce her tobacco intake down to several cigarettes a day.    I told her that she can go back to work.   Since I saw her in the office 2-1/2 years ago she did have acute mesenteric ischemia 05/03/2022 and underwent thrombectomy and SMA stenting.  This was redone by Dr. Carlis Abbott 07/05/2022.  She stopped smoking a year ago.  She has occasional atypical chest pain.   Current Meds  Medication Sig   acetaminophen (TYLENOL) 500 MG tablet Take 1,000 mg by mouth every 6 (six) hours as needed for moderate pain.   APIXABAN (ELIQUIS) VTE STARTER PACK ('10MG'$  AND '5MG'$ ) Take as directed on package: start with two-'5mg'$  tablets twice daily for 7 days. On day 8, switch to  one-'5mg'$  tablet twice daily. (Patient taking differently: Take '5mg'$  tablet twice daily.)   aspirin EC 81 MG tablet Take 1 tablet (81 mg total) by mouth daily. Swallow whole.   atorvastatin (LIPITOR) 40 MG tablet Take 1 tablet (40 mg total) by mouth daily.   clopidogrel (PLAVIX) 75 MG tablet Take 1 tablet (75 mg total) by mouth daily with breakfast.   famotidine (PEPCID) 20 MG tablet Take 20 mg by mouth daily as needed for heartburn or indigestion.   gabapentin (NEURONTIN) 300 MG capsule Take 1 capsule (300 mg total) by mouth at bedtime.     Allergies  Allergen Reactions   Penicillins Itching and Rash    Caused rash and blisters   Hydrocodone Hives and Itching    Social History   Socioeconomic History   Marital status: Widowed    Spouse name: Not on file   Number of children: 2   Years of education: Not on file   Highest education level: Not on file  Occupational History   Occupation: Scientist, forensic     Employer: [employer], [employer]  Tobacco Use   Smoking status: Some Days    Packs/day: 1.00    Years: 29.00    Total pack years: 29.00    Types: Cigarettes    Last attempt to quit: 03/19/2018    Years since quitting: 4.3   Smokeless tobacco: Never   Tobacco comments:    11/09/2021 Patient smokes some days  started back in  2023  Vaping Use   Vaping Use: Never used  Substance and Sexual Activity   Alcohol use: Not Currently    Comment: /no   Drug use: Never   Sexual activity: Not on file  Other Topics Concern   Not on file  Social History Narrative   Are you right handed or left handed? right   Are you currently employed ?    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 befriend   Who lives with you?    What type of home do you live in: 1 story or 2 story? two       Social Determinants of Health   Financial Resource Strain: Not on file  Food Insecurity: No Food Insecurity (05/04/2022)   Hunger Vital Sign     Worried About Running Out of Food in the Last Year: Never true    Ran Out of Food in the Last Year: Never true  Transportation Needs: No Transportation Needs (05/04/2022)   PRAPARE - Hydrologist (Medical): No    Lack of Transportation (Non-Medical): No  Physical Activity: Not on file  Stress: Not on file  Social Connections: Not on file  Intimate Partner Violence: Not At Risk (05/04/2022)   Humiliation, Afraid, Rape, and Kick questionnaire    Fear of Current or Ex-Partner: No    Emotionally Abused: No    Physically Abused: No    Sexually Abused: No     Review of Systems: General: negative for chills, fever, night sweats or weight changes.  Cardiovascular: negative for chest pain, dyspnea on exertion, edema, orthopnea, palpitations, paroxysmal nocturnal dyspnea or shortness of breath Dermatological: negative for rash Respiratory: negative for cough or wheezing Urologic: negative for hematuria Abdominal: negative for nausea, vomiting, diarrhea, bright red blood per rectum, melena, or hematemesis Neurologic: negative for visual changes, syncope, or dizziness All other systems reviewed and are otherwise negative except as noted above.    Blood pressure 120/78, pulse 92, height 5' 4.5" (1.638 m), weight 189 lb (85.7 kg), SpO2 100 %.  General appearance: alert and no distress Neck: no adenopathy, no carotid bruit, no JVD, supple, symmetrical, trachea midline, and thyroid not enlarged, symmetric, no tenderness/mass/nodules Lungs: clear to auscultation bilaterally Heart: regular rate and rhythm, S1, S2 normal, no murmur, click, rub or gallop Extremities: extremities normal, atraumatic, no cyanosis or edema Pulses: 2+ and symmetric Skin: Skin color, texture, turgor normal. No rashes or lesions Neurologic: Grossly normal  EKG not performed today  ASSESSMENT AND PLAN:   CAD (coronary artery disease) History of CAD status post non-STEMI 06/30/2017 status  post PCI drug-eluting stenting of the proximal AV groove circumflex.  She did have a 70% proximal LAD lesion that I did not think was physiologically insignificant.  She had a Myoview performed which showed inferolateral scar without ischemia.  Because of chest pain this led to cardiac catheterization 12/23/2017 that showed patent stents.  She still gets occasional atypical chest pain.  Hyperlipidemia with target LDL less than 70 History of hyperlipidemia on atorvastatin with lipid profile performed 04/10/2021 revealing total cholesterol 118, LDL 65 and HDL 34.  Tobacco abuse Discontinue tobacco abuse with year ago.  Acute mesenteric ischemia ([place]) Ms. Streets had SMA stent performed by Dr. Carlis Abbott 05/03/2022 after CTA showed abdominal mural thrombus encroaching on the SMA.  She had repeat intervention 07/05/2022 with evidence of high-grade in-stent restenosis with thrombosis.  She  currently is on Eliquis.     Lorretta Harp MD FACP,FACC,FAHA, [facility] 07/10/2022 10:12 AM

## 2022-07-10 NOTE — Patient Instructions (Signed)
Medication Instructions:  Your physician recommends that you continue on your current medications as directed. Please refer to the Current Medication list given to you today.  *If you need a refill on your cardiac medications before your next appointment, please call your pharmacy*   Lab Work: Your physician recommends that you have labs drawn today: Lipid/Liver panel  If you have labs (blood work) drawn today and your tests are completely normal, you will receive your results only by: MyChart Message (if you have MyChart) OR A paper copy in the mail If you have any lab test that is abnormal or we need to change your treatment, we will call you to review the results.   Follow-Up: At [facility], you and your health needs are our priority.  As part of our continuing mission to provide you with exceptional heart care, we have created designated Provider Care Teams.  These Care Teams include your primary Cardiologist (physician) and Advanced Practice Providers (APPs -  Physician Assistants and Nurse Practitioners) who all work together to provide you with the care you need, when you need it.  We recommend signing up for the patient portal called "MyChart".  Sign up information is provided on this After Visit Summary.  MyChart is used to connect with patients for Virtual Visits (Telemedicine).  Patients are able to view lab/test results, encounter notes, upcoming appointments, etc.  Non-urgent messages can be sent to your provider as well.   To learn more about what you can do with MyChart, go to [email or web address].    Your next appointment:   6 month(s)  Provider:   Fabian Sharp, PA-C, Sande Rives, PA-C, Caron Presume, PA-C, Jory Sims, DNP, ANP, Almyra Deforest, PA-C, or Diona Browner, NP      Then, Quay Burow, MD will plan to see you again in 12 month(s).

## 2022-07-10 NOTE — Assessment & Plan Note (Signed)
[name] had SMA stent performed by Dr. Carlis Abbott 05/03/2022 after CTA showed abdominal mural thrombus encroaching on the SMA.  She had repeat intervention 07/05/2022 with evidence of high-grade in-stent restenosis with thrombosis.  She currently is on Eliquis.

## 2022-07-10 NOTE — Assessment & Plan Note (Signed)
History of CAD status post non-STEMI 06/30/2017 status post PCI drug-eluting stenting of the proximal AV groove circumflex.  She did have a 70% proximal LAD lesion that I did not think was physiologically insignificant.  She had a Myoview performed which showed inferolateral scar without ischemia.  Because of chest pain this led to cardiac catheterization 12/23/2017 that showed patent stents.  She still gets occasional atypical chest pain.

## 2022-07-10 NOTE — Assessment & Plan Note (Signed)
Discontinue tobacco abuse with year ago.

## 2022-07-10 NOTE — Assessment & Plan Note (Signed)
History of hyperlipidemia on atorvastatin with lipid profile performed 04/10/2021 revealing total cholesterol 118, LDL 65 and HDL 34.

## 2022-07-11 LAB — LIPID PANEL
Chol/HDL Ratio: 3.1 ratio (ref 0.0–4.4)
Cholesterol, Total: 129 mg/dL (ref 100–199)
HDL: 41 mg/dL (ref >39)
LDL Chol Calc (NIH): 72 mg/dL (ref 0–99)
Triglycerides: 80 mg/dL (ref 0–149)
VLDL Cholesterol Cal: 16 mg/dL (ref 5–40)

## 2022-07-11 LAB — HEPATIC FUNCTION PANEL
ALT: 15 IU/L (ref 0–32)
AST: 17 IU/L (ref 0–40)
Albumin: 4 g/dL (ref 3.8–4.9)
Alkaline Phosphatase: 170 IU/L — ABNORMAL HIGH (ref 44–121)
Bilirubin Total: 0.3 mg/dL (ref 0.0–1.2)
Bilirubin, Direct: 0.13 mg/dL (ref 0.00–0.40)
Total Protein: 6.4 g/dL (ref 6.0–8.5)

## 2022-07-12 ENCOUNTER — Telehealth: Payer: Self-pay

## 2022-07-12 NOTE — Telephone Encounter (Signed)
I reached out to [name] regarding FMLA and Short Term Disability paperwork. I advised pt the paperwork has been completed, faxed & successful fax confirmation was received. Patient voiced her understanding.

## 2022-07-16 ENCOUNTER — Other Ambulatory Visit: Payer: Self-pay | Admitting: *Deleted

## 2022-07-16 ENCOUNTER — Other Ambulatory Visit: Payer: Self-pay | Admitting: Podiatry

## 2022-07-16 DIAGNOSIS — K55059 Acute (reversible) ischemia of intestine, part and extent unspecified: Secondary | ICD-10-CM

## 2022-07-31 ENCOUNTER — Ambulatory Visit (HOSPITAL_COMMUNITY)
Admission: RE | Admit: 2022-07-31 | Discharge: 2022-07-31 | Disposition: A | Discharge status: Home / Self Care | Payer: Commercial Managed Care - PPO | Source: Ambulatory Visit | Attending: Vascular Surgery | Admitting: Vascular Surgery

## 2022-07-31 ENCOUNTER — Ambulatory Visit (INDEPENDENT_AMBULATORY_CARE_PROVIDER_SITE_OTHER): Payer: Commercial Managed Care - PPO | Admitting: Vascular Surgery

## 2022-07-31 ENCOUNTER — Encounter: Payer: Self-pay | Admitting: Vascular Surgery

## 2022-07-31 VITALS — BP 143/80 | HR 77 | Temp 97.8°F | Resp 14 | Ht 64.5 in | Wt 188.0 lb

## 2022-07-31 DIAGNOSIS — K55059 Acute (reversible) ischemia of intestine, part and extent unspecified: Secondary | ICD-10-CM

## 2022-07-31 MED ORDER — APIXABAN 5 MG PO TABS: 5.0000 mg | ORAL_TABLET | Freq: Two times a day (BID) | ORAL | 5 refills | Status: DC

## 2022-07-31 NOTE — Progress Notes (Signed)
Patient name: [name] MRN: [id number] DOB: [birth year] Sex: female  REASON FOR CONSULT: Hospital follow-up after recent SMA intervention    HPI: [name] is a 57 y.o. female, that presents for ongoing hospital follow-up after recent SMA intervention.  She initially underwent percutaneous mechanical thrombectomy of the SMA with angioplasty and stenting on 05/03/2022 for acute mesenteric ischemia with SMA thrombus.  She did require ex lap with all viable bowel but had evidence of hemoperitoneum with no bleeding source identified by general surgery. She had a slow hospital course but ultimately was discharged home on aspirin and Plavix.     I subsequently sent her for CTA abdomen pelvis given her slow clinical progress.  Her CTA 06/23/22 showed recurrent thrombus in the SMA stent with associated thrombus in her visceral aorta as demonstrated on previous imaging.  I started her on Eliquis.  I took her back to the Cath Lab on 07/05/2022 for percutaneous thrombectomy of the SMA stent and extension of the SMA stent into the abdominal aorta.  Overall she is not having any significant postprandial abdominal pain today.  She states she can eat.  She complains of bloating and some shortness of breath.  She finished her last dose of Eliquis yesterday and needs a refill.  She is on aspirin Plavix.  Past Medical History:  Diagnosis Date   Anemia    Fibroid    Hyperlipidemia    NSTEMI (non-ST elevated myocardial infarction) ([place]) 06/2017   s/p DES to LCX 07/02/17   Sarcoidosis     Past Surgical History:  Procedure Laterality Date   ABDOMINAL AORTOGRAM N/A 07/05/2022   Procedure: ABDOMINAL AORTOGRAM;  Surgeon: Marty Heck, MD;  Location: [place] [facility];  Service: Cardiovascular;  Laterality: N/A;   ABDOMINAL HYSTERECTOMY     BREAST BIOPSY Right 03/24/2018   BREAST REDUCTION SURGERY Bilateral 10/17/2020   Procedure: MAMMARY REDUCTION  (BREAST);  Surgeon: Cindra Presume, MD;   Location: [place];  Service: Plastics;  Laterality: Bilateral;  2 hours   CESAREAN SECTION  1987   CORONARY/GRAFT ACUTE MI REVASCULARIZATION N/A 06/30/2017   Procedure: Coronary/Graft Acute MI Revascularization;  Surgeon: Lorretta Harp, MD;  Location: [place] [facility];  Service: Cardiovascular;  Laterality: N/A;   FOREARM FRACTURE SURGERY Right 1977   FRACTURE SURGERY     LAPAROSCOPY N/A 05/04/2022   Procedure: LAPAROSCOPY DIAGNOSTIC;  Surgeon: Ralene Ok, MD;  Location: [place];  Service: General;  Laterality: N/A;   LAPAROTOMY N/A 05/04/2022   Procedure: EXPLORATORY LAPAROTOMY;  Surgeon: Ralene Ok, MD;  Location: [place];  Service: General;  Laterality: N/A;   LEFT HEART CATH AND CORONARY ANGIOGRAPHY N/A 06/30/2017   Procedure: LEFT HEART CATH AND CORONARY ANGIOGRAPHY;  Surgeon: Lorretta Harp, MD;  Location: [place] [facility];  Service: Cardiovascular;  Laterality: N/A;   LEFT HEART CATH AND CORONARY ANGIOGRAPHY N/A 12/23/2017   Procedure: LEFT HEART CATH AND CORONARY ANGIOGRAPHY;  Surgeon: Lorretta Harp, MD;  Location: [place] [facility];  Service: Cardiovascular;  Laterality: N/A;   PERIPHERAL VASCULAR INTERVENTION  07/05/2022   Procedure: PERIPHERAL VASCULAR INTERVENTION;  Surgeon: Marty Heck, MD;  Location: [place] [facility];  Service: Cardiovascular;;  SMA   PERIPHERAL VASCULAR THROMBECTOMY N/A 07/05/2022   Procedure: PERIPHERAL VASCULAR THROMBECTOMY;  Surgeon: Marty Heck, MD;  Location: [place] [facility];  Service: Cardiovascular;  Laterality: N/A;  SMA   TUBAL LIGATION  1990    Family  History  Problem Relation Age of Onset   Diabetes Mother    Stroke Mother 75   Hypertension Mother    Hyperlipidemia Father    Stroke Father 51   Hypertension Father    Breast cancer Paternal Grandmother     SOCIAL HISTORY: Social History   Socioeconomic History   Marital status: Widowed    Spouse name: Not on file   Number of children: 2   Years of  education: Not on file   Highest education level: Not on file  Occupational History   Occupation: Chiropodist: Doctor, general practice  Tobacco Use   Smoking status: Some Days    Packs/day: 1.00    Years: 29.00    Total pack years: 29.00    Types: Cigarettes    Last attempt to quit: 03/19/2018    Years since quitting: 4.3   Smokeless tobacco: Never   Tobacco comments:    11/09/2021 Patient smokes some days  started back in 2023  Vaping Use   Vaping Use: Never used  Substance and Sexual Activity   Alcohol use: Not Currently    Comment: /no   Drug use: Never   Sexual activity: Not on file  Other Topics Concern   Not on file  Social History Narrative   Are you right handed or left handed? right   Are you currently employed ?    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 befriend   Who lives with you?    What type of home do you live in: 1 story or 2 story? two       Social Determinants of Health   Financial Resource Strain: Not on file  Food Insecurity: No Food Insecurity (05/04/2022)   Hunger Vital Sign    Worried About Running Out of Food in the Last Year: Never true    Ran Out of Food in the Last Year: Never true  Transportation Needs: No Transportation Needs (05/04/2022)   PRAPARE - Hydrologist (Medical): No    Lack of Transportation (Non-Medical): No  Physical Activity: Not on file  Stress: Not on file  Social Connections: Not on file  Intimate Partner Violence: Not At Risk (05/04/2022)   Humiliation, Afraid, Rape, and Kick questionnaire    Fear of Current or Ex-Partner: No    Emotionally Abused: No    Physically Abused: No    Sexually Abused: No    Allergies  Allergen Reactions   Penicillins Itching and Rash    Caused rash and blisters   Hydrocodone Hives and Itching    Current Outpatient Medications  Medication Sig Dispense Refill   acetaminophen  (TYLENOL) 500 MG tablet Take 1,000 mg by mouth every 6 (six) hours as needed for moderate pain.     aspirin EC 81 MG tablet Take 1 tablet (81 mg total) by mouth daily. Swallow whole. 30 tablet    atorvastatin (LIPITOR) 40 MG tablet Take 1 tablet (40 mg total) by mouth daily. 30 tablet 2   clopidogrel (PLAVIX) 75 MG tablet Take 1 tablet (75 mg total) by mouth daily with breakfast. 30 tablet 11   famotidine (PEPCID) 20 MG tablet Take 20 mg by mouth daily as needed for heartburn or indigestion.     gabapentin (NEURONTIN) 300 MG capsule TAKE 1 CAPSULE(300 MG) BY MOUTH AT BEDTIME 90 capsule 0   APIXABAN (ELIQUIS)  VTE STARTER PACK (10MG AND 5MG) Take as directed on package: start with two-59m tablets twice daily for 7 days. On day 8, switch to one-532mtablet twice daily. (Patient not taking: Reported on 07/31/2022) 1 each 0   No current facility-administered medications for this visit.    REVIEW OF SYSTEMS:  [X]$  denotes positive finding, [ ]$  denotes negative finding Cardiac  Comments:  Chest pain or chest pressure:    Shortness of breath upon exertion:    Short of breath when lying flat:    Irregular heart rhythm:        Vascular    Pain in calf, thigh, or hip brought on by ambulation:    Pain in feet at night that wakes you up from your sleep:     Blood clot in your veins:    Leg swelling:         Pulmonary    Oxygen at home:    Productive cough:     Wheezing:         Neurologic    Sudden weakness in arms or legs:     Sudden numbness in arms or legs:     Sudden onset of difficulty speaking or slurred speech:    Temporary loss of vision in one eye:     Problems with dizziness:         Gastrointestinal    Blood in stool:     Vomited blood:         Genitourinary    Burning when urinating:     Blood in urine:        Psychiatric    Major depression:         Hematologic    Bleeding problems:    Problems with blood clotting too easily:        Skin    Rashes or ulcers:         Constitutional    Fever or chills:      PHYSICAL EXAM: Vitals:   07/31/22 0944  BP: (!) 143/80  Pulse: 77  Resp: 14  Temp: 97.8 F (36.6 C)  TempSrc: Temporal  SpO2: 99%  Weight: 188 lb (85.3 kg)  Height: 5' 4.5" (1.638 m)    GENERAL: The patient is a well-nourished female, in no acute distress. The vital signs are documented above. CARDIAC: There is a regular rate and rhythm.  VASCULAR: Palpable femoral pulses bilatearlly PULMONARY: No respiratory distress. ABDOMEN: Midline incision healed.  No rebound or guarding.  No peritonitis.  No significant pain. MUSCULOSKELETAL: There are no major deformities or cyanosis. NEUROLOGIC: No focal weakness or paresthesias are detected. PSYCHIATRIC: The patient has a normal affect.  DATA:   Mesenteric duplex today shows SMA stent is patent although limited visualization.  She has known celiac disease.  Assessment/Plan:  [age].o. female that presents for hospital follow-up of her acute mesenteric ischemia. She initially had mechanical thrombectomy of the SMA with angioplasty and stenting on 05/03/2022 for acute mesenteric ischemia with SMA thrombus.  She did require laparotomy by general surgery on 05/04/2022 with findings of hemoperitoneum with no bleeding source found.   She had a slow clinical course and we got a repeat CTA last month that showed thrombus in her proximal SMA stent likely extension from chronic mural thrombus in the descending thoracic and abdominal visceral aorta. I took her back to the Cath Lab on 07/05/2022 with extension of the SMA stent into the abdominal aorta with an additional VBX.  Overall she  looks better today and is tolerating an oral diet and having bowel function.  She complains of some bloating and shortness of breath that has been chronic.  I think she looks as good as I have seen her clinically.  I will see her again in 1 month to see how she is progressing.  I refilled her Eliquis today and discussed she needs  triple therapy with Eliquis aspirin and Plavix.   Marty Heck, MD [facility] Office: [phone]

## 2022-08-06 ENCOUNTER — Encounter: Payer: Self-pay | Admitting: Neurology

## 2022-08-06 ENCOUNTER — Encounter: Payer: Commercial Managed Care - PPO | Admitting: Neurology

## 2022-08-06 DIAGNOSIS — Z029 Encounter for administrative examinations, unspecified: Secondary | ICD-10-CM

## 2022-08-14 ENCOUNTER — Other Ambulatory Visit: Payer: Self-pay | Admitting: Vascular Surgery

## 2022-08-28 ENCOUNTER — Encounter: Payer: Self-pay | Admitting: Vascular Surgery

## 2022-08-28 ENCOUNTER — Ambulatory Visit: Payer: Commercial Managed Care - PPO | Admitting: Vascular Surgery

## 2022-08-28 VITALS — BP 166/89 | HR 78 | Temp 97.7°F | Resp 14 | Ht 64.5 in | Wt 199.0 lb

## 2022-08-28 DIAGNOSIS — K55059 Acute (reversible) ischemia of intestine, part and extent unspecified: Secondary | ICD-10-CM

## 2022-08-28 NOTE — Progress Notes (Signed)
Patient name: [name] MRN: [id number] DOB: 02/05/1966 Sex: female  REASON FOR CONSULT: 1 month follow-up after recent SMA intervention    HPI: [name] is a 57 y.o. female, that presents for ongoing hospital follow-up after recent SMA intervention.  She initially underwent percutaneous mechanical thrombectomy of the SMA with angioplasty and stenting on 05/03/2022 for acute mesenteric ischemia with SMA thrombus.  She did require ex lap with all viable bowel but had evidence of hemoperitoneum with no bleeding source identified by general surgery. She had a slow hospital course but ultimately was discharged home on aspirin and Plavix.     I subsequently sent her for CTA abdomen pelvis given her slow clinical progress.  Her CTA 06/23/22 showed recurrent thrombus in the SMA stent with associated thrombus in her visceral aorta as demonstrated on previous imaging.  I started her on Eliquis.  I took her back to the Cath Lab on 07/05/2022 for percutaneous thrombectomy of the SMA stent and extension of the SMA stent into the abdominal aorta.  Overall she is doing much better today.  She is not having any significant postprandial pain.  She is gaining weight.  She is taking her aspirin Plavix and Eliquis.  She is looking forward to going back to work next month.  Past Medical History:  Diagnosis Date   Anemia    Fibroid    Hyperlipidemia    NSTEMI (non-ST elevated myocardial infarction) ([place]) 06/2017   s/p DES to LCX 07/02/17   Sarcoidosis     Past Surgical History:  Procedure Laterality Date   ABDOMINAL AORTOGRAM N/A 07/05/2022   Procedure: ABDOMINAL AORTOGRAM;  Surgeon: Marty Heck, MD;  Location: [place] [facility];  Service: Cardiovascular;  Laterality: N/A;   ABDOMINAL HYSTERECTOMY     BREAST BIOPSY Right 03/24/2018   BREAST REDUCTION SURGERY Bilateral 10/17/2020   Procedure: MAMMARY REDUCTION  (BREAST);  Surgeon: Cindra Presume, MD;  Location: [place];  Service:  Plastics;  Laterality: Bilateral;  2 hours   CESAREAN SECTION  1987   CORONARY/GRAFT ACUTE MI REVASCULARIZATION N/A 06/30/2017   Procedure: Coronary/Graft Acute MI Revascularization;  Surgeon: Lorretta Harp, MD;  Location: [facility] [facility];  Service: Cardiovascular;  Laterality: N/A;   FOREARM FRACTURE SURGERY Right 1977   FRACTURE SURGERY     LAPAROSCOPY N/A 05/04/2022   Procedure: LAPAROSCOPY DIAGNOSTIC;  Surgeon: Ralene Ok, MD;  Location: [facility];  Service: General;  Laterality: N/A;   LAPAROTOMY N/A 05/04/2022   Procedure: EXPLORATORY LAPAROTOMY;  Surgeon: Ralene Ok, MD;  Location: [place];  Service: General;  Laterality: N/A;   LEFT HEART CATH AND CORONARY ANGIOGRAPHY N/A 06/30/2017   Procedure: LEFT HEART CATH AND CORONARY ANGIOGRAPHY;  Surgeon: Lorretta Harp, MD;  Location: [place] [facility];  Service: Cardiovascular;  Laterality: N/A;   LEFT HEART CATH AND CORONARY ANGIOGRAPHY N/A 12/23/2017   Procedure: LEFT HEART CATH AND CORONARY ANGIOGRAPHY;  Surgeon: Lorretta Harp, MD;  Location: [place] [facility];  Service: Cardiovascular;  Laterality: N/A;   PERIPHERAL VASCULAR INTERVENTION  07/05/2022   Procedure: PERIPHERAL VASCULAR INTERVENTION;  Surgeon: Marty Heck, MD;  Location: [facility];  Service: Cardiovascular;;  SMA   PERIPHERAL VASCULAR THROMBECTOMY N/A 07/05/2022   Procedure: PERIPHERAL VASCULAR THROMBECTOMY;  Surgeon: Marty Heck, MD;  Location: [place] [facility];  Service: Cardiovascular;  Laterality: N/A;  SMA   TUBAL LIGATION  1990    Family History  Problem  Relation Age of Onset   Diabetes Mother    Stroke Mother 37   Hypertension Mother    Hyperlipidemia Father    Stroke Father 86   Hypertension Father    Breast cancer Paternal Grandmother     SOCIAL HISTORY: Social History   Socioeconomic History   Marital status: Widowed    Spouse name: Not on file   Number of children: 2   Years of education: Not on file    Highest education level: Not on file  Occupational History   Occupation: Chiropodist: Doctor, general practice  Tobacco Use   Smoking status: Some Days    Packs/day: 1.00    Years: 29.00    Additional pack years: 0.00    Total pack years: 29.00    Types: Cigarettes    Last attempt to quit: 03/19/2018    Years since quitting: 4.4   Smokeless tobacco: Never   Tobacco comments:    11/09/2021 Patient smokes some days  started back in 2023  Vaping Use   Vaping Use: Never used  Substance and Sexual Activity   Alcohol use: Not Currently    Comment: /no   Drug use: Never   Sexual activity: Not on file  Other Topics Concern   Not on file  Social History Narrative   Are you right handed or left handed? right   Are you currently employed ?    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 befriend   Who lives with you?    What type of home do you live in: 1 story or 2 story? two       Social Determinants of Health   Financial Resource Strain: Not on file  Food Insecurity: No Food Insecurity (05/04/2022)   Hunger Vital Sign    Worried About Running Out of Food in the Last Year: Never true    Ran Out of Food in the Last Year: Never true  Transportation Needs: No Transportation Needs (05/04/2022)   PRAPARE - Hydrologist (Medical): No    Lack of Transportation (Non-Medical): No  Physical Activity: Not on file  Stress: Not on file  Social Connections: Not on file  Intimate Partner Violence: Not At Risk (05/04/2022)   Humiliation, Afraid, Rape, and Kick questionnaire    Fear of Current or Ex-Partner: No    Emotionally Abused: No    Physically Abused: No    Sexually Abused: No    Allergies  Allergen Reactions   Penicillins Itching and Rash    Caused rash and blisters   Hydrocodone Hives and Itching    Current Outpatient Medications  Medication Sig Dispense Refill   acetaminophen  (TYLENOL) 500 MG tablet Take 1,000 mg by mouth every 6 (six) hours as needed for moderate pain.     apixaban (ELIQUIS) 5 MG TABS tablet Take 1 tablet (5 mg total) by mouth 2 (two) times daily. 60 tablet 5   aspirin EC 81 MG tablet Take 1 tablet (81 mg total) by mouth daily. Swallow whole. 30 tablet    atorvastatin (LIPITOR) 40 MG tablet Take 1 tablet (40 mg total) by mouth daily. 30 tablet 2   clopidogrel (PLAVIX) 75 MG tablet Take 1 tablet (75 mg total) by mouth daily with breakfast. 30 tablet 11   famotidine (PEPCID) 20 MG tablet Take 20 mg by mouth daily as needed for  heartburn or indigestion.     gabapentin (NEURONTIN) 300 MG capsule TAKE 1 CAPSULE(300 MG) BY MOUTH AT BEDTIME 90 capsule 0   No current facility-administered medications for this visit.    REVIEW OF SYSTEMS:  [X]  denotes positive finding, [ ]  denotes negative finding Cardiac  Comments:  Chest pain or chest pressure:    Shortness of breath upon exertion:    Short of breath when lying flat:    Irregular heart rhythm:        Vascular    Pain in calf, thigh, or hip brought on by ambulation:    Pain in feet at night that wakes you up from your sleep:     Blood clot in your veins:    Leg swelling:         Pulmonary    Oxygen at home:    Productive cough:     Wheezing:         Neurologic    Sudden weakness in arms or legs:     Sudden numbness in arms or legs:     Sudden onset of difficulty speaking or slurred speech:    Temporary loss of vision in one eye:     Problems with dizziness:         Gastrointestinal    Blood in stool:     Vomited blood:         Genitourinary    Burning when urinating:     Blood in urine:        Psychiatric    Major depression:         Hematologic    Bleeding problems:    Problems with blood clotting too easily:        Skin    Rashes or ulcers:        Constitutional    Fever or chills:      PHYSICAL EXAM: Vitals:   08/28/22 1134  BP: (!) 166/89  Pulse: 78  Resp: 14   Temp: 97.7 F (36.5 C)  TempSrc: Temporal  SpO2: 98%  Weight: 199 lb (90.3 kg)  Height: 5' 4.5" (1.638 m)    GENERAL: The patient is a well-nourished female, in no acute distress. The vital signs are documented above. CARDIAC: There is a regular rate and rhythm.  VASCULAR: Palpable femoral pulses bilatearlly PULMONARY: No respiratory distress. ABDOMEN: Midline incision healed.  No rebound or guarding.  No peritonitis.  No significant pain. MUSCULOSKELETAL: There are no major deformities or cyanosis. NEUROLOGIC: No focal weakness or paresthesias are detected. PSYCHIATRIC: The patient has a normal affect.  DATA:   Mesenteric duplex 07/31/22 shows SMA stent is patent although limited visualization.  She has known celiac disease.  Assessment/Plan:  57 y.o. female that presents for 1 month interval follow-up of her acute mesenteric ischemia. She initially had mechanical thrombectomy of the SMA with angioplasty and stenting on 05/03/2022 for acute mesenteric ischemia with SMA thrombus.  She did require laparotomy by general surgery on 05/04/2022 with findings of hemoperitoneum with no bleeding source found.   She had a slow clinical course and we got a repeat CTA last month that showed thrombus in her proximal SMA stent likely extension from chronic mural thrombus in the descending thoracic and abdominal visceral aorta. I took her back to the Cath Lab on 07/05/2022 with extension of the SMA stent into the abdominal aorta with an additional VBX.    She looks as good as I have seen her today.  Not having  any postprandial abdominal pain.  Gaining weight and back to eating.  She is getting her strength back.  We talked about her going back to work on 09/17/2022.  I discussed she stay on triple therapy with aspirin Plavix Eliquis.  I will see her in 3 months with mesenteric duplex   Marty Heck, MD [facility]: [phone]

## 2022-08-31 ENCOUNTER — Other Ambulatory Visit: Payer: Self-pay

## 2022-08-31 DIAGNOSIS — K55059 Acute (reversible) ischemia of intestine, part and extent unspecified: Secondary | ICD-10-CM

## 2022-09-03 ENCOUNTER — Ambulatory Visit: Payer: Commercial Managed Care - PPO | Admitting: Neurology

## 2022-09-03 ENCOUNTER — Other Ambulatory Visit: Payer: Self-pay | Admitting: Neurology

## 2022-09-03 ENCOUNTER — Encounter: Payer: Self-pay | Admitting: Neurology

## 2022-09-03 DIAGNOSIS — R94131 Abnormal electromyogram [EMG]: Secondary | ICD-10-CM

## 2022-09-03 DIAGNOSIS — R200 Anesthesia of skin: Secondary | ICD-10-CM

## 2022-09-03 DIAGNOSIS — R201 Hypoesthesia of skin: Secondary | ICD-10-CM

## 2022-09-03 DIAGNOSIS — R7301 Impaired fasting glucose: Secondary | ICD-10-CM

## 2022-09-03 DIAGNOSIS — G629 Polyneuropathy, unspecified: Secondary | ICD-10-CM

## 2022-09-03 DIAGNOSIS — Z131 Encounter for screening for diabetes mellitus: Secondary | ICD-10-CM | POA: Diagnosis not present

## 2022-09-03 DIAGNOSIS — E519 Thiamine deficiency, unspecified: Secondary | ICD-10-CM

## 2022-09-03 MED ORDER — GABAPENTIN 300 MG PO CAPS: 300.0000 mg | ORAL_CAPSULE | Freq: Two times a day (BID) | ORAL | 3 refills | Status: DC

## 2022-09-03 NOTE — Procedures (Signed)
[facility] Neurology  [address]  [place], [place] [address] Tel: [phone] Fax: [phone] Test Date:  09/03/2022  Patient: [name] DOB: 1966-04-19 Physician: Kai Levins, MD  Sex: Female Height: 5' 4.5" Ref Phys: Kai Levins, MD  ID#: [id number]   Technician:    History: This is a 57 year old female with numbness and tingling in bilateral lower limbs.  NCV & EMG Findings: Extensive electrodiagnostic evaluation of the right upper and lower limbs with additional nerve conduction studies of the left lower limb shows: Bilateral sural and superficial peroneal/fibular sensory responses are absent. Right median, ulnar, and radial sensory responses are within normal limits. Bilateral tibial (AH), right peroneal/fibular (EDB), right median (APB), and right ulnar (ADM) motor responses are within normal limits. Right H reflex latency is within normal limits. Chronic motor axon loss changes without active denervation changes are seen in the right tibialis anterior and medial head of gastrocnemius muscles. All other tested muscles are normal with normal motor unit configuration and recruitment patterns.  Impression: This is an abnormal study. The findings are most consistent with the following: Evidence of a large fiber sensorimotor polyneuropathy, axon loss in type, mild in degree electrically. No electrodiagnostic evidence of a right cervical (C5-C8) or right lumbosacral (L3-S1) motor radiculopathy. No electrodiagnostic evidence of a right median mononeuropathy at or distal to the wrist consistent with carpal tunnel syndrome.    ___________________________ Kai Levins, MD    Nerve Conduction Studies Motor Nerve Results    Latency Amplitude F-Lat Segment Distance CV Comment  Site (ms) Norm (mV) Norm (ms)  (cm) (m/s) Norm   Right Fibular (EDB) Motor  Ankle 3.0  < 6.0 4.2  > 2.5        Bel fib head 10.2 - 4.0 -  Bel fib head-Ankle 32 44  > 40   Pop fossa  12.3 - 3.8 -  Pop fossa-Bel fib head 9 43 -   Right Median (APB) Motor  Wrist 2.7  < 4.0 7.3  > 6.0        Elbow 8.0 - 6.5 -  Elbow-Wrist 28 53  > 50   Left Tibial (AH) Motor  Ankle 3.9  < 6.0 8.7  > 4.0        Right Tibial (AH) Motor  Ankle 3.6  < 6.0 4.7  > 4.0        Knee 12.1 - 3.8 -  Knee-Ankle 40 47  > 40   Right Ulnar (ADM) Motor  Wrist 1.75  < 3.1 11.1  > 7.0        Bel elbow 6.0 - 9.3 -  Bel elbow-Wrist 24 56  > 50   Ab elbow 7.9 - 8.7 -  Ab elbow-Bel elbow 10 53 -    Sensory Sites    Neg Peak Lat Amplitude (O-P) Segment Distance Velocity Comment  Site (ms) Norm (V) Norm  (cm) (ms)   Right Median Sensory  Wrist-Dig II 3.6  < 3.6 30  > 15 Wrist-Dig II 13    Right Radial Sensory  Forearm-Wrist 2.2  < 2.7 16  > 14 Forearm-Wrist 10    Left Superficial Fibular Sensory  14 cm-Ankle *NR  < 4.6 *NR  > 4 14 cm-Ankle 14    Right Superficial Fibular Sensory  14 cm-Ankle *NR  < 4.6 *NR  > 4 14 cm-Ankle 14    Left Sural Sensory  Calf-Lat mall *NR  < 4.6 *NR  > 4 Calf-Lat  mall 14    Right Sural Sensory  Calf-Lat mall *NR  < 4.6 *NR  > 4 Calf-Lat mall 14    Right Ulnar Sensory  Wrist-Dig V 2.9  < 3.1 22  > 10 Wrist-Dig V 11     H-Reflex Results    M-Lat H Lat H Neg Amp H-M Lat  Site (ms) (ms) Norm (mV) (ms)  Right Tibial H-Reflex  Pop fossa 6.2 34.3  < 35.0 1.01 28.1   Electromyography   Side Muscle Ins.Act Fibs Fasc Recrt Amp Dur Poly Activation Comment  Right Tib ant Nml Nml Nml *1- *1+ *1+ *1+ Nml N/A  Right Gastroc MH Nml Nml Nml *1- *1+ *1+ *1+ Nml N/A  Right Rectus fem Nml Nml Nml Nml Nml Nml Nml Nml N/A  Right Biceps fem SH Nml Nml Nml Nml Nml Nml Nml Nml N/A  Right Gluteus med Nml Nml Nml Nml Nml Nml Nml Nml N/A  Right FDI Nml Nml Nml Nml Nml Nml Nml Nml N/A  Right EIP Nml Nml Nml Nml Nml Nml Nml Nml N/A  Right Pronator teres Nml Nml Nml Nml Nml Nml Nml Nml N/A  Right Biceps Nml Nml Nml Nml Nml Nml Nml Nml N/A  Right Triceps lat hd Nml Nml Nml Nml Nml Nml Nml  Nml N/A  Right Deltoid Nml Nml Nml Nml Nml Nml Nml Nml N/A      Waveforms:  Motor             Sensory                  H-Reflex

## 2022-09-05 ENCOUNTER — Emergency Department (HOSPITAL_COMMUNITY): Payer: Commercial Managed Care - PPO

## 2022-09-05 ENCOUNTER — Emergency Department (HOSPITAL_BASED_OUTPATIENT_CLINIC_OR_DEPARTMENT_OTHER)
Admission: EM | Admit: 2022-09-05 | Discharge: 2022-09-05 | Disposition: A | Discharge status: Home / Self Care | Payer: Commercial Managed Care - PPO | Attending: Emergency Medicine | Admitting: Emergency Medicine

## 2022-09-05 ENCOUNTER — Other Ambulatory Visit: Payer: Self-pay

## 2022-09-05 ENCOUNTER — Encounter (HOSPITAL_BASED_OUTPATIENT_CLINIC_OR_DEPARTMENT_OTHER): Payer: Self-pay | Admitting: Emergency Medicine

## 2022-09-05 ENCOUNTER — Emergency Department (HOSPITAL_BASED_OUTPATIENT_CLINIC_OR_DEPARTMENT_OTHER): Payer: Commercial Managed Care - PPO

## 2022-09-05 DIAGNOSIS — Z7982 Long term (current) use of aspirin: Secondary | ICD-10-CM | POA: Diagnosis not present

## 2022-09-05 DIAGNOSIS — Z7901 Long term (current) use of anticoagulants: Secondary | ICD-10-CM | POA: Insufficient documentation

## 2022-09-05 DIAGNOSIS — Z7902 Long term (current) use of antithrombotics/antiplatelets: Secondary | ICD-10-CM | POA: Insufficient documentation

## 2022-09-05 DIAGNOSIS — M25512 Pain in left shoulder: Secondary | ICD-10-CM | POA: Diagnosis not present

## 2022-09-05 DIAGNOSIS — M5412 Radiculopathy, cervical region: Secondary | ICD-10-CM

## 2022-09-05 DIAGNOSIS — N200 Calculus of kidney: Secondary | ICD-10-CM | POA: Insufficient documentation

## 2022-09-05 DIAGNOSIS — R202 Paresthesia of skin: Secondary | ICD-10-CM

## 2022-09-05 DIAGNOSIS — M79602 Pain in left arm: Secondary | ICD-10-CM | POA: Insufficient documentation

## 2022-09-05 DIAGNOSIS — M542 Cervicalgia: Secondary | ICD-10-CM | POA: Diagnosis not present

## 2022-09-05 LAB — CBC
HCT: 39.3 % (ref 36.0–46.0)
Hemoglobin: 12.9 g/dL (ref 12.0–15.0)
MCH: 27.2 pg (ref 26.0–34.0)
MCHC: 32.8 g/dL (ref 30.0–36.0)
MCV: 82.9 fL (ref 80.0–100.0)
Platelets: 340 10*3/uL (ref 150–400)
RBC: 4.74 MIL/uL (ref 3.87–5.11)
RDW: 15.4 % (ref 11.5–15.5)
WBC: 4.7 10*3/uL (ref 4.0–10.5)
nRBC: 0 % (ref 0.0–0.2)

## 2022-09-05 LAB — COMPREHENSIVE METABOLIC PANEL
ALT: 14 U/L (ref 0–44)
AST: 17 U/L (ref 15–41)
Albumin: 3.8 g/dL (ref 3.5–5.0)
Alkaline Phosphatase: 130 U/L — ABNORMAL HIGH (ref 38–126)
Anion gap: 7 (ref 5–15)
BUN: 8 mg/dL (ref 6–20)
CO2: 26 mmol/L (ref 22–32)
Calcium: 10.1 mg/dL (ref 8.9–10.3)
Chloride: 105 mmol/L (ref 98–111)
Creatinine, Ser: 0.82 mg/dL (ref 0.44–1.00)
GFR, Estimated: >60 mL/min (ref >60)
Glucose, Bld: 129 mg/dL — ABNORMAL HIGH (ref 70–99)
Potassium: 4.1 mmol/L (ref 3.5–5.1)
Sodium: 138 mmol/L (ref 135–145)
Total Bilirubin: 0.4 mg/dL (ref 0.3–1.2)
Total Protein: 6.8 g/dL (ref 6.5–8.1)

## 2022-09-05 LAB — TROPONIN I (HIGH SENSITIVITY)
Troponin I (High Sensitivity): 3 ng/L (ref <18)
Troponin I (High Sensitivity): 3 ng/L (ref <18)

## 2022-09-05 LAB — DIFFERENTIAL
Abs Immature Granulocytes: 0.01 10*3/uL (ref 0.00–0.07)
Basophils Absolute: 0 10*3/uL (ref 0.0–0.1)
Basophils Relative: 1 %
Eosinophils Absolute: 0.2 10*3/uL (ref 0.0–0.5)
Eosinophils Relative: 3 %
Immature Granulocytes: 0 %
Lymphocytes Relative: 37 %
Lymphs Abs: 1.7 10*3/uL (ref 0.7–4.0)
Monocytes Absolute: 0.3 10*3/uL (ref 0.1–1.0)
Monocytes Relative: 5 %
Neutro Abs: 2.5 10*3/uL (ref 1.7–7.7)
Neutrophils Relative %: 54 %

## 2022-09-05 LAB — ETHANOL: Alcohol, Ethyl (B): <10 mg/dL (ref <10)

## 2022-09-05 LAB — PROTIME-INR
INR: 1.1 (ref 0.8–1.2)
Prothrombin Time: 14.6 seconds (ref 11.4–15.2)

## 2022-09-05 LAB — APTT: aPTT: 27 seconds (ref 24–36)

## 2022-09-05 MED ORDER — PREDNISONE 10 MG (21) PO TBPK: ORAL_TABLET | Freq: Every day | ORAL | 0 refills | Status: DC

## 2022-09-05 NOTE — ED Triage Notes (Signed)
Pt reports left arm pain/numbness X4 days and a stiff neck.  She denies any apparent injuries, she thought she just slept on it "funny."  Hx of blood clots. Slight right sided facial droop noted.  Pt ambulatory in room and had not trouble removing her clothing. Pt is on Elequis.    Left sided facial numbness.

## 2022-09-05 NOTE — ED Provider Notes (Signed)
[facility] Provider Note   CSN: [id number] Arrival date & time: 09/05/22  1406     History {Add pertinent medical, surgical, social history, OB history to HPI:1} Chief Complaint  Patient presents with   Arm Pain    [name] is a 57 y.o. female.  Pt is a 57y/o female with hx of anemia, sarcoidosis NSTEMI s/p stents with recent SMA intervention with percutaneous mechanical thrombectomy of the SMA with angioplasty and stenting on 05/03/2022 for acute mesenteric ischemia with SMA thrombus and then  CTA 06/23/22 showed recurrent thrombus in the SMA stent which had second thrombectomy and pt now is on Eliquis, plavix and asa who is presenting today with complaints of left-sided arm numbness, some mild facial numbness that is been present for the last 4 days.  In addition to the numbness she feels pain in her neck and shoulder.  She reports its painful to move her head from side-to-side.  However at times she feels like her arm is dead weight and hard to lift and move around.  Also at times feels a bit uncoordinated.  She denies any new symptoms in her legs but was seen by neurology earlier this week for EMGs and found to have mild peripheral neuropathy and was started on gabapentin.  She reports never having issues with her arm like this before.  She denies any chest pain, abdominal pain, issues with eating that has been associated with her SMA thrombus in the past.  She has been compliant with her medications and is still taking all 3 agents for anticoagulation and antiplatelet.  She denies any nausea, vomiting, fever, cough or shortness of breath.  She has not noticed any swelling in her arm.  The history is provided by the patient and medical records.  Arm Pain       Home Medications Prior to Admission medications   Medication Sig Start Date End Date Taking? Authorizing Provider  acetaminophen (TYLENOL) 500 MG tablet Take 1,000 mg by mouth  every 6 (six) hours as needed for moderate pain.    [provider]  apixaban (ELIQUIS) 5 MG TABS tablet Take 1 tablet (5 mg total) by mouth 2 (two) times daily. 07/31/22   Marty Heck, MD  aspirin EC 81 MG tablet Take 1 tablet (81 mg total) by mouth daily. Swallow whole. 12/04/19   Lorretta Harp, MD  atorvastatin (LIPITOR) 40 MG tablet Take 1 tablet (40 mg total) by mouth daily. 05/16/22   Marty Heck, MD  clopidogrel (PLAVIX) 75 MG tablet Take 1 tablet (75 mg total) by mouth daily with breakfast. 05/16/22   Ulyses Amor, PA-C  famotidine (PEPCID) 20 MG tablet Take 20 mg by mouth daily as needed for heartburn or indigestion.    [provider]  gabapentin (NEURONTIN) 300 MG capsule Take 1 capsule (300 mg total) by mouth 2 (two) times daily. 09/03/22   Shellia Carwin, MD      Allergies    Penicillins and Hydrocodone    Review of Systems   Review of Systems  Physical Exam Updated Vital Signs BP (!) 161/81 (BP Location: Right Arm)   Pulse 89   Temp 98.1 F (36.7 C) (Oral)   Resp 16   Ht 5' 4.5" (1.638 m)   Wt 90.3 kg   SpO2 100%   BMI 33.64 kg/m  Physical Exam Vitals and nursing note reviewed.  Constitutional:      General: She is  not in acute distress.    Appearance: She is well-developed.  HENT:     Head: Normocephalic and atraumatic.  Eyes:     Pupils: Pupils are equal, round, and reactive to light.  Neck:   Cardiovascular:     Rate and Rhythm: Normal rate and regular rhythm.     Heart sounds: Normal heart sounds. No murmur heard.    No friction rub.  Pulmonary:     Effort: Pulmonary effort is normal.     Breath sounds: Normal breath sounds. No wheezing or rales.  Abdominal:     General: Bowel sounds are normal. There is no distension.     Palpations: Abdomen is soft.     Tenderness: There is no abdominal tenderness. There is no guarding or rebound.  Musculoskeletal:        General: Normal range of motion.     Cervical back:  Tenderness present. Muscular tenderness present. No spinous process tenderness.     Comments: No edema  Skin:    General: Skin is warm and dry.     Findings: No rash.  Neurological:     Mental Status: She is alert and oriented to person, place, and time.     Cranial Nerves: No cranial nerve deficit.     Motor: Pronator drift present.     Comments: Mild pronator drift in the left upper extremity.  Mild decrease strength in left hand grip and deltoid.  Patient reports decrease sensation in the left side of the face in the maxillary area as well is the mandibular area but not on the forehead.  Also decrease sensation in the entire left arm compared to the right.  Bilateral lower extremities are normal.  No visual field cuts or dysarthria.  Psychiatric:        Behavior: Behavior normal.     ED Results / Procedures / Treatments   Labs (all labs ordered are listed, but only abnormal results are displayed) Labs Reviewed  ETHANOL  PROTIME-INR  APTT  CBC  DIFFERENTIAL  COMPREHENSIVE METABOLIC PANEL  RAPID URINE DRUG SCREEN, HOSP PERFORMED  URINALYSIS, ROUTINE W REFLEX MICROSCOPIC    EKG None  Radiology No results found.  Procedures Procedures  {Document cardiac monitor, telemetry assessment procedure when appropriate:1}  Medications Ordered in ED Medications - No data to display  ED Course/ Medical Decision Making/ A&P   {   Click here for ABCD2, HEART and other calculatorsREFRESH Note before signing :1}                          Medical Decision Making Amount and/or Complexity of Data Reviewed External Data Reviewed: notes.    Details: Vascular surgery Labs: ordered. Radiology: ordered.   Pt with multiple medical problems and comorbidities and presenting today with a complaint that caries a high risk for morbidity and mortality.  Here today with symptoms of left arm weakness, numbness and pain in her neck.  Also though patient is having some sensory symptoms in her face  on the left side as well.  She does not appear to have lower extremity involvement.  Patient has significant risk factors after having SMA clot and is currently on 3 anticoagulants Eliquis, Plavix and aspirin.  Low suspicion for an acute issue with her recent surgery as she has no abdominal pain or digestive issues.  Concern for possible stroke versus acute cervical pathology.  Patient is outside of TNKase window as symptoms have been persistent  for the last 4 days.  Stroke order set initiated.  Feel that even if head CT is negative patient will need MRI of the head and neck.  Labs are pending.   {Document critical care time when appropriate:1} {Document review of labs and clinical decision tools ie heart score, Chads2Vasc2 etc:1}  {Document your independent review of radiology images, and any outside records:1} {Document your discussion with family members, caretakers, and with consultants:1} {Document social determinants of health affecting pt's care:1} {Document your decision making why or why not admission, treatments were needed:1} Final Clinical Impression(s) / ED Diagnoses Final diagnoses:  None    Rx / DC Orders ED Discharge Orders     None

## 2022-09-05 NOTE — Discharge Instructions (Signed)
Your MRI did not show any stroke, you had what is called foraminal stenosis which is narrowing of the area that the nerve roots emerged in the spine, I think this is what is contributing to the arm pain, numbness.  We will try steroids that I sent to your pharmacy, I recommend that you follow-up with the neurosurgeon whose contact information I provided at your earliest convenience, return to the emergency department if your symptoms worsen or fail to improve despite treatment

## 2022-09-05 NOTE — ED Provider Notes (Signed)
57 yo female w/ hx of CAD s/p stent, SMA thrombus w/ thrombectomy and stent, on eliquis, plavix and aspirin, presenting with weakness in her left arm and paresthesias in her left arm and face.  Symptoms ongoing for 4 days.  Diagnosed with peripheral neuropathy in recent past.   Physical Exam  BP (!) 161/81 (BP Location: Right Arm)   Pulse 89   Temp 98.1 F (36.7 C) (Oral)   Resp 16   Ht 5' 4.5" (1.638 m)   Wt 90.3 kg   SpO2 100%   BMI 33.64 kg/m   Physical Exam  Procedures  Procedures  ED Course / MDM    Medical Decision Making Amount and/or Complexity of Data Reviewed Labs: ordered. Radiology: ordered.   Patient reassessed.  Labs are unremarkable; [place] with no acute findings.  She continues to report a sensation of heaviness in her left arm.  She does not have objective weakness, reports paresthesias in the entire arm.  I do not see any swelling of the arm.  She also reported paresthesia in the left face.  She has significant tenderness tracking along her left upper thoracic back along the suprascapular region, with trigger point tenderness.  I wonder whether she may be experiencing a cervical neuropathy.  However with the facial involvement I think transferring for MRI is still reasonable.    I have also asked the nurse to add on a troponin level.  She does not have any active chest pain per se, and her EKG is nonischemic per my review, but she does have a coronary history including a cardiac stent.    I doubt upper extremity thrombus on exam; no swelling of the upper extremity or plethora of the face.  We will transfer to [facility] ED for MRI.  Dr Tyrone Nine is accepting.  The patient is stable for transfer at this time.  Patient is in agreement the plan for transfer.    Wyvonnia Dusky, MD 09/05/22 1536

## 2022-09-05 NOTE — ED Notes (Signed)
Lab will add on trop.

## 2022-09-05 NOTE — ED Provider Notes (Signed)
This is an overall well-appearing 57 year old female presents with concern for left arm pain, numbness, facial paresthesia for 4 days, she felt that she slept on her arm wrong.  She was seen and evaluated earlier today at [facility] emergency department by Dr. Anitra Lauth.  Plan at that time was due to some weakness of the left upper extremity, decree strength for grip, decrease sensation of the left side of the face and arm compared to the right for her to present for MR brain, and MRI C-spine.   I independently interpreted MR brain without contrast, MR C-spine without contrast which shows some significant cervical foraminal stenosis as well as canal stenosis without any evidence of stroke or intracranial abnormality, given this I suspect that her symptoms are secondary to cervical nerve impingement versus radiculopathy, versus inflammation of her mild canal stenosis, we will discharge with prednisone, neurosurgery follow-up.  Patient understands and agrees to plan, is discharged in stable condition.   [name] 09/05/22 1925    [name], MD 09/06/22 1243

## 2022-10-28 ENCOUNTER — Other Ambulatory Visit: Payer: Self-pay | Admitting: Vascular Surgery

## 2022-11-13 ENCOUNTER — Ambulatory Visit: Payer: Commercial Managed Care - PPO | Admitting: Vascular Surgery

## 2022-11-13 ENCOUNTER — Encounter: Payer: Self-pay | Admitting: Vascular Surgery

## 2022-11-13 ENCOUNTER — Ambulatory Visit (HOSPITAL_COMMUNITY)
Admission: RE | Admit: 2022-11-13 | Discharge: 2022-11-13 | Disposition: A | Discharge status: Home / Self Care | Payer: Commercial Managed Care - PPO | Source: Ambulatory Visit | Attending: Vascular Surgery | Admitting: Vascular Surgery

## 2022-11-13 VITALS — BP 145/84 | HR 76 | Temp 97.7°F | Resp 14 | Ht 64.5 in | Wt 201.0 lb

## 2022-11-13 DIAGNOSIS — K55059 Acute (reversible) ischemia of intestine, part and extent unspecified: Secondary | ICD-10-CM

## 2022-11-13 NOTE — Progress Notes (Signed)
Patient name: [name] MRN: [id number] DOB: [birth year] Sex: female  REASON FOR CONSULT: 3 month follow-up after recent SMA intervention    HPI: [name] is a 57 y.o. female, that presents for 3 month follow-up after recent SMA intervention.  She initially underwent percutaneous mechanical thrombectomy of the SMA with angioplasty and stenting on 05/03/2022 for acute mesenteric ischemia with SMA thrombus.  She did require ex lap with all viable bowel but had evidence of hemoperitoneum with no bleeding source identified by general surgery. She had a slow hospital course but ultimately was discharged home on aspirin and Plavix.     I subsequently sent her for CTA abdomen pelvis given her slow clinical progress.  Her CTA 06/23/22 showed recurrent thrombus in the SMA stent with associated thrombus in her visceral aorta as demonstrated on previous imaging.  I started her on Eliquis.  I took her back to the Cath Lab on 07/05/2022 for percutaneous thrombectomy of the SMA stent and extension of the SMA stent into the abdominal aorta.  She was initially doing well.  About 2 weeks ago started having postprandial pain again.  States she has diarrhea on a daily basis.  Her abdominal pain is crampy after eating.  Past Medical History:  Diagnosis Date   Anemia    Fibroid    Hyperlipidemia    NSTEMI (non-ST elevated myocardial infarction) (HCC) 06/2017   s/p DES to LCX 07/02/17   Sarcoidosis     Past Surgical History:  Procedure Laterality Date   ABDOMINAL AORTOGRAM N/A 07/05/2022   Procedure: ABDOMINAL AORTOGRAM;  Surgeon: Cephus Shelling, MD;  Location: [facility] INVASIVE CV LAB;  Service: Cardiovascular;  Laterality: N/A;   ABDOMINAL HYSTERECTOMY     BREAST BIOPSY Right 03/24/2018   BREAST REDUCTION SURGERY Bilateral 10/17/2020   Procedure: MAMMARY REDUCTION  (BREAST);  Surgeon: Allena Napoleon, MD;  Location: [facility] OR;  Service: Plastics;  Laterality: Bilateral;  2 hours   CESAREAN SECTION   1987   CORONARY/GRAFT ACUTE MI REVASCULARIZATION N/A 06/30/2017   Procedure: Coronary/Graft Acute MI Revascularization;  Surgeon: Runell Gess, MD;  Location: [facility] INVASIVE CV LAB;  Service: Cardiovascular;  Laterality: N/A;   FOREARM FRACTURE SURGERY Right 1977   FRACTURE SURGERY     LAPAROSCOPY N/A 05/04/2022   Procedure: LAPAROSCOPY DIAGNOSTIC;  Surgeon: Axel Filler, MD;  Location: [facility] OR;  Service: General;  Laterality: N/A;   LAPAROTOMY N/A 05/04/2022   Procedure: EXPLORATORY LAPAROTOMY;  Surgeon: Axel Filler, MD;  Location[facility] OR;  Service: General;  Laterality: N/A;   LEFT HEART CATH AND CORONARY ANGIOGRAPHY N/A 06/30/2017   Procedure: LEFT HEART CATH AND CORONARY ANGIOGRAPHY;  Surgeon: Runell Gess, MD;  Location: [facility] INVASIVE CV LAB;  Service: Cardiovascular;  Laterality: N/A;   LEFT HEART CATH AND CORONARY ANGIOGRAPHY N/A 12/23/2017   Procedure: LEFT HEART CATH AND CORONARY ANGIOGRAPHY;  Surgeon: Runell Gess, MD;  Location: [facility] INVASIVE CV LAB;  Service: Cardiovascular;  Laterality: N/A;   PERIPHERAL VASCULAR INTERVENTION  07/05/2022   Procedure: PERIPHERAL VASCULAR INTERVENTION;  Surgeon: Cephus Shelling, MD;  Location: [facility] INVASIVE CV LAB;  Service: Cardiovascular;;  SMA   PERIPHERAL VASCULAR THROMBECTOMY N/A 07/05/2022   Procedure: PERIPHERAL VASCULAR THROMBECTOMY;  Surgeon: Cephus Shelling, MD;  Location: [facility] INVASIVE CV LAB;  Service: Cardiovascular;  Laterality: N/A;  SMA   TUBAL LIGATION  1990    Family History  Problem Relation Age of Onset   Diabetes Mother  Stroke Mother 52   Hypertension Mother    Hyperlipidemia Father    Stroke Father 54   Hypertension Father    Breast cancer Paternal Grandmother     SOCIAL HISTORY: Social History   Socioeconomic History   Marital status: Widowed    Spouse name: Not on file   Number of children: 2   Years of education: Not on file   Highest education level: Not on file  Occupational History    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 right   Are you currently employed ?    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 befriend   Who lives with you?    What type of home do you live in: 1 story or 2 story? two       Social Determinants of Health   Financial Resource Strain: Not on file  Food Insecurity: No Food Insecurity (05/04/2022)   Hunger Vital Sign    Worried About Running Out of Food in the Last Year: Never true    Ran Out of Food in the Last Year: Never true  Transportation Needs: No Transportation Needs (05/04/2022)   PRAPARE - Administrator, Civil Service (Medical): No    Lack of Transportation (Non-Medical): No  Physical Activity: Not on file  Stress: Not on file  Social Connections: Not on file  Intimate Partner Violence: Not At Risk (05/04/2022)   Humiliation, Afraid, Rape, and Kick questionnaire    Fear of Current or Ex-Partner: No    Emotionally Abused: No    Physically Abused: No    Sexually Abused: No    Allergies  Allergen Reactions   Penicillins Itching and Rash    Caused rash and blisters   Hydrocodone Hives and Itching    Current Outpatient Medications  Medication Sig Dispense Refill   acetaminophen (TYLENOL) 500 MG tablet Take 1,000 mg by mouth every 6 (six)  hours as needed for moderate pain.     apixaban (ELIQUIS) 5 MG TABS tablet Take 1 tablet (5 mg total) by mouth 2 (two) times daily. 60 tablet 5   aspirin EC 81 MG tablet Take 1 tablet (81 mg total) by mouth daily. Swallow whole. 30 tablet    atorvastatin (LIPITOR) 40 MG tablet TAKE 1 TABLET(40 MG) BY MOUTH DAILY 30 tablet 0   clopidogrel (PLAVIX) 75 MG tablet Take 1 tablet (75 mg total) by mouth daily with breakfast. 30 tablet 11   famotidine (PEPCID) 20 MG tablet Take 20 mg by mouth daily as needed for heartburn or indigestion.     gabapentin (NEURONTIN) 300 MG capsule Take  1 capsule (300 mg total) by mouth 2 (two) times daily. 180 capsule 3   predniSONE (STERAPRED UNI-PAK 21 TAB) 10 MG (21) TBPK tablet Take by mouth daily. Take 6 tabs by mouth daily  for 2 days, then 5 tabs for 2 days, then 4 tabs for 2 days, then 3 tabs for 2 days, 2 tabs for 2 days, then 1 tab by mouth daily for 2 days (Patient not taking: Reported on 11/13/2022) 42 tablet 0   No current facility-administered medications for this visit.    REVIEW OF SYSTEMS:  [X]  denotes positive finding, [ ]  denotes negative finding Cardiac  Comments:  Chest pain or chest pressure:    Shortness of breath upon exertion:    Short of breath when lying flat:    Irregular heart rhythm:        Vascular    Pain in calf, thigh, or hip brought on by ambulation:    Pain in feet at night that wakes you up from your sleep:     Blood clot in your veins:    Leg swelling:         Pulmonary    Oxygen at home:    Productive cough:     Wheezing:         Neurologic    Sudden weakness in arms or legs:     Sudden numbness in arms or legs:     Sudden onset of difficulty speaking or slurred speech:    Temporary loss of vision in one eye:     Problems with dizziness:         Gastrointestinal    Blood in stool:     Vomited blood:         Genitourinary    Burning when urinating:     Blood in urine:        Psychiatric    Major depression:          Hematologic    Bleeding problems:    Problems with blood clotting too easily:        Skin    Rashes or ulcers:        Constitutional    Fever or chills:      PHYSICAL EXAM: Vitals:   11/13/22 0901  BP: (!) 145/84  Pulse: 76  Resp: 14  Temp: 97.7 F (36.5 C)  TempSrc: Temporal  SpO2: 96%  Weight: 201 lb (91.2 kg)  Height: 5' 4.5" (1.638 m)    GENERAL: The patient is a well-nourished female, in no acute distress. The vital signs are documented above. CARDIAC: There is a regular rate and rhythm.  VASCULAR: Palpable femoral pulses bilatearlly PULMONARY: No respiratory distress. ABDOMEN: Midline incision healed.  No rebound or guarding.  No peritonitis.  No significant pain on my exam.. MUSCULOSKELETAL: There are no major deformities or cyanosis. NEUROLOGIC: No focal weakness or paresthesias are detected. PSYCHIATRIC: The patient has a normal affect.  DATA:   Difficult visualization of SMA stent today.  Assessment/Plan:  57 y.o. female that presents for 3 month interval follow-up of her acute mesenteric ischemia. She initially had mechanical thrombectomy of the SMA with angioplasty and stenting on 05/03/2022 for acute mesenteric ischemia with SMA thrombus.  She did require laparotomy by general surgery on 05/04/2022 with findings of hemoperitoneum with no bleeding source found.   She had a slow clinical course and we got a repeat CTA last month that showed thrombus in her proximal SMA stent likely extension from chronic  mural thrombus in the descending thoracic and abdominal visceral aorta. I took her back to the Cath Lab on 07/05/2022 with extension of the SMA stent into the abdominal aorta with an additional VBX.    She was initially doing well and now about 2 weeks ago started having increasing postprandial pain again.  States has been compliant with her aspirin Plavix and Eliquis for triple therapy.  I have recommended a repeat CTA abdomen pelvis given her stent was  difficult to visualize on duplex today.   Cephus Shelling, MD [facility] Office: [phone]

## 2022-11-15 ENCOUNTER — Other Ambulatory Visit: Payer: Self-pay

## 2022-11-15 DIAGNOSIS — K55059 Acute (reversible) ischemia of intestine, part and extent unspecified: Secondary | ICD-10-CM

## 2022-12-01 ENCOUNTER — Other Ambulatory Visit: Payer: Self-pay | Admitting: Vascular Surgery

## 2022-12-04 ENCOUNTER — Ambulatory Visit (HOSPITAL_COMMUNITY)
Admission: RE | Admit: 2022-12-04 | Discharge: 2022-12-04 | Disposition: A | Discharge status: Home / Self Care | Payer: Commercial Managed Care - PPO | Source: Ambulatory Visit | Attending: Vascular Surgery | Admitting: Vascular Surgery

## 2022-12-04 DIAGNOSIS — K55059 Acute (reversible) ischemia of intestine, part and extent unspecified: Secondary | ICD-10-CM | POA: Diagnosis present

## 2022-12-04 MED ORDER — IOHEXOL 350 MG/ML SOLN
100.0000 mL | Freq: Once | INTRAVENOUS | Status: AC | PRN
  Administered 2022-12-04: 100 mL via INTRAVENOUS

## 2022-12-04 NOTE — Progress Notes (Signed)
I saw [name] in neurology clinic on 12/14/22 in follow up for neuropathy.  HPI: [name] is a 57 y.o. year old female with a history of sarcoidosis, HLD, CAD c/b NSTEMI s/p stent, PVD c/b recent SMA thrombus s/p angioplasty and stent (04/2022), former smoker who we last saw on 06/15/22.  To briefly review: Patient has numbness and tingling in bilateral feet to her knees. She thinks the right foot may be worse than the left. The inside of her feet will feel cold or hot. This has been present for at least 6 months. She mentions she can no longer walk in heels due to pain. She denies weakness in legs, imbalance, or falls. She sometimes has numbness/pain in the hands as well. Patient was seen at [facility] by Dr. Lilian Kapur. She was started on gabapentin 300 mg qhs. It helps her sleep and the pain at night. She finds that the next morning the gabapentin can leave her feeling tired and off balance. She has been on this for a few weeks.   She endorses cramps in her legs, particularly at night, that can wake her up.   She denies any constitutional symptoms like fever, night sweats, anorexia or unintentional weight loss.   EtOH use: Very rare Restrictive diet? No Family history of neuropathy/myopathy/neurologic disease? No  Most recent Assessment and Plan (06/15/22): [name] is a 57 y.o. female who presents for evaluation of numbness and tingling in bilateral lower extremities. She has a relevant medical history of sarcoidosis, HLD, CAD c/b NSTEMI s/p stent, PVD c/b recent SMA thrombus s/p angioplasty and stent (04/2022), former smoker. Her neurological examination is pertinent for diminished distal sensation in lower extremities. Available diagnostic data is significant for B12 of 1241, ANA and ESR wnl. Patient's symptoms are most consistent with a distal symmetric polyneuropathy. I will get an EMG and look for treatable causes as below.   PLAN: -Blood work:  HbA1c, IFE, B1 -EMG: PN protocol (R > L) -Gabapentin 300 mg qhs -Lidocaine cream PRN  Since their last visit: Patient drives a city bus. She sits all day and has noticed a lot of swelling in her legs. She has shooting pain and burning in her feet. She has great difficulty with pain, especially at night.   IFE showed no M protein. B1 was < 6. I recommended supplementation with B1 100 mg daily. Patient has been taking. HbA1c was not collected.  EMG on 09/03/22 was consistent with a mild axonal length dependent polyneuropathy. No abnormalities of the right upper limb were found.  Her gabapentin was increased to 300 mg BID on 09/03/22 as well. This has helped some, but not enough.  Patient presented to the ED on 09/05/22 for left arm pain, numbness, and facial paresthesia (?for 4 days) feeling like she slept on her arm wrong. MRI brain was normal. MRI cervical spine showed moderate to severe left C5-6 foraminal stenosis and moderate bilateral C4-5 foraminal stenosis. Patient was given prednisone and NSGY outpatient follow up. She continues to have this pain. She has not seen NSGY. She has not done any physical therapy.  Patient again denies EtOH. She denies restrictive diet or weight loss.   MEDICATIONS:  Outpatient Encounter Medications as of 12/14/2022  Medication Sig   acetaminophen (TYLENOL) 500 MG tablet Take 1,000 mg by mouth every 6 (six) hours as needed for moderate pain.   apixaban (ELIQUIS) 5 MG TABS tablet Take 1 tablet (5 mg total)  by mouth 2 (two) times daily.   aspirin EC 81 MG tablet Take 1 tablet (81 mg total) by mouth daily. Swallow whole.   atorvastatin (LIPITOR) 40 MG tablet TAKE 1 TABLET(40 MG) BY MOUTH DAILY   clopidogrel (PLAVIX) 75 MG tablet Take 1 tablet (75 mg total) by mouth daily with breakfast.   famotidine (PEPCID) 20 MG tablet Take 20 mg by mouth daily as needed for heartburn or indigestion.   gabapentin (NEURONTIN) 300 MG capsule Take 1 capsule (300 mg total) by  mouth 2 (two) times daily.   [DISCONTINUED] predniSONE (STERAPRED UNI-PAK 21 TAB) 10 MG (21) TBPK tablet Take by mouth daily. Take 6 tabs by mouth daily  for 2 days, then 5 tabs for 2 days, then 4 tabs for 2 days, then 3 tabs for 2 days, 2 tabs for 2 days, then 1 tab by mouth daily for 2 days (Patient not taking: Reported on 12/14/2022)   No facility-administered encounter medications on file as of 12/14/2022.    PAST MEDICAL HISTORY: Past Medical History:  Diagnosis Date   Anemia    Fibroid    Hyperlipidemia    NSTEMI (non-ST elevated myocardial infarction) (HCC) 06/2017   s/p DES to LCX 07/02/17   Sarcoidosis     PAST SURGICAL HISTORY: Past Surgical History:  Procedure Laterality Date   ABDOMINAL AORTOGRAM N/A 07/05/2022   Procedure: ABDOMINAL AORTOGRAM;  Surgeon: Cephus Shelling, MD;  Location: [facility];  Service: Cardiovascular;  Laterality: N/A;   ABDOMINAL HYSTERECTOMY     BREAST BIOPSY Right 03/24/2018   BREAST REDUCTION SURGERY Bilateral 10/17/2020   Procedure: MAMMARY REDUCTION  (BREAST);  Surgeon: Allena Napoleon, MD;  Location: [facility] OR;  Service: Plastics;  Laterality: Bilateral;  2 hours   CESAREAN SECTION  1987   CORONARY/GRAFT ACUTE MI REVASCULARIZATION N/A 06/30/2017   Procedure: Coronary/Graft Acute MI Revascularization;  Surgeon: Runell Gess, MD;  Location: [facility];  Service: Cardiovascular;  Laterality: N/A;   FOREARM FRACTURE SURGERY Right 1977   FRACTURE SURGERY     LAPAROSCOPY N/A 05/04/2022   Procedure: LAPAROSCOPY DIAGNOSTIC;  Surgeon: Axel Filler, MD;  Location: [facility] OR;  Service: General;  Laterality: N/A;   LAPAROTOMY N/A 05/04/2022   Procedure: EXPLORATORY LAPAROTOMY;  Surgeon: Axel Filler, MD;  Location: [facility] OR;  Service: General;  Laterality: N/A;   LEFT HEART CATH AND CORONARY ANGIOGRAPHY N/A 06/30/2017   Procedure: LEFT HEART CATH AND CORONARY ANGIOGRAPHY;  Surgeon: Runell Gess, MD;  Location: [facility];   Service: Cardiovascular;  Laterality: N/A;   LEFT HEART CATH AND CORONARY ANGIOGRAPHY N/A 12/23/2017   Procedure: LEFT HEART CATH AND CORONARY ANGIOGRAPHY;  Surgeon: Runell Gess, MD;  Location: [facility];  Service: Cardiovascular;  Laterality: N/A;   PERIPHERAL VASCULAR INTERVENTION  07/05/2022   Procedure: PERIPHERAL VASCULAR INTERVENTION;  Surgeon: Cephus Shelling, MD;  Location: [facility];  Service: Cardiovascular;;  SMA   PERIPHERAL VASCULAR THROMBECTOMY N/A 07/05/2022   Procedure: PERIPHERAL VASCULAR THROMBECTOMY;  Surgeon: Cephus Shelling, MD;  Location: [facility];  Service: Cardiovascular;  Laterality: N/A;  SMA   TUBAL LIGATION  1990    ALLERGIES: Allergies  Allergen Reactions   Penicillins Itching and Rash    Caused rash and blisters   Hydrocodone Hives and Itching    FAMILY HISTORY: Family History  Problem Relation Age of Onset   Diabetes Mother    Stroke Mother 61  Hypertension Mother    Hyperlipidemia Father    Stroke Father 36   Hypertension Father    Breast cancer Paternal Grandmother     SOCIAL HISTORY: Social History   Tobacco Use   Smoking status: Some Days    Packs/day: 1.00    Years: 29.00    Additional pack years: 0.00    Total pack years: 29.00    Types: Cigarettes    Last attempt to quit: 03/19/2018    Years since quitting: 4.7   Smokeless tobacco: Never   Tobacco comments:    11/09/2021 Patient smokes some days  started back in 2023  Vaping Use   Vaping Use: Never used  Substance Use Topics   Alcohol use: Not Currently    Comment: /no   Drug use: Never   Social History   Social History Narrative   Are you right handed or left handed? right   Are you currently employed ?    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 befriend   Who lives with you?    What type of home do you live in: 1 story or 2 story? two        Objective:  Vital Signs:   BP (!) 140/73   Pulse 98   Ht 5' 4.5" (1.638 m)   Wt 206 lb (93.4 kg)   SpO2 98%   BMI 34.81 kg/m   General: General appearance: Awake and alert. No distress. Cooperative with exam.  Skin: No obvious rash or jaundice. HEENT: Atraumatic. Anicteric. Lungs: Non-labored breathing on room air  Extremities: Mild bilateral lower extermity edema.  Neurological: Mental Status: Alert. Speech fluent. No pseudobulbar affect Cranial Nerves: CNII: No RAPD. Visual fields intact. CNIII, IV, VI: PERRL. No nystagmus. EOMI. CN V: Facial sensation intact bilaterally to fine touch. CN VII: Facial muscles symmetric and strong. No ptosis at rest. CN VIII: Hears finger rub well bilaterally. CN IX: No hypophonia. CN X: Palate elevates symmetrically. CN XI: Full strength shoulder shrug bilaterally. CN XII: Tongue protrusion full and midline. No atrophy or fasciculations. No significant dysarthria Motor: Tone is normal. Strength 5/5 in bilateral upper and lower extremities. Reflexes: 2+ throughout Sensation: Pinprick: Diminished in bilateral feet Coordination: Intact finger-to- nose-finger bilaterally. Romberg negative. Gait: Able to rise from chair with arms crossed unassisted. Normal, narrow-based gait.   Lab and Test Review: New results: 06/15/22: B1: < 6 IFE: no M protein  Lipid panel (07/10/22): LDL 72 CMP (09/05/22): elevated glucose to 129, Alk Phos 130  MRI brain and cervical spine wo contrast (09/05/22): FINDINGS: MRI HEAD FINDINGS   Brain: No acute infarction, hemorrhage, hydrocephalus, extra-axial collection or mass lesion.   Vascular: Major arterial flow voids are maintained at the skull base.   Skull and upper cervical spine: Normal marrow signal.   Sinuses/Orbits: Clear sinuses.  No acute orbital findings.   Other: No mastoid effusions.   MRI CERVICAL SPINE FINDINGS   Alignment: No substantial sagittal subluxation.   Vertebrae: No fracture, evidence of discitis, or  bone lesion.   Cord: Normal cord signal.   Posterior Fossa, vertebral arteries, paraspinal tissues: Visualized vertebral artery flow voids are maintained.   Disc levels:   C2-C3: No significant disc protrusion, foraminal stenosis, or canal stenosis.   C3-C4: Small central disc protrusion and right greater than left facet arthropathy without significant stenosis.   C4-C5: Bilateral facet and uncovertebral hypertrophy with moderate bilateral foraminal stenosis.  Mild canal stenosis.   C5-C6: Posterior disc osteophyte complex with left greater than right facet and uncovertebral hypertrophy. Resulting moderate to severe left foraminal stenosis. No significant canal stenosis.   C6-C7: Left greater than right facet arthropathy. Mild left foraminal stenosis. Patent canal and right foramen.   C7-T1: No significant disc protrusion, foraminal stenosis, or canal stenosis.   IMPRESSION: MRI head:   Normal brain MRI for patient age.  No acute abnormality.   MRI cervical spine:   1. At C5-C6, moderate to severe left foraminal stenosis. 2. At C4-C5, moderate bilateral foraminal stenosis and mild canal stenosis. 3. At C6-C7, mild left foraminal stenosis.  EMG (09/03/22): NCV & EMG Findings: Extensive electrodiagnostic evaluation of the right upper and lower limbs with additional nerve conduction studies of the left lower limb shows: Bilateral sural and superficial peroneal/fibular sensory responses are absent. Right median, ulnar, and radial sensory responses are within normal limits. Bilateral tibial (AH), right peroneal/fibular (EDB), right median (APB), and right ulnar (ADM) motor responses are within normal limits. Right H reflex latency is within normal limits. Chronic motor axon loss changes without active denervation changes are seen in the right tibialis anterior and medial head of gastrocnemius muscles. All other tested muscles are normal with normal motor unit configuration and  recruitment patterns.   Impression: This is an abnormal study. The findings are most consistent with the following: Evidence of a large fiber sensorimotor polyneuropathy, axon loss in type, mild in degree electrically. No electrodiagnostic evidence of a right cervical (C5-C8) or right lumbosacral (L3-S1) motor radiculopathy. No electrodiagnostic evidence of a right median mononeuropathy at or distal to the wrist consistent with carpal tunnel syndrome.  Previously reviewed results: BMP (05/04/22) significant for elevated glucose to 191 B12 (04/12/22): 1241 Folate (04/12/22): wnl ANA (04/12/22): negative ESR (04/12/22): wnl   Imaging: MRI lumbar spine wo contrast (03/01/21): FINDINGS: Segmentation:  Standard.   Alignment:  Physiologic.   Vertebrae: No fracture, evidence of discitis, or suspicious bone lesion. There are a few scattered small intraosseous hemangiomas incidentally noted.   Conus medullaris and cauda equina: Conus extends to the L1 level. Conus and cauda equina appear normal.   Paraspinal and other soft tissues: Negative.   Disc levels:   T12-L1: No significant disc protrusion, foraminal stenosis, or canal stenosis.   L1-L2: No significant disc protrusion, foraminal stenosis, or canal stenosis.   L2-L3: Minimal annular disc bulge. Mild bilateral facet arthropathy. No foraminal or canal stenosis.   L3-L4: Diffuse disc bulge with left paracentral/foraminal protrusion. Slight cranial extension of disc material within the subarticular zone. Mild bilateral facet arthropathy. Findings result in mild canal stenosis with mild left subarticular recess stenosis and borderline-mild left foraminal stenosis.   L4-L5: No disc protrusion. Mild bilateral facet arthropathy. No foraminal or canal stenosis.   L5-S1: Shallow central disc protrusion. Mild bilateral facet arthropathy. No foraminal or canal stenosis.   IMPRESSION: Mild degenerative changes of the lumbar spine,  as described above. Findings are most pronounced at L3-L4 where there is mild canal stenosis, mild left subarticular recess stenosis, and borderline-mild left foraminal stenosis.  ASSESSMENT: [name], a 57 y.o. female with a distal symmetric polyneuropathy, mild in degree on EMG. Her B1 was very low (thiamine deficiency), which is her only known risk factor currently. She denies EtOH use or malnutrition/losing weight. While there was no radiculopathy by EMG, recent MRI cervical spine showed moderate to severe left foraminal stenosis, which is likely the cause of her neck pain  and symptoms in her left arm.  Plan: -Blood work: HbA1c, B1 -Physical therapy for cervical radiculopathy. -Will consider NSGY referral if she does not improve with PT -Continue B1 100 mg daily -Increase gabapentin to 300 mg in morning and 600 mg at bedtime   Return to clinic in 6 months  Total time spent reviewing records, interview, history/exam, documentation, and coordination of care on day of encounter:  35 min  Jacquelyne Balint, MD

## 2022-12-05 ENCOUNTER — Telehealth: Payer: Self-pay

## 2022-12-05 NOTE — Telephone Encounter (Signed)
Ruby from [facility] called with call report on pt's CTA. I have sent MD a staff message making him aware results are accessible. Pt is scheduled for MD f/u on 12/18/22.

## 2022-12-14 ENCOUNTER — Other Ambulatory Visit: Payer: Commercial Managed Care - PPO

## 2022-12-14 ENCOUNTER — Ambulatory Visit: Payer: Commercial Managed Care - PPO | Admitting: Neurology

## 2022-12-14 ENCOUNTER — Encounter: Payer: Self-pay | Admitting: Neurology

## 2022-12-14 VITALS — BP 140/73 | HR 98 | Ht 64.5 in | Wt 206.0 lb

## 2022-12-14 DIAGNOSIS — R202 Paresthesia of skin: Secondary | ICD-10-CM

## 2022-12-14 DIAGNOSIS — R200 Anesthesia of skin: Secondary | ICD-10-CM

## 2022-12-14 DIAGNOSIS — E519 Thiamine deficiency, unspecified: Secondary | ICD-10-CM

## 2022-12-14 DIAGNOSIS — G629 Polyneuropathy, unspecified: Secondary | ICD-10-CM | POA: Diagnosis not present

## 2022-12-14 DIAGNOSIS — M542 Cervicalgia: Secondary | ICD-10-CM

## 2022-12-14 DIAGNOSIS — R7301 Impaired fasting glucose: Secondary | ICD-10-CM

## 2022-12-14 DIAGNOSIS — R739 Hyperglycemia, unspecified: Secondary | ICD-10-CM

## 2022-12-14 DIAGNOSIS — M5412 Radiculopathy, cervical region: Secondary | ICD-10-CM

## 2022-12-14 DIAGNOSIS — Z131 Encounter for screening for diabetes mellitus: Secondary | ICD-10-CM

## 2022-12-14 MED ORDER — GABAPENTIN 300 MG PO CAPS: ORAL_CAPSULE | ORAL | 3 refills | Status: DC

## 2022-12-14 NOTE — Patient Instructions (Addendum)
I will get lab work today.  I am referring you to physical therapy for the neck pain and pain in arms and legs.  Your B1 was low. Continue taking B1 (thiamine) 100 mg daily. This can be bought over the counter at any drug store or online.  Increase Gabapentin to 300 mg in morning and 600 mg at night. I sent a new prescription for this to your pharmacy.  I want to see you back in clinic in 6 months.  The physicians and staff at [facility] are committed to providing excellent care. You may receive a survey requesting feedback about your experience at our office. We strive to receive "very good" responses to the survey questions. If you feel that your experience would prevent you from giving the office a "very good " response, please contact our office to try to remedy the situation. We may be reached at [phone]. Thank you for taking the time out of your busy day to complete the survey.  Jacquelyne Balint, MD [facility]

## 2022-12-15 LAB — HEMOGLOBIN A1C: Hgb A1c MFr Bld: 6.4 % of total Hgb — ABNORMAL HIGH (ref <5.7)

## 2022-12-17 ENCOUNTER — Encounter: Payer: Self-pay | Admitting: Neurology

## 2022-12-17 LAB — HEMOGLOBIN A1C
Mean Plasma Glucose: 137 mg/dL
eAG (mmol/L): 7.6 mmol/L

## 2022-12-17 LAB — VITAMIN B1: Vitamin B1 (Thiamine): 6 nmol/L — ABNORMAL LOW (ref 8–30)

## 2022-12-18 ENCOUNTER — Ambulatory Visit: Payer: Commercial Managed Care - PPO | Admitting: Vascular Surgery

## 2022-12-18 ENCOUNTER — Encounter: Payer: Self-pay | Admitting: Vascular Surgery

## 2022-12-18 ENCOUNTER — Other Ambulatory Visit: Payer: Self-pay

## 2022-12-18 VITALS — BP 129/84 | HR 82 | Temp 97.6°F | Resp 14 | Ht 64.5 in | Wt 206.0 lb

## 2022-12-18 DIAGNOSIS — K55069 Acute infarction of intestine, part and extent unspecified: Secondary | ICD-10-CM | POA: Diagnosis not present

## 2022-12-18 DIAGNOSIS — K551 Chronic vascular disorders of intestine: Secondary | ICD-10-CM | POA: Insufficient documentation

## 2022-12-18 NOTE — Progress Notes (Signed)
Patient name: [name] MRN: [id number] DOB: [birth year] Sex: female  REASON FOR CONSULT: Follow-up after CTA for previous SMA intervention for acute mesenteric ischemia  HPI: [name] is a 57 y.o. female, that presents for follow-up after CTA after multiple previous SMA interventions.   She initially underwent percutaneous mechanical thrombectomy of the SMA with angioplasty and stenting on 05/03/2022 for acute mesenteric ischemia with SMA thrombus.  She did require ex lap with all viable bowel but had evidence of hemoperitoneum with no bleeding source identified by general surgery. She had a slow hospital course but ultimately was discharged home on aspirin and Plavix.     I subsequently sent her for CTA abdomen pelvis given her slow clinical progress.  Her CTA 06/23/22 showed recurrent thrombus in the SMA stent with associated thrombus in her visceral aorta as demonstrated on previous imaging.  I started her on Eliquis.  I took her back to the Cath Lab on 07/05/2022 for percutaneous thrombectomy of the SMA stent and extension of the SMA stent into the abdominal aorta.  She was doing well until a month ago when she started having postprandial pain.  I sent her for CTA.  Today states she is not doing well.  Gets abdominal pain after eating.  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  States she gets diarrhea immediately after eating.  Past Medical History:  Diagnosis Date   Anemia    Fibroid    Hyperlipidemia    NSTEMI (non-ST elevated myocardial infarction) (HCC) 06/2017   s/p DES to LCX 07/02/17   Sarcoidosis     Past Surgical History:  Procedure Laterality Date   ABDOMINAL AORTOGRAM N/A 07/05/2022   Procedure: ABDOMINAL AORTOGRAM;  Surgeon: Cephus Shelling, MD;  Location: [facility] INVASIVE CV LAB;  Service: Cardiovascular;  Laterality: N/A;   ABDOMINAL HYSTERECTOMY     BREAST BIOPSY Right 03/24/2018   BREAST REDUCTION SURGERY Bilateral 10/17/2020   Procedure:  MAMMARY REDUCTION  (BREAST);  Surgeon: Allena Napoleon, MD;  Location: [facility] OR;  Service: Plastics;  Laterality: Bilateral;  2 hours   CESAREAN SECTION  1987   CORONARY/GRAFT ACUTE MI REVASCULARIZATION N/A 06/30/2017   Procedure: Coronary/Graft Acute MI Revascularization;  Surgeon: Runell Gess, MD;  Location: [facility] INVASIVE CV LAB;  Service: Cardiovascular;  Laterality: N/A;   FOREARM FRACTURE SURGERY Right 1977   FRACTURE SURGERY     LAPAROSCOPY N/A 05/04/2022   Procedure: LAPAROSCOPY DIAGNOSTIC;  Surgeon: Axel Filler, MD;  Location: [facility] OR;  Service: General;  Laterality: N/A;   LAPAROTOMY N/A 05/04/2022   Procedure: EXPLORATORY LAPAROTOMY;  Surgeon: Axel Filler, MD;  Location: [facility] OR;  Service: General;  Laterality: N/A;   LEFT HEART CATH AND CORONARY ANGIOGRAPHY N/A 06/30/2017   Procedure: LEFT HEART CATH AND CORONARY ANGIOGRAPHY;  Surgeon: Runell Gess, MD;  Location: [facility] INVASIVE CV LAB;  Service: Cardiovascular;  Laterality: N/A;   LEFT HEART CATH AND CORONARY ANGIOGRAPHY N/A 12/23/2017   Procedure: LEFT HEART CATH AND CORONARY ANGIOGRAPHY;  Surgeon: Runell Gess, MD;  Location: [facility] INVASIVE CV LAB;  Service: Cardiovascular;  Laterality: N/A;   PERIPHERAL VASCULAR INTERVENTION  07/05/2022   Procedure: PERIPHERAL VASCULAR INTERVENTION;  Surgeon: Cephus Shelling, MD;  Location: [facility] INVASIVE CV LAB;  Service: Cardiovascular;;  SMA   PERIPHERAL VASCULAR THROMBECTOMY N/A 07/05/2022   Procedure: PERIPHERAL VASCULAR THROMBECTOMY;  Surgeon: Cephus Shelling, MD;  Location: [facility] INVASIVE CV LAB;  Service: Cardiovascular;  Laterality: N/A;  SMA   TUBAL LIGATION  1990    Family History  Problem Relation Age of Onset   Diabetes Mother    Stroke Mother 41   Hypertension Mother    Hyperlipidemia Father    Stroke Father 78   Hypertension Father    Breast cancer Paternal Grandmother     SOCIAL HISTORY: Social History   Socioeconomic History   Marital status: Widowed     Spouse name: Not on file   Number of children: 2   Years of education: Not on file   Highest education level: Not on file  Occupational History   Occupation: Quarry manager: Company secretary  Tobacco Use   Smoking status: Some Days    Packs/day: 1.00    Years: 29.00    Additional pack years: 0.00    Total pack years: 29.00    Types: Cigarettes    Last attempt to quit: 03/19/2018    Years since quitting: 4.7   Smokeless tobacco: Never   Tobacco comments:    11/09/2021 Patient smokes some days  started back in 2023  Vaping Use   Vaping Use: Never used  Substance and Sexual Activity   Alcohol use: Not Currently    Comment: /no   Drug use: Never   Sexual activity: Not on file  Other Topics Concern   Not on file  Social History Narrative   Are you right handed or left handed? right   Are you currently employed ?    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 befriend   Who lives with you?    What type of home do you live in: 1 story or 2 story? two       Social Determinants of Health   Financial Resource Strain: Not on file  Food Insecurity: No Food Insecurity (05/04/2022)   Hunger Vital Sign    Worried About Running Out of Food in the Last Year: Never true    Ran Out of Food in the Last Year: Never true  Transportation Needs: No Transportation Needs (05/04/2022)   PRAPARE - Administrator, Civil Service (Medical): No    Lack of Transportation (Non-Medical): No  Physical Activity: Not on file  Stress: Not on file  Social Connections: Not on file  Intimate Partner Violence: Not At Risk (05/04/2022)   Humiliation, Afraid, Rape, and Kick questionnaire    Fear of Current or Ex-Partner: No    Emotionally Abused: No    Physically Abused: No    Sexually Abused: No    Allergies  Allergen Reactions   Penicillins Itching and Rash    Caused rash and blisters   Hydrocodone Hives and  Itching    Current Outpatient Medications  Medication Sig Dispense Refill   acetaminophen (TYLENOL) 500 MG tablet Take 1,000 mg by mouth every 6 (six) hours as needed for moderate pain.     apixaban (ELIQUIS) 5 MG TABS tablet Take 1 tablet (5 mg total) by mouth 2 (two) times daily. 60 tablet 5   aspirin EC 81 MG tablet Take 1 tablet (81 mg total) by mouth daily. Swallow whole. 30 tablet    atorvastatin (LIPITOR) 40 MG tablet TAKE 1 TABLET(40 MG) BY MOUTH DAILY 30 tablet 0   clopidogrel (PLAVIX) 75 MG tablet Take 1 tablet (75 mg total) by mouth daily with breakfast. 30 tablet 11  famotidine (PEPCID) 20 MG tablet Take 20 mg by mouth daily as needed for heartburn or indigestion.     gabapentin (NEURONTIN) 300 MG capsule Take 1 capsule (300 mg) in the morning and take 2 capsules (600 mg) at bedtime 180 capsule 3   No current facility-administered medications for this visit.    REVIEW OF SYSTEMS:  [X]  denotes positive finding, [ ]  denotes negative finding Cardiac  Comments:  Chest pain or chest pressure:    Shortness of breath upon exertion:    Short of breath when lying flat:    Irregular heart rhythm:        Vascular    Pain in calf, thigh, or hip brought on by ambulation:    Pain in feet at night that wakes you up from your sleep:     Blood clot in your veins:    Leg swelling:         Pulmonary    Oxygen at home:    Productive cough:     Wheezing:         Neurologic    Sudden weakness in arms or legs:     Sudden numbness in arms or legs:     Sudden onset of difficulty speaking or slurred speech:    Temporary loss of vision in one eye:     Problems with dizziness:         Gastrointestinal    Blood in stool:     Vomited blood:     Abdominal pain x   Genitourinary    Burning when urinating:     Blood in urine:        Psychiatric    Major depression:         Hematologic    Bleeding problems:    Problems with blood clotting too easily:        Skin    Rashes or  ulcers:        Constitutional    Fever or chills:      PHYSICAL EXAM: There were no vitals filed for this visit.   GENERAL: The patient is a well-nourished female, in no acute distress. The vital signs are documented above. CARDIAC: There is a regular rate and rhythm.  VASCULAR: Palpable femoral pulses bilatearlly PULMONARY: No respiratory distress. ABDOMEN: Midline incision healed.  No rebound or guarding.  No peritonitis.  No significant pain on my exam.. MUSCULOSKELETAL: There are no major deformities or cyanosis. NEUROLOGIC: No focal weakness or paresthesias are detected. PSYCHIATRIC: The patient has a normal affect.  DATA:   CTA abdomen pelvis 12/04/2022 with evidence of thrombosis of previously placed SMA stent x 2.  There is reconstitution of the SMA through large collateral from the GDA off the celiac.  Celiac artery has moderate stenosis.  Assessment/Plan:  57 y.o. female that presents for follow-up after CTA abdomen pelvis given recurrent postprandial pain in the setting of multiple previous SMA interventions.  She initially had mechanical thrombectomy of the SMA with angioplasty and stenting on 05/03/2022 for acute mesenteric ischemia with SMA thrombus.  She did require laparotomy by general surgery on 05/04/2022 with findings of hemoperitoneum with no bleeding source found.   She had a slow clinical course and we got a repeat CTA in January 2024 that showed thrombus in her proximal SMA stent likely extension from chronic mural thrombus in the descending thoracic and abdominal visceral aorta.  I took her back to the Cath Lab on 07/05/2022 with extension of the SMA  stent into the abdominal aorta with an additional VBX.    She was doing well until a month ago when she had recurrent symptoms.  I sent her for additional CTA.  Now her SMA stents are thrombosed for a second time.  This is even on triple therapy with aspirin, Eliquis, Plavix.  Stents look to be adequately positioned.   Discussed I do not think a third percutaneous intervention would be worthwhile.  I have recommended open mesenteric bypass likely from the supraceliac aorta to the celiac and SMA.  Some chance we decide to come retrograde off the iliac although I think this is probably the second option.  I discussed this is major abdominal surgery that will require extensive recovery of likely 2 to 3 months.  I discussed major risk of bleeding, bowel ischemia, bowel injury, MI, pulmonary complications, renal insufficiency and/or failure, even 5% risk of death.  This will be more challenging with her morbid obesity.  She does not feel she can tolerate her symptoms which we discussed as an alternative.  I have scheduled her for 01/04/2023 and will have my partner Dr. Randie Heinz co-surgeon given the complexity of this intervention.     Cephus Shelling, MD [facility] Office: [phone]

## 2022-12-25 ENCOUNTER — Ambulatory Visit: Payer: Commercial Managed Care - PPO | Admitting: Vascular Surgery

## 2022-12-26 ENCOUNTER — Telehealth: Payer: Self-pay

## 2022-12-26 NOTE — Telephone Encounter (Signed)
-----   Message from [facility] sent at 12/26/2022  8:44 AM EDT ----- Regarding: FW: Lab results I saw this in the [facility] if you have time do you mind calling this patient for Dr. Loleta Chance please ----- Message ----- From: Antony Madura, MD Sent: 12/26/2022   7:48 AM EDT To: [id number] Clinical Pool Subject: Lab results                                    Can we let patient know the results of her labs? I tried to send the following MyChart message to her, but she never read it: "Your labs showed a couple of abnormalities. First, your sugar number (HbA1c) was elevated to 6.4. 6.5 is considered diabetes, so you are close. I would discuss this further with your primary care doctor.   Second, your B1 (or thiamine) level was still very low. I again recommend you take B1 (thiamine) 100 mg daily. Have you been taking this? If so, it is strange that it is still low."  Thank you,  Jacquelyne Balint, MD

## 2022-12-26 NOTE — Telephone Encounter (Signed)
I spoke with patient regarding labs and to follow up with pcp and to start b1 100mg  daily.She thanked me for calling.

## 2022-12-26 NOTE — Telephone Encounter (Signed)
-----   Message from Antony Madura sent at 12/26/2022  7:47 AM EDT ----- Regarding: Lab results Can we let patient know the results of her labs? I tried to send the following MyChart message to her, but she never read it: "Your labs showed a couple of abnormalities. First, your sugar number (HbA1c) was elevated to 6.4. 6.5 is considered diabetes, so you are close. I would discuss this further with your primary care doctor.   Second, your B1 (or thiamine) level was still very low. I again recommend you take B1 (thiamine) 100 mg daily. Have you been taking this? If so, it is strange that it is still low."  Thank you,  Jacquelyne Balint, MD

## 2022-12-26 NOTE — Telephone Encounter (Signed)
I spoke with patient and advised of labs, she will follow up with PCP and start b1 100mg  daily

## 2022-12-27 ENCOUNTER — Encounter (HOSPITAL_COMMUNITY): Payer: Self-pay

## 2022-12-27 NOTE — Progress Notes (Signed)
Surgical Instructions    Your procedure is scheduled on Friday, 01/04/23.  Report to [facility] Main Entrance "A" at 5:30 A.M., then check in with the Admitting office.  Call this number if you have problems the morning of surgery:  [phone]   If you have any questions prior to your surgery date call [phone]: Open Monday-Friday 8am-4pm If you experience any cold or flu symptoms such as cough, fever, chills, shortness of breath, etc. between now and your scheduled surgery, please notify us at the above number     Remember:  Do not eat or drink after midnight Thursday night.       Take these medicines the morning of surgery with A SIP OF WATER:  acetaminophen (TYLENOL) if needed aspirin  atorvastatin (LIPITOR)  gabapentin (NEURONTIN)  metoprolol succinate    As of today, STOP taking any Aspirin (unless otherwise instructed by your surgeon) Aleve, Naproxen, Ibuprofen, Motrin, Advil, Goody's, BC's, all herbal medications, fish oil, and all vitamins.           Do not wear jewelry or makeup. Do not wear lotions, powders, perfumes or deodorant. Do not shave 48 hours prior to surgery.   Do not bring valuables to the hospital. Do not wear nail polish, gel polish, artificial nails, or any other type of covering on natural nails (fingers and toes) If you have artificial nails or gel coating that need to be removed by a nail salon, please have this removed prior to surgery. Artificial nails or gel coating may interfere with anesthesia's ability to adequately monitor your vital signs.  [facility] is not responsible for any belongings or valuables.    Do NOT Smoke (Tobacco/Vaping)  24 hours prior to your procedure   Contacts, glasses, hearing aids, dentures or partials may not be worn into surgery, please bring cases for these belongings   For patients admitted to the hospital, discharge time will be determined by your treatment team.    SURGICAL WAITING ROOM  VISITATION Patients having surgery or a procedure may have no more than 2 support people in the waiting area - these visitors may rotate.   Children under the age of 29 must have an adult with them who is not the patient. If the patient needs to stay at the hospital during part of their recovery, the visitor guidelines for inpatient rooms apply. Pre-op nurse will coordinate an appropriate time for 1 support person to accompany patient in pre-op.  This support person may not rotate.   Please refer to [email or web address] for the visitor guidelines for Inpatients (after your surgery is over and you are in a regular room).    Special instructions:    Oral Hygiene is also important to reduce your risk of infection.  Remember - BRUSH YOUR TEETH THE MORNING OF SURGERY WITH YOUR REGULAR TOOTHPASTE   [place]- Preparing For Surgery  Before surgery, you can play an important role. Because skin is not sterile, your skin needs to be as free of germs as possible. You can reduce the number of germs on your skin by washing with CHG (chlorahexidine gluconate) Soap before surgery.  CHG is an antiseptic cleaner which kills germs and bonds with the skin to continue killing germs even after washing.     Please do not use if you have an allergy to CHG or antibacterial soaps. If your skin becomes reddened/irritated stop using the CHG.  Do not shave (including legs and underarms) for at least 48 hours prior to  first CHG shower. It is OK to shave your face.  Please follow these instructions carefully.     Shower the NIGHT BEFORE SURGERY-Thurs and the MORNING OF SURGERY-Fri with CHG Soap.   If you chose to wash your hair, wash your hair first as usual with your normal shampoo. After you shampoo, rinse your hair and body thoroughly to remove the shampoo.  Then [employer] and genitals (private parts) with your normal soap and rinse thoroughly to remove  soap.  After that Use CHG Soap as you would any other liquid soap. You can apply CHG directly to the skin and wash gently with a scrungie or a clean washcloth.   Apply the CHG Soap to your body ONLY FROM THE NECK DOWN.  Do not use on open wounds or open sores. Avoid contact with your eyes, ears, mouth and genitals (private parts). Wash Face and genitals (private parts)  with your normal soap.   Wash thoroughly, paying special attention to the area where your surgery will be performed.  Thoroughly rinse your body with warm water from the neck down.  DO NOT shower/wash with your normal soap after using and rinsing off the CHG Soap.  Pat yourself dry with a CLEAN TOWEL.  Wear CLEAN PAJAMAS to bed the night before surgery  Place CLEAN SHEETS on your bed the night before your surgery  DO NOT SLEEP WITH PETS.   Day of Surgery:  Take a shower with CHG soap. Wear Clean/Comfortable clothing the morning of surgery Do not apply any deodorants/lotions.   Remember to brush your teeth WITH YOUR REGULAR TOOTHPASTE.    If you received a COVID test during your pre-op visit, it is requested that you wear a mask when out in public, stay away from anyone that may not be feeling well, and notify your surgeon if you develop symptoms. If you have been in contact with anyone that has tested positive in the last 10 days, please notify your surgeon.    Please read over the following fact sheets that you were given.

## 2022-12-28 ENCOUNTER — Encounter (HOSPITAL_COMMUNITY)
Admission: RE | Admit: 2022-12-28 | Discharge: 2022-12-28 | Disposition: A | Discharge status: Home / Self Care | Payer: Commercial Managed Care - PPO | Source: Ambulatory Visit | Attending: Vascular Surgery | Admitting: Vascular Surgery

## 2022-12-28 ENCOUNTER — Other Ambulatory Visit: Payer: Self-pay

## 2022-12-28 ENCOUNTER — Encounter (HOSPITAL_COMMUNITY): Payer: Self-pay

## 2022-12-28 VITALS — BP 147/61 | HR 80 | Temp 97.9°F | Resp 16 | Ht 64.5 in | Wt 204.5 lb

## 2022-12-28 DIAGNOSIS — I252 Old myocardial infarction: Secondary | ICD-10-CM | POA: Diagnosis not present

## 2022-12-28 DIAGNOSIS — E785 Hyperlipidemia, unspecified: Secondary | ICD-10-CM | POA: Insufficient documentation

## 2022-12-28 DIAGNOSIS — Z01818 Encounter for other preprocedural examination: Secondary | ICD-10-CM

## 2022-12-28 DIAGNOSIS — K551 Chronic vascular disorders of intestine: Secondary | ICD-10-CM | POA: Diagnosis not present

## 2022-12-28 DIAGNOSIS — I700 Atherosclerosis of aorta: Secondary | ICD-10-CM | POA: Diagnosis not present

## 2022-12-28 DIAGNOSIS — Z955 Presence of coronary angioplasty implant and graft: Secondary | ICD-10-CM | POA: Insufficient documentation

## 2022-12-28 DIAGNOSIS — I2510 Atherosclerotic heart disease of native coronary artery without angina pectoris: Secondary | ICD-10-CM | POA: Diagnosis not present

## 2022-12-28 DIAGNOSIS — Z01812 Encounter for preprocedural laboratory examination: Secondary | ICD-10-CM | POA: Diagnosis not present

## 2022-12-28 DIAGNOSIS — K449 Diaphragmatic hernia without obstruction or gangrene: Secondary | ICD-10-CM | POA: Diagnosis not present

## 2022-12-28 DIAGNOSIS — K5730 Diverticulosis of large intestine without perforation or abscess without bleeding: Secondary | ICD-10-CM | POA: Diagnosis not present

## 2022-12-28 DIAGNOSIS — K55069 Acute infarction of intestine, part and extent unspecified: Secondary | ICD-10-CM

## 2022-12-28 DIAGNOSIS — D869 Sarcoidosis, unspecified: Secondary | ICD-10-CM | POA: Insufficient documentation

## 2022-12-28 DIAGNOSIS — M4802 Spinal stenosis, cervical region: Secondary | ICD-10-CM | POA: Insufficient documentation

## 2022-12-28 DIAGNOSIS — I10 Essential (primary) hypertension: Secondary | ICD-10-CM | POA: Insufficient documentation

## 2022-12-28 HISTORY — DX: Essential (primary) hypertension: I10

## 2022-12-28 HISTORY — DX: Myoneural disorder, unspecified: G70.9

## 2022-12-28 HISTORY — DX: Chronic vascular disorders of intestine: K55.1

## 2022-12-28 LAB — TYPE AND SCREEN
ABO/RH(D): B POS
Antibody Screen: NEGATIVE

## 2022-12-28 LAB — CBC
HCT: 38.2 % (ref 36.0–46.0)
Hemoglobin: 12.5 g/dL (ref 12.0–15.0)
MCH: 28.1 pg (ref 26.0–34.0)
MCHC: 32.7 g/dL (ref 30.0–36.0)
MCV: 85.8 fL (ref 80.0–100.0)
Platelets: 299 10*3/uL (ref 150–400)
RBC: 4.45 MIL/uL (ref 3.87–5.11)
RDW: 12.9 % (ref 11.5–15.5)
WBC: 6.3 10*3/uL (ref 4.0–10.5)
nRBC: 0 % (ref 0.0–0.2)

## 2022-12-28 LAB — URINALYSIS, ROUTINE W REFLEX MICROSCOPIC
Bilirubin Urine: NEGATIVE
Glucose, UA: NEGATIVE mg/dL
Hgb urine dipstick: NEGATIVE
Ketones, ur: NEGATIVE mg/dL
Leukocytes,Ua: NEGATIVE
Nitrite: NEGATIVE
Protein, ur: NEGATIVE mg/dL
Specific Gravity, Urine: 1.017 (ref 1.005–1.030)
pH: 5 (ref 5.0–8.0)

## 2022-12-28 LAB — PROTIME-INR
INR: 1.1 (ref 0.8–1.2)
Prothrombin Time: 14.1 seconds (ref 11.4–15.2)

## 2022-12-28 LAB — COMPREHENSIVE METABOLIC PANEL
ALT: 24 U/L (ref 0–44)
AST: 25 U/L (ref 15–41)
Albumin: 3.6 g/dL (ref 3.5–5.0)
Alkaline Phosphatase: 131 U/L — ABNORMAL HIGH (ref 38–126)
Anion gap: 8 (ref 5–15)
BUN: 7 mg/dL (ref 6–20)
CO2: 24 mmol/L (ref 22–32)
Calcium: 9.8 mg/dL (ref 8.9–10.3)
Chloride: 108 mmol/L (ref 98–111)
Creatinine, Ser: 0.82 mg/dL (ref 0.44–1.00)
GFR, Estimated: >60 mL/min (ref >60)
Glucose, Bld: 99 mg/dL (ref 70–99)
Potassium: 3.6 mmol/L (ref 3.5–5.1)
Sodium: 140 mmol/L (ref 135–145)
Total Bilirubin: 1 mg/dL (ref 0.3–1.2)
Total Protein: 6.5 g/dL (ref 6.5–8.1)

## 2022-12-28 LAB — SURGICAL PCR SCREEN
MRSA, PCR: NEGATIVE
Staphylococcus aureus: NEGATIVE

## 2022-12-28 LAB — APTT: aPTT: 24 seconds (ref 24–36)

## 2022-12-28 NOTE — Progress Notes (Signed)
Surgical Instructions    Your procedure is scheduled on Friday, 01/04/23.  Report to [facility] Main Entrance "A" at 5:30 A.M., then check in with the Admitting office.  Call this number if you have problems the morning of surgery:  [phone]   If you have any questions prior to your surgery date call [phone]: Open Monday-Friday 8am-4pm If you experience any cold or flu symptoms such as cough, fever, chills, shortness of breath, etc. between now and your scheduled surgery, please notify us at the above number     Remember:  Do not eat or drink after midnight Thursday night.       Take these medicines the morning of surgery with A SIP OF WATER:  acetaminophen (TYLENOL) if needed aspirin  atorvastatin (LIPITOR)  gabapentin (NEURONTIN)  metoprolol succinate   Stop taking Plavix 5 days prior to surgery with last day 12-29-2022  Stop taking Eliquis 3 days prior to surgery with last day 12-31-2022  As of today, STOP taking any Aleve, Naproxen, Ibuprofen, Motrin, Advil, Goody's, BC's, all herbal medications, fish oil, and all vitamins.           Do not wear jewelry or makeup. Do not wear lotions, powders, perfumes or deodorant. Do not shave 48 hours prior to surgery.   Do not bring valuables to the hospital. Do not wear nail polish, gel polish, artificial nails, or any other type of covering on natural nails (fingers and toes) If you have artificial nails or gel coating that need to be removed by a nail salon, please have this removed prior to surgery. Artificial nails or gel coating may interfere with anesthesia's ability to adequately monitor your vital signs.  [facility] is not responsible for any belongings or valuables.    Do NOT Smoke (Tobacco/Vaping)  24 hours prior to your procedure   Contacts, glasses, hearing aids, dentures or partials may not be worn into surgery, please bring cases for these belongings   For patients admitted to the hospital, discharge time will  be determined by your treatment team.    SURGICAL WAITING ROOM VISITATION Patients having surgery or a procedure may have no more than 2 support people in the waiting area - these visitors may rotate.   Children under the age of 58 must have an adult with them who is not the patient. If the patient needs to stay at the hospital during part of their recovery, the visitor guidelines for inpatient rooms apply. Pre-op nurse will coordinate an appropriate time for 1 support person to accompany patient in pre-op.  This support person may not rotate.   Please refer to [email or web address] for the visitor guidelines for Inpatients (after your surgery is over and you are in a regular room).    Special instructions:    Oral Hygiene is also important to reduce your risk of infection.  Remember - BRUSH YOUR TEETH THE MORNING OF SURGERY WITH YOUR REGULAR TOOTHPASTE   [place]- Preparing For Surgery  Before surgery, you can play an important role. Because skin is not sterile, your skin needs to be as free of germs as possible. You can reduce the number of germs on your skin by washing with CHG (chlorahexidine gluconate) Soap before surgery.  CHG is an antiseptic cleaner which kills germs and bonds with the skin to continue killing germs even after washing.     Please do not use if you have an allergy to CHG or antibacterial soaps. If your skin becomes reddened/irritated stop  using the CHG.  Do not shave (including legs and underarms) for at least 48 hours prior to first CHG shower. It is OK to shave your face.  Please follow these instructions carefully.     Shower the NIGHT BEFORE SURGERY-Thurs and the MORNING OF SURGERY-Fri with CHG Soap.   If you chose to wash your hair, wash your hair first as usual with your normal shampoo. After you shampoo, rinse your hair and body thoroughly to remove the shampoo.  Then [employer] and genitals (private parts)  with your normal soap and rinse thoroughly to remove soap.  After that Use CHG Soap as you would any other liquid soap. You can apply CHG directly to the skin and wash gently with a scrungie or a clean washcloth.   Apply the CHG Soap to your body ONLY FROM THE NECK DOWN.  Do not use on open wounds or open sores. Avoid contact with your eyes, ears, mouth and genitals (private parts). Wash Face and genitals (private parts)  with your normal soap.   Wash thoroughly, paying special attention to the area where your surgery will be performed.  Thoroughly rinse your body with warm water from the neck down.  DO NOT shower/wash with your normal soap after using and rinsing off the CHG Soap.  Pat yourself dry with a CLEAN TOWEL.  Wear CLEAN PAJAMAS to bed the night before surgery  Place CLEAN SHEETS on your bed the night before your surgery  DO NOT SLEEP WITH PETS.   Day of Surgery:  Take a shower with CHG soap. Wear Clean/Comfortable clothing the morning of surgery Do not apply any deodorants/lotions.   Remember to brush your teeth WITH YOUR REGULAR TOOTHPASTE.    If you received a COVID test during your pre-op visit, it is requested that you wear a mask when out in public, stay away from anyone that may not be feeling well, and notify your surgeon if you develop symptoms. If you have been in contact with anyone that has tested positive in the last 10 days, please notify your surgeon.    Please read over the following fact sheets that you were given.

## 2022-12-31 ENCOUNTER — Encounter (HOSPITAL_COMMUNITY): Payer: Self-pay

## 2022-12-31 NOTE — Progress Notes (Signed)
Anesthesia Chart Review:  Case: [id number] Date/Time: 01/04/23 0715   Procedure: OPEN MESENTERIC BYPASS INCLUDING AORTA CELIAC, AORTA SUPERIOR MESENTERIC ARTERY   Anesthesia type: General   Pre-op diagnosis: Chronic mesenteric ischemia with occluded superior mesenteric artery stent   [facility]: [facility] ROOM 11 / [facility]   Surgeons: Cephus Shelling, MD       DISCUSSION: Patient is a 57 year old female scheduled for the above procedure.  History includes former smoker (quit 03/19/18), Sarcoidosis,HLD, HTN, CAD (NSTEMI, s/p DES LCX 07/01/17), mesenteric ischemia (05/03/22 presented with abd pain, CTA showed thrombus in abd aorta extending into SMA; s/p percutaneous mechanical thrombectomy SMA, angioplasty/stenting SMA 05/03/22 complicated by hemoperitoneum although no source of bleeding found during exploratory laparotomy 05/04/22;  s/p percutaneous mechanical thrombectomy SMA, angioplasty/stenting SMA with extension of stent into the abd aorta 07/05/22 for symptomatic high grade and thrombus SMA stent; SMA stent thrombus 12/04/22 CTA), anemia, neuropathy, leiomyomata (s/p TAH 12/16/03). BMI is consistent with obesity.   Her last evaluation with cardiologist Dr. Nanetta Batty was on 07/10/22. She had DES proximal AV groove CX on 06/30/17 following NSTEMI. She had recurrent chest pain and underwent LHC 12/23/17 showing patent stent, 25-30% proximal LAD, widely patent RCA, and chest pain was thought to be non-cardiac. Notes indicates she still gets occasional atypical chest pains. Echo on 11/17/21 showed LVEF 60-65%, no regional wall motion abnormalities, mild LVH, grade 1 diastolic dysfunction, mild MR. She had been smoking about 4 cigarettes/day, but had quit about a year prior. He noted SMA interventions and on Eliquis at the time. No cardiac testing ordered, only labs. Six month follow-up planned.  She had vascular surgery follow-up with Dr. Chestine Spore on 12/18/22. Unfortunately, she developed postprandial pain about a  month prior. She was also having diarrhea after eating. CTA ordered and done on 12/04/22 showing sequela of previous SMA stenting, now complete thrombosis of the stent with early reconstitution of the SMA via hypertrophied arterial collaterals from the celiac, no CT evidence of bowel ischemia., redemonstration of fibrofatty atherosclerotic plaque with associated thrombus involving the distal aspect of the descending thoracic and proximal aspect of the abdominal aorta. SMA thrombosis was despite being on ASA, Eliquis, and Plavix. Stents appeared adequately positioned, so he did not feel that a 3rd attempt at percutaneous intervention would be worthwhile. She did not she could tolerate her symptoms, so she opted to proceed with above surgery. Per note, Dr. Randie Heinz to co-surgeon.   Per [facility], hold Plavix for 5 days prior to procedure and Eliquis for 3 days prior to procedure. Continue ASA. She denied SOB, chest pain, cough, fever per PAT RN interview.   Anesthesia team to evaluate on the day of surgery.    VS: BP (!) 147/61   Pulse 80   Temp 36.6 C (Oral)   Resp 16   Ht 5' 4.5" (1.638 m)   Wt 92.8 kg   SpO2 100%   BMI 34.56 kg/m    PROVIDERS: Caffie Damme, MD is PCP  Nanetta Batty, MD is cardiologist Jacquelyne Balint, MD is neurologist (for neuropathy)   LABS: Labs reviewed: Acceptable for surgery. A1c 6.4% on 12/14/22.  (all labs ordered are listed, but only abnormal results are displayed)  Labs Reviewed  COMPREHENSIVE METABOLIC PANEL - Abnormal; Notable for the following components:      Result Value   Alkaline Phosphatase 131 (*)    All other components within normal limits  SURGICAL PCR SCREEN  CBC  PROTIME-INR  APTT  URINALYSIS,  ROUTINE W REFLEX MICROSCOPIC  TYPE AND SCREEN     IMAGES: CTA Abd/Pelvis 12/04/22: IMPRESSION: VASCULAR IMPRESSION 1. Sequela of previous SMA stenting, now complete thrombosis of the stent, progressed compared to the 06/2022 examination. There is early  reconstitution of the SMA via hypertrophied arterial collaterals from the celiac. No discrete intraluminal filling defect to suggest distal embolism. No CT evidence of bowel ischemia. 2. Redemonstrated fibrofatty atherosclerotic plaque with associated thrombus involving the distal aspect of the descending thoracic and proximal aspect of the abdominal aorta. 3. Aortic Atherosclerosis (ICD10-I70.0).   NON-VASCULAR IMPRESSION 1. Scattered colonic diverticulosis without evidence of superimposed acute diverticulitis. 2. Moderate-to-large colonic stool burden without evidence of enteric obstruction. 3. Punctate (approximate 0.4 cm) nonobstructing right-sided renal stone, unchanged. 4. Small hiatal hernia.   MRI Brain/C-spine 09/05/22: MPRESSION: MRI head: Normal brain MRI for patient age.  No acute abnormality.   MRI cervical spine: 1. At C5-C6, moderate to severe left foraminal stenosis. 2. At C4-C5, moderate bilateral foraminal stenosis and mild canal stenosis. 3. At C6-C7, mild left foraminal stenosis.     EKG: 09/05/22: Sinus rhythm Consider right atrial enlargement Nonspecific T abnormalities, inferior leads No significant change since last tracing Confirmed by Gwyneth Sprout ([id number]) on 09/05/2022 3:03:47 PM   CV: Echo 11/17/21: IMPRESSIONS   1. Left ventricular ejection fraction, by estimation, is 60 to 65%. Left  ventricular ejection fraction by 3D volume is 63 %. The left ventricle has  normal function. The left ventricle has no regional wall motion  abnormalities. There is mild left  ventricular hypertrophy. Left ventricular diastolic parameters are  consistent with Grade I diastolic dysfunction (impaired relaxation).   2. Right ventricular systolic function is normal. The right ventricular  size is normal.   3. The mitral valve is normal in structure. Mild mitral valve  regurgitation. No evidence of mitral stenosis.   4. The aortic valve is normal in structure.  Aortic valve regurgitation is  not visualized. No aortic stenosis is present.   5. The inferior vena cava is normal in size with greater than 50%  respiratory variability, suggesting right atrial pressure of 3 mmHg.  - Comparison(s): EF 55%, mild LVH.    Cardiac cath 12/23/17: Previously placed Prox Cx to Mid Cx stent (unknown type) is widely patent. Prox LAD lesion is 25% stenosed. There is mild left ventricular systolic dysfunction. The left ventricular ejection fraction is 50-55% by visual estimate. IMPRESSION:  Ms. Sweigert has a patent mid AV groove circumflex stent.  Her distal circumflex is widely patent.  Her proximal LAD is at most 25 to 30% stenosed, less severe than on her initial cath 6 months ago and her RCA is widely patent.  Her LV function is preserved although she does have an apical wall motion abnormality which is unchanged from her initial cath.  She has no culprit lesions.  I believe her chest pain is noncardiac.    Cardiac event monitor 09/03/17: NSR   Past Medical History:  Diagnosis Date   Anemia    Fibroid    Hyperlipidemia    Hypertension    Neuromuscular disorder (HCC)    Neuropathy   NSTEMI (non-ST elevated myocardial infarction) (HCC) 06/2017   s/p DES to LCX 07/02/17   Sarcoidosis     Past Surgical History:  Procedure Laterality Date   ABDOMINAL AORTOGRAM N/A 07/05/2022   Procedure: ABDOMINAL AORTOGRAM;  Surgeon: Cephus Shelling, MD;  Location: [facility] INVASIVE CV LAB;  Service: Cardiovascular;  Laterality: N/A;   ABDOMINAL  HYSTERECTOMY     BREAST BIOPSY Right 03/24/2018   BREAST REDUCTION SURGERY Bilateral 10/17/2020   Procedure: MAMMARY REDUCTION  (BREAST);  Surgeon: Allena Napoleon, MD;  Location: [facility] OR;  Service: Plastics;  Laterality: Bilateral;  2 hours   CESAREAN SECTION  1987   CORONARY/GRAFT ACUTE MI REVASCULARIZATION N/A 06/30/2017   Procedure: Coronary/Graft Acute MI Revascularization;  Surgeon: Runell Gess, MD;  [facility] INVASIVE  CV LAB;  Service: Cardiovascular;  Laterality: N/A;   FOREARM FRACTURE SURGERY Right 1977   FRACTURE SURGERY     LAPAROSCOPY N/A 05/04/2022   Procedure: LAPAROSCOPY DIAGNOSTIC;  Surgeon: Axel Filler, MD;  Location: [facility] OR;  Service: General;  Laterality: N/A;   LAPAROTOMY N/A 05/04/2022   Procedure: EXPLORATORY LAPAROTOMY;  Surgeon: Axel Filler, MD;  Location: [facility] OR;  Service: General;  Laterality: N/A;   LEFT HEART CATH AND CORONARY ANGIOGRAPHY N/A 06/30/2017   Procedure: LEFT HEART CATH AND CORONARY ANGIOGRAPHY;  Surgeon: Runell Gess, MD;  [facility] INVAS[facility] CV LAB;  Service: Cardiovascular;  Laterality: N/A;   LEFT HEART CATH AND CORONARY ANGIOGRAPHY N/A 12/23/2017   Procedure: LEFT HEART CATH AND CORONARY ANGIOGRAPHY;  Surgeon: Runell Gess, MD;  [facility] INVASIVE CV LAB;  Service: Cardiovascular;  Laterality: N/A;   PERIPHERAL VASCULAR INTERVENTION  07/05/2022   Procedure: PERIPHERAL VASCULAR INTERVENTION;  Surgeon: Cephus Shelling, MD;  [facility]: [facility] INVASIVE CV LAB;  Service: Cardiovascular;;  SMA   PERIPHERAL VASCULAR THROMBECTOMY N/A 07/05/2022   Procedure: PERIPHERAL VASCULAR THROMBECTOMY;  Surgeon: Cephus Shelling, MD;  [facility] INVASIVE CV LAB;  Service: Cardiovascular;  Laterality: N/A;  SMA   TUBAL LIGATION  1990    MEDICATIONS:  acetaminophen (TYLENOL) 500 MG tablet   apixaban (ELIQUIS) 5 MG TABS tablet   aspirin EC 81 MG tablet   atorvastatin (LIPITOR) 40 MG tablet   clopidogrel (PLAVIX) 75 MG tablet   gabapentin (NEURONTIN) 300 MG capsule   metoprolol succinate (TOPROL-XL) 25 MG 24 hr tablet   thiamine (VITAMIN B-1) 100 MG tablet   No current facility-administered medications for this encounter.    Shonna Chock, PA-C Surgical Short Stay/Anesthesiology [facility] Phone [phone] [facility] Phone [phone] 12/31/2022 3:47 PM

## 2022-12-31 NOTE — Anesthesia Preprocedure Evaluation (Addendum)
Anesthesia Evaluation  Patient identified by MRN, date of birth, ID band Patient awake    Reviewed: Allergy & Precautions, H&P , NPO status , Patient's Chart, lab work & pertinent test results  Airway Mallampati: II   Neck ROM: full    Dental   Pulmonary former smoker   breath sounds clear to auscultation       Cardiovascular hypertension, + CAD, + Past MI, + Cardiac Stents and + Peripheral Vascular Disease   Rhythm:regular Rate:Normal  TTE (11/17/21): normal LVEF   Neuro/Psych  Neuromuscular disease    GI/Hepatic   Endo/Other    Renal/GU      Musculoskeletal   Abdominal   Peds  Hematology   Anesthesia Other Findings   Reproductive/Obstetrics                             Anesthesia Physical Anesthesia Plan  ASA: 3  Anesthesia Plan: General   Post-op Pain Management:    Induction: Intravenous  PONV Risk Score and Plan: 3 and Ondansetron, Dexamethasone, Midazolam and Treatment may vary due to age or medical condition  Airway Management Planned: Oral ETT  Additional Equipment: Arterial line  Intra-op Plan:   Post-operative Plan: Extubation in OR  Informed Consent: I have reviewed the patients History and Physical, chart, labs and discussed the procedure including the risks, benefits and alternatives for the proposed anesthesia with the patient or authorized representative who has indicated his/her understanding and acceptance.     Dental advisory given  Plan Discussed with: CRNA, Anesthesiologist and Surgeon  Anesthesia Plan Comments: (PAT note written 12/31/2022 by Shonna Chock, PA-C.  )       Anesthesia Quick Evaluation

## 2023-01-04 ENCOUNTER — Encounter (HOSPITAL_COMMUNITY)
Admission: RE | Disposition: A | Discharge status: Home Health | Payer: Self-pay | Source: Home / Self Care | Attending: Vascular Surgery

## 2023-01-04 ENCOUNTER — Inpatient Hospital Stay (HOSPITAL_COMMUNITY): Payer: Commercial Managed Care - PPO | Admitting: Vascular Surgery

## 2023-01-04 ENCOUNTER — Other Ambulatory Visit: Payer: Self-pay

## 2023-01-04 ENCOUNTER — Inpatient Hospital Stay (HOSPITAL_COMMUNITY): Payer: Commercial Managed Care - PPO | Admitting: Critical Care Medicine

## 2023-01-04 ENCOUNTER — Inpatient Hospital Stay (HOSPITAL_COMMUNITY)
Admission: RE | Admit: 2023-01-04 | Discharge: 2023-01-11 | DRG: 270 | Disposition: A | Discharge status: Home Health | Payer: Commercial Managed Care - PPO | Attending: Vascular Surgery | Admitting: Vascular Surgery

## 2023-01-04 DIAGNOSIS — E785 Hyperlipidemia, unspecified: Secondary | ICD-10-CM | POA: Diagnosis present

## 2023-01-04 DIAGNOSIS — Z6833 Body mass index (BMI) 33.0-33.9, adult: Secondary | ICD-10-CM | POA: Diagnosis not present

## 2023-01-04 DIAGNOSIS — Z823 Family history of stroke: Secondary | ICD-10-CM

## 2023-01-04 DIAGNOSIS — Z8249 Family history of ischemic heart disease and other diseases of the circulatory system: Secondary | ICD-10-CM | POA: Diagnosis not present

## 2023-01-04 DIAGNOSIS — K55069 Acute infarction of intestine, part and extent unspecified: Secondary | ICD-10-CM

## 2023-01-04 DIAGNOSIS — Y828 Other medical devices associated with adverse incidents: Secondary | ICD-10-CM | POA: Diagnosis present

## 2023-01-04 DIAGNOSIS — I252 Old myocardial infarction: Secondary | ICD-10-CM

## 2023-01-04 DIAGNOSIS — R072 Precordial pain: Secondary | ICD-10-CM | POA: Diagnosis not present

## 2023-01-04 DIAGNOSIS — E669 Obesity, unspecified: Secondary | ICD-10-CM | POA: Diagnosis present

## 2023-01-04 DIAGNOSIS — K559 Vascular disorder of intestine, unspecified: Principal | ICD-10-CM | POA: Diagnosis present

## 2023-01-04 DIAGNOSIS — Z83438 Family history of other disorder of lipoprotein metabolism and other lipidemia: Secondary | ICD-10-CM | POA: Diagnosis not present

## 2023-01-04 DIAGNOSIS — K551 Chronic vascular disorders of intestine: Secondary | ICD-10-CM | POA: Diagnosis present

## 2023-01-04 DIAGNOSIS — Z90710 Acquired absence of both cervix and uterus: Secondary | ICD-10-CM

## 2023-01-04 DIAGNOSIS — Z87891 Personal history of nicotine dependence: Secondary | ICD-10-CM

## 2023-01-04 DIAGNOSIS — Z7982 Long term (current) use of aspirin: Secondary | ICD-10-CM | POA: Diagnosis not present

## 2023-01-04 DIAGNOSIS — R079 Chest pain, unspecified: Secondary | ICD-10-CM | POA: Diagnosis not present

## 2023-01-04 DIAGNOSIS — I10 Essential (primary) hypertension: Secondary | ICD-10-CM | POA: Diagnosis present

## 2023-01-04 DIAGNOSIS — I2510 Atherosclerotic heart disease of native coronary artery without angina pectoris: Secondary | ICD-10-CM | POA: Diagnosis present

## 2023-01-04 DIAGNOSIS — Z833 Family history of diabetes mellitus: Secondary | ICD-10-CM

## 2023-01-04 DIAGNOSIS — Z79899 Other long term (current) drug therapy: Secondary | ICD-10-CM | POA: Diagnosis not present

## 2023-01-04 DIAGNOSIS — Z7902 Long term (current) use of antithrombotics/antiplatelets: Secondary | ICD-10-CM | POA: Diagnosis not present

## 2023-01-04 DIAGNOSIS — Z803 Family history of malignant neoplasm of breast: Secondary | ICD-10-CM | POA: Diagnosis not present

## 2023-01-04 DIAGNOSIS — R0682 Tachypnea, not elsewhere classified: Secondary | ICD-10-CM | POA: Diagnosis not present

## 2023-01-04 DIAGNOSIS — Z880 Allergy status to penicillin: Secondary | ICD-10-CM

## 2023-01-04 DIAGNOSIS — T82856A Stenosis of peripheral vascular stent, initial encounter: Secondary | ICD-10-CM | POA: Diagnosis present

## 2023-01-04 DIAGNOSIS — M62838 Other muscle spasm: Secondary | ICD-10-CM | POA: Diagnosis present

## 2023-01-04 DIAGNOSIS — R509 Fever, unspecified: Secondary | ICD-10-CM | POA: Diagnosis not present

## 2023-01-04 DIAGNOSIS — Z885 Allergy status to narcotic agent status: Secondary | ICD-10-CM

## 2023-01-04 DIAGNOSIS — K660 Peritoneal adhesions (postprocedural) (postinfection): Secondary | ICD-10-CM | POA: Diagnosis present

## 2023-01-04 DIAGNOSIS — Z955 Presence of coronary angioplasty implant and graft: Secondary | ICD-10-CM | POA: Diagnosis not present

## 2023-01-04 DIAGNOSIS — D869 Sarcoidosis, unspecified: Secondary | ICD-10-CM | POA: Diagnosis present

## 2023-01-04 HISTORY — PX: ABDOMINAL AORTIC ANEURYSM REPAIR: SHX42

## 2023-01-04 HISTORY — PX: VEIN HARVEST: SHX6363

## 2023-01-04 LAB — POCT I-STAT 7, (LYTES, BLD GAS, ICA,H+H)
Acid-base deficit: 4 mmol/L — ABNORMAL HIGH (ref 0.0–2.0)
Acid-base deficit: 5 mmol/L — ABNORMAL HIGH (ref 0.0–2.0)
Acid-base deficit: 5 mmol/L — ABNORMAL HIGH (ref 0.0–2.0)
Bicarbonate: 21.5 mmol/L (ref 20.0–28.0)
Bicarbonate: 21.3 mmol/L (ref 20.0–28.0)
Bicarbonate: 21.2 mmol/L (ref 20.0–28.0)
Calcium, Ion: 1.29 mmol/L (ref 1.15–1.40)
Calcium, Ion: 1.25 mmol/L (ref 1.15–1.40)
Calcium, Ion: 1.23 mmol/L (ref 1.15–1.40)
HCT: 34 % — ABNORMAL LOW (ref 36.0–46.0)
HCT: 36 % (ref 36.0–46.0)
HCT: 34 % — ABNORMAL LOW (ref 36.0–46.0)
Hemoglobin: 11.6 g/dL — ABNORMAL LOW (ref 12.0–15.0)
Hemoglobin: 12.2 g/dL (ref 12.0–15.0)
Hemoglobin: 11.6 g/dL — ABNORMAL LOW (ref 12.0–15.0)
O2 Saturation: 98 %
O2 Saturation: 99 %
O2 Saturation: 97 %
Patient temperature: 97.8
Potassium: 4.1 mmol/L (ref 3.5–5.1)
Potassium: 3.7 mmol/L (ref 3.5–5.1)
Potassium: 3.9 mmol/L (ref 3.5–5.1)
Sodium: 138 mmol/L (ref 135–145)
Sodium: 139 mmol/L (ref 135–145)
Sodium: 138 mmol/L (ref 135–145)
TCO2: 23 mmol/L (ref 22–32)
TCO2: 23 mmol/L (ref 22–32)
TCO2: 23 mmol/L (ref 22–32)
pCO2 arterial: 38.7 mmHg (ref 32–48)
pCO2 arterial: 43.2 mmHg (ref 32–48)
pCO2 arterial: 41.2 mmHg (ref 32–48)
pH, Arterial: 7.352 (ref 7.35–7.45)
pH, Arterial: 7.302 — ABNORMAL LOW (ref 7.35–7.45)
pH, Arterial: 7.319 — ABNORMAL LOW (ref 7.35–7.45)
pO2, Arterial: 115 mmHg — ABNORMAL HIGH (ref 83–108)
pO2, Arterial: 149 mmHg — ABNORMAL HIGH (ref 83–108)
pO2, Arterial: 100 mmHg (ref 83–108)

## 2023-01-04 LAB — CBC
HCT: 32.5 % — ABNORMAL LOW (ref 36.0–46.0)
Hemoglobin: 10.9 g/dL — ABNORMAL LOW (ref 12.0–15.0)
MCH: 28.4 pg (ref 26.0–34.0)
MCHC: 33.5 g/dL (ref 30.0–36.0)
MCV: 84.6 fL (ref 80.0–100.0)
Platelets: 253 10*3/uL (ref 150–400)
RBC: 3.84 MIL/uL — ABNORMAL LOW (ref 3.87–5.11)
RDW: 12.9 % (ref 11.5–15.5)
WBC: 10.8 10*3/uL — ABNORMAL HIGH (ref 4.0–10.5)
nRBC: 0 % (ref 0.0–0.2)

## 2023-01-04 LAB — BASIC METABOLIC PANEL
Anion gap: 12 (ref 5–15)
BUN: 6 mg/dL (ref 6–20)
CO2: 20 mmol/L — ABNORMAL LOW (ref 22–32)
Calcium: 8.6 mg/dL — ABNORMAL LOW (ref 8.9–10.3)
Chloride: 104 mmol/L (ref 98–111)
Creatinine, Ser: 0.8 mg/dL (ref 0.44–1.00)
GFR, Estimated: >60 mL/min (ref >60)
Glucose, Bld: 176 mg/dL — ABNORMAL HIGH (ref 70–99)
Potassium: 3.6 mmol/L (ref 3.5–5.1)
Sodium: 136 mmol/L (ref 135–145)

## 2023-01-04 LAB — POCT ACTIVATED CLOTTING TIME
Activated Clotting Time: 238 seconds
Activated Clotting Time: 134 seconds
Activated Clotting Time: 238 seconds

## 2023-01-04 LAB — APTT: aPTT: 24 seconds (ref 24–36)

## 2023-01-04 LAB — PROTIME-INR
INR: 1.2 (ref 0.8–1.2)
Prothrombin Time: 15 seconds (ref 11.4–15.2)

## 2023-01-04 LAB — PREPARE RBC (CROSSMATCH)

## 2023-01-04 LAB — MAGNESIUM: Magnesium: 1.4 mg/dL — ABNORMAL LOW (ref 1.7–2.4)

## 2023-01-04 SURGERY — ANEURYSM ABDOMINAL AORTIC REPAIR
Anesthesia: General

## 2023-01-04 MED ORDER — ONDANSETRON HCL 4 MG/2ML IJ SOLN
4.0000 mg | Freq: Four times a day (QID) | INTRAMUSCULAR | Status: DC | PRN
  Administered 2023-01-06: 4 mg via INTRAVENOUS

## 2023-01-04 MED ORDER — SODIUM CHLORIDE 0.9 % IV SOLN: INTRAVENOUS | Status: DC

## 2023-01-04 MED ORDER — SODIUM CHLORIDE 0.9 % IV SOLN: 500.0000 mL | Freq: Once | INTRAVENOUS | Status: DC | PRN

## 2023-01-04 MED ORDER — FENTANYL CITRATE (PF) 100 MCG/2ML IJ SOLN
INTRAMUSCULAR | Status: AC
  Filled 2023-01-04: qty 2

## 2023-01-04 MED ORDER — ACETAMINOPHEN 325 MG RE SUPP: 325.0000 mg | RECTAL | Status: DC | PRN

## 2023-01-04 MED ORDER — PHENYLEPHRINE HCL-NACL 20-0.9 MG/250ML-% IV SOLN
INTRAVENOUS | Status: DC | PRN
  Administered 2023-01-04: 40 ug/min via INTRAVENOUS

## 2023-01-04 MED ORDER — LABETALOL HCL 5 MG/ML IV SOLN
INTRAVENOUS | Status: DC | PRN
  Administered 2023-01-04: 5 mg via INTRAVENOUS
  Administered 2023-01-04: 10 mg via INTRAVENOUS
  Administered 2023-01-04: 5 mg via INTRAVENOUS

## 2023-01-04 MED ORDER — PHENYLEPHRINE 80 MCG/ML (10ML) SYRINGE FOR IV PUSH (FOR BLOOD PRESSURE SUPPORT)
PREFILLED_SYRINGE | INTRAVENOUS | Status: DC | PRN
  Administered 2023-01-04: 80 ug via INTRAVENOUS
  Administered 2023-01-04: 40 ug via INTRAVENOUS

## 2023-01-04 MED ORDER — CHLORHEXIDINE GLUCONATE CLOTH 2 % EX PADS: 6.0000 | MEDICATED_PAD | Freq: Once | CUTANEOUS | Status: DC

## 2023-01-04 MED ORDER — LACTATED RINGERS IV SOLN: INTRAVENOUS | Status: DC

## 2023-01-04 MED ORDER — HEMOSTATIC AGENTS (NO CHARGE) OPTIME
TOPICAL | Status: DC | PRN
  Administered 2023-01-04 (×2): 1 via TOPICAL

## 2023-01-04 MED ORDER — LIDOCAINE 2% (20 MG/ML) 5 ML SYRINGE
INTRAMUSCULAR | Status: DC | PRN
  Administered 2023-01-04: 100 mg via INTRAVENOUS

## 2023-01-04 MED ORDER — PROTAMINE SULFATE 10 MG/ML IV SOLN
INTRAVENOUS | Status: DC | PRN
  Administered 2023-01-04: 5 mg via INTRAVENOUS
  Administered 2023-01-04: 25 mg via INTRAVENOUS
  Administered 2023-01-04 (×2): 10 mg via INTRAVENOUS

## 2023-01-04 MED ORDER — HEPARIN 6000 UNIT IRRIGATION SOLUTION
Status: AC
  Filled 2023-01-04: qty 500

## 2023-01-04 MED ORDER — ORAL CARE MOUTH RINSE: 15.0000 mL | OROMUCOSAL | Status: DC | PRN

## 2023-01-04 MED ORDER — CHLORHEXIDINE GLUCONATE 0.12 % MT SOLN
15.0000 mL | Freq: Once | OROMUCOSAL | Status: AC
  Administered 2023-01-04: 15 mL via OROMUCOSAL
  Filled 2023-01-04: qty 15

## 2023-01-04 MED ORDER — GUAIFENESIN-DM 100-10 MG/5ML PO SYRP: 15.0000 mL | ORAL_SOLUTION | ORAL | Status: DC | PRN

## 2023-01-04 MED ORDER — POTASSIUM CHLORIDE CRYS ER 20 MEQ PO TBCR: 20.0000 meq | EXTENDED_RELEASE_TABLET | Freq: Every day | ORAL | Status: DC | PRN

## 2023-01-04 MED ORDER — ORAL CARE MOUTH RINSE: 15.0000 mL | Freq: Once | OROMUCOSAL | Status: AC

## 2023-01-04 MED ORDER — HEPARIN SODIUM (PORCINE) 5000 UNIT/ML IJ SOLN
5000.0000 [IU] | Freq: Three times a day (TID) | INTRAMUSCULAR | Status: DC
  Administered 2023-01-05 – 2023-01-11 (×18): 5000 [IU] via SUBCUTANEOUS
  Filled 2023-01-04 (×19): qty 1

## 2023-01-04 MED ORDER — FENTANYL CITRATE (PF) 250 MCG/5ML IJ SOLN
INTRAMUSCULAR | Status: DC | PRN
  Administered 2023-01-04: 100 ug via INTRAVENOUS
  Administered 2023-01-04: 50 ug via INTRAVENOUS
  Administered 2023-01-04: 100 ug via INTRAVENOUS

## 2023-01-04 MED ORDER — ACETAMINOPHEN 325 MG PO TABS: 325.0000 mg | ORAL_TABLET | ORAL | Status: DC | PRN

## 2023-01-04 MED ORDER — 0.9 % SODIUM CHLORIDE (POUR BTL) OPTIME
TOPICAL | Status: DC | PRN
  Administered 2023-01-04: 1000 mL

## 2023-01-04 MED ORDER — KETAMINE HCL 10 MG/ML IJ SOLN
INTRAMUSCULAR | Status: DC | PRN
  Administered 2023-01-04: 10 mg via INTRAVENOUS
  Administered 2023-01-04: 30 mg via INTRAVENOUS

## 2023-01-04 MED ORDER — HEPARIN 6000 UNIT IRRIGATION SOLUTION
Status: DC | PRN
  Administered 2023-01-04: 1

## 2023-01-04 MED ORDER — ONDANSETRON HCL 4 MG/2ML IJ SOLN: 4.0000 mg | Freq: Four times a day (QID) | INTRAMUSCULAR | Status: DC | PRN

## 2023-01-04 MED ORDER — FENTANYL CITRATE (PF) 250 MCG/5ML IJ SOLN
INTRAMUSCULAR | Status: AC
  Filled 2023-01-04: qty 5

## 2023-01-04 MED ORDER — VANCOMYCIN HCL IN DEXTROSE 1-5 GM/200ML-% IV SOLN
1000.0000 mg | INTRAVENOUS | Status: AC
  Administered 2023-01-04: 1000 mg via INTRAVENOUS
  Filled 2023-01-04: qty 200

## 2023-01-04 MED ORDER — NALOXONE HCL 0.4 MG/ML IJ SOLN: 0.4000 mg | INTRAMUSCULAR | Status: DC | PRN

## 2023-01-04 MED ORDER — SODIUM CHLORIDE 0.9 % IV SOLN
INTRAVENOUS | Status: DC | PRN
  Administered 2023-01-05: 10 mL via INTRAVENOUS

## 2023-01-04 MED ORDER — LACTATED RINGERS IV SOLN: INTRAVENOUS | Status: DC | PRN

## 2023-01-04 MED ORDER — MIDAZOLAM HCL 2 MG/2ML IJ SOLN
INTRAMUSCULAR | Status: AC
  Filled 2023-01-04: qty 2

## 2023-01-04 MED ORDER — CEFAZOLIN SODIUM-DEXTROSE 2-4 GM/100ML-% IV SOLN
2.0000 g | Freq: Three times a day (TID) | INTRAVENOUS | Status: AC
  Administered 2023-01-04 (×2): 2 g via INTRAVENOUS
  Filled 2023-01-04 (×2): qty 100

## 2023-01-04 MED ORDER — PANTOPRAZOLE SODIUM 40 MG IV SOLR
40.0000 mg | Freq: Every day | INTRAVENOUS | Status: DC
  Administered 2023-01-04 – 2023-01-09 (×6): 40 mg via INTRAVENOUS
  Filled 2023-01-04 (×6): qty 10

## 2023-01-04 MED ORDER — HYDROMORPHONE HCL 1 MG/ML IJ SOLN
INTRAMUSCULAR | Status: DC | PRN
  Administered 2023-01-04: .5 mg via INTRAVENOUS

## 2023-01-04 MED ORDER — SUGAMMADEX SODIUM 200 MG/2ML IV SOLN
INTRAVENOUS | Status: DC | PRN
  Administered 2023-01-04: 400 mg via INTRAVENOUS

## 2023-01-04 MED ORDER — ACETAMINOPHEN 10 MG/ML IV SOLN
INTRAVENOUS | Status: DC | PRN
  Administered 2023-01-04: 1000 mg via INTRAVENOUS

## 2023-01-04 MED ORDER — METOPROLOL TARTRATE 5 MG/5ML IV SOLN: 2.0000 mg | INTRAVENOUS | Status: DC | PRN

## 2023-01-04 MED ORDER — ROCURONIUM BROMIDE 10 MG/ML (PF) SYRINGE
PREFILLED_SYRINGE | INTRAVENOUS | Status: DC | PRN
  Administered 2023-01-04 (×2): 20 mg via INTRAVENOUS
  Administered 2023-01-04: 10 mg via INTRAVENOUS
  Administered 2023-01-04: 80 mg via INTRAVENOUS

## 2023-01-04 MED ORDER — PHENOL 1.4 % MT LIQD: 1.0000 | OROMUCOSAL | Status: DC | PRN

## 2023-01-04 MED ORDER — DIPHENHYDRAMINE HCL 12.5 MG/5ML PO ELIX: 12.5000 mg | ORAL_SOLUTION | Freq: Four times a day (QID) | ORAL | Status: DC | PRN

## 2023-01-04 MED ORDER — OXYCODONE HCL 5 MG PO TABS: 5.0000 mg | ORAL_TABLET | Freq: Once | ORAL | Status: DC | PRN

## 2023-01-04 MED ORDER — DOCUSATE SODIUM 100 MG PO CAPS
100.0000 mg | ORAL_CAPSULE | Freq: Every day | ORAL | Status: DC
  Administered 2023-01-09 – 2023-01-11 (×3): 100 mg via ORAL
  Filled 2023-01-04 (×6): qty 1

## 2023-01-04 MED ORDER — OXYCODONE HCL 5 MG/5ML PO SOLN: 5.0000 mg | Freq: Once | ORAL | Status: DC | PRN

## 2023-01-04 MED ORDER — DEXAMETHASONE SODIUM PHOSPHATE 10 MG/ML IJ SOLN
INTRAMUSCULAR | Status: DC | PRN
  Administered 2023-01-04: 10 mg via INTRAVENOUS

## 2023-01-04 MED ORDER — MORPHINE SULFATE 1 MG/ML IV SOLN PCA
INTRAVENOUS | Status: DC
  Administered 2023-01-04: 1.5 mg via INTRAVENOUS
  Administered 2023-01-05: 4.5 mg via INTRAVENOUS
  Administered 2023-01-05: 2 mg via INTRAVENOUS
  Administered 2023-01-05: 4.5 mg via INTRAVENOUS
  Administered 2023-01-06: 2 mL via INTRAVENOUS
  Administered 2023-01-06 – 2023-01-07 (×6): 1 mL via INTRAVENOUS
  Administered 2023-01-08: 4 mg via INTRAVENOUS
  Administered 2023-01-09 (×2): 1 mg via INTRAVENOUS
  Filled 2023-01-04 (×4): qty 30

## 2023-01-04 MED ORDER — DIPHENHYDRAMINE HCL 50 MG/ML IJ SOLN: 12.5000 mg | Freq: Four times a day (QID) | INTRAMUSCULAR | Status: DC | PRN

## 2023-01-04 MED ORDER — ASPIRIN 81 MG PO TBEC
81.0000 mg | DELAYED_RELEASE_TABLET | Freq: Every day | ORAL | Status: DC
  Administered 2023-01-06 – 2023-01-11 (×4): 81 mg via ORAL
  Filled 2023-01-04 (×6): qty 1

## 2023-01-04 MED ORDER — HYDROMORPHONE HCL 1 MG/ML IJ SOLN
INTRAMUSCULAR | Status: AC
  Filled 2023-01-04: qty 0.5

## 2023-01-04 MED ORDER — CLEVIDIPINE BUTYRATE 0.5 MG/ML IV EMUL
INTRAVENOUS | Status: DC | PRN
  Administered 2023-01-04: .5 mg/h via INTRAVENOUS

## 2023-01-04 MED ORDER — PROPOFOL 10 MG/ML IV BOLUS
INTRAVENOUS | Status: DC | PRN
  Administered 2023-01-04: 140 mg via INTRAVENOUS
  Administered 2023-01-04: 20 mg via INTRAVENOUS
  Administered 2023-01-04: 40 mg via INTRAVENOUS

## 2023-01-04 MED ORDER — ALUM & MAG HYDROXIDE-SIMETH 200-200-20 MG/5ML PO SUSP: 15.0000 mL | ORAL | Status: DC | PRN

## 2023-01-04 MED ORDER — MORPHINE SULFATE (PF) 2 MG/ML IV SOLN
2.0000 mg | INTRAVENOUS | Status: DC | PRN
  Administered 2023-01-04 (×2): 2 mg via INTRAVENOUS
  Filled 2023-01-04 (×2): qty 1

## 2023-01-04 MED ORDER — KETAMINE HCL 50 MG/5ML IJ SOSY
PREFILLED_SYRINGE | INTRAMUSCULAR | Status: AC
  Filled 2023-01-04: qty 5

## 2023-01-04 MED ORDER — ALBUMIN HUMAN 5 % IV SOLN: INTRAVENOUS | Status: DC | PRN

## 2023-01-04 MED ORDER — MAGNESIUM SULFATE 2 GM/50ML IV SOLN: 2.0000 g | Freq: Every day | INTRAVENOUS | Status: DC | PRN

## 2023-01-04 MED ORDER — ONDANSETRON HCL 4 MG/2ML IJ SOLN
4.0000 mg | Freq: Four times a day (QID) | INTRAMUSCULAR | Status: DC | PRN
  Filled 2023-01-04: qty 2

## 2023-01-04 MED ORDER — LABETALOL HCL 5 MG/ML IV SOLN
10.0000 mg | INTRAVENOUS | Status: AC | PRN
  Administered 2023-01-04 (×4): 10 mg via INTRAVENOUS
  Filled 2023-01-04 (×2): qty 4

## 2023-01-04 MED ORDER — DEXMEDETOMIDINE HCL IN NACL 80 MCG/20ML IV SOLN
INTRAVENOUS | Status: DC | PRN
  Administered 2023-01-04 (×2): 8 ug via INTRAVENOUS
  Administered 2023-01-04: 4 ug via INTRAVENOUS

## 2023-01-04 MED ORDER — ACETAMINOPHEN 10 MG/ML IV SOLN
INTRAVENOUS | Status: AC
  Filled 2023-01-04: qty 100

## 2023-01-04 MED ORDER — SODIUM CHLORIDE 0.9% FLUSH: 9.0000 mL | INTRAVENOUS | Status: DC | PRN

## 2023-01-04 MED ORDER — FENTANYL CITRATE (PF) 100 MCG/2ML IJ SOLN
25.0000 ug | INTRAMUSCULAR | Status: DC | PRN
  Administered 2023-01-04: 25 ug via INTRAVENOUS
  Administered 2023-01-04: 50 ug via INTRAVENOUS
  Administered 2023-01-04: 25 ug via INTRAVENOUS
  Administered 2023-01-04: 50 ug via INTRAVENOUS

## 2023-01-04 MED ORDER — HYDRALAZINE HCL 20 MG/ML IJ SOLN
5.0000 mg | INTRAMUSCULAR | Status: AC | PRN
  Administered 2023-01-04 (×2): 5 mg via INTRAVENOUS
  Filled 2023-01-04: qty 1

## 2023-01-04 MED ORDER — MIDAZOLAM HCL 2 MG/2ML IJ SOLN
INTRAMUSCULAR | Status: DC | PRN
  Administered 2023-01-04: 2 mg via INTRAVENOUS

## 2023-01-04 MED ORDER — HEPARIN SODIUM (PORCINE) 1000 UNIT/ML IJ SOLN
INTRAMUSCULAR | Status: DC | PRN
  Administered 2023-01-04 (×2): 2000 [IU] via INTRAVENOUS
  Administered 2023-01-04: 10000 [IU] via INTRAVENOUS

## 2023-01-04 SURGICAL SUPPLY — 52 items
ADH SKN CLS APL DERMABOND .7 (GAUZE/BANDAGES/DRESSINGS) ×4
AGENT HMST SPONGE THK3/8 (HEMOSTASIS)
BAG COUNTER SPONGE SURGICOUNT (BAG) ×2 IMPLANT
BAG SPNG CNTER NS LX DISP (BAG) ×2
BLADE CLIPPER SURG (BLADE) ×2 IMPLANT
CANISTER SUCT 3000ML PPV (MISCELLANEOUS) ×2 IMPLANT
CLIP TI MEDIUM 24 (CLIP) ×2 IMPLANT
CLIP TI WIDE RED SMALL 24 (CLIP) ×2 IMPLANT
DERMABOND ADVANCED .7 DNX12 (GAUZE/BANDAGES/DRESSINGS) IMPLANT
DRSG ADAPTIC 3X8 NADH LF (GAUZE/BANDAGES/DRESSINGS) IMPLANT
DRSG COVADERM 4X14 (GAUZE/BANDAGES/DRESSINGS) IMPLANT
ELECT BLADE 4.0 EZ CLEAN MEGAD (MISCELLANEOUS) ×2
ELECT BLADE 6.5 EXT (BLADE) IMPLANT
ELECT REM PT RETURN 9FT ADLT (ELECTROSURGICAL) ×4
ELECTRODE BLDE 4.0 EZ CLN MEGD (MISCELLANEOUS) ×2 IMPLANT
ELECTRODE REM PT RTRN 9FT ADLT (ELECTROSURGICAL) ×2 IMPLANT
FELT TEFLON 1X6 (MISCELLANEOUS) ×2 IMPLANT
GAUZE SPONGE 4X4 12PLY STRL (GAUZE/BANDAGES/DRESSINGS) IMPLANT
GLOVE BIO SURGEON STRL SZ7.5 (GLOVE) ×2 IMPLANT
GLOVE BIOGEL PI IND STRL 8 (GLOVE) ×2 IMPLANT
GOWN STRL REUS W/ TWL LRG LVL3 (GOWN DISPOSABLE) ×6 IMPLANT
GOWN STRL REUS W/ TWL XL LVL3 (GOWN DISPOSABLE) ×2 IMPLANT
GOWN STRL REUS W/TWL LRG LVL3 (GOWN DISPOSABLE) ×6
GOWN STRL REUS W/TWL XL LVL3 (GOWN DISPOSABLE) ×2
HEMOSTAT SNOW SURGICEL 2X4 (HEMOSTASIS) IMPLANT
HEMOSTAT SPONGE AVITENE ULTRA (HEMOSTASIS) IMPLANT
INSERT FOGARTY 61MM (MISCELLANEOUS) ×4 IMPLANT
INSERT FOGARTY SM (MISCELLANEOUS) ×8 IMPLANT
KIT BASIN OR (CUSTOM PROCEDURE TRAY) ×2 IMPLANT
KIT TURNOVER KIT B (KITS) ×2 IMPLANT
LOOP VASCULAR MINI 18 RED (MISCELLANEOUS) ×4
NS IRRIG 1000ML POUR BTL (IV SOLUTION) ×4 IMPLANT
PACK AORTA (CUSTOM PROCEDURE TRAY) ×2 IMPLANT
PAD ARMBOARD 7.5X6 YLW CONV (MISCELLANEOUS) ×4 IMPLANT
RETAINER VISCERA MED (MISCELLANEOUS) ×2 IMPLANT
STAPLER VISISTAT 35W (STAPLE) ×4 IMPLANT
SUT ETHIBOND 5 LR DA (SUTURE) IMPLANT
SUT MNCRL AB 4-0 PS2 18 (SUTURE) IMPLANT
SUT PDS AB 1 TP1 96 (SUTURE) ×4 IMPLANT
SUT PROLENE 3 0 SH1 36 (SUTURE) ×2 IMPLANT
SUT PROLENE 5 0 C 1 24 (SUTURE) IMPLANT
SUT PROLENE 5 0 C 1 36 (SUTURE) IMPLANT
SUT PROLENE 6 0 BV (SUTURE) IMPLANT
SUT SILK 2 0 SH CR/8 (SUTURE) ×2 IMPLANT
SUT VIC AB 2-0 CT1 36 (SUTURE) ×4 IMPLANT
SUT VIC AB 3-0 SH 27 (SUTURE) ×10
SUT VIC AB 3-0 SH 27X BRD (SUTURE) IMPLANT
TOWEL GREEN STERILE (TOWEL DISPOSABLE) ×2 IMPLANT
TOWEL ~~LOC~~+RFID 17X26 BLUE (SPONGE) ×4 IMPLANT
TRAY FOLEY MTR SLVR 16FR STAT (SET/KITS/TRAYS/PACK) ×2 IMPLANT
VASCULAR TIE MINI RED 18IN STL (MISCELLANEOUS) IMPLANT
WATER STERILE IRR 1000ML POUR (IV SOLUTION) ×4 IMPLANT

## 2023-01-04 NOTE — Anesthesia Procedure Notes (Signed)
Central Venous Catheter Insertion Performed by: Achille Rich, MD, anesthesiologist Start/End7/26/2024 7:15 AM, 01/04/2023 7:28 AM Patient location: Pre-op. Preanesthetic checklist: patient identified, IV checked, site marked, risks and benefits discussed, surgical consent, monitors and equipment checked, pre-op evaluation, timeout performed and anesthesia consent Position: Trendelenburg Lidocaine 1% used for infiltration and patient sedated Hand hygiene performed , maximum sterile barriers used  and Seldinger technique used Catheter size: 7 Fr Central line was placed.Double lumen Procedure performed using ultrasound guided technique. Ultrasound Notes:anatomy identified, needle tip was noted to be adjacent to the nerve/plexus identified and no ultrasound evidence of intravascular and/or intraneural injection Attempts: 1 Following insertion, line sutured, dressing applied and Biopatch. Post procedure assessment: blood return through all ports, free fluid flow and no air  Patient tolerated the procedure well with no immediate complications.

## 2023-01-04 NOTE — Op Note (Signed)
      Date: January 04, 2023   Preoperative diagnosis: Chronic mesenteric ischemia with occluded SMA stent   Postoperative diagnosis: Same   Procedure: 1.  Lysis of abdominal adhesions greater than 30 minutes 2.  Harvest of right leg great saphenous vein 3.  Retrograde left common iliac artery to superior mesenteric artery bypass using great saphenous vein (direct open retrograde revascularization)   Surgeon: Lemar Livings, MD   Co-surgeon:  Cephus Shelling, MD   Assistant: Nathanial Rancher, PA   Indications:  57 year old female who previously presented last year on Thanksgiving with acute mesenteric ischemia with SMA thrombus.  She underwent percutaneous thrombectomy with SMA stenting.  The next day she required laparotomy and was found to have hemoperitoneum.  Ultimately was discharged and did well and later her stent occluded in January of this year and she underwent a second percutaneous intervention this year with percutaneous thrombectomy and additional stenting of her SMA and she was ultimately doing well until she re-occluded her SMA stents again.  She now presents for mesenteric bypass after risks benefits discussed.  Does not show feel she can tolerate her symptoms.  A co-surgeon was needed for the complexity of the case including lysis of abdominal adhesions as well as the complex bypass required.   Findings: The right great saphenous vein was harvested and was of excellent caliber.  Midline laparotomy was performed and we did extensive lysis of adhesions given previous laparotomy last year.  Ultimately the left common iliac artery was dissected out and controlled with Vesseloops.  We dissected out the SMA distal to the occluded SMA stent including all the branches.  The great saphenous vein bypass graft was sewn end to side to the left common iliac artery and then tunneled retrograde through the small bowel mesentery to the SMA and sewn end to side to the SMA.  Excellent pulse in the  bypass at completion with excellent Doppler flow in the SMA.   Operative details: I was present for the entirety of the case and harvesting the right greater saphenous vein as well as assisted with extensive lysis of adhesions and exposure of the left common iliac artery and superior mesenteric artery and also assisted with setting the anastomoses.   EBL: 150 mL   Wildon Cuevas C. Randie Heinz, MD [facility] Office: [phone] Pager: [phone]

## 2023-01-04 NOTE — Op Note (Signed)
Date: January 04, 2023  Preoperative diagnosis: Chronic mesenteric ischemia with occluded SMA stent  Postoperative diagnosis: Same  Procedure: 1.  Lysis of abdominal adhesions greater than 30 minutes 2.  Harvest of right leg great saphenous vein 3.  Retrograde left common iliac artery to superior mesenteric artery bypass using great saphenous vein (direct open retrograde revascularization)  Surgeon: Dr. Cephus Shelling, MD  Co-surgeon: Dr. Lemar Livings, MD  Assistant: Nathanial Rancher, PA  Indications: Patient is a 57 year old female who previously presented last year on Thanksgiving with acute mesenteric ischemia with SMA thrombus.  She underwent percutaneous thrombectomy with SMA stenting.  The next day she required laparotomy and was found to have hemoperitoneum.  Ultimately was discharged and did well and later her stent occluded in January of this year and she underwent a second percutaneous intervention this year with percutaneous thrombectomy and additional stenting of her SMA and she was ultimately doing well until she re-occluded her SMA stents again.  She now presents for mesenteric bypass after risks benefits discussed.  Does not show feel she can tolerate her symptoms.  A co-surgeon was needed for the complexity of the case including lysis of abdominal adhesions as well as the complex bypass required.  Findings: The right great saphenous vein was harvested and was of excellent caliber.  Midline laparotomy was performed and we did extensive lysis of adhesions given previous laparotomy last year.  Ultimately the left common iliac artery was dissected out and controlled with Vesseloops.  We dissected out the SMA distal to the occluded SMA stent including all the branches.  The great saphenous vein bypass graft was sewn end to side to the left common iliac artery and then tunneled retrograde through the small bowel mesentery to the SMA and sewn end to side to the SMA.  Excellent pulse in  the bypass at completion with excellent Doppler flow in the SMA.  Anesthesia: General  EBL: 150 mL  Details: Patient was taken to the operating room after informed consent was obtained.  Placed on the operative table supine position.  General endotracheal anesthesia was induced.  An NG tube was placed.  I did evaluate the right thigh with ultrasound and there was an excellent great saphenous vein that was marked.  The right thigh as well as abdomen were then prepped and draped in standard sterile fashion.  Antibiotics were given and timeout performed.  Dr. Randie Heinz initially started with multiple skip incisions in the right thigh and harvested the great saphenous vein.  All side branches were controlled with 3-0 silk ties and these were divided.  He harvested this all the way to the saphenofemoral junction and then it was harvested and passed off the field reversed with a dilator and of excellent caliber.  I started with our PA Corrie and we made a midline laparotomy incision.  I then went through the subcutaneous tissue with Bovie cautery.  I went up high above her previous laparotomy incision we entered the peritoneal cavity.  There were adhesions that were gently taken down to enter the peritoneal cavity.  I then opened the entire midline fascia and a large retractor was placed on the field with body walls on both sides.  We then spent over 30 minutes performing extensive lysis of adhesions including the transverse colon with omental adhesions into the pelvis and also small bowel adhesions.  Finally was able to get the transverse colon cephalad and wrapped in a towel and I was able to eviscerate small bowel  in the right upper quadrant.  Initially went to the left common iliac artery and the retroperitoneum was opened with Bovie cautery.  The left common iliac artery was dissected out proximally distally and controlled with a vessel loop.  We then repositioned our retractors.  We went to the base of the small  bowel mesentery and we could palpate the previously occluded SMA stent.  Ultimately we used Bovie cautery to dissect out the SMA including its branches distal to the occluded stent where it was patent on CT imaging.  The SMA and all the branches were controlled with small Vesseloops.  We then created a tunnel on the small bowel mesentery between the left common iliac artery and SMA where we could almost touch our fingers.  We passed a umbilical tape.  Patient was then given 100 units/kg IV heparin.  We checked an ACT to maintain greater than 250.  Dr. Randie Heinz and I then spatulated the vein graft and this was sewn end to side to the left common iliac artery in retrograde fashion after the artery was controlled with Henley clamps and opened with 11 blade scalpel and Potts scissors.  The graft was sewn to the left common iliac artery with 5-0 Prolene parachute technique.  We had excellent pulsatile flow in the bypass graft.  The vein graft was then marked for orientation.  The bypass graft was carefully passed through our tunnel created in the small bowel mesentery using a right angle clamp ensure this was not twisted.  We then cut it to the appropriate length.  We then repositioned our retractors and again exposed the SMA and all its branches and these were controlled with Vesseloops and then we opened the anterior wall of the SMA with 11 blade scalpel and Potts scissors.  We then sewed the vein graft end to side anastomosis to the SMA with 5-0 Prolene parachute technique and this was de-aired prior to completion.  Excellent Doppler flow in the SMA distal to the bypass with a palpable pulse in the bypass graft.  Protamine was given after we confirmed we also had a pulse in the left foot.  All the saphenous vein harvest incisions were closed with 3-0 Vicryl 4-0 Monocryl and Dermabond.  The midline fascia was run closed with double-stranded #1 PDS as well as 2-0 and 3-0 Vicryl and staples in the skin with dry sterile  dressings.  Complication: None  Condition: Stable  Cephus Shelling, MD [facility] [place] Office: [phone]   Cephus Shelling

## 2023-01-04 NOTE — Transfer of Care (Signed)
Immediate Anesthesia Transfer of Care Note  Patient: [name]  Procedure(s) Performed: OPEN MESENTERIC BYPASS left common iliac to SMA, lysis of adhesions VEIN HARVEST RIGHT GREATER SAPHENOUS  Patient Location: PACU  Anesthesia Type:General  Level of Consciousness: awake and patient cooperative  Airway & Oxygen Therapy: Patient Spontanous Breathing and Patient connected to face mask oxygen  Post-op Assessment: Report given to RN and Post -op Vital signs reviewed and stable  Post vital signs: Reviewed and stable  Last Vitals:  Vitals Value Taken Time  BP 120/73 01/04/23 1206  Temp 96.3   Pulse 86 01/04/23 1209  Resp 17 01/04/23 1209  SpO2 97 % 01/04/23 1209  Vitals shown include unfiled device data.  Last Pain:  Vitals:   01/04/23 0616  TempSrc:   PainSc: 7       Patients Stated Pain Goal: 0 (01/04/23 0616)  Complications: No notable events documented.

## 2023-01-04 NOTE — H&P (Signed)
History and Physical Interval Note:  01/04/2023 7:27 AM  [name]  has presented today for surgery, with the diagnosis of Chronic mesenteric ischemia with occluded superior mesenteric artery stent.  The various methods of treatment have been discussed with the patient and family. After consideration of risks, benefits and other options for treatment, the patient has consented to  Procedure(s): OPEN MESENTERIC BYPASS INCLUDING AORTA CELIAC, AORTA SUPERIOR MESENTERIC ARTERY (N/A) as a surgical intervention.  The patient's history has been reviewed, patient examined, no change in status, stable for surgery.  I have reviewed the patient's chart and labs.  Questions were answered to the patient's satisfaction.    Plan SMA bypass given occluded SMA stent.  Cannot tolerate her symptoms.  Discussed may come retrograde off the iliac artery for mesenteric bypass.  High risk given obese and previous laparotomy.    Cephus Shelling     Patient name: [name]        MRN: [id number]        DOB: [birth year]        Sex: female   REASON FOR CONSULT: Follow-up after CTA for previous SMA intervention for acute mesenteric ischemia   HPI: [name] is a 57 y.o. female, that presents for follow-up after CTA after multiple previous SMA interventions.   She initially underwent percutaneous mechanical thrombectomy of the SMA with angioplasty and stenting on 05/03/2022 for acute mesenteric ischemia with SMA thrombus.  She did require ex lap with all viable bowel but had evidence of hemoperitoneum with no bleeding source identified by general surgery. She had a slow hospital course but ultimately was discharged home on aspirin and Plavix.      I subsequently sent her for CTA abdomen pelvis given her slow clinical progress.  Her CTA 06/23/22 showed recurrent thrombus in the SMA stent with associated thrombus in her visceral aorta as demonstrated on previous imaging.  I started her on Eliquis.  I took  her back to the Cath Lab on 07/05/2022 for percutaneous thrombectomy of the SMA stent and extension of the SMA stent into the abdominal aorta.   She was doing well until a month ago when she started having postprandial pain.  I sent her for CTA.  Today states she is not doing well.  Gets abdominal pain after eating.  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  States she gets diarrhea immediately after eating.       Past Medical History:  Diagnosis Date   Anemia     Fibroid     Hyperlipidemia     NSTEMI (non-ST elevated myocardial infarction) (HCC) 06/2017    s/p DES to LCX 07/02/17   Sarcoidosis                 Past Surgical History:  Procedure Laterality Date   ABDOMINAL AORTOGRAM N/A 07/05/2022    Procedure: ABDOMINAL AORTOGRAM;  Surgeon: Cephus Shelling, MD;  Location: [facility] INVASIVE CV LAB;  Service: Cardiovascular;  Laterality: N/A;   ABDOMINAL HYSTERECTOMY       BREAST BIOPSY Right 03/24/2018   BREAST REDUCTION SURGERY Bilateral 10/17/2020    Procedure: MAMMARY REDUCTION  (BREAST);  Surgeon: Allena Napoleon, MD;  Location: [facility] OR;  Service: Plastics;  Laterality: Bilateral;  2 hours   CESAREAN SECTION   1987   CORONARY/GRAFT ACUTE MI REVASCULARIZATION N/A 06/30/2017    Procedure: Coronary/Graft Acute MI Revascularization;  Surgeon: Runell Gess, MD;  Location:  [facility] INVASIVE CV LAB;  Service: Cardiovascular;  Laterality: N/A;   FOREARM FRACTURE SURGERY Right 1977   FRACTURE SURGERY       LAPAROSCOPY N/A 05/04/2022    Procedure: LAPAROSCOPY DIAGNOSTIC;  Surgeon: Axel Filler, MD;  Location: [facility] OR;  Service: General;  Laterality: N/A;   LAPAROTOMY N/A 05/04/2022    Procedure: EXPLORATORY LAPAROTOMY;  Surgeon: Axel Filler, MD;  Location: [facility] OR;  Service: General;  Laterality: N/A;   LEFT HEART CATH AND CORONARY ANGIOGRAPHY N/A 06/30/2017    Procedure: LEFT HEART CATH AND CORONARY ANGIOGRAPHY;  Surgeon: Runell Gess, MD;  Location: [facility] INVASIVE CV LAB;   Service: Cardiovascular;  Laterality: N/A;   LEFT HEART CATH AND CORONARY ANGIOGRAPHY N/A 12/23/2017    Procedure: LEFT HEART CATH AND CORONARY ANGIOGRAPHY;  Surgeon: Runell Gess, MD;  Location: [facility] INVASIVE CV LAB;  Service: Cardiovascular;  Laterality: N/A;   PERIPHERAL VASCULAR INTERVENTION   07/05/2022    Procedure: PERIPHERAL VASCULAR INTERVENTION;  Surgeon: Cephus Shelling, MD;  Location: [facility] INVASIVE CV LAB;  Service: Cardiovascular;;  SMA   PERIPHERAL VASCULAR THROMBECTOMY N/A 07/05/2022    Procedure: PERIPHERAL VASCULAR THROMBECTOMY;  Surgeon: Cephus Shelling, MD;  Location: [facility] INVASIVE CV LAB;  Service: Cardiovascular;  Laterality: N/A;  SMA   TUBAL LIGATION   1990               Family History  Problem Relation Age of Onset   Diabetes Mother     Stroke Mother 62   Hypertension Mother     Hyperlipidemia Father     Stroke Father 41   Hypertension Father     Breast cancer Paternal Grandmother            SOCIAL HISTORY: Social History         Socioeconomic History   Marital status: Widowed      Spouse name: Not on file   Number of children: 2   Years of education: Not on file   Highest education level: Not on file  Occupational History   Occupation: Administrator, 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  Tobacco Use   Smoking status: Some Days      Packs/day: 1.00      Years: 29.00      Additional pack years: 0.00      Total pack years: 29.00      Types: Cigarettes      Last attempt to quit: 03/19/2018      Years since quitting: 4.7   Smokeless tobacco: Never   Tobacco comments:      11/09/2021 Patient smokes some days  started back in 2023  Vaping Use   Vaping Use: Never used  Substance and Sexual Activity   Alcohol use: Not Currently      Comment: /no   Drug use: Never   Sexual activity: Not on file  Other Topics Concern   Not on file  Social History Narrative    Are you right handed or left handed? right    Are you currently employed  ?     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 befriend    Who lives with you?     What type of home do you live in: 1 story or 2 story? two         Social Determinants of Health  Financial Resource Strain: Not on file  Food Insecurity: No Food Insecurity (05/04/2022)    Hunger Vital Sign     Worried About Running Out of Food in the Last Year: Never true     Ran Out of Food in the Last Year: Never true  Transportation Needs: No Transportation Needs (05/04/2022)    PRAPARE - Therapist, art (Medical): No     Lack of Transportation (Non-Medical): No  Physical Activity: Not on file  Stress: Not on file  Social Connections: Not on file  Intimate Partner Violence: Not At Risk (05/04/2022)    Humiliation, Afraid, Rape, and Kick questionnaire     Fear of Current or Ex-Partner: No     Emotionally Abused: No     Physically Abused: No     Sexually Abused: No      Allergies       Allergies  Allergen Reactions   Penicillins Itching and Rash      Caused rash and blisters   Hydrocodone Hives and Itching              Current Outpatient Medications  Medication Sig Dispense Refill   acetaminophen (TYLENOL) 500 MG tablet Take 1,000 mg by mouth every 6 (six) hours as needed for moderate pain.       apixaban (ELIQUIS) 5 MG TABS tablet Take 1 tablet (5 mg total) by mouth 2 (two) times daily. 60 tablet 5   aspirin EC 81 MG tablet Take 1 tablet (81 mg total) by mouth daily. Swallow whole. 30 tablet     atorvastatin (LIPITOR) 40 MG tablet TAKE 1 TABLET(40 MG) BY MOUTH DAILY 30 tablet 0   clopidogrel (PLAVIX) 75 MG tablet Take 1 tablet (75 mg total) by mouth daily with breakfast. 30 tablet 11   famotidine (PEPCID) 20 MG tablet Take 20 mg by mouth daily as needed for heartburn or indigestion.       gabapentin (NEURONTIN) 300 MG capsule Take 1 capsule (300 mg) in the morning and take 2 capsules (600 mg) at  bedtime 180 capsule 3      No current facility-administered medications for this visit.        REVIEW OF SYSTEMS:  [X]  denotes positive finding, [ ]  denotes negative finding Cardiac   Comments:  Chest pain or chest pressure:      Shortness of breath upon exertion:      Short of breath when lying flat:      Irregular heart rhythm:             Vascular      Pain in calf, thigh, or hip brought on by ambulation:      Pain in feet at night that wakes you up from your sleep:       Blood clot in your veins:      Leg swelling:              Pulmonary      Oxygen at home:      Productive cough:       Wheezing:              Neurologic      Sudden weakness in arms or legs:       Sudden numbness in arms or legs:       Sudden onset of difficulty speaking or slurred speech:      Temporary loss of vision in one eye:  Problems with dizziness:              Gastrointestinal      Blood in stool:       Vomited blood:       Abdominal pain x    Genitourinary      Burning when urinating:       Blood in urine:             Psychiatric      Major depression:              Hematologic      Bleeding problems:      Problems with blood clotting too easily:             Skin      Rashes or ulcers:             Constitutional      Fever or chills:          PHYSICAL EXAM: There were no vitals filed for this visit.     GENERAL: The patient is a well-nourished female, in no acute distress. The vital signs are documented above. CARDIAC: There is a regular rate and rhythm.  VASCULAR: Palpable femoral pulses bilatearlly PULMONARY: No respiratory distress. ABDOMEN: Midline incision healed.  No rebound or guarding.  No peritonitis.  No significant pain on my exam.. MUSCULOSKELETAL: There are no major deformities or cyanosis. NEUROLOGIC: No focal weakness or paresthesias are detected. PSYCHIATRIC: The patient has a normal affect.   DATA:    CTA abdomen pelvis 12/04/2022 with evidence of  thrombosis of previously placed SMA stent x 2.  There is reconstitution of the SMA through large collateral from the GDA off the celiac.  Celiac artery has moderate stenosis.   Assessment/Plan:   57 y.o. female that presents for follow-up after CTA abdomen pelvis given recurrent postprandial pain in the setting of multiple previous SMA interventions.  She initially had mechanical thrombectomy of the SMA with angioplasty and stenting on 05/03/2022 for acute mesenteric ischemia with SMA thrombus.  She did require laparotomy by general surgery on 05/04/2022 with findings of hemoperitoneum with no bleeding source found.   She had a slow clinical course and we got a repeat CTA in January 2024 that showed thrombus in her proximal SMA stent likely extension from chronic mural thrombus in the descending thoracic and abdominal visceral aorta.  I took her back to the Cath Lab on 07/05/2022 with extension of the SMA stent into the abdominal aorta with an additional VBX.     She was doing well until a month ago when she had recurrent symptoms.  I sent her for additional CTA.  Now her SMA stents are thrombosed for a second time.  This is even on triple therapy with aspirin, Eliquis, Plavix.  Stents look to be adequately positioned.  Discussed I do not think a third percutaneous intervention would be worthwhile.  I have recommended open mesenteric bypass likely from the supraceliac aorta to the celiac and SMA.  Some chance we decide to come retrograde off the iliac although I think this is probably the second option.  I discussed this is major abdominal surgery that will require extensive recovery of likely 2 to 3 months.  I discussed major risk of bleeding, bowel ischemia, bowel injury, MI, pulmonary complications, renal insufficiency and/or failure, even 5% risk of death.  This will be more challenging with her morbid obesity.  She does not feel she can tolerate her symptoms which  we discussed as an alternative.  I have  scheduled her for 01/04/2023 and will have my partner Dr. Randie Heinz co-surgeon given the complexity of this intervention.       Cephus Shelling, MD [facility] Office: [phone]

## 2023-01-04 NOTE — Anesthesia Procedure Notes (Signed)
Arterial Line Insertion Start/End7/26/2024 7:15 AM, 01/04/2023 7:20 AM Performed by: Achille Rich, MD, CRNA  Preanesthetic checklist: patient identified, IV checked, site marked, risks and benefits discussed, surgical consent, monitors and equipment checked, pre-op evaluation, timeout performed and anesthesia consent Lidocaine 1% used for infiltration and patient sedated Right, radial was placed Catheter size: 20 G Hand hygiene performed  and maximum sterile barriers used   Attempts: 2 Procedure performed without using ultrasound guided technique. Following insertion, Biopatch and dressing applied. Post procedure assessment: normal  Patient tolerated the procedure well with no immediate complications.

## 2023-01-04 NOTE — Anesthesia Procedure Notes (Signed)
Procedure Name: Intubation Date/Time: 01/04/2023 7:54 AM  Performed by: Loleta Harolyn Cocker, CRNAPre-anesthesia Checklist: Patient identified, Patient being monitored, Timeout performed, Emergency Drugs available and Suction available Patient Re-evaluated:Patient Re-evaluated prior to induction Oxygen Delivery Method: Circle system utilized Preoxygenation: Pre-oxygenation with 100% oxygen Induction Type: IV induction Ventilation: Mask ventilation without difficulty and Oral airway inserted - appropriate to patient size Laryngoscope Size: Mac and 4 Grade View: Grade I Tube type: Oral Tube size: 7.0 mm Number of attempts: 1 Airway Equipment and Method: Stylet Placement Confirmation: ETT inserted through vocal cords under direct vision, positive ETCO2 and breath sounds checked- equal and bilateral Secured at: 22 cm Tube secured with: Tape Dental Injury: Teeth and Oropharynx as per pre-operative assessment

## 2023-01-05 ENCOUNTER — Encounter (HOSPITAL_COMMUNITY): Payer: Self-pay | Admitting: Vascular Surgery

## 2023-01-05 MED ORDER — METHOCARBAMOL 1000 MG/10ML IJ SOLN
500.0000 mg | Freq: Four times a day (QID) | INTRAVENOUS | Status: DC | PRN
  Administered 2023-01-05 – 2023-01-10 (×12): 500 mg via INTRAVENOUS
  Filled 2023-01-05: qty 5
  Filled 2023-01-05 (×3): qty 500
  Filled 2023-01-05: qty 5
  Filled 2023-01-05: qty 500
  Filled 2023-01-05: qty 5
  Filled 2023-01-05 (×6): qty 500

## 2023-01-05 MED ORDER — CHLORHEXIDINE GLUCONATE CLOTH 2 % EX PADS
6.0000 | MEDICATED_PAD | Freq: Every day | CUTANEOUS | Status: DC
  Administered 2023-01-05 – 2023-01-11 (×7): 6 via TOPICAL

## 2023-01-05 NOTE — Progress Notes (Addendum)
  VASCULAR SURGERY ASSESSMENT & PLAN:   POD 1 RETROGRADE MESENTERIC BYPASS: Her abdomen is still quiet.  Will leave the NG tube.  Complaining of muscle spasms.  I have written for IV Robaxin.  Resume aspirin when taking p.o.  Will hold off on resuming Plavix for now.  No plans to resume Eliquis.  Also resume her statin once she is taking p.o.  CARDIAC: Hemodynamically stable.  PULMONARY: Good oxygenation.  Encourage I-S.  Out of bed today.  RENAL: Normal renal function and good urine output.  DVT PROPHYLAXIS: She is on subcu heparin.  Possible transfer to 4 E later today.  SUBJECTIVE:   Complaining of muscle spasms.  PHYSICAL EXAM:   Vitals:   01/05/23 0400 01/05/23 0439 01/05/23 0500 01/05/23 0600  BP:      Pulse: 78  82 80  Resp: 20  16 12   Temp:  98.3 F (36.8 C)    TempSrc:  Oral    SpO2: 100%  99% 100%  Weight:      Height:       Her dressing is dry. No bowel sounds. Her right thigh incisions look fine. Lungs are clear.  LABS:   Lab Results  Component Value Date   WBC 7.8 01/05/2023   HGB 10.4 (L) 01/05/2023   HCT 31.7 (L) 01/05/2023   MCV 85.4 01/05/2023   PLT 265 01/05/2023   Lab Results  Component Value Date   CREATININE 0.78 01/05/2023   Lab Results  Component Value Date   INR 1.2 01/04/2023   CBG (last 3)  No results for input(s): "GLUCAP" in the last 72 hours.  PROBLEM LIST:    Principal Problem:   Mesenteric ischemia (HCC)   CURRENT MEDS:    aspirin EC  81 mg Oral Q0600   Chlorhexidine Gluconate Cloth  6 each Topical Daily   docusate sodium  100 mg Oral Daily   heparin  5,000 Units Subcutaneous Q8H   morphine   Intravenous Q4H   pantoprazole (PROTONIX) IV  40 mg Intravenous QHS    Waverly Ferrari Office: [phone] 01/05/2023

## 2023-01-05 NOTE — Evaluation (Signed)
Physical Therapy Evaluation Patient Details Name: [name] MRN: [id number] DOB: 02/03/1966 Today's Date: 01/05/2023  History of Present Illness  57 yo female admitted 7/26 for mesenteric artery BPG due to chronic mesenteric ischemia with occluded superior mesenteric artery stent. PMhx: CAD, HLD, sarcoidosis, NSTEMI, anemia  Clinical Impression  Pt pleasant and willing to mobilize with cues for sequence and safety. Pt slightly anxious with line movement wanting to make sure nothing is pulled and educated for splinting abdomen for comfort. Pt with decreased strength, transfers, functional mobility and gait who will benefit from acute therapy to maximize mobility, safety, function and independence. 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 Encouraged deep breathing and gait.   HR 87-94 SpO2 88-93% on RA        Assistance Recommended at Discharge Intermittent Supervision/Assistance  If plan is discharge home, recommend the following:  Can travel by private vehicle  A little help with walking and/or transfers;A little help with bathing/dressing/bathroom;Assistance with cooking/housework;Assist for transportation;Help with stairs or ramp for entrance        Equipment Recommendations Rolling walker (2 wheels)  Recommendations for Other Services       Functional Status Assessment Patient has had a recent decline in their functional status and demonstrates the ability to make significant improvements in function in a reasonable and predictable amount of time.     Precautions / Restrictions Precautions Precautions: Other (comment) Precaution Comments: NGT      Mobility  Bed Mobility Overal bed mobility: Needs Assistance Bed Mobility: Supine to Sit     Supine to sit: HOB elevated     General bed mobility comments: HOB 35 degrees with cues for splinting abdomen with pillow, min assist to pivot to right side of bed  with cues for  sequence and increased time    Transfers Overall transfer level: Needs assistance   Transfers: Sit to/from Stand Sit to Stand: Min assist           General transfer comment: min assist to rise from surface with cues for hand placement    Ambulation/Gait Ambulation/Gait assistance: Min assist Gait Distance (Feet): 100 Feet Assistive device: Rolling walker (2 wheels) Gait Pattern/deviations: Step-through pattern, Decreased stride length, Trunk flexed   Gait velocity interpretation: <1.8 ft/sec, indicate of risk for recurrent falls   General Gait Details: cues for posture, proximity to RW and safety. pt limited by fatigue and able to self-regulate distance  Stairs            Wheelchair Mobility     Tilt Bed    Modified Rankin (Stroke Patients Only)       Balance Overall balance assessment: Mild deficits observed, not formally tested                                           Pertinent Vitals/Pain Pain Assessment Pain Assessment: 0-10 Pain Score: 9  Pain Location: abdomen Pain Descriptors / Indicators: Aching, Cramping Pain Intervention(s): Limited activity within patient's tolerance, Monitored during session, PCA encouraged (pt refusing PCA)    Home Living Family/patient expects to be discharged to:: Private residence Living Arrangements: Spouse/significant other Available Help at Discharge: Family;Other (Comment);Available 24 hours/day Type of Home: Apartment Home Access: Stairs to enter Entrance Stairs-Rails: Right;Left Entrance Stairs-Number of Steps: flight   Home Layout: One level Home Equipment: Cane - single  point;Toilet riser      Prior Function Prior Level of Function : Independent/Modified Independent;Driving;Working/employed             Mobility Comments: Pt was independent, driving, works as a city Mid[employer].       Hand Dominance        Extremity/Trunk Assessment   Upper Extremity Assessment Upper  Extremity Assessment: Overall WFL for tasks assessed    Lower Extremity Assessment Lower Extremity Assessment: Generalized weakness    Cervical / Trunk Assessment Cervical / Trunk Assessment: Other exceptions Cervical / Trunk Exceptions: abdominal guarding  Communication   Communication: No difficulties  Cognition Arousal/Alertness: Awake/alert Behavior During Therapy: Flat affect Overall Cognitive Status: Impaired/Different from baseline Area of Impairment: Orientation                 Orientation Level: Time                      General Comments      Exercises     Assessment/Plan    PT Assessment Patient needs continued PT services  PT Problem List Decreased strength;Decreased activity tolerance;Decreased balance;Decreased knowledge of use of DME;Pain       PT Treatment Interventions DME instruction;Gait training;Stair training;Functional mobility training;Therapeutic activities;Patient/family education;Therapeutic exercise;Balance training    PT Goals (Current goals can be found in the Care Plan section)  Acute Rehab PT Goals Patient Stated Goal: return home, watch scary movies PT Goal Formulation: With patient/family Time For Goal Achievement: 01/19/23 Potential to Achieve Goals: Good    Frequency Min 1X/week     Co-evaluation               AM-PAC PT "6 Clicks" Mobility  Outcome Measure Help needed turning from your back to your side while in a flat bed without using bedrails?: A Little Help needed moving from lying on your back to sitting on the side of a flat bed without using bedrails?: A Little Help needed moving to and from a bed to a chair (including a wheelchair)?: A Little Help needed standing up from a chair using your arms (e.g., wheelchair or bedside chair)?: A Little Help needed to walk in hospital room?: A Little Help needed climbing 3-5 steps with a railing? : A Lot 6 Click Score: 17    End of Session   Activity  Tolerance: Patient tolerated treatment well Patient left: in chair;with call bell/phone within reach;with family/visitor present;with nursing/sitter in room Nurse Communication: Mobility status PT Visit Diagnosis: Other abnormalities of gait and mobility (R26.89)    Time: 1610-9604 PT Time Calculation (min) (ACUTE ONLY): 31 min   Charges:   PT Evaluation $PT Eval Moderate Complexity: 1 Mod PT Treatments $Therapeutic Activity: 8-22 mins PT General Charges $$ ACUTE PT VISIT: 1 Visit         Merryl Hacker, PT Acute Rehabilitation Services Office: [phone]   Enedina Finner Merek Niu 01/05/2023, 9:03 AM

## 2023-01-05 NOTE — Anesthesia Postprocedure Evaluation (Signed)
Anesthesia Post Note  Patient: [name]  Procedure(s) Performed: OPEN MESENTERIC BYPASS left common iliac to SMA, lysis of adhesions VEIN HARVEST RIGHT GREATER SAPHENOUS     Patient location during evaluation: PACU Anesthesia Type: General Level of consciousness: awake and alert Pain management: pain level controlled Vital Signs Assessment: post-procedure vital signs reviewed and stable Respiratory status: spontaneous breathing, nonlabored ventilation, respiratory function stable and patient connected to nasal cannula oxygen Cardiovascular status: blood pressure returned to baseline and stable Postop Assessment: no apparent nausea or vomiting Anesthetic complications: no   No notable events documented.  Last Vitals:  Vitals:   01/05/23 0500 01/05/23 0600  BP:    Pulse: 82 80  Resp: 16 12  Temp:    SpO2: 99% 100%    Last Pain:  Vitals:   01/05/23 0439  TempSrc: Oral  PainSc:                  Birgit Nowling S

## 2023-01-05 NOTE — Evaluation (Signed)
Occupational Therapy Evaluation Patient Details Name: [name] MRN: [id number] DOB: 10/09/1965 Today's Date: 01/05/2023   History of Present Illness 57 yo female admitted 7/26 for mesenteric artery BPG due to chronic mesenteric ischemia with occluded superior mesenteric artery stent. PMhx: CAD, HLD, sarcoidosis, NSTEMI, anemia   Clinical Impression   Pt reports ind at baseline with ADLs and functional mobility, lives with boyfriend in second floor apartment. Pt reports 10/10 pain despite pain medication and muscle relaxer, able to perform simulated ALDs with set up -max A, min guard for bed mobility and min A for transfers with RW. Pt educated on log rolling but prefers to pivot for bed mobility and using pillow to brace for comfort. Pt presenting with impairments listed below, will follow acutely. Recommend HHOT at d/c.       Recommendations for follow up therapy are one component of a multi-disciplinary discharge planning process, led by the attending physician.  Recommendations may be updated based on patient status, additional functional criteria and insurance authorization.   Assistance Recommended at Discharge Intermittent Supervision/Assistance  Patient can return home with the following A little help with walking and/or transfers;A lot of help with bathing/dressing/bathroom;Assistance with cooking/housework;Direct supervision/assist for medications management;Direct supervision/assist for financial management;Assist for transportation;Help with stairs or ramp for entrance    Functional Status Assessment  Patient has had a recent decline in their functional status and demonstrates the ability to make significant improvements in function in a reasonable and predictable amount of time.  Equipment Recommendations  Tub/shower seat    Recommendations for Other Services PT consult     Precautions / Restrictions Precautions Precautions: Other (comment) Precaution Comments:  NGT Restrictions Weight Bearing Restrictions: No      Mobility Bed Mobility Overal bed mobility: Needs Assistance Bed Mobility: Supine to Sit     Supine to sit: HOB elevated, Min guard          Transfers Overall transfer level: Needs assistance Equipment used: Rolling walker (2 wheels) Transfers: Sit to/from Stand Sit to Stand: Min assist           General transfer comment: min assist to rise from surface with cues for hand placement      Balance Overall balance assessment: Mild deficits observed, not formally tested                                         ADL either performed or assessed with clinical judgement   ADL Overall ADL's : Needs assistance/impaired Eating/Feeding: NPO   Grooming: Set up;Sitting   Upper Body Bathing: Moderate assistance   Lower Body Bathing: Maximal assistance   Upper Body Dressing : Moderate assistance   Lower Body Dressing: Maximal assistance   Toilet Transfer: Minimal assistance;Stand-pivot;Rolling walker (2 wheels);BSC/3in1   Toileting- Clothing Manipulation and Hygiene: Total assistance Toileting - Clothing Manipulation Details (indicate cue type and reason): catheter     Functional mobility during ADLs: Minimal assistance;Rolling walker (2 wheels)       Vision   Vision Assessment?: No apparent visual deficits     Perception Perception Perception Tested?: No   Praxis Praxis Praxis tested?: Not tested    Pertinent Vitals/Pain Pain Assessment Pain Assessment: Faces Pain Score: 10-Worst pain ever Faces Pain Scale: Hurts worst Pain Location: abdomen Pain Descriptors / Indicators: Aching, Cramping Pain Intervention(s): Limited activity within patient's tolerance, Monitored during session, Repositioned     Hand  Dominance Right   Extremity/Trunk Assessment Upper Extremity Assessment Upper Extremity Assessment: Generalized weakness   Lower Extremity Assessment Lower Extremity Assessment:  Defer to PT evaluation   Cervical / Trunk Assessment Cervical / Trunk Assessment: Other exceptions Cervical / Trunk Exceptions: abdominal guarding   Communication Communication Communication: No difficulties   Cognition Arousal/Alertness: Awake/alert Behavior During Therapy: Flat affect, Anxious Overall Cognitive Status: Impaired/Different from baseline Area of Impairment: Memory, Safety/judgement, Following commands                 Orientation Level: Time   Memory: Decreased short-term memory Following Commands: Follows one step commands with increased time       General Comments: very anxious with cords/mobility     General Comments  none stated    Exercises     Shoulder Instructions      Home Living Family/patient expects to be discharged to:: Private residence Living Arrangements: Spouse/significant other Available Help at Discharge: Family;Other (Comment);Available 24 hours/day Type of Home: Apartment Home Access: Stairs to enter [employer] of Steps: flight Entrance Stairs-Rails: Right;Left Home Layout: One level     Bathroom Shower/Tub: Chief Strategy Officer: Standard     Home Equipment: Cane - single point;Toilet riser          Prior Functioning/Environment Prior Level of Function : Independent/Modified Independent;Driving;Working/employed             Mobility Comments: Pt was independent, driving, works as a city [employer]. ADLs Comments: ind with ADLs        OT Problem List: Decreased strength;Decreased range of motion;Decreased activity tolerance;Impaired balance (sitting and/or standing);Decreased cognition;Decreased safety awareness      OT Treatment/Interventions: Self-care/ADL training;Therapeutic exercise;Energy conservation;DME and/or AE instruction;Therapeutic activities;Patient/family education;Balance training    OT Goals(Current goals can be found in the care plan section) Acute Rehab OT  Goals Patient Stated Goal: none stated OT Goal Formulation: With patient Time For Goal Achievement: 01/19/23 Potential to Achieve Goals: Good ADL Goals Pt Will Perform Upper Body Dressing: with min guard assist;sitting Pt Will Perform Lower Body Dressing: with min guard assist;sitting/lateral leans;sit to/from stand;with adaptive equipment Pt Will Transfer to Toilet: with min guard assist;ambulating;regular height toilet  OT Frequency: Min 1X/week    Co-evaluation              AM-PAC OT "6 Clicks" Daily Activity     Outcome Measure Help from another person eating meals?: Total Help from another person taking care of personal grooming?: A Little Help from another person toileting, which includes using toliet, bedpan, or urinal?: A Lot Help from another person bathing (including washing, rinsing, drying)?: A Lot Help from another person to put on and taking off regular upper body clothing?: A Lot Help from another person to put on and taking off regular lower body clothing?: Total 6 Click Score: 11   End of Session Equipment Utilized During Treatment: Rolling walker (2 wheels);Oxygen Nurse Communication: Mobility status  Activity Tolerance: Patient tolerated treatment well Patient left: in bed;with call bell/phone within reach;with family/visitor present  OT Visit Diagnosis: Unsteadiness on feet (R26.81);Other abnormalities of gait and mobility (R26.89);Muscle weakness (generalized) (M62.81)                Time: 1000-1025 OT Time Calculation (min): 25 min Charges:  OT General Charges $OT Visit: 1 Visit OT Evaluation $OT Eval Moderate Complexity: 1 Mod OT Treatments $Therapeutic Activity: 8-22 mins  Devony Mcgrady K, OTD, OTR/L [employer] Preferred Acute Rehab [phone]  Dalphine Handing 01/05/2023, 10:44 AM

## 2023-01-05 NOTE — Plan of Care (Signed)
  Problem: Education: Goal: Knowledge of General Education information will improve Description: Including pain rating scale, medication(s)/side effects and non-pharmacologic comfort measures Outcome: Progressing   Problem: Health Behavior/Discharge Planning: Goal: Ability to manage health-related needs will improve Outcome: Progressing   Problem: Clinical Measurements: Goal: Ability to maintain clinical measurements within normal limits will improve Outcome: Progressing Goal: Will remain free from infection Outcome: Progressing Goal: Diagnostic test results will improve Outcome: Progressing Goal: Respiratory complications will improve Outcome: Progressing Goal: Cardiovascular complication will be avoided Outcome: Progressing   Problem: Activity: Goal: Risk for activity intolerance will decrease Outcome: Progressing   Problem: Nutrition: Goal: Adequate nutrition will be maintained Outcome: Progressing   Problem: Coping: Goal: Level of anxiety will decrease Outcome: Progressing   Problem: Elimination: Goal: Will not experience complications related to bowel motility Outcome: Progressing Goal: Will not experience complications related to urinary retention Outcome: Progressing   Problem: Pain Managment: Goal: General experience of comfort will improve Outcome: Progressing   Problem: Safety: Goal: Ability to remain free from injury will improve Outcome: Progressing   Problem: Skin Integrity: Goal: Risk for impaired skin integrity will decrease Outcome: Progressing   Problem: Education: Goal: Knowledge of the prescribed therapeutic regimen will improve Outcome: Progressing   Problem: Bowel/Gastric: Goal: Gastrointestinal status for postoperative course will improve Outcome: Progressing   Problem: Cardiac: Goal: Ability to maintain an adequate cardiac output will improve Outcome: Progressing   Problem: Clinical Measurements: Goal: Postoperative complications  will be avoided or minimized Outcome: Progressing   Problem: Respiratory: Goal: Respiratory status will improve Outcome: Progressing   Problem: Skin Integrity: Goal: Demonstration of wound healing without infection will improve Outcome: Progressing   Problem: Urinary Elimination: Goal: Ability to achieve and maintain adequate renal perfusion and functioning will improve Outcome: Progressing

## 2023-01-06 ENCOUNTER — Inpatient Hospital Stay (HOSPITAL_COMMUNITY): Payer: Commercial Managed Care - PPO

## 2023-01-06 LAB — CBC
HCT: 30.5 % — ABNORMAL LOW (ref 36.0–46.0)
Hemoglobin: 9.8 g/dL — ABNORMAL LOW (ref 12.0–15.0)
MCH: 27.8 pg (ref 26.0–34.0)
MCHC: 32.1 g/dL (ref 30.0–36.0)
MCV: 86.4 fL (ref 80.0–100.0)
Platelets: 216 10*3/uL (ref 150–400)
RBC: 3.53 MIL/uL — ABNORMAL LOW (ref 3.87–5.11)
RDW: 13.2 % (ref 11.5–15.5)
WBC: 8.9 10*3/uL (ref 4.0–10.5)
nRBC: 0 % (ref 0.0–0.2)

## 2023-01-06 LAB — BASIC METABOLIC PANEL
Anion gap: 6 (ref 5–15)
BUN: 6 mg/dL (ref 6–20)
CO2: 25 mmol/L (ref 22–32)
Calcium: 8.9 mg/dL (ref 8.9–10.3)
Chloride: 102 mmol/L (ref 98–111)
Creatinine, Ser: 0.72 mg/dL (ref 0.44–1.00)
GFR, Estimated: >60 mL/min (ref >60)
Glucose, Bld: 93 mg/dL (ref 70–99)
Potassium: 3.7 mmol/L (ref 3.5–5.1)
Sodium: 133 mmol/L — ABNORMAL LOW (ref 135–145)

## 2023-01-06 MED ORDER — SODIUM CHLORIDE 0.9 % IV SOLN: INTRAVENOUS | Status: DC

## 2023-01-06 MED ORDER — HYDRALAZINE HCL 20 MG/ML IJ SOLN
10.0000 mg | Freq: Three times a day (TID) | INTRAMUSCULAR | Status: DC | PRN
  Administered 2023-01-06 – 2023-01-10 (×4): 10 mg via INTRAVENOUS
  Filled 2023-01-06 (×4): qty 1

## 2023-01-06 NOTE — Progress Notes (Signed)
Educated pt how to use the IS correctly and frequency of using it. Pt demonstrated using IS correctly.   Lawson Radar, RN

## 2023-01-06 NOTE — Progress Notes (Addendum)
  Progress Note    01/06/2023 7:30 AM 2 Days Post-Op  Subjective:  having muscle spasms. Abdomen tender. Not passing any flatus    Vitals:   01/06/23 0200 01/06/23 0400  BP: (!) 151/77   Pulse:    Resp: 16 18  Temp: 98.4 F (36.9 C)   SpO2: 98% 98%   Physical Exam: Cardiac:  tachy Lungs:  non labored, 3L [place] Incisions:  midline incision with dressings c/d/I. Right thigh saphenectomy incisions are intact and well appearing Extremities:  well perfused and warm with palpable DP Abdomen:  distended, soft, ttp  Neurologic: alert and oriented  CBC    Component Value Date/Time   WBC 7.8 01/05/2023 0353   RBC 3.71 (L) 01/05/2023 0353   HGB 10.4 (L) 01/05/2023 0353   HCT 31.7 (L) 01/05/2023 0353   PLT 265 01/05/2023 0353   MCV 85.4 01/05/2023 0353   MCV 89.5 07/22/2018 1550   MCH 28.0 01/05/2023 0353   MCHC 32.8 01/05/2023 0353   RDW 13.1 01/05/2023 0353   LYMPHSABS 1.7 09/05/2022 1441   MONOABS 0.3 09/05/2022 1441   EOSABS 0.2 09/05/2022 1441   BASOSABS 0.0 09/05/2022 1441    BMET    Component Value Date/Time   NA 138 01/05/2023 0353   NA 141 04/12/2022 1025   K 4.3 01/05/2023 0353   CL 102 01/05/2023 0353   CO2 24 01/05/2023 0353   GLUCOSE 118 (H) 01/05/2023 0353   BUN 8 01/05/2023 0353   BUN 8 04/12/2022 1025   CREATININE 0.78 01/05/2023 0353   CREATININE 0.80 03/13/2015 1628   CALCIUM 9.3 01/05/2023 0353   GFRNONAA >60 01/05/2023 0353   GFRAA >60 09/09/2018 1659    INR    Component Value Date/Time   INR 1.2 01/04/2023 1440    Intake/Output Summary (Last 24 hours) at 01/06/2023 0730 Last data filed at 01/06/2023 0656 Gross per 24 hour  Intake 2034.4 ml  Output 495 ml  Net 1539.4 ml     Assessment/Plan:  57 y.o. female is s/p Retrograde L CIA to SMA vein bypass  2 Days Post-Op   Not passing any flatus. Stomach distended, soft, tender to palpation Continue NG Keep NPO Will restart ASA and statin when taking po Tachy and Tachypneic. [place] 3L   Encourage IS Febrile overnight. Morning labs pending Good UOP Continue PT/OT. OOB to chair  DVT prophylaxis:  sq heparin   Graceann Congress, PA-C [facility] [phone] 01/06/2023 7:30 AM  I have interviewed the patient and examined the patient. I agree with the findings by the PA.  Her abdomen is still fairly quiet.  Will leave NG tube for now.  We will encourage her to get out of bed and start moving some.  She is using her PCA for pain and we need to try her wean her off of this as this too will improve bowel function.  Cari Caraway, MD 8:51 AM

## 2023-01-06 NOTE — Plan of Care (Signed)
POC progressing.  

## 2023-01-06 NOTE — Significant Event (Signed)
Rapid Response Event Note   Reason for Call :  Asked to see pt as second set of eyes for RED MEWS: T-100.7, HR-111, RR-21  Initial Focused Assessment:  Pt lying in bed with eyes open, in no visible distress. She is alert and oriented, c/o ABD pain. Lungs are clear/diminished. ABD large, distended, soft, tender to touch. NGT present to LIWS. NGT placement checked and NGT functioning appropriately.  Skin warm to touch. Pt has PCA morphine for pain control.   T-100.3, HR-110, BP-134/85, RR-20, SpO2-985 on RA.   Interventions:  No RRT interventions needed at this time.  Plan of Care:  Pt has low grade fever. Tachycardia/tachpnea are probably caused by this. Pt does not meet Code Sepsis criteria yet. Continue to monitor pt closely. Please call RRT if further assistance needed.   Event Summary:   MD Notified:  Call Time:0019 Arrival Time:0054 End [id number]  Terrilyn Saver, RN

## 2023-01-07 ENCOUNTER — Inpatient Hospital Stay (HOSPITAL_COMMUNITY): Payer: Commercial Managed Care - PPO

## 2023-01-07 DIAGNOSIS — R079 Chest pain, unspecified: Secondary | ICD-10-CM | POA: Diagnosis not present

## 2023-01-07 LAB — TROPONIN I (HIGH SENSITIVITY)
Troponin I (High Sensitivity): 8 ng/L (ref <18)
Troponin I (High Sensitivity): 7 ng/L (ref <18)

## 2023-01-07 LAB — CBC
HCT: 31.2 % — ABNORMAL LOW (ref 36.0–46.0)
Hemoglobin: 10.1 g/dL — ABNORMAL LOW (ref 12.0–15.0)
MCH: 27.3 pg (ref 26.0–34.0)
MCHC: 32.4 g/dL (ref 30.0–36.0)
MCV: 84.3 fL (ref 80.0–100.0)
Platelets: 230 10*3/uL (ref 150–400)
RBC: 3.7 MIL/uL — ABNORMAL LOW (ref 3.87–5.11)
RDW: 13 % (ref 11.5–15.5)
WBC: 9.3 10*3/uL (ref 4.0–10.5)
nRBC: 0 % (ref 0.0–0.2)

## 2023-01-07 MED ORDER — NITROGLYCERIN 0.4 MG SL SUBL
0.4000 mg | SUBLINGUAL_TABLET | SUBLINGUAL | Status: DC | PRN
  Administered 2023-01-07: 0.4 mg via SUBLINGUAL

## 2023-01-07 MED ORDER — NITROGLYCERIN 0.4 MG SL SUBL
SUBLINGUAL_TABLET | SUBLINGUAL | Status: AC
  Administered 2023-01-07: 0.4 mg
  Filled 2023-01-07: qty 1

## 2023-01-07 NOTE — Consult Note (Addendum)
Cardiology Consultation:   Patient ID: [name] MRN: [id number]; DOB: Jun 26, 1965  Admit date: 01/04/2023 Date of Consult: 01/07/2023  Primary Care Provider: Caffie Damme, MD Primary Cardiologist: Nanetta Batty, MD  Primary Electrophysiologist:  None    Patient Profile:   [name] is a 57 y.o. female with a hx of chronic mesenteric ischemia with occluded SMA stent s/p L CIA to SMA vein (post-op day 3), and CAD s/p DES to Lcx (unknown year, patent in 2019) who is being seen for the evaluation of chest pain at the request of Dr. Edilia Bo.  History of Present Illness:   [name] is a 57 yo F with a pmhx of chronic mesenteric ischemia with occluded SMA stent s/p L CIA to SMA vein (post op day 3) who started to develop chest pain on 7/28 at 11PM.   Pt reports her pain is right sided and radiates up to the right side of her neck (where she currently has an IV line). The pain started after she was given subcutaneous heparin and IV protonix, though she is not sure if these medications caused the pain. She reports the pain is constant. She has been given sublingual nitroglycerin x2 without resolution of chest pain. Patient has a known history of CAD with prior DES to Lcx (year unknown). Dr. Allyson Sabal performed a LHC in 12/2017 which showed widely patent Lcx stent and prox LAD 25% lesion - at that time, given her non-obstructive disease, he believed her pain was non-cardiac.  Today, patient reports that she is unsure if her current pain feels like her anginal equivalent from prior. Of note, she had recent L CIA to SMA vein surgery and is on a morphine PCA, as such, her movement is very limited at this time.  Other than chest pain, patient denies nausea, vomiting, palpitations, LE edema, orthopnea, PND. Her EKG that was taken during chest pain shows NSR with RAE and is stable from her prior EKGs. High-sensitivity troponin was drawn and is 8, repeat is pending.  Past Medical History:   Diagnosis Date   Anemia    Chronic mesenteric ischemia (HCC)    Fibroid    Hyperlipidemia    Hypertension    Neuromuscular disorder (HCC)    Neuropathy   NSTEMI (non-ST elevated myocardial infarction) (HCC) 06/2017   s/p DES to LCX 07/02/17   Sarcoidosis     Past Surgical History:  Procedure Laterality Date   ABDOMINAL AORTIC ANEURYSM REPAIR N/A 01/04/2023   Procedure: OPEN MESENTERIC BYPASS left common iliac to SMA, lysis of adhesions;  Surgeon: Cephus Shelling, MD;  Location: [facility] OR;  Service: Vascular;  Laterality: N/A;   ABDOMINAL AORTOGRAM N/A 07/05/2022   Procedure: ABDOMINAL AORTOGRAM;  Surgeon: Cephus Shelling, MD;  Location: [facility] INVASIVE CV LAB;  Service: Cardiovascular;  Laterality: N/A;   ABDOMINAL HYSTERECTOMY     BREAST BIOPSY Right 03/24/2018   BREAST REDUCTION SURGERY Bilateral 10/17/2020   Procedure: MAMMARY REDUCTION  (BREAST);  Surgeon: Allena Napoleon, MD;  Location: [facility] OR;  Service: Plastics;  Laterality: Bilateral;  2 hours   CESAREAN SECTION  1987   CORONARY/GRAFT ACUTE MI REVASCULARIZATION N/A 06/30/2017   Procedure: Coronary/Graft Acute MI Revascularization;  Surgeon: Runell Gess, MD;  Location: [facility];  Service: Cardiovascular;  Laterality: N/A;   FOREARM FRACTURE SURGERY Right 1977   FRACTURE SURGERY     LAPAROSCOPY N/A 05/04/2022   Procedure: LAPAROSCOPY DIAGNOSTIC;  Surgeon: Axel Filler, MD;  Location: [facility] OR;  Service: General;  Laterality: N/A;   LAPAROTOMY N/A 05/04/2022   Procedure: EXPLORATORY LAPAROTOMY;  Surgeon: Axel Filler, MD;  Location: [facility] OR;  Service: General;  Laterality: N/A;   LEFT HEART CATH AND CORONARY ANGIOGRAPHY N/A 06/30/2017   Procedure: LEFT HEART CATH AND CORONARY ANGIOGRAPHY;  Surgeon: Runell Gess, MD;  Location: [facility];  Service: Cardiovascular;  Laterality: N/A;   LEFT HEART CATH AND CORONARY ANGIOGRAPHY N/A 12/23/2017   Procedure: LEFT HEART CATH AND CORONARY ANGIOGRAPHY;   Surgeon: Runell Gess, MD;  Location: [facility];  Service: Cardiovascular;  Laterality: N/A;   PERIPHERAL VASCULAR INTERVENTION  07/05/2022   Procedure: PERIPHERAL VASCULAR INTERVENTION;  Surgeon: Cephus Shelling, MD;  Location: [facility];  Service: Cardiovascular;;  SMA   PERIPHERAL VASCULAR THROMBECTOMY N/A 07/05/2022   Procedure: PERIPHERAL VASCULAR THROMBECTOMY;  Surgeon: Cephus Shelling, MD;  Location: [facility];  Service: Cardiovascular;  Laterality: N/A;  SMA   TUBAL LIGATION  1990   VEIN HARVEST  01/04/2023   Procedure: VEIN HARVEST RIGHT GREATER SAPHENOUS;  Surgeon: Cephus Shelling, MD;  Location: [facility] OR;  Service: Vascular;;     Home Medications:  Prior to Admission medications   Medication Sig Start Date End Date Taking? Authorizing Provider  apixaban (ELIQUIS) 5 MG TABS tablet Take 1 tablet (5 mg total) by mouth 2 (two) times daily. 07/31/22  Yes Cephus Shelling, MD  aspirin EC 81 MG tablet Take 1 tablet (81 mg total) by mouth daily. Swallow whole. 12/04/19  Yes Runell Gess, MD  atorvastatin (LIPITOR) 40 MG tablet TAKE 1 TABLET(40 MG) BY MOUTH DAILY 12/04/22  Yes Cephus Shelling, MD  clopidogrel (PLAVIX) 75 MG tablet Take 1 tablet (75 mg total) by mouth daily with breakfast. 05/16/22  Yes Clinton Gallant M, PA-C  gabapentin (NEURONTIN) 300 MG capsule Take 1 capsule (300 mg) in the morning and take 2 capsules (600 mg) at bedtime 12/14/22  Yes Antony Madura, MD  metoprolol succinate (TOPROL-XL) 25 MG 24 hr tablet Take 25 mg by mouth daily. 12/01/22  Yes [provider]  thiamine (VITAMIN B-1) 100 MG tablet Take 100 mg by mouth daily.   Yes [provider]  acetaminophen (TYLENOL) 500 MG tablet Take 1,000 mg by mouth every 6 (six) hours as needed for moderate pain.    [provider]    Inpatient Medications: Scheduled Meds:  aspirin EC  81 mg Oral Q0600   Chlorhexidine Gluconate Cloth  6 each Topical  Daily   docusate sodium  100 mg Oral Daily   heparin  5,000 Units Subcutaneous Q8H   morphine   Intravenous Q4H   pantoprazole (PROTONIX) IV  40 mg Intravenous QHS   Continuous Infusions:  sodium chloride     sodium chloride Stopped (01/06/23 0847)   sodium chloride 50 mL/hr at 01/06/23 1654   magnesium sulfate bolus IVPB     methocarbamol (ROBAXIN) IV 110 mL/hr at 01/06/23 1654   PRN Meds: sodium chloride, sodium chloride, acetaminophen **OR** acetaminophen, alum & mag hydroxide-simeth, diphenhydrAMINE **OR** diphenhydrAMINE, guaiFENesin-dextromethorphan, hydrALAZINE, magnesium sulfate bolus IVPB, methocarbamol (ROBAXIN) IV, metoprolol tartrate, morphine injection, naloxone **AND** sodium chloride flush, nitroGLYCERIN, ondansetron, ondansetron (ZOFRAN) IV, mouth rinse, phenol, potassium chloride  Allergies:    Allergies  Allergen Reactions   Penicillins Itching and Rash    Caused rash and blisters   Hydrocodone Hives and Itching    Social History:   Social History  Socioeconomic History   Marital status: Widowed    Spouse name: Not on file   Number of children: 2   Years of education: Not on file   Highest education level: Not on file  Occupational History   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 right   Are you currently employed ?    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 befriend   Who lives with you?    What type of home do you live in: 1 story or 2 story? two       Social Determinants of Health   Financial Resource Strain: Not on file  Food Insecurity: No Food Insecurity (05/04/2022)   Hunger Vital Sign    Worried About Running Out of Food in the Last Year: Never true    Ran Out of Food in the Last Year: Never true  Transportation Needs: No Transportation Needs (05/04/2022)   PRAPARE - Administrator, Civil Service (Medical): No    Lack of Transportation (Non-Medical): No  Physical Activity: Not on file  Stress: Not on file  Social Connections: Not on file  Intimate Partner Violence: Not At Risk (05/04/2022)   Humiliation, Afraid, Rape, and Kick questionnaire    Fear of Current or Ex-Partner: No    Emotionally Abused: No    Physically Abused: No    Sexually Abused: No    Family History:   Family History  Problem Relation Age of Onset   Diabetes Mother    Stroke Mother 74   Hypertension Mother    Hyperlipidemia Father    Stroke Father 76   Hypertension Father    Breast cancer Paternal Grandmother      Review of Systems: [y] = yes, [ ]  = no  12 pt ROS is negative except what is documented in HPI  Physical Exam/Data:   Vitals:   01/06/23 2102 01/06/23 2307 01/07/23 0000 01/07/23 0118  BP: (!) 145/71 (!) 155/69 (!) 143/69   Pulse:      Resp: 18 20 (!) 23 18  Temp: 98.3 F (36.8 C) 99 F (37.2 C)    TempSrc: Oral Oral    SpO2: 92% 100% 99% 99%  Weight:      Height:        Intake/Output Summary (Last 24 hours) at 01/07/2023 0230 Last data filed at 01/06/2023 1654 Gross per 24 hour  Intake 827.84 ml  Output 2150 ml  Net -1322.16 ml   Filed Weights   01/04/23 0543  Weight: 90.7 kg   Body mass index is 33.8 kg/m.  General:  lying in bed, NAD HEENT: normal Neck: no JVD Cardiac:  normal S1, S2; RRR; no murmur  Lungs:  3L [place],  non-labored Abd: midline incision Ext: no edema, right thigh incisions are intact Musculoskeletal:  No deformities, BUE and BLE strength normal and equal Skin: warm and dry  Neuro:  CNs 2-12 intact, no focal abnormalities noted Psych:  Normal affect   EKG:  The EKG was personally reviewed and demonstrates: NSR with RAE  Relevant CV Studies: LHC 2019: Previously placed Prox Cx to Mid Cx stent (unknown type) is widely patent. Prox LAD lesion is 25% stenosed. There is mild left ventricular systolic dysfunction. The left ventricular ejection fraction is 50-55% by visual estimate.  Laboratory Data:  Chemistry Recent Labs  Lab 01/05/23 0353 01/06/23 0935 01/07/23 0110  NA 138 133* 135  K 4.3 3.7 3.6  CL 102 102 103  CO2 24 25 22   GLUCOSE 118* 93 76  BUN 8 6 7   CREATININE 0.78 0.72 0.78  CALCIUM 9.3 8.9 8.8*  GFRNONAA >60 >60 >60  ANIONGAP 12 6 10     Recent Labs  Lab 01/05/23 0353  PROT 6.1*  ALBUMIN 3.6  AST 18  ALT 17  ALKPHOS 105  BILITOT 0.9   Hematology Recent Labs  Lab 01/04/23 1440 01/04/23 1444 01/05/23 0353 01/06/23 0935  WBC 10.8*  --  7.8 8.9  RBC 3.84*  --  3.71* 3.53*  HGB 10.9* 11.6* 10.4* 9.8*  HCT 32.5* 34.0* 31.7* 30.5*  MCV 84.6  --  85.4 86.4  MCH 28.4  --  28.0 27.8  MCHC 33.5  --  32.8 32.1  RDW 12.9  --  13.1 13.2  PLT 253  --  265 216   Cardiac EnzymesNo results for input(s): "TROPONINI" in the last 168 hours. No results for input(s): "TROPIPOC" in the last 168 hours.  BNPNo results for input(s): "BNP", "PROBNP" in the last 168 hours.  DDimer No results for input(s): "DDIMER" in the last 168 hours.  Radiology/Studies:  DG CHEST PORT 1 VIEW  Result Date: 01/06/2023 CLINICAL DATA:  Fever. EXAM: PORTABLE CHEST 1 VIEW COMPARISON:  April 22, 2022. FINDINGS: Stable cardiomediastinal silhouette. Nasogastric tube is seen entering stomach. Right internal jugular catheter is noted with tip in expected position of the SVC. Minimal  bibasilar subsegmental atelectasis is noted. Bony thorax is unremarkable. IMPRESSION: Minimal bibasilar subsegmental atelectasis. Electronically Signed   By: Lupita Raider M.D.   On: 01/06/2023 12:36    Assessment and Plan:  [name] is a 57 y.o. female with a hx of chronic mesenteric ischemia with occluded SMA stent s/p L CIA to SMA vein (post-op day 3), and CAD s/p DES to Lcx (unknown year, patent in 2019) who is being seen for the evaluation of chest pain at the request of Dr. Edilia Bo.  #. Atypical chest pain Patient presents with right sided chest pain that started at 11PM while she was getting subQ heparin. Pain was not improved with SL NTG. She has a remote history of CAD, but most recent LHC in 2019 showed patent stent in Lcx and 25% prox LAD stenosis. Her EKG was reviewed and shows NSR with RAE and is stable from prior EKGs. Her hsTNT is 8-->. IN the setting of atypical chest pain, stable EKGs and negative hsTNT, would not empirically start IV heparin as concern for acute coronary syndrome is low. - will continue to trend troponins - No indication for IV heparin - no indication for aspirin load - Cardiology will sign off  Signed, Willette Alma, MD  [facility] 01/07/2023 2:30 AM

## 2023-01-07 NOTE — Progress Notes (Signed)
Physical Therapy Treatment Patient Details Name: [name] MRN: [id number] DOB: 1965-10-25 Today's Date: 01/07/2023   History of Present Illness 57 yo female admitted 7/26 for mesenteric artery BPG due to chronic mesenteric ischemia with occluded superior mesenteric artery stent. PMhx: CAD, HLD, sarcoidosis, NSTEMI, anemia    PT Comments  Pt seen for mobility progression; however, more limited this session due to earlier bout of chest pain which pt reported that it has not resolved. Cardiology was consulted "Her EKG was reviewed and shows NSR with RAE and is stable from prior EKGs. Her hsTNT is 8-->. IN the setting of atypical chest pain, stable EKGs and negative hsTNT, would not empirically start IV heparin as concern for acute coronary syndrome is low ". However, pt still experiencing chest pain during our session which limited her mobility. She was able to tolerate bed mobility with min A, sit<>stand and stand-pivot transfers with RW and min A for stability, and small side steps at EOB with RW and min guard. Pt's RN present and ready to remove her Foley catheter at end of session. Pt would continue to benefit from skilled physical therapy services at this time while admitted and after d/c to address the below listed limitations in order to improve overall safety and independence with functional mobility.  All VSS throughout.    If plan is discharge home, recommend the following: A little help with walking and/or transfers;A little help with bathing/dressing/bathroom;Assistance with cooking/housework;Assist for transportation;Help with stairs or ramp for entrance   Can travel by private vehicle        Equipment Recommendations  Rolling walker (2 wheels)    Recommendations for Other Services       Precautions / Restrictions Precautions Precautions: Other (comment) Precaution Comments: NGT Restrictions Weight Bearing Restrictions: No     Mobility  Bed Mobility Overal bed  mobility: Needs Assistance Bed Mobility: Supine to Sit, Sit to Supine     Supine to sit: HOB elevated, Min assist Sit to supine: Min assist   General bed mobility comments: increased time and effort, assistance needed for trunk elevation and management of LEs off of and onto bed    Transfers Overall transfer level: Needs assistance Equipment used: Rolling walker (2 wheels) Transfers: Sit to/from Stand, Bed to chair/wheelchair/BSC Sit to Stand: Min assist   Step pivot transfers: Min guard       General transfer comment: min assist to rise from surface with cues for hand placement    Ambulation/Gait Ambulation/Gait assistance: Min guard   Assistive device: Rolling walker (2 wheels)         General Gait Details: pt able to take 5 side steps at EOB with RW with min guard for safety and line management   Stairs             Wheelchair Mobility     Tilt Bed    Modified Rankin (Stroke Patients Only)       Balance Overall balance assessment: Needs assistance Sitting-balance support: Feet supported Sitting balance-Leahy Scale: Good     Standing balance support: During functional activity, Single extremity supported, Bilateral upper extremity supported Standing balance-Leahy Scale: Poor                              Cognition Arousal/Alertness: Awake/alert Behavior During Therapy: Flat affect Overall Cognitive Status: Impaired/Different from baseline Area of Impairment: Memory, Safety/judgement, Following commands  Memory: Decreased short-term memory Following Commands: Follows one step commands with increased time, Follows multi-step commands with increased time Safety/Judgement: Decreased awareness of deficits              Exercises      General Comments        Pertinent Vitals/Pain Pain Assessment Pain Assessment: Faces Faces Pain Scale: Hurts little more Pain Location: abdomen Pain Descriptors /  Indicators: Guarding, Sore Pain Intervention(s): Monitored during session    Home Living                          Prior Function            PT Goals (current goals can now be found in the care plan section) Acute Rehab PT Goals PT Goal Formulation: With patient/family Time For Goal Achievement: 01/19/23 Potential to Achieve Goals: Good Progress towards PT goals: Progressing toward goals    Frequency    Min 1X/week      PT Plan Current plan remains appropriate    Co-evaluation              AM-PAC PT "6 Clicks" Mobility   Outcome Measure  Help needed turning from your back to your side while in a flat bed without using bedrails?: A Little Help needed moving from lying on your back to sitting on the side of a flat bed without using bedrails?: A Little Help needed moving to and from a bed to a chair (including a wheelchair)?: A Little Help needed standing up from a chair using your arms (e.g., wheelchair or bedside chair)?: A Little Help needed to walk in hospital room?: A Little Help needed climbing 3-5 steps with a railing? : A Lot 6 Click Score: 17    End of Session   Activity Tolerance: Patient limited by fatigue;Patient limited by pain Patient left: in bed;with call bell/phone within reach;with nursing/sitter in room;with family/visitor present;Other (comment) (RN in room to remove pt's Foley) Nurse Communication: Mobility status PT Visit Diagnosis: Other abnormalities of gait and mobility (R26.89)     Time: 2778-2423 PT Time Calculation (min) (ACUTE ONLY): 27 min  Charges:    $Therapeutic Activity: 23-37 mins PT General Charges $$ ACUTE PT VISIT: 1 Visit                     [name], DPT  Acute Rehabilitation Services Office [phone]    [name] 01/07/2023, 9:14 AM

## 2023-01-07 NOTE — Progress Notes (Deleted)
Bladder scan showed . Pt's urine output=350. Redone bladder scan after pt urinated showed . Pt expressed would like to give her a time to try first before receiving intermitten cath. Will passed it on for nightshift nurse. Will continue to monitor the pt/  Lawson Radar, RN

## 2023-01-07 NOTE — Progress Notes (Signed)
VASCULAR SURGERY:  Called this morning because the patient developed the sudden onset of substernal chest pain.  Initially this was 8 out of 10 but has improved after a sublingual nitroglycerin.  Her heart rate currently is 99.  Blood pressure 143/69.  Her EKG had some subtle changes.  I spoke with the cardiologist on-call who looked at the EKG and will keep an eye out for the troponin level.  The EKG is not felt to be alarming.  She will follow-up after the troponin levels come back.  She is on oxygen.  Cari Caraway, MD 1:07 AM

## 2023-01-07 NOTE — Progress Notes (Addendum)
Progress Note    01/07/2023 7:47 AM 3 Days Post-Op  Subjective:  still has not passed gas. Chest pain still a 7/10. Abdominal pain slightly better but still very tender    Vitals:   01/07/23 0340 01/07/23 0400  BP: (!) 165/84   Pulse:    Resp: 18 18  Temp: 99.1 F (37.3 C)   SpO2: 100% 100%    Physical Exam: General:  no acute distress, NG tube in place with clear output Cardiac:  tachy Lungs:  nonlabored, on 4L [place] Incisions: midline abdominal incision and right thigh incisions intact and dry Extremities:  RLE warm and well perfused Abdomen:  distended, tender to palpation, soft  CBC    Component Value Date/Time   WBC 8.9 01/06/2023 0935   RBC 3.53 (L) 01/06/2023 0935   HGB 9.8 (L) 01/06/2023 0935   HCT 30.5 (L) 01/06/2023 0935   PLT 216 01/06/2023 0935   MCV 86.4 01/06/2023 0935   MCV 89.5 07/22/2018 1550   MCH 27.8 01/06/2023 0935   MCHC 32.1 01/06/2023 0935   RDW 13.2 01/06/2023 0935   LYMPHSABS 1.7 09/05/2022 1441   MONOABS 0.3 09/05/2022 1441   EOSABS 0.2 09/05/2022 1441   BASOSABS 0.0 09/05/2022 1441    BMET    Component Value Date/Time   NA 135 01/07/2023 0110   NA 141 04/12/2022 1025   K 3.6 01/07/2023 0110   CL 103 01/07/2023 0110   CO2 22 01/07/2023 0110   GLUCOSE 76 01/07/2023 0110   BUN 7 01/07/2023 0110   BUN 8 04/12/2022 1025   CREATININE 0.78 01/07/2023 0110   CREATININE 0.80 03/13/2015 1628   CALCIUM 8.8 (L) 01/07/2023 0110   GFRNONAA >60 01/07/2023 0110   GFRAA >60 09/09/2018 1659    INR    Component Value Date/Time   INR 1.2 01/04/2023 1440     Intake/Output Summary (Last 24 hours) at 01/07/2023 0747 Last data filed at 01/07/2023 0700 Gross per 24 hour  Intake 940.8 ml  Output 3325 ml  Net -2384.2 ml      Assessment/Plan:  57 y.o. female is 3 days post op, s/p: retrograde L CIA to SMA vein bypass   -Developed 8/10 chest pain last night with negative troponins. No obvious source currently and still on PCA with  muscle relaxer. Pain rated at 7/10 this morning -Abdominal pain and tenderness about the same. Still requiring PCA but we can wean as tolerated -Abd distended and tender to palpation but soft. Still has not passed flatus. Will continue NGT and keep NPO -Still tachy and tachypneic, requiring 4L [place]. Encourage IS and wean O2 as tolerated -Temp still around 62F. CBC this morning still pending -DVT prophylaxis:  sq heparin   [name], PA-C [facility] [phone] 01/07/2023 7:47 AM  I have seen and evaluated the patient. I agree with the PA note as documented above.  Postop day 3 status post open retrograde mesenteric bypass from the left iliac to the SMA with saphenous vein.  Left DP palpable.  Having significant pain along her midline laparotomy but otherwise soft without rebound or guarding.  Still has had no flatus with NG in place.  Will check KUB today.  Suspect ileus given extensive lysis of adhesions.  Labs reassuring and white count normal at 9.3.  Discussed out of bed to chair and mobilize today.  Did have episode of chest pain overnight and was seen by cardiology and ruled out for any significant cardiac events.  They  have signed off.  Still using PCA.  On heparin for DVT prophylaxis.  Aspirin and will restart Plavix when having bowel function.  Hgb stable 10.1.  Discussed she needs to use IS for pulmonary function.  Cephus Shelling, MD [facility] [place] Office: [phone]

## 2023-01-07 NOTE — Progress Notes (Signed)
This RN was notified that patient was experiencing 8 of 10 chest pain. EKG ordered vital signs taken and physician notified. Vascular on-call physician has placed orders and will come to the bedside. Will continue to monitor.

## 2023-01-08 MED ORDER — CLOPIDOGREL BISULFATE 75 MG PO TABS
75.0000 mg | ORAL_TABLET | Freq: Every day | ORAL | Status: DC
  Administered 2023-01-08 – 2023-01-11 (×4): 75 mg via ORAL
  Filled 2023-01-08 (×4): qty 1

## 2023-01-08 MED ORDER — LACTATED RINGERS IV SOLN: INTRAVENOUS | Status: DC

## 2023-01-08 NOTE — Progress Notes (Signed)
Physical Therapy Treatment Patient Details Name: [name] MRN: [id number] DOB: 11-12-1965 Today's Date: 01/08/2023   History of Present Illness 57 yo female admitted 7/26 for mesenteric artery BPG due to chronic mesenteric ischemia with occluded superior mesenteric artery stent. PMhx: CAD, HLD, sarcoidosis, NSTEMI, anemia    PT Comments  Pt making steady progress with mobility. Expect as pt's recovery progresses she will continue toward return to baseline in mobility.     If plan is discharge home, recommend the following: A little help with walking and/or transfers;A little help with bathing/dressing/bathroom;Assistance with cooking/housework;Assist for transportation;Help with stairs or ramp for entrance   Can travel by private vehicle        Equipment Recommendations  Rollator (4 wheels) (may not need at DC)    Recommendations for Other Services       Precautions / Restrictions Precautions Precautions: Fall Restrictions Weight Bearing Restrictions: No     Mobility  Bed Mobility               General bed mobility comments: Pt up in chair    Transfers Overall transfer level: Needs assistance Equipment used: Rollator (4 wheels) Transfers: Sit to/from Stand Sit to Stand: Min guard           General transfer comment: Assist for safety and lines. Verbal cues for hand placement    Ambulation/Gait Ambulation/Gait assistance: Min guard Gait Distance (Feet): 200 Feet Assistive device: Rollator (4 wheels) Gait Pattern/deviations: Step-through pattern Gait velocity: decr Gait velocity interpretation: 1.31 - 2.62 ft/sec, indicative of limited community ambulator   General Gait Details: Assist for safety and lines.   Stairs             Wheelchair Mobility     Tilt Bed    Modified Rankin (Stroke Patients Only)       Balance Overall balance assessment: Needs assistance Sitting-balance support: Feet supported Sitting balance-Leahy Scale:  Good     Standing balance support: During functional activity, Single extremity supported, Bilateral upper extremity supported Standing balance-Leahy Scale: Poor                              Cognition Arousal/Alertness: Awake/alert Behavior During Therapy: WFL for tasks assessed/performed Overall Cognitive Status: Within Functional Limits for tasks assessed                                          Exercises      General Comments General comments (skin integrity, edema, etc.): HR 120 with amb. SpO2 96% on RA with amb. Dyspnea 2/4 with amb      Pertinent Vitals/Pain Pain Assessment Pain Assessment: No/denies pain    Home Living                          Prior Function            PT Goals (current goals can now be found in the care plan section) Progress towards PT goals: Progressing toward goals    Frequency    Min 1X/week      PT Plan Current plan remains appropriate    Co-evaluation              AM-PAC PT "6 Clicks" Mobility   Outcome Measure  Help needed turning from your back to your  side while in a flat bed without using bedrails?: A Little Help needed moving from lying on your back to sitting on the side of a flat bed without using bedrails?: A Little Help needed moving to and from a bed to a chair (including a wheelchair)?: A Little Help needed standing up from a chair using your arms (e.g., wheelchair or bedside chair)?: A Little Help needed to walk in hospital room?: A Little Help needed climbing 3-5 steps with a railing? : A Lot 6 Click Score: 17    End of Session   Activity Tolerance: Patient tolerated treatment well Patient left: with call bell/phone within reach;with family/visitor present;in chair;with chair alarm set Nurse Communication: Mobility status PT Visit Diagnosis: Other abnormalities of gait and mobility (R26.89)     Time: 1207-1228 PT Time Calculation (min) (ACUTE ONLY): 21  min  Charges:    $Gait Training: 8-22 mins PT General Charges $$ ACUTE PT VISIT: 1 Visit                     [facility] PT Acute Rehabilitation Services Office [phone]    Angelina Ok [facility] 01/08/2023, 1:21 PM

## 2023-01-08 NOTE — Progress Notes (Signed)
EOS:  Flatus (+) BM (-) Pt hesitant to use PCA for pain, RR, BP, HR elevated when experiencing pain

## 2023-01-08 NOTE — Progress Notes (Signed)
Occupational Therapy Treatment Patient Details Name: [name] MRN: [id number] DOB: [birth year] Today's Date: 01/08/2023   History of present illness 57 yo female admitted 7/26 for mesenteric artery BPG due to chronic mesenteric ischemia with occluded superior mesenteric artery stent. PMhx: CAD, HLD, sarcoidosis, NSTEMI, anemia   OT comments  Pt progressing towards goals this session, needing set up - min A for ADLs, min A for bed mobility and min A for transfers with RW. Pt needing increased time for all ADLs/aspect of mobility, keeps trunk flexed due to abdominal incision. Educated pt on LB AE, pt able to demo use of sock aid and reacher for LB ADLs. Pt presenting with impairments listed below, will follow acutely. Continue to recommend HHOT at d/c.    Recommendations for follow up therapy are one component of a multi-disciplinary discharge planning process, led by the attending physician.  Recommendations may be updated based on patient status, additional functional criteria and insurance authorization.    Assistance Recommended at Discharge Intermittent Supervision/Assistance  Patient can return home with the following  A little help with walking and/or transfers;A lot of help with bathing/dressing/bathroom;Assistance with cooking/housework;Direct supervision/assist for medications management;Direct supervision/assist for financial management;Assist for transportation;Help with stairs or ramp for entrance   Equipment Recommendations  Tub/shower seat    Recommendations for Other Services PT consult    Precautions / Restrictions Precautions Precautions: Fall       Mobility Bed Mobility Overal bed mobility: Needs Assistance Bed Mobility: Sidelying to Sit   Sidelying to sit: Min assist            Transfers Overall transfer level: Needs assistance Equipment used: Rolling walker (2 wheels) Transfers: Sit to/from Stand, Bed to chair/wheelchair/BSC Sit to Stand: Min  assist                 Balance Overall balance assessment: Needs assistance Sitting-balance support: Feet supported Sitting balance-Leahy Scale: Good     Standing balance support: During functional activity, Single extremity supported, Bilateral upper extremity supported Standing balance-Leahy Scale: Poor                             ADL either performed or assessed with clinical judgement   ADL Overall ADL's : Needs assistance/impaired     Grooming: Oral care;Set up;Standing               Lower Body Dressing: Minimal assistance;With adaptive equipment;Sitting/lateral leans;Sit to/from stand   Toilet Transfer: Min guard;Ambulation;Regular Toilet;Rolling walker (2 wheels)   Toileting- Clothing Manipulation and Hygiene: Min guard;Sitting/lateral lean       Functional mobility during ADLs: Min guard;Rolling walker (2 wheels)      Extremity/Trunk Assessment Upper Extremity Assessment Upper Extremity Assessment: Generalized weakness   Lower Extremity Assessment Lower Extremity Assessment: Defer to PT evaluation        Vision   Vision Assessment?: No apparent visual deficits   Perception Perception Perception: Not tested   Praxis Praxis Praxis: Not tested    Cognition Arousal/Alertness: Awake/alert Behavior During Therapy: Flat affect Overall Cognitive Status: Impaired/Different from baseline Area of Impairment: Memory, Safety/judgement, Following commands                 Orientation Level: Time   Memory: Decreased short-term memory Following Commands: Follows one step commands with increased time, Follows multi-step commands with increased time Safety/Judgement: Decreased awareness of deficits     General Comments: very anxious with cords/mobility  Exercises      Shoulder Instructions       General Comments none stated    Pertinent Vitals/ Pain       Pain Assessment Pain Assessment: Faces Pain Score: 3   Faces Pain Scale: Hurts little more Pain Location: abdomen Pain Descriptors / Indicators: Guarding, Sore Pain Intervention(s): Limited activity within patient's tolerance, Monitored during session, Repositioned  Home Living                                          Prior Functioning/Environment              Frequency  Min 1X/week        Progress Toward Goals  OT Goals(current goals can now be found in the care plan section)  Progress towards OT goals: Progressing toward goals  Acute Rehab OT Goals Patient Stated Goal: none stated OT Goal Formulation: With patient Time For Goal Achievement: 01/19/23 Potential to Achieve Goals: Good ADL Goals Pt Will Perform Upper Body Dressing: with min guard assist;sitting Pt Will Perform Lower Body Dressing: with min guard assist;sitting/lateral leans;sit to/from stand;with adaptive equipment Pt Will Transfer to Toilet: with min guard assist;ambulating;regular height toilet  Plan Discharge plan remains appropriate;Frequency remains appropriate    Co-evaluation                 AM-PAC OT "6 Clicks" Daily Activity     Outcome Measure   Help from another person eating meals?: A Little Help from another person taking care of personal grooming?: A Little Help from another person toileting, which includes using toliet, bedpan, or urinal?: A Little Help from another person bathing (including washing, rinsing, drying)?: A Lot Help from another person to put on and taking off regular upper body clothing?: A Little Help from another person to put on and taking off regular lower body clothing?: A Lot 6 Click Score: 16    End of Session Equipment Utilized During Treatment: Rolling walker (2 wheels);Oxygen  OT Visit Diagnosis: Unsteadiness on feet (R26.81);Other abnormalities of gait and mobility (R26.89);Muscle weakness (generalized) (M62.81)   Activity Tolerance Patient tolerated treatment well   Patient Left  in chair;with call bell/phone within reach;with chair alarm set;with family/visitor present   Nurse Communication Mobility status        Time: 2440-1027 OT Time Calculation (min): 45 min  Charges: OT General Charges $OT Visit: 1 Visit OT Treatments $Self Care/Home Management : 38-52 mins  Carver Fila, OTD, OTR/L SecureChat Preferred Acute Rehab [phone]   Carver Fila Koonce 01/08/2023, 12:37 PM

## 2023-01-08 NOTE — Plan of Care (Signed)
  Problem: Health Behavior/Discharge Planning: Goal: Ability to manage health-related needs will improve Outcome: Progressing   Problem: Clinical Measurements: Goal: Ability to maintain clinical measurements within normal limits will improve Outcome: Progressing   Problem: Activity: Goal: Risk for activity intolerance will decrease Outcome: Progressing   Problem: Nutrition: Goal: Adequate nutrition will be maintained Outcome: Progressing   Problem: Elimination: Goal: Will not experience complications related to bowel motility Outcome: Progressing   Problem: Pain Managment: Goal: General experience of comfort will improve Outcome: Progressing   Problem: Cardiac: Goal: Ability to maintain an adequate cardiac output will improve Outcome: Progressing

## 2023-01-08 NOTE — Progress Notes (Addendum)
Progress Note    01/08/2023 7:33 AM 4 Days Post-Op  Subjective:  abdominal pain slightly better this morning. Still tender around incision site. Has passed flatus about 3x overnight. No N/V.    Vitals:   01/08/23 0518 01/08/23 0727  BP:    Pulse:    Resp: 20 (!) 21  Temp:    SpO2: 97% 98%    Physical Exam: General:  NAD, NGT in place with 50cc output yesterday Cardiac:  tachy Lungs:  nonlabored on nasal cannula Incisions:  midline abdominal incision and right thigh incisions well appearing and dry Extremities:  BLE warm and well perfused. L DP is palpable Abdomen:  soft, slightly distended, tender to palpation along incision  CBC    Component Value Date/Time   WBC 9.2 01/08/2023 0405   RBC 3.65 (L) 01/08/2023 0405   HGB 10.0 (L) 01/08/2023 0405   HCT 30.6 (L) 01/08/2023 0405   PLT 259 01/08/2023 0405   MCV 83.8 01/08/2023 0405   MCV 89.5 07/22/2018 1550   MCH 27.4 01/08/2023 0405   MCHC 32.7 01/08/2023 0405   RDW 13.1 01/08/2023 0405   LYMPHSABS 1.7 09/05/2022 1441   MONOABS 0.3 09/05/2022 1441   EOSABS 0.2 09/05/2022 1441   BASOSABS 0.0 09/05/2022 1441    BMET    Component Value Date/Time   NA 136 01/08/2023 0405   NA 141 04/12/2022 1025   K 3.5 01/08/2023 0405   CL 103 01/08/2023 0405   CO2 18 (L) 01/08/2023 0405   GLUCOSE 86 01/08/2023 0405   BUN 8 01/08/2023 0405   BUN 8 04/12/2022 1025   CREATININE 0.73 01/08/2023 0405   CREATININE 0.80 03/13/2015 1628   CALCIUM 9.0 01/08/2023 0405   GFRNONAA >60 01/08/2023 0405   GFRAA >60 09/09/2018 1659    INR    Component Value Date/Time   INR 1.2 01/04/2023 1440     Intake/Output Summary (Last 24 hours) at 01/08/2023 0733 Last data filed at 01/08/2023 0308 Gross per 24 hour  Intake 1092.57 ml  Output 1000 ml  Net 92.57 ml      Assessment/Plan:  57 y.o. female is 4 days post op, s/p: retrograde L CIA to SMA vein bypass    -Abdominal pain slowly improving. Still using her PCA for pain  control -Abdomen is still soft and slightly distended. She is tender along her incision site but it is well appearing -Her NGT had 50cc output in the last 24 hours and she has passed flatus about 3 times overnight. We can discontinue her NGT today and start her on a clear liquid diet. -Still on nasal cannula and I have encouraged the use of IS -Afebrile with Tmax 98.44F yesterday. WBC is normal -DVT prophylaxis:  sq heparin   [name], PA-C [facility] [phone] 01/08/2023 7:33 AM   I have seen and evaluated the patient. I agree with the PA note as documented above.  57 year old female now POD 4 status post retrograde mesenteric bypass from the left common iliac to the SMA for occluded SMA stent.  She looks much better today and pain is improving.  I removed her dressing and midline incision with staples looks great.  Labs reassuring.  Started passing flatus last night.  Will DC NG tube and start clear liquids.  Feels she needs 1 more day with the PCA as this is working for pain control.  Will restart Plavix today with plan for dual antiplatelet therapy.  Out of bed and walk and  work with therapy as discussed with her.  Improving pulmonary toilet now pulling 750 on IS.  Left DP palpable.  Cephus Shelling, MD [facility] [place] Office: [phone]

## 2023-01-09 MED ORDER — POLYETHYLENE GLYCOL 3350 17 G PO PACK
17.0000 g | PACK | Freq: Every day | ORAL | Status: DC
  Administered 2023-01-09 – 2023-01-10 (×2): 17 g via ORAL
  Filled 2023-01-09 (×3): qty 1

## 2023-01-09 MED ORDER — OXYCODONE HCL 5 MG PO TABS: 5.0000 mg | ORAL_TABLET | ORAL | Status: DC | PRN

## 2023-01-09 MED ORDER — POTASSIUM CHLORIDE CRYS ER 20 MEQ PO TBCR
40.0000 meq | EXTENDED_RELEASE_TABLET | ORAL | Status: AC
  Administered 2023-01-09 (×2): 40 meq via ORAL
  Filled 2023-01-09 (×2): qty 2

## 2023-01-09 MED ORDER — MORPHINE SULFATE (PF) 2 MG/ML IV SOLN: 2.0000 mg | INTRAVENOUS | Status: DC | PRN

## 2023-01-09 MED FILL — Sodium Chloride IV Soln 0.9%: INTRAVENOUS | Qty: 1000 | Status: AC

## 2023-01-09 MED FILL — Heparin Sodium (Porcine) Inj 1000 Unit/ML: INTRAMUSCULAR | Qty: 30 | Status: AC

## 2023-01-09 NOTE — Progress Notes (Signed)
Mobility Specialist Progress Note:   01/09/23 1200  Mobility  Activity Ambulated with assistance in hallway  Level of Assistance Contact guard assist, steadying assist  Assistive Device Front wheel walker  Distance Ambulated (ft) 240 ft  Activity Response Tolerated well  Mobility Referral Yes  $Mobility charge 1 Mobility  Mobility Specialist Start Time (ACUTE ONLY) 1150  Mobility Specialist Stop Time (ACUTE ONLY) 1205  Mobility Specialist Time Calculation (min) (ACUTE ONLY) 15 min    Pre Mobility: 94 HR During Mobility: 125 HR Post Mobility:  99 HR  Pt received in bed, agreeable to mobility. Mod I for bed mobility. Asymptomatic throughout. Pt left in bed with call bell and family present.  D'Vante Earlene Plater Mobility Specialist Please contact via Special educational needs teacher or Rehab office at [phone]

## 2023-01-09 NOTE — Progress Notes (Signed)
Physical Therapy Treatment Patient Details Name: [name] MRN: [id number] DOB: 02/08/1966 Today's Date: 01/09/2023   History of Present Illness 57 yo female admitted 7/26 for mesenteric artery BPG due to chronic mesenteric ischemia with occluded superior mesenteric artery stent. PMhx: CAD, HLD, sarcoidosis, NSTEMI, anemia    PT Comments  Pt received sitting in the recliner and agreeable to session. Pt reporting increased fatigue due to recent walk with mobility. Pt able to tolerate gait trial with rollator with supervision for safety. Pt initially standing without AD and demonstrating increased unsteadiness, but no LOB. Pt instructed in seated therex with pt able to demo back. Pt continues to be limited by decreased activity tolerance and DOE. Pt continues to benefit from PT services to progress toward functional mobility goals.     If plan is discharge home, recommend the following: A little help with walking and/or transfers;A little help with bathing/dressing/bathroom;Assistance with cooking/housework;Assist for transportation;Help with stairs or ramp for entrance   Can travel by private vehicle        Equipment Recommendations  Rollator (4 wheels)    Recommendations for Other Services       Precautions / Restrictions Precautions Precautions: Fall     Mobility  Bed Mobility               General bed mobility comments: Pt beginning and ending session in recliner    Transfers Overall transfer level: Needs assistance Equipment used: None Transfers: Sit to/from Stand Sit to Stand: Supervision           General transfer comment: STS from recliner with supervision for safety and no initial UE support with pt demonstrating mild unsteadiness    Ambulation/Gait Ambulation/Gait assistance: Supervision Gait Distance (Feet): 160 Feet Assistive device: Rollator (4 wheels) Gait Pattern/deviations: Step-through pattern, Trunk flexed Gait velocity: decr      General Gait Details: Assist with lines and cues for upright posture       Balance Overall balance assessment: Needs assistance Sitting-balance support: Feet supported Sitting balance-Leahy Scale: Good Sitting balance - Comments: sitting in recliner   Standing balance support: During functional activity, Bilateral upper extremity supported, No upper extremity supported Standing balance-Leahy Scale: Fair Standing balance comment: Pt without AD upon initially standing with mild unsteadiness. Pt demonstrating no unsteadiness with rollator support during ambulation                            Cognition Arousal/Alertness: Awake/alert Behavior During Therapy: WFL for tasks assessed/performed Overall Cognitive Status: Within Functional Limits for tasks assessed                                          Exercises General Exercises - Lower Extremity Quad Sets: AROM, Seated, Both, 5 reps Long Arc Quad: AROM, Seated, Both, 10 reps Hip ABduction/ADduction: AROM, Seated, Both, 5 reps Straight Leg Raises: AROM, Seated, Both, 5 reps Hip Flexion/Marching: AROM, Seated, Both, 10 reps    General Comments        Pertinent Vitals/Pain Pain Assessment Pain Assessment: No/denies pain     PT Goals (current goals can now be found in the care plan section) Acute Rehab PT Goals Patient Stated Goal: return home, watch scary movies PT Goal Formulation: With patient/family Time For Goal Achievement: 01/19/23 Potential to Achieve Goals: Good Progress towards PT goals: Progressing toward goals  Frequency    Min 1X/week      PT Plan Current plan remains appropriate       AM-PAC PT "6 Clicks" Mobility   Outcome Measure  Help needed turning from your back to your side while in a flat bed without using bedrails?: A Little Help needed moving from lying on your back to sitting on the side of a flat bed without using bedrails?: A Little Help needed moving to  and from a bed to a chair (including a wheelchair)?: A Little Help needed standing up from a chair using your arms (e.g., wheelchair or bedside chair)?: A Little Help needed to walk in hospital room?: A Little Help needed climbing 3-5 steps with a railing? : A Lot 6 Click Score: 17    End of Session   Activity Tolerance: Patient tolerated treatment well Patient left: with call bell/phone within reach;with family/visitor present;in chair Nurse Communication: Mobility status PT Visit Diagnosis: Other abnormalities of gait and mobility (R26.89)     Time: 1314-1330 PT Time Calculation (min) (ACUTE ONLY): 16 min  Charges:    $Gait Training: 8-22 mins PT General Charges $$ ACUTE PT VISIT: 1 Visit                     Johny Shock, PTA Acute Rehabilitation Services Secure Chat Preferred  [phone]    Johny Shock 01/09/2023, 1:45 PM

## 2023-01-09 NOTE — Progress Notes (Signed)
Inj morphine 2 ml discarded from PCA witnessed by [facility]

## 2023-01-09 NOTE — Progress Notes (Addendum)
  Progress Note    01/09/2023 7:33 AM 5 Days Post-Op  Subjective:  abdominal pain is improving, feels like she doesn't need her PCA anymore. Has continued to pass flatus but no BM. Denies any N/V.   Vitals:   01/09/23 0011 01/09/23 0420  BP: 134/65   Pulse: 98   Resp: 18 18  Temp: 98.6 F (37 C)   SpO2: 97% 97%    Physical Exam: General:  resting comfortably Cardiac:  tachy in the 90s Lungs:  nonlabored, on nasal cannula Incisions:  midline incision intact and dry. RLE incisions well appearing Extremities:  L DP is palpable Abdomen:  soft, slightly distended, tender around incision  CBC    Component Value Date/Time   WBC 6.5 01/09/2023 0510   RBC 3.27 (L) 01/09/2023 0510   HGB 9.2 (L) 01/09/2023 0510   HCT 27.7 (L) 01/09/2023 0510   PLT 263 01/09/2023 0510   MCV 84.7 01/09/2023 0510   MCV 89.5 07/22/2018 1550   MCH 28.1 01/09/2023 0510   MCHC 33.2 01/09/2023 0510   RDW 13.2 01/09/2023 0510   LYMPHSABS 1.7 09/05/2022 1441   MONOABS 0.3 09/05/2022 1441   EOSABS 0.2 09/05/2022 1441   BASOSABS 0.0 09/05/2022 1441    BMET    Component Value Date/Time   NA 137 01/09/2023 0510   NA 141 04/12/2022 1025   K 3.0 (L) 01/09/2023 0510   CL 102 01/09/2023 0510   CO2 24 01/09/2023 0510   GLUCOSE 106 (H) 01/09/2023 0510   BUN 7 01/09/2023 0510   BUN 8 04/12/2022 1025   CREATININE 0.72 01/09/2023 0510   CREATININE 0.80 03/13/2015 1628   CALCIUM 9.3 01/09/2023 0510   GFRNONAA >60 01/09/2023 0510   GFRAA >60 09/09/2018 1659    INR    Component Value Date/Time   INR 1.2 01/04/2023 1440     Intake/Output Summary (Last 24 hours) at 01/09/2023 0733 Last data filed at 01/09/2023 0700 Gross per 24 hour  Intake 590.08 ml  Output 350 ml  Net 240.08 ml      Assessment/Plan:  57 y.o. female is 5 days post op, s/p: retrograde L CIA to SMA vein bypass    -The patients abdominal pain is improving greatly. She no longer needs her PCA, so I will transition her to PO  pain meds -Her midline and RLE incisions are healing appropriately without signs of infection -Tolerating removal of her NGT yesterday. She has passed more flatus and is tolerating a clear liquid diet without N/V. Will transition her to a full liquid diet today -Still no BM yet, will give daily Miralax to encourage bowels -Labwork is reassuring, WBC is normal and Tmax is 98.25F -Continue ASA and plavix -DVT prophylaxis:  sq heparin   [name], PA-C [facility] [phone] 01/09/2023 7:33 AM   I have seen and evaluated the patient. I agree with the PA note as documented above.  States her abdominal pain is much better since retrograde mesenteric bypass.  Tolerated NG tube removal and clear liquids with no nausea or vomiting.  Having a bowel movement this morning.  Will advance to full liquids.  Start MiraLAX daily for bowel regimen.  D/C PCA.  Labs reassuring.  Aspirin Plavix.  Heparin for DVT prophylaxis.  Cephus Shelling, MD [facility] [place] Office: [phone]

## 2023-01-10 ENCOUNTER — Encounter (HOSPITAL_COMMUNITY): Payer: Self-pay | Admitting: Vascular Surgery

## 2023-01-10 MED ORDER — PANTOPRAZOLE SODIUM 40 MG PO TBEC
40.0000 mg | DELAYED_RELEASE_TABLET | Freq: Every day | ORAL | Status: DC
  Administered 2023-01-11: 40 mg via ORAL
  Filled 2023-01-10: qty 1

## 2023-01-10 MED ORDER — SODIUM CHLORIDE 0.9% FLUSH: 10.0000 mL | INTRAVENOUS | Status: DC | PRN

## 2023-01-10 MED ORDER — SODIUM CHLORIDE 0.9% FLUSH
10.0000 mL | Freq: Two times a day (BID) | INTRAVENOUS | Status: DC
  Administered 2023-01-10: 10 mL
  Administered 2023-01-10: 40 mL
  Administered 2023-01-11: 10 mL

## 2023-01-10 NOTE — TOC Initial Note (Signed)
Transition of Care (TOC) - Initial/Assessment Note  Donn Pierini RN, BSN Transitions of Care Unit 4E- RN Case Manager See Treatment Team for direct phone #   Patient Details  Name: [name] MRN: [id number] Date of Birth: January 14, 1966  Transition of Care [facility]) CM/SW Contact:    Darrold Span, RN Phone Number: 01/10/2023, 3:55 PM  Clinical Narrative:                 Noted HH/DME orders placed.  CM spoke with pt at bedside- pt sitting on side of bed eating some soup.  Discussed orders for DME/HH, pt voiced she does not want RW at this time- declined DME.   Discussed HH as well for PT/OT needs- pt also declined HH referral at this time. List provided for [facility] choice Per CMS guidelines from [email or web address] website with star ratings (copy placed in shadow chart)- explained to pt that she could think on it and should she change her mind- CM could make referral prior to discharge. - Pt voiced she would think about HH.   TOC to follow  Expected Discharge Plan: Home w Home Health Services Barriers to Discharge: Continued Medical Work up   Patient Goals and CMS Choice Patient states their goals for this hospitalization and ongoing recovery are:: return home CMS Medicare.gov Compare Post Acute Care list provided to:: Patient Choice offered to / list presented to : Patient      Expected Discharge Plan and Services   Discharge Planning Services: CM Consult Post Acute Care Choice: Durable Medical Equipment, Home Health Living arrangements for the past 2 months: Single Family Home                 DME Arranged: Walker rolling, Patient refused services DME Agency: NA       HH Arranged: PT, OT, Patient Refused HH          Prior Living Arrangements/Services Living arrangements for the past 2 months: Single Family Home Lives with:: Self, Adult Children Patient language and need for interpreter reviewed:: Yes Do you feel safe going back to the place where you live?:  Yes      Need for Family Participation in Patient Care: Yes (Comment) Care giver support system in place?: Yes (comment)   Criminal Activity/Legal Involvement Pertinent to Current Situation/Hospitalization: No - Comment as needed  Activities of Daily Living Home Assistive Devices/Equipment: Grab bars around toilet, Shower chair with back ADL Screening (condition at time of admission) Patient's cognitive ability adequate to safely complete daily activities?: Yes Is the patient deaf or have difficulty hearing?: No Does the patient have difficulty seeing, even when wearing glasses/contacts?: No Does the patient have difficulty concentrating, remembering, or making decisions?: No Patient able to express need for assistance with ADLs?: Yes Does the patient have difficulty dressing or bathing?: No Independently performs ADLs?: Yes (appropriate for developmental age) Does the patient have difficulty walking or climbing stairs?: No Weakness of Legs: None Weakness of Arms/Hands: None  Permission Sought/Granted                  Emotional Assessment Appearance:: Appears stated age Attitude/Demeanor/Rapport: Guarded Affect (typically observed): Quiet, Withdrawn Orientation: : Oriented to Self, Oriented to Place, Oriented to  Time, Oriented to Situation Alcohol / Substance Use: Not Applicable Psych Involvement: No (comment)  Admission diagnosis:  Mesenteric ischemia [facility]) [K55.9] Patient Active Problem List   Diagnosis Date Noted   Mesenteric ischemia (HCC) 01/04/2023   Chronic mesenteric ischemia (HCC) 12/18/2022  Occlusion of superior mesenteric artery (HCC) 12/18/2022   S/P laparotomy 05/04/2022   Observation after surgery 05/03/2022   Acute mesenteric ischemia (HCC) 05/03/2022   Flu-like symptoms 06/17/2018   Cough 06/17/2018   Lower respiratory infection 06/17/2018   Chest pain, rule out acute myocardial infarction 12/20/2017   CAD (coronary artery disease) 12/20/2017    Lung nodule, multiple 07/04/2017   Post PTCA 07/04/2017   Abnormal EKG    Acute chest pain    Femoral artery hematoma complicating cardiac catheterization 07/02/2017   Hyperlipidemia with target LDL less than 70 07/02/2017   Tobacco abuse 07/02/2017   NSTEMI (non-ST elevated myocardial infarction) [facility])    Knee LCL sprain 03/20/2017   Rectal bleeding 10/18/2016   Special screening for malignant neoplasms, colon 10/18/2016   Vitamin D deficiency 04/16/2015   PCP:  Caffie Damme, MD Pharmacy:   [id number] G[place] Otto, [place] - [address] AT [facility] & [address] [place] [address] Phone: [phone] Fax: [phone]     Social Determinants of Health (SDOH) Social History: SDOH Screenings   Food Insecurity: No Food Insecurity (05/04/2022)  Housing: Low Risk  (05/04/2022)  Transportation Needs: No Transportation Needs (05/04/2022)  Utilities: Not At Risk (05/04/2022)  Depression (PHQ2-9): Low Risk  (07/22/2018)  Tobacco Use: Medium Risk (01/10/2023)   SDOH Interventions:     Readmission Risk Interventions     No data to display

## 2023-01-10 NOTE — Progress Notes (Signed)
Mobility Specialist Progress Note:   01/10/23 1225  Mobility  Activity Ambulated with assistance to bathroom  Level of Assistance Contact guard assist, steadying assist  Assistive Device None  Distance Ambulated (ft) 15 ft  RLE Weight Bearing WBAT  Activity Response Tolerated fair  Mobility Referral Yes  $Mobility charge 1 Mobility  Mobility Specialist Start Time (ACUTE ONLY) 1211  Mobility Specialist Stop Time (ACUTE ONLY) 1222  Mobility Specialist Time Calculation (min) (ACUTE ONLY) 11 min    Pre Mobility: 121 HR  Pt received ambulating in room, requesting to go to BR. Experiencing some diarrhea. Refused further mobility d/t not feeling well. Will f/u as able.  D'Vante Earlene Plater Mobility Specialist Please contact via Special educational needs teacher or Rehab office at [phone]

## 2023-01-10 NOTE — Plan of Care (Signed)
  Problem: Education: Goal: Knowledge of General Education information will improve Description: Including pain rating scale, medication(s)/side effects and non-pharmacologic comfort measures Outcome: Progressing   Problem: Health Behavior/Discharge Planning: Goal: Ability to manage health-related needs will improve Outcome: Progressing   Problem: Clinical Measurements: Goal: Ability to maintain clinical measurements within normal limits will improve Outcome: Progressing Goal: Will remain free from infection Outcome: Progressing Goal: Diagnostic test results will improve Outcome: Progressing Goal: Respiratory complications will improve Outcome: Progressing Goal: Cardiovascular complication will be avoided Outcome: Progressing   Problem: Activity: Goal: Risk for activity intolerance will decrease Outcome: Progressing   Problem: Nutrition: Goal: Adequate nutrition will be maintained Outcome: Progressing   Problem: Coping: Goal: Level of anxiety will decrease Outcome: Progressing   Problem: Elimination: Goal: Will not experience complications related to bowel motility Outcome: Progressing Goal: Will not experience complications related to urinary retention Outcome: Progressing   Problem: Pain Managment: Goal: General experience of comfort will improve Outcome: Progressing   Problem: Safety: Goal: Ability to remain free from injury will improve Outcome: Progressing   Problem: Skin Integrity: Goal: Risk for impaired skin integrity will decrease Outcome: Progressing   Problem: Education: Goal: Knowledge of the prescribed therapeutic regimen will improve Outcome: Progressing   Problem: Bowel/Gastric: Goal: Gastrointestinal status for postoperative course will improve Outcome: Progressing   Problem: Cardiac: Goal: Ability to maintain an adequate cardiac output will improve Outcome: Progressing   Problem: Clinical Measurements: Goal: Postoperative complications  will be avoided or minimized Outcome: Progressing   Problem: Respiratory: Goal: Respiratory status will improve Outcome: Progressing   Problem: Skin Integrity: Goal: Demonstration of wound healing without infection will improve Outcome: Progressing   Problem: Urinary Elimination: Goal: Ability to achieve and maintain adequate renal perfusion and functioning will improve Outcome: Progressing

## 2023-01-10 NOTE — Progress Notes (Signed)
Occupational Therapy Treatment Patient Details Name: [name] MRN: [id number] DOB: 1965-08-01 Today's Date: 01/10/2023   History of present illness 57 yo female admitted 7/26 for mesenteric artery BPG due to chronic mesenteric ischemia with occluded superior mesenteric artery stent. PMhx: CAD, HLD, sarcoidosis, NSTEMI, anemia   OT comments  Pt in bed upon arrival and reluctant to mobilize due to pain in neck and abdomen, but agreeable to participate with OT. Pt sits EOB with no physical assist, stands and walks with Sup, no AD. Reviewed use of A/E for LB ADLs/selfcare, educated pt on use of LH bath sponge and provided A/E handout. OT will continue to follow acutely to maximize level of function and safety   Recommendations for follow up therapy are one component of a multi-disciplinary discharge planning process, led by the attending physician.  Recommendations may be updated based on patient status, additional functional criteria and insurance authorization.    Assistance Recommended at Discharge Intermittent Supervision/Assistance  Patient can return home with the following  A little help with walking and/or transfers;A lot of help with bathing/dressing/bathroom;Assistance with cooking/housework;Direct supervision/assist for medications management;Direct supervision/assist for financial management;Assist for transportation;Help with stairs or ramp for entrance   Equipment Recommendations  Tub/shower seat;Other (comment) (long handle bath sponge, reacher)    Recommendations for Other Services      Precautions / Restrictions Precautions Precautions: Fall Restrictions Weight Bearing Restrictions: Yes RLE Weight Bearing: Weight bearing as tolerated       Mobility Bed Mobility Overal bed mobility: Needs Assistance       Supine to sit: Min guard Sit to supine: Min guard   General bed mobility comments: pt declined sitting in recliner due to pain    Transfers Overall  transfer level: Needs assistance Equipment used: None Transfers: Sit to/from Stand Sit to Stand: Supervision                 Balance Overall balance assessment: Needs assistance Sitting-balance support: Feet supported Sitting balance-Leahy Scale: Good Sitting balance - Comments: sitting EOB   Standing balance support: During functional activity, Bilateral upper extremity supported, No upper extremity supported Standing balance-Leahy Scale: Fair                             ADL either performed or assessed with clinical judgement   ADL Overall ADL's : Needs assistance/impaired                         Toilet Transfer: Supervision/safety;Ambulation   Toileting- Clothing Manipulation and Hygiene: Supervision/safety         General ADL Comments: reviewed use of A/E for LB ADLs/selfcare, educated pt on use of LH bath sponge and provided A/E handout    Extremity/Trunk Assessment Upper Extremity Assessment Upper Extremity Assessment: Generalized weakness   Lower Extremity Assessment Lower Extremity Assessment: Defer to PT evaluation   Cervical / Trunk Assessment Cervical / Trunk Assessment: Other exceptions Cervical / Trunk Exceptions: abdominal guarding    Vision Ability to See in Adequate Light: 0 Adequate Patient Visual Report: No change from baseline     Perception     Praxis      Cognition Arousal/Alertness: Awake/alert Behavior During Therapy: WFL for tasks assessed/performed Overall Cognitive Status: Within Functional Limits for tasks assessed  Exercises      Shoulder Instructions       General Comments      Pertinent Vitals/ Pain       Pain Assessment Pain Assessment: Faces Faces Pain Scale: Hurts little more Pain Location: abdomen Pain Descriptors / Indicators: Guarding, Sore Pain Intervention(s): Monitored during session, Limited activity within patient's  tolerance, Repositioned  Home Living                                          Prior Functioning/Environment              Frequency  Min 1X/week        Progress Toward Goals  OT Goals(current goals can now be found in the care plan section)  Progress towards OT goals: Progressing toward goals     Plan Discharge plan remains appropriate;Frequency remains appropriate    Co-evaluation                 AM-PAC OT "6 Clicks" Daily Activity     Outcome Measure   Help from another person eating meals?: None Help from another person taking care of personal grooming?: A Little Help from another person toileting, which includes using toliet, bedpan, or urinal?: A Little Help from another person bathing (including washing, rinsing, drying)?: A Lot Help from another person to put on and taking off regular upper body clothing?: A Little Help from another person to put on and taking off regular lower body clothing?: A Lot 6 Click Score: 17    End of Session    OT Visit Diagnosis: Unsteadiness on feet (R26.81);Other abnormalities of gait and mobility (R26.89);Muscle weakness (generalized) (M62.81)   Activity Tolerance Patient limited by pain   Patient Left with call bell/phone within reach;in bed (sitting EOB)   Nurse Communication          Time: 4010-2725 OT Time Calculation (min): 19 min  Charges: OT General Charges $OT Visit: 1 Visit OT Treatments $Therapeutic Activity: 8-22 mins    Galen Manila 01/10/2023, 12:41 PM

## 2023-01-10 NOTE — Progress Notes (Signed)
Mobility Specialist Progress Note:   01/10/23 1500  Mobility  Activity Ambulated with assistance in hallway  Level of Assistance Contact guard assist, steadying assist  Assistive Device Four wheel walker  Distance Ambulated (ft) 660 ft  RLE Weight Bearing WBAT  Activity Response Tolerated well  Mobility Referral Yes  $Mobility charge 1 Mobility  Mobility Specialist Start Time (ACUTE ONLY) 1454  Mobility Specialist Stop Time (ACUTE ONLY) 1508  Mobility Specialist Time Calculation (min) (ACUTE ONLY) 14 min    Pre Mobility: 106 HR During Mobility: 130 HR Post Mobility:  101 HR  Pt received in bed, agreeable to mobility. Asymptomatic throughout. Pt left in bed with call bell and family present.  D'Vante Earlene Plater Mobility Specialist Please contact via Special educational needs teacher or Rehab office at [phone]

## 2023-01-10 NOTE — Progress Notes (Addendum)
  Progress Note    01/10/2023 7:59 AM 6 Days Post-Op  Subjective:  feeling tired this morning, did not sleep well. Had small amount of oozing from abdominal incision overnight from tossing and turning. Tolerated full liquids yesterday and had 4 BMs    Vitals:   01/09/23 2329 01/10/23 0409  BP: (!) 143/63 (!) 126/97  Pulse: (!) 101 98  Resp: 20 20  Temp: 98.8 F (37.1 C) 98.7 F (37.1 C)  SpO2: 96% 95%    Physical Exam: General:  resting comfortably Cardiac:  regular Lungs:  nonlabored, on RA Incisions:  midline incision intact with staples, some minimal bloody drainage overnight. Right thigh incisions intact and dry Extremities:  palpable L DP Abdomen:  soft, mildly tender around incision  CBC    Component Value Date/Time   WBC 6.5 01/09/2023 0510   RBC 3.27 (L) 01/09/2023 0510   HGB 9.2 (L) 01/09/2023 0510   HCT 27.7 (L) 01/09/2023 0510   PLT 263 01/09/2023 0510   MCV 84.7 01/09/2023 0510   MCV 89.5 07/22/2018 1550   MCH 28.1 01/09/2023 0510   MCHC 33.2 01/09/2023 0510   RDW 13.2 01/09/2023 0510   LYMPHSABS 1.7 09/05/2022 1441   MONOABS 0.3 09/05/2022 1441   EOSABS 0.2 09/05/2022 1441   BASOSABS 0.0 09/05/2022 1441    BMET    Component Value Date/Time   NA 137 01/09/2023 0510   NA 141 04/12/2022 1025   K 3.0 (L) 01/09/2023 0510   CL 102 01/09/2023 0510   CO2 24 01/09/2023 0510   GLUCOSE 106 (H) 01/09/2023 0510   BUN 7 01/09/2023 0510   BUN 8 04/12/2022 1025   CREATININE 0.72 01/09/2023 0510   CREATININE 0.80 03/13/2015 1628   CALCIUM 9.3 01/09/2023 0510   GFRNONAA >60 01/09/2023 0510   GFRAA >60 09/09/2018 1659    INR    Component Value Date/Time   INR 1.2 01/04/2023 1440     Intake/Output Summary (Last 24 hours) at 01/10/2023 0759 Last data filed at 01/10/2023 0700 Gross per 24 hour  Intake 2307.47 ml  Output --  Net 2307.47 ml      Assessment/Plan:  57 y.o. female is 6 days post op, s/p: retrograde L CIA to SMA vein bypass   -Pain is  well controlled with oral pain meds. PCA was discontinued yesterday -Midline incision is intact and dry this morning. Had some mild bloody drainage overnight since the patient was tossing around all night. Will re-dress with dry bandage today -R thigh incisions intact and dry -Tolerated full liquid diet yesterday without N/V. Had 4 BM yesterday. Will transition to regular diet today -Continue asa, plavix, and subcutaneous heparin. Potentially home tomorrow if tolerating a normal diet and mobilizing well   [name], PA-C [facility] [phone] 01/10/2023 7:59 AM   I have seen and evaluated the patient. I agree with the PA note as documented above.  Postop day 6 status post retrograde mesenteric bypass from the left common iliac artery to SMA for occluded SMA stent.  Looks good.  Tolerated full liquids yesterday.  Will advance to regular diet.  Left DP palpable.  On bowel regimen and had bowel function yesterday.  Doing well since PCA was discontinued.  Discussed getting peripheral IV access and getting a triple-lumen out.  Close to discharge.  Cephus Shelling, MD [facility] [place] Office: [phone]

## 2023-01-11 MED ORDER — FLUCONAZOLE 100 MG PO TABS
100.0000 mg | ORAL_TABLET | Freq: Every day | ORAL | 0 refills | Status: AC
Start: 2023-01-11 — End: 2023-01-14

## 2023-01-11 MED ORDER — OXYCODONE HCL 5 MG PO TABS: 5.0000 mg | ORAL_TABLET | Freq: Four times a day (QID) | ORAL | 0 refills | Status: DC | PRN

## 2023-01-11 NOTE — TOC Transition Note (Signed)
Transition of Care (TOC) - CM/SW Discharge Note Donn Pierini RN, BSN Transitions of Care Unit 4E- RN Case Manager See Treatment Team for direct phone #   Patient Details  Name: [name] MRN: [id number] Date of Birth: 11/29/1965  Transition of Care [facility]) CM/SW Contact:  Darrold Span, RN Phone Number: 01/11/2023, 1:14 PM   Clinical Narrative:    Pt stable for transition home today, received msg that pt now agreeable to [employer] services.   CM to bedside to follow up- per pt she has VM msg from [facility] outpt therapy- pt has referral to outpt therapy and they are trying to reach her to schedule- discussed with pt outpt therapy vs HH- pt voiced she would like HH to start with and will f/u with surgeon on f/u visit about changing to outpt.  Per pt she has used [facility] in past and wants to use them again.  Still declined DME at this time.   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 And get paperwork that she can then take with her to MD office. Pt voiced understanding.   Per pt she has transportation home, verified she is now staying at her apt. - address, phone # and PCP all confirmed in epic.   Call made to [facility] liaison for HHPT/OT referral- referral has been accepted- will anticipated start of care for Monday 8/5.   No further TOC needs noted.    Final next level of care: Home w Home Health Services Barriers to Discharge: Barriers Resolved   Patient Goals and CMS Choice CMS Medicare.gov Compare Post Acute Care list provided to:: Patient Choice offered to / list presented to : Patient  Discharge Placement               Home w/ [facility]          Discharge Plan and Services Additional resources added to the After Visit Summary for     Discharge Planning Services: CM Consult Post Acute Care Choice: Durable Medical Equipment, Home Health          DME Arranged: Walker rolling, Patient refused services DME  Agency: NA       HH Arranged: PT, OT HH Agency: [facility] Date [facility] Agency Contacted: 01/11/23 Time HH Agency Contacted: 1300 Representative spoke with at [facility] Agency: Tresa Endo  Social Determinants of Health (SDOH) Interventions SDOH Screenings   Food Insecurity: No Food Insecurity (05/04/2022)  Housing: Low Risk  (05/04/2022)  Transportation Needs: No Transportation Needs (05/04/2022)  Utilities: Not At Risk (05/04/2022)  Depression (PHQ2-9): Low Risk  (07/22/2018)  Tobacco Use: Medium Risk (01/10/2023)     Readmission Risk Interventions    01/11/2023    1:14 PM  Readmission Risk Prevention Plan  Transportation Screening Complete  PCP or Specialist Appt within 5-7 Days Complete  Home Care Screening Complete  Medication Review (RN CM) Complete

## 2023-01-11 NOTE — Discharge Instructions (Signed)
[facility]  Discharge Instructions   Open Aortic Surgery  Please refer to the following instructions for your post-procedure care. Your surgeon or Physician Assistant will discuss any changes with you.  Activity  Avoid lifting more than eight pounds (a gallon of milk) until after your first post-operative visit. You are encouraged to walk as much as you can. You can slowly return to normal activities but must avoid strenuous activity and heavy lifting until your doctor tells you it's okay. Heavy lifting can hurt the incision and cause a hernia. Avoid activities such as vacuuming or swinging a golf club. It is normal to feel tired for several weeks after your surgery. Do not drive until your doctor gives the okay and you are no longer taking prescription pain medications. It is also normal to have difficulty with sleep habits, eating and bowl movements after surgery. These will go away with time.  Bathing/Showering  Shower daily after you go home. Do not soak in a bathtub, hot tub, or swim until the incision heals.  Incision Care  Shower every day. Clean your incision with mild soap and water. Pat the area dry with a clean towel. You do not need a bandage unless otherwise instructed. Do not apply any ointments or creams to your incision. You may have skin glue on your incision. Do not peel it off. It will come off on its own in about one week. If you have staples or sutures along your incision, they will be removed at your post op appointment.   Diet  Resume your normal diet. There are no special food restriction following this procedure. A low fat/low cholesterol diet is recommended for all patients with vascular disease. After your aortic surgery, it's normal to feel full faster than usual and to not feel as hungry as you normally would. You will probably lose weight initially following your surgery. It's best to eat small, frequent meals over the course  of the day. Call the office if you find that you are unable to eat even small meals.   In order to heal from your surgery, it is CRITICAL to get adequate nutrition. Your body requires vitamins, minerals, and protein. Vegetables are the best source of vitamins and minerals. If you have pain, you may take over-the-counter pain reliever such as acetaminophen (Tylenol). If you were prescribed a stronger pain medication, please be aware these medication can cause nausea and constipation. Prevent nausea by taking the medication with a snack or meal. Avoid constipation by drinking plenty of fluids and eating foods with a high amount of fiber, such as fruits, vegetables and grains. Take 100mg  of the over-the-counter stool softener Colace twice a day as needed to help with constipation. A laxative, such as Milk of Magnesia, may be recommended for you at this time. Do not take a laxative unless your surgeon or P.A. tells you it's OK.  Do not take Tylenol if you are taking stronger pain medications (such as Percocet).  Follow Up  Our office will schedule a follow up appointment 3 weeks after discharge.  Please call us immediately for any of the following conditions    .     Severe or worsening pain in your legs or feet or in your abdomen back or chest. Increased pain, redness drainage (pus) from your incision site. Increased abdominal pain, bloating, nausea, vomiting, or persistent diarrhea. Fever of 101 degrees or higher. Swelling in your leg (s).  Reduce your risk of  vascular disease  Stop smoking. If you would like help, call [facility] at 1-800-QUIT-NOW (618 453 1741) or [place] at 267-329-2229. Manage your cholesterol Maintain a desired weight Control your diabetes Keep your blood pressure down  If you have any questions please call the office at [phone].

## 2023-01-11 NOTE — Progress Notes (Addendum)
  Progress Note    01/11/2023 8:00 AM 7 Days Post-Op  Subjective: Tolerated a normal diet yesterday and had 1 BM    Vitals:   01/11/23 0147 01/11/23 0413  BP:  (!) 141/70  Pulse:  (!) 106  Resp: (!) 22 (!) 26  Temp:  98.6 F (37 C)  SpO2:  98%    Physical Exam: General: Resting comfortably Cardiac: Regular Lungs: Nonlabored Incisions: Midline incision intact with staples and dry.  Right thigh incisions c/d/l Extremities: Palpable left DP Abdomen: Soft, nondistended, mildly tender around incision  CBC    Component Value Date/Time   WBC 6.5 01/09/2023 0510   RBC 3.27 (L) 01/09/2023 0510   HGB 9.2 (L) 01/09/2023 0510   HCT 27.7 (L) 01/09/2023 0510   PLT 263 01/09/2023 0510   MCV 84.7 01/09/2023 0510   MCV 89.5 07/22/2018 1550   MCH 28.1 01/09/2023 0510   MCHC 33.2 01/09/2023 0510   RDW 13.2 01/09/2023 0510   LYMPHSABS 1.7 09/05/2022 1441   MONOABS 0.3 09/05/2022 1441   EOSABS 0.2 09/05/2022 1441   BASOSABS 0.0 09/05/2022 1441    BMET    Component Value Date/Time   NA 136 01/11/2023 0530   NA 141 04/12/2022 1025   K 3.5 01/11/2023 0530   CL 105 01/11/2023 0530   CO2 22 01/11/2023 0530   GLUCOSE 101 (H) 01/11/2023 0530   BUN <5 (L) 01/11/2023 0530   BUN 8 04/12/2022 1025   CREATININE 0.70 01/11/2023 0530   CREATININE 0.80 03/13/2015 1628   CALCIUM 9.5 01/11/2023 0530   GFRNONAA >60 01/11/2023 0530   GFRAA >60 09/09/2018 1659    INR    Component Value Date/Time   INR 1.2 01/04/2023 1440     Intake/Output Summary (Last 24 hours) at 01/11/2023 0800 Last data filed at 01/11/2023 0600 Gross per 24 hour  Intake 1844 ml  Output --  Net 1844 ml      Assessment/Plan:  57 y.o. female is 7 days postop, s/p: Retrograde left CIA to SMA vein bypass  -Her level of pain continues to improve and is controlled with oral pain meds -Her midline incision and right thigh incisions are well-appearing with no erythema -She tolerated a normal diet yesterday without  nausea or vomiting.  She also has 1 BM -She is mobilizing well and PT/OT has recommended HHPT/OT.  This morning upon speaking with the patient she is open to home health services.  We will arrange these prior to discharge -She will continue aspirin, Plavix, and statin.  Will discharge home today with an incision check in 3-4 weeks.    [name], PA-C [facility] [phone] 01/11/2023 8:00 AM   I have seen and evaluated the patient. I agree with the PA note as documented above.  Postop day 7 status post retrograde left common iliac artery to SMA bypass for occluded SMA stent.  Looks great.  Tolerated regular diet yesterday for breakfast lunch and dinner.  Had a bowel movement.  No further abdominal pain after eating since mesenteric bypass.  Plan discharge today and patient feels ready to go.  Discussed aspirin Plavix at discharge.  Will not resume Eliquis - this was for previously occluded SMA stent.  Will arrange follow-up in about 3 weeks with staple removal.  Discussed she call with questions or concerns.  Overall looks good.  Cephus Shelling, MD [facility] [place] Office: [phone]

## 2023-01-11 NOTE — Progress Notes (Signed)
Physical Therapy Treatment Patient Details Name: [name] MRN: [id number] DOB: [birth year] Today's Date: 01/11/2023   History of Present Illness 57 yo female admitted 7/26 for mesenteric artery BPG due to chronic mesenteric ischemia with occluded superior mesenteric artery stent. PMhx: CAD, HLD, sarcoidosis, NSTEMI, anemia    PT Comments  Pt received in supine and agreeable to session. Pt continues to require min A to perform bed mobility due to pain. Pt able to stand and tolerate gait trial without AD with up to min guard for safety. Pt demonstrates some unsteadiness without AD, but no overt LOB. Pt declines DME for home and states that her boyfriend is home all day and works at night. Pt educated on benefits of HHPT and is now agreeable to services. Pt continues to benefit from PT services to progress toward functional mobility goals.    If plan is discharge home, recommend the following: A little help with walking and/or transfers;A little help with bathing/dressing/bathroom;Assistance with cooking/housework;Assist for transportation;Help with stairs or ramp for entrance   Can travel by private vehicle        Equipment Recommendations  Rollator (4 wheels)    Recommendations for Other Services       Precautions / Restrictions Precautions Precautions: Fall     Mobility  Bed Mobility Overal bed mobility: Needs Assistance Bed Mobility: Sidelying to Sit   Sidelying to sit: Min assist, HOB elevated       General bed mobility comments: Min A for trunk elevation    Transfers Overall transfer level: Needs assistance Equipment used: None Transfers: Sit to/from Stand Sit to Stand: Supervision           General transfer comment: From low EOB with supervision for safety    Ambulation/Gait Ambulation/Gait assistance: Min guard Gait Distance (Feet): 260 Feet Assistive device: None Gait Pattern/deviations: Step-through pattern, Trunk flexed       General Gait  Details: Pt initially using IV pole progressing to no AD with mild unsteadiness, but no overt LOB. Min guard for safety due to intermittent drifting. Trunk flexed to guard abdomen due to pain   Stairs Stairs: Yes Stairs assistance: Min guard Stair Management: Two rails, Forwards Number of Stairs: 4 General stair comments: Pt able to perform stair trial with min guard for safety and no overt unsteadiness with B handrail support. pt limited by fatigue       Balance Overall balance assessment: Needs assistance Sitting-balance support: Feet supported Sitting balance-Leahy Scale: Good Sitting balance - Comments: sitting EOB   Standing balance support: During functional activity, No upper extremity supported Standing balance-Leahy Scale: Fair Standing balance comment: without AD with mild unsteadiness requiring min guard for safety                            Cognition Arousal/Alertness: Awake/alert Behavior During Therapy: Flat affect Overall Cognitive Status: Within Functional Limits for tasks assessed                                          Exercises      General Comments        Pertinent Vitals/Pain Pain Assessment Pain Assessment: 0-10 Pain Score: 7  Pain Location: abdomen with mobility Pain Descriptors / Indicators: Guarding, Sore Pain Intervention(s): Monitored during session, Repositioned     PT Goals (current goals can now be  found in the care plan section) Acute Rehab PT Goals Patient Stated Goal: return home, watch scary movies PT Goal Formulation: With patient/family Time For Goal Achievement: 01/19/23 Potential to Achieve Goals: Good Progress towards PT goals: Progressing toward goals    Frequency    Min 1X/week      PT Plan Current plan remains appropriate       AM-PAC PT "6 Clicks" Mobility   Outcome Measure  Help needed turning from your back to your side while in a flat bed without using bedrails?: A  Little Help needed moving from lying on your back to sitting on the side of a flat bed without using bedrails?: A Little Help needed moving to and from a bed to a chair (including a wheelchair)?: A Little Help needed standing up from a chair using your arms (e.g., wheelchair or bedside chair)?: A Little Help needed to walk in hospital room?: A Little Help needed climbing 3-5 steps with a railing? : A Little 6 Click Score: 18    End of Session Equipment Utilized During Treatment: Gait belt Activity Tolerance: Patient tolerated treatment well Patient left: with call bell/phone within reach;in chair Nurse Communication: Mobility status PT Visit Diagnosis: Other abnormalities of gait and mobility (R26.89)     Time: 1610-9604 PT Time Calculation (min) (ACUTE ONLY): 17 min  Charges:    $Gait Training: 8-22 mins PT General Charges $$ ACUTE PT VISIT: 1 Visit                     Johny Shock, PTA Acute Rehabilitation Services Secure Chat Preferred  [phone]    Johny Shock 01/11/2023, 11:05 AM

## 2023-01-11 NOTE — Progress Notes (Signed)
Pt's MEWS score was red due to her HR 140-158 sinus tachycardia at ambulation to the bathroom with a complaint of feeling shortness of breath SPO2 95-98% on room air. RR 22-26, BP 149/78 mmHg. We suspected from Hydralazine administered  for hypertension when BP 170/92 mmHg earlier. At rest her HR is 80s-110. Her pain is well tolerated. She refused any narcotics and only requested Robaxin IV PRN. Able to rest well. We continue to monitor.  Filiberto Pinks, RN

## 2023-01-14 NOTE — Discharge Summary (Signed)
[facility] Discharge Summary    [name] 08-05-1965 57 y.o. female  [id number]  Admission Date: 01/04/2023  Discharge Date: 01/11/2023  Physician: Sherald Hess, MD  Admission Diagnosis: Mesenteric ischemia [facility]) [K55.9]  Discharge Day Diagnosis: Mesenteric ischemia [facility]) [K55.9]   Hospital Course:  The patient was admitted to the hospital and taken to the operating room on 01/04/2023 and underwent:  1) lysis of abdominal adhesions, 2) harvest of right leg greater saphenous vein, and 3) retrograde left common iliac artery to superior mesenteric artery bypass with greater saphenous vein. The pt tolerated the procedure well and was transported to the PACU in fair condition.   POD 1- the patient remained in the ICU. Her abdominal sounds were quiet and she had not passed flatus, so her NGT was kept in and she remained NPO. She was having some muscle spasms in her back, so she was given Robaxin and tolerated this well. She was on supplemental O2 and oxygenating well. She had good urine output and Scr was stable.  POD 2- she was transferred to a step down floor. Her stomach was still distended but soft, and she had not passed any flatus.  Her NG tube was kept in and she remained NPO.  She was febrile overnight with Tmax 100F.  White blood cell count was normal.  Her incisions were well-appearing.  She was weaning off her PCA.  POD 3- she had sudden onset substernal chest pain overnight that improved with sublingual nitroglycerin.  She was hemodynamically stable.  Cardiology was consulted, EKG was obtained, and troponins were drawn.  Her EKG had no alarming changes and her troponins were negative.  It was felt her chest pain was possibly musculoskeletal.  She still had not passed flatus.  Her NG tube was kept in place and she remained NPO.  KUB demonstrated scattered mild distention of bowel without obvious obstruction.  She was no longer febrile and WBC remained normal.  She began to  mobilize with PT/OT more.   POD 4-she finally passed flatus and had more active bowel sounds.  Her NG tube was removed and she started a clear liquid diet.  Her chest pain was also greatly improved.  She was almost completely off her PCA.  She was started on daily oral aspirin and Plavix.  Her pulmonary toilet was improving, pulling 750 on IS.  She was mobilizing well with PT/OT and they recommended HHPT/OT services upon discharge.  POD 5- Continued to pass flatus, without nausea/vomiting.  Had 1 bowel movement in the morning.  Tolerated a clear liquid diet without abdominal pain, so she was progressed to full liquid diet. WBC remained normal. PCA was discontinued due to improved pain.  POD 6- Tolerated a full liquid diet without abdominal pain. Had 4 bowel movements.  Incisions were well-appearing scant serous drainage from abdominal incision.  She was continued on aspirin and Plavix.  She was transitioned to a regular diet. Labwork was stable  POD 7- She tolerated a regular diet without abdominal pain, nausea, or vomiting.  She had another bowel movement.  She continued to tolerate aspirin and Plavix.  She was mobilizing well and HHPT/OT services were arranged.  Her incisions were well-appearing dry.  She was not going to continue her Eliquis.  She was discharged home on POD 7.  CBC    Component Value Date/Time   WBC 6.5 01/09/2023 0510   RBC 3.27 (L) 01/09/2023 0510   HGB 9.2 (L) 01/09/2023 0510   HCT 27.7 (L)  01/09/2023 0510   PLT 263 01/09/2023 0510   MCV 84.7 01/09/2023 0510   MCV 89.5 07/22/2018 1550   MCH 28.1 01/09/2023 0510   MCHC 33.2 01/09/2023 0510   RDW 13.2 01/09/2023 0510   LYMPHSABS 1.7 09/05/2022 1441   MONOABS 0.3 09/05/2022 1441   EOSABS 0.2 09/05/2022 1441   BASOSABS 0.0 09/05/2022 1441    BMET    Component Value Date/Time   NA 136 01/11/2023 0530   NA 141 04/12/2022 1025   K 3.5 01/11/2023 0530   CL 105 01/11/2023 0530   CO2 22 01/11/2023 0530   GLUCOSE  101 (H) 01/11/2023 0530   BUN <5 (L) 01/11/2023 0530   BUN 8 04/12/2022 1025   CREATININE 0.70 01/11/2023 0530   CREATININE 0.80 03/13/2015 1628   CALCIUM 9.5 01/11/2023 0530   GFRNONAA >60 01/11/2023 0530   GFRAA >60 09/09/2018 1659     Discharge Instructions     Call MD for:  redness, tenderness, or signs of infection (pain, swelling, redness, odor or green/yellow discharge around incision site)   Complete by: As directed    Call MD for:  severe uncontrolled pain   Complete by: As directed    Call MD for:  temperature >100.4   Complete by: As directed    Diet - low sodium heart healthy   Complete by: As directed    Discharge wound care:   Complete by: As directed    Wash your incisions daily with antibacterial soap and water, then pat dry. Do not soak your incisions in water or take any baths until you are cleared by your doctor.   Increase activity slowly   Complete by: As directed        Discharge Diagnosis:  Mesenteric ischemia (HCC) [K55.9]  Secondary Diagnosis: Patient Active Problem List   Diagnosis Date Noted   Mesenteric ischemia (HCC) 01/04/2023   Chronic mesenteric ischemia (HCC) 12/18/2022   Occlusion of superior mesenteric artery (HCC) 12/18/2022   S/P laparotomy 05/04/2022   Observation after surgery 05/03/2022   Acute mesenteric ischemia (HCC) 05/03/2022   Flu-like symptoms 06/17/2018   Cough 06/17/2018   Lower respiratory infection 06/17/2018   Chest pain, rule out acute myocardial infarction 12/20/2017   CAD (coronary artery disease) 12/20/2017   Lung nodule, multiple 07/04/2017   Post PTCA 07/04/2017   Abnormal EKG    Acute chest pain    Femoral artery hematoma complicating cardiac catheterization 07/02/2017   Hyperlipidemia with target LDL less than 70 07/02/2017   Tobacco abuse 07/02/2017   NSTEMI (non-ST elevated myocardial infarction) (HCC)    Knee LCL sprain 03/20/2017   Rectal bleeding 10/18/2016   Special screening for malignant  neoplasms, colon 10/18/2016   Vitamin D deficiency 04/16/2015   Past Medical History:  Diagnosis Date   Anemia    Chronic mesenteric ischemia (HCC)    Fibroid    Hyperlipidemia    Hypertension    Neuromuscular disorder (HCC)    Neuropathy   NSTEMI (non-ST elevated myocardial infarction) (HCC) 06/2017   s/p DES to LCX 07/02/17   Sarcoidosis      Allergies as of 01/11/2023       Reactions   Penicillins Itching, Rash   Caused rash and blisters   Hydrocodone Hives, Itching        Medication List     STOP taking these medications    apixaban 5 MG Tabs tablet Commonly known as: ELIQUIS       TAKE these medications  acetaminophen 500 MG tablet Commonly known as: TYLENOL Take 1,000 mg by mouth every 6 (six) hours as needed for moderate pain.   aspirin EC 81 MG tablet Take 1 tablet (81 mg total) by mouth daily. Swallow whole.   atorvastatin 40 MG tablet Commonly known as: LIPITOR TAKE 1 TABLET(40 MG) BY MOUTH DAILY   clopidogrel 75 MG tablet Commonly known as: PLAVIX Take 1 tablet (75 mg total) by mouth daily with breakfast.   fluconazole 100 MG tablet Commonly known as: Diflucan Take 1 tablet (100 mg total) by mouth daily for 3 doses.   gabapentin 300 MG capsule Commonly known as: NEURONTIN Take 1 capsule (300 mg) in the morning and take 2 capsules (600 mg) at bedtime   metoprolol succinate 25 MG 24 hr tablet Commonly known as: TOPROL-XL Take 25 mg by mouth daily.   oxyCODONE 5 MG immediate release tablet Commonly known as: Oxy IR/ROXICODONE Take 1-2 tablets (5-10 mg total) by mouth every 6 (six) hours as needed for moderate pain or severe pain.   thiamine 100 MG tablet Commonly known as: Vitamin B-1 Take 100 mg by mouth daily.               Discharge Care Instructions  (From admission, onward)           Start     Ordered   01/11/23 0000  Discharge wound care:       Comments: Wash your incisions daily with antibacterial soap and  water, then pat dry. Do not soak your incisions in water or take any baths until you are cleared by your doctor.   01/11/23 1009            Instructions:  [facility] Discharge Instructions Open Aortic Surgery  Please refer to the following instructions for your post-procedure care. Your surgeon or Physician Assistant will discuss any changes with you.  Activity  Avoid lifting more than eight pounds (a gallon of milk) until after your first post-operative visit. You are encouraged to walk as much as you can. You can slowly return to normal activities but must avoid strenuous activity and heavy lifting until your doctor tells you it's OK. Heavy lifting can hurt the incision and cause a hernia. Avoid activities such as vacuuming or swinging a golf club. It is normal to feel tired for several weeks after your surgery. Do not drive until your doctor gives the OK and you are no longer taking prescription pain medications. It is also normal to have difficulty with sleep habits, eating and bowl movements after surgery. These will go away with time.  Bathing/Showering  You may shower after you go home. Do not soak in a bathtub, hot tub, or swim until the incision heals.  Incision Care  Shower every day. Clean your incision with mild soap and water. Pat the area dry with a clean towel. You do not need a bandage unless otherwise instructed. Do not apply any ointments or creams to your incision. You may have skin glue on your incision. Do not peel it off. It will come off on its own in about one week. If you have staples or sutures along your incision, they will be removed at your post op appointment.  If you have groin incisions, wash the groin wounds with soap and water daily and pat dry. (No tub bath-only shower)  Then put a dry gauze or washcloth in the groin to keep this area dry to help prevent  wound infection.  Do this daily and as needed.  Do not use Vaseline  or neosporin on your incisions.  Only use soap and water on your incisions and then protect and keep dry.  Diet  Resume your normal diet. There are no special food restriction following this procedure. A low fat/low cholesterol diet is recommended for all patients with vascular disease. After your aortic surgery, it's normal to feel full faster than usual and to not feel as hungry as you normally would. You will probably lose weight initially following your surgery. It's best to eat small, frequent meals over the course of the day. Call the office if you find that you are unable to eat even small meals. In order to heal from your surgery, it is CRITICAL to get adequate nutrition. Your body requires vitamins, minerals, and protein. Vegetables are the best source of vitamins and minerals. causing pain, you may take over-the-counter pain reliever such as acetaminophen (Tylenol). If you were prescribed a stronger pain medication, please be aware these medication can cause nausea and constipation. Prevent nausea by taking the medication with a snack or meal. Avoid constipation by drinking plenty of fluids and eating foods with a high amount of fiber, such as fruits, vegetables and grains. Take 100mg  of the over-the-counter stool softener Colace twice a day as needed to help with constipation. A laxative, such as Milk of Magnesia, may be recommended for you at this time. Do not take a laxative unless your surgeon or Physician Assistant. tells you it's OK. Do not take Tylenol if you are taking stronger pain medications (such as Percocet).  Follow Up  Our office will schedule a follow up appointment 2-3 weeks after discharge.  Please call us immediately for any of the following conditions   - Severe or worsening pain in your legs or feet or in your abdomen back or chest. - Increased pain, redness drainage (pus) from your incision site - Increased abdominal pain, bloating, nausea, vomiting, or persistent  diarrhea. - Fever of 101 degrees or higher. - Swelling in your leg (s).  Reduce your risk of vascular disease  Stop smoking. If you would like help, call [facility] at 1-800-QUIT-NOW ((786) 822-3285) or [place] at (682)732-0464. Manage your cholesterol Maintain a desired weight Control your diabetes Keep your blood pressure down  If you have any questions please call the office at [phone].  Disposition: Home  Patient's condition: is Good  Follow up: 1. Dr. Chestine Spore in 3 weeks for staple removal   [name], PA-C [facility] [phone] 01/14/2023  3:55 PM   - For VQI Registry use -  Post-op:  Time to Extubation: [x]  In OR, [ ]  < 12 hrs, [ ]  12-24 hrs, [ ]  >=24 hrs Vasopressors Req. Post-op: No ICU Stay: 1 day  Transfusion: No   If yes,  units given MI: No, [ ]  Troponin only, [ ]  EKG or Clinical New Arrhythmia: No  Complications: CHF: No Resp failure: No, [ ]  Pneumonia, [ ]  Ventilator Chg in renal function: No, [ ]  Inc. Cr > 0.5, [ ]  Temp. Dialysis, [ ]  Permanent dialysis Leg ischemia: No, no Surgery needed, [ ]  Yes, Surgery needed, [ ]  Amputation Bowel ischemia: No, [ ]  Medical Rx, [ ]  Surgical Rx Wound complication: No, [ ]  Superficial separation/infection, [ ]  Return to OR Return to OR: No  Return to OR for bleeding: No Stroke: No, [ ]  Minor, [ ]  Major  Discharge medications: Statin use:  Yes  If No:   ASA use:  Yes  If No:   Plavix use:  Yes  Beta blocker use:  Yes  ACEI use:  No ARB use:  No CCB use:  No Coumadin use:  No

## 2023-01-24 NOTE — Progress Notes (Signed)
Patient name: [name] MRN: [id number] DOB: 12-Dec-1965 Sex: female  REASON FOR CONSULT: Postop after open retrograde mesenteric bypass  HPI: [name] is a 57 y.o. female, that presents for postop check after direct open retrograde revascularization for chronic mesenteric ischemia with occluded SMA stent.  On 01/04/2023 she underwent retrograde left common iliac artery to superior mesenteric artery bypass using great saphenous vein.  She did well postoperatively and was discharged.  Today she states she has pain along her midline where the staples are.  Overall her abdominal pain is much better since mesenteric bypass.  Able to eat and drink and having bowel function.  The pain medicine oxycodone does make her nauseated.  She initially underwent percutaneous mechanical thrombectomy of the SMA with angioplasty and stenting on 05/03/2022 for acute mesenteric ischemia with SMA thrombus.  She did require ex lap with all viable bowel but had evidence of hemoperitoneum with no bleeding source identified by general surgery. She had a slow hospital course but ultimately was discharged home on aspirin and Plavix.     I subsequently sent her for CTA abdomen pelvis given her slow clinical progress.  Her CTA 06/23/22 showed recurrent thrombus in the SMA stent with associated thrombus in her visceral aorta as demonstrated on previous imaging.  I started her on Eliquis.  I took her back to the Cath Lab on 07/05/2022 for percutaneous thrombectomy of the SMA stent and extension of the SMA stent into the abdominal aorta. Her SMA stent occluded again as proved on CTA from 12/04/22.   Past Medical History:  Diagnosis Date   Anemia    Chronic mesenteric ischemia (HCC)    Fibroid    Hyperlipidemia    Hypertension    Neuromuscular disorder (HCC)    Neuropathy   NSTEMI (non-ST elevated myocardial infarction) (HCC) 06/2017   s/p DES to LCX 07/02/17   Sarcoidosis     Past Surgical History:   Procedure Laterality Date   ABDOMINAL AORTIC ANEURYSM REPAIR N/A 01/04/2023   Procedure: OPEN MESENTERIC BYPASS left common iliac to SMA, lysis of adhesions;  Surgeon: Cephus Shelling, MD;  Location: [facility] OR;  Service: Vascular;  Laterality: N/A;   ABDOMINAL AORTOGRAM N/A 07/05/2022   Procedure: ABDOMINAL AORTOGRAM;  Surgeon: Cephus Shelling, MD;  Location: [facility];  Service: Cardiovascular;  Laterality: N/A;   ABDOMINAL HYSTERECTOMY     BREAST BIOPSY Right 03/24/2018   BREAST REDUCTION SURGERY Bilateral 10/17/2020   Procedure: MAMMARY REDUCTION  (BREAST);  Surgeon: Allena Napoleon, MD;  Location: [facility] OR;  Service: Plastics;  Laterality: Bilateral;  2 hours   CESAREAN SECTION  1987   CORONARY/GRAFT ACUTE MI REVASCULARIZATION N/A 06/30/2017   Procedure: Coronary/Graft Acute MI Revascularization;  Surgeon: Runell Gess, MD;  Location: [facility];  Service: Cardiovascular;  Laterality: N/A;   FOREARM FRACTURE SURGERY Right 1977   FRACTURE SURGERY     LAPAROSCOPY N/A 05/04/2022   Procedure: LAPAROSCOPY DIAGNOSTIC;  Surgeon: Axel Filler, MD;  Location[facility] OR;  Service: General;  Laterality: N/A;   LAPAROTOMY N/A 05/04/2022   Procedure: EXPLORATORY LAPAROTOMY;  Surgeon: Axel Filler, MD;  Location: [facility] OR;  Service: General;  Laterality: N/A;   LEFT HEART CATH AND CORONARY ANGIOGRAPHY N/A 06/30/2017   Procedure: LEFT HEART CATH AND CORONARY ANGIOGRAPHY;  Surgeon: Runell Gess, MD;  [facility];  Service: Cardiovascular;  Laterality: N/A;   LEFT HEART CATH AND CORONARY ANGIOGRAPHY N/A 12/23/2017  Procedure: LEFT HEART CATH AND CORONARY ANGIOGRAPHY;  Surgeon: Runell Gess, MD;  [facility];  Service: Cardiovascular;  Laterality: N/A;   PERIPHERAL VASCULAR INTERVENTION  07/05/2022   Procedure: PERIPHERAL VASCULAR INTERVENTION;  Surgeon: Cephus Shelling, MD;  Location: [facility];  Service: Cardiovascular;;  SMA    PERIPHERAL VASCULAR THROMBECTOMY N/A 07/05/2022   Procedure: PERIPHERAL VASCULAR THROMBECTOMY;  Surgeon: Cephus Shelling, MD;  Location: [facility];  Service: Cardiovascular;  Laterality: N/A;  SMA   TUBAL LIGATION  1990   VEIN HARVEST  01/04/2023   Procedure: VEIN HARVEST RIGHT GREATER SAPHENOUS;  Surgeon: Cephus Shelling, MD;  Location: [facility] OR;  Service: Vascular;;    Family History  Problem Relation Age of Onset   Diabetes Mother    Stroke Mother 67   Hypertension Mother    Hyperlipidemia Father    Stroke Father 82   Hypertension Father    Breast cancer Paternal Grandmother     SOCIAL HISTORY: Social History   Socioeconomic History   Marital status: Widowed    Spouse name: Not on file   Number of children: 2   Years of education: Not on file   Highest education level: Not on file  Occupational History   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 right   Are you currently employed ?    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 befriend   Who lives with you?    What type of home do you live in: 1 story or 2 story? two       Social Determinants of Health   Financial Resource Strain: Not on file  Food Insecurity: No Food Insecurity (05/04/2022)   Hunger Vital Sign    Worried About Running Out of  Food in the Last Year: Never true    Ran Out of Food in the Last Year: Never true  Transportation Needs: No Transportation Needs (05/04/2022)   PRAPARE - Administrator, Civil Service (Medical): No    Lack of Transportation (Non-Medical): No  Physical Activity: Not on file  Stress: Not on file  Social Connections: Not on file  Intimate Partner Violence: Not At Risk (05/04/2022)   Humiliation, Afraid, Rape, and Kick questionnaire    Fear of Current or Ex-Partner: No    Emotionally Abused: No    Physically Abused: No    Sexually Abused: No    Allergies  Allergen Reactions   Penicillins Itching and Rash    Caused rash and blisters   Hydrocodone Hives and Itching    Current Outpatient Medications  Medication Sig Dispense Refill   acetaminophen (  TYLENOL) 500 MG tablet Take 1,000 mg by mouth every 6 (six) hours as needed for moderate pain.     aspirin EC 81 MG tablet Take 1 tablet (81 mg total) by mouth daily. Swallow whole. 30 tablet    atorvastatin (LIPITOR) 40 MG tablet TAKE 1 TABLET(40 MG) BY MOUTH DAILY 30 tablet 0   clopidogrel (PLAVIX) 75 MG tablet Take 1 tablet (75 mg total) by mouth daily with breakfast. 30 tablet 11   gabapentin (NEURONTIN) 300 MG capsule Take 1 capsule (300 mg) in the morning and take 2 capsules (600 mg) at bedtime 180 capsule 3   metoprolol succinate (TOPROL-XL) 25 MG 24 hr tablet Take 25 mg by mouth daily.     oxyCODONE (OXY IR/ROXICODONE) 5 MG immediate release tablet Take 1-2 tablets (5-10 mg total) by mouth every 6 (six) hours as needed for moderate pain or severe pain. 25 tablet 0   thiamine (VITAMIN B-1) 100 MG tablet Take 100 mg by mouth daily.     No current facility-administered medications for this visit.    REVIEW OF SYSTEMS:  [X]  denotes positive finding, [ ]  denotes negative finding Cardiac  Comments:  Chest pain or chest pressure:    Shortness of breath upon exertion:    Short of breath when lying flat:    Irregular heart  rhythm:        Vascular    Pain in calf, thigh, or hip brought on by ambulation:    Pain in feet at night that wakes you up from your sleep:     Blood clot in your veins:    Leg swelling:         Pulmonary    Oxygen at home:    Productive cough:     Wheezing:         Neurologic    Sudden weakness in arms or legs:     Sudden numbness in arms or legs:     Sudden onset of difficulty speaking or slurred speech:    Temporary loss of vision in one eye:     Problems with dizziness:         Gastrointestinal    Blood in stool:     Vomited blood:     Abdominal pain    Genitourinary    Burning when urinating:     Blood in urine:        Psychiatric    Major depression:         Hematologic    Bleeding problems:    Problems with blood clotting too easily:        Skin    Rashes or ulcers:        Constitutional    Fever or chills:      PHYSICAL EXAM: There were no vitals filed for this visit.   GENERAL: The patient is a well-nourished female, in no acute distress. The vital signs are documented above. CARDIAC: There is a regular rate and rhythm.  VASCULAR: Midline incision healing with staples No significant rebound or guarding Left DP palpable PULMONARY: No respiratory distress. MUSCULOSKELETAL: There are no major deformities or cyanosis. NEUROLOGIC: No focal weakness or paresthesias are detected. PSYCHIATRIC: The patient has a normal affect.  DATA:   N/A  Assessment/Plan:   57 y.o. female, that presents for postop check after direct open retrograde revascularization for chronic mesenteric ischemia with occluded SMA stent.  On 01/04/2023 she underwent retrograde left common iliac artery to superior mesenteric artery bypass using  great saphenous vein.  She did well postoperatively and was discharged.  Today her main complaint is ongoing postop incisional pain along the midline incision.  This appears to be healing appropriately.  Will remove her staples today.  I did  send prescription for Zofran to her pharmacy as she also is complaining of nausea when taking her pain meds only.  I also gave her muscle relaxer with Flexeril.  I will have her come back and see me in 3 to 4 weeks.  Overall seem to be making good progress and feels clinically she is much better than prior to mesenteric bypass as it relates to her mesenteric symptoms.    Cephus Shelling, MD [facility] [place] Office: [phone]

## 2023-01-25 ENCOUNTER — Ambulatory Visit: Payer: Commercial Managed Care - PPO | Admitting: Vascular Surgery

## 2023-01-25 ENCOUNTER — Encounter: Payer: Self-pay | Admitting: Vascular Surgery

## 2023-01-25 VITALS — BP 136/84 | HR 84 | Temp 98.2°F | Ht 64.5 in | Wt 192.4 lb

## 2023-01-25 DIAGNOSIS — Z0279 Encounter for issue of other medical certificate: Secondary | ICD-10-CM

## 2023-01-25 DIAGNOSIS — K551 Chronic vascular disorders of intestine: Secondary | ICD-10-CM

## 2023-01-25 MED ORDER — CYCLOBENZAPRINE HCL 5 MG PO TABS: 5.0000 mg | ORAL_TABLET | Freq: Three times a day (TID) | ORAL | 0 refills | Status: AC | PRN

## 2023-01-25 MED ORDER — ONDANSETRON HCL 4 MG PO TABS: 4.0000 mg | ORAL_TABLET | Freq: Three times a day (TID) | ORAL | 0 refills | Status: DC | PRN

## 2023-02-15 ENCOUNTER — Telehealth: Payer: Self-pay

## 2023-02-15 NOTE — Telephone Encounter (Signed)
Pt walked into clinic, filled out triage form requesting pain meds, left office, form given to triage RN.  Reviewed pt's chart, returned call for clarification, two identifiers used. Informed her that she would need to be evaluated to receive any additional pain meds. Instructed her to go to ED if her pain was severe. Pt has upcoming appt on Tuesday, 9/10. Confirmed understanding.

## 2023-02-18 NOTE — Progress Notes (Signed)
Patient name: [name] MRN: [id number] DOB: 12/25/1965 Sex: female  REASON FOR CONSULT: Ongoing postop after open retrograde mesenteric bypass  HPI: [name] is a 57 y.o. female, that presents for ongoing postop check after direct open retrograde revascularization for chronic mesenteric ischemia with occluded SMA stent.  On 01/04/2023 she underwent retrograde left common iliac artery to superior mesenteric artery bypass using great saphenous vein.  She did well postoperatively and was discharged.    Today she states she has pain along her midline where the staples are.  Overall her abdominal pain is much better since mesenteric bypass.  Able to eat and drink and having bowel function.  The pain medicine oxycodone does make her nauseated.  She initially underwent percutaneous mechanical thrombectomy of the SMA with angioplasty and stenting on 05/03/2022 for acute mesenteric ischemia with SMA thrombus.  She did require ex lap with all viable bowel but had evidence of hemoperitoneum with no bleeding source identified by general surgery. She had a slow hospital course but ultimately was discharged home on aspirin and Plavix.     I subsequently sent her for CTA abdomen pelvis given her slow clinical progress.  Her CTA 06/23/22 showed recurrent thrombus in the SMA stent with associated thrombus in her visceral aorta as demonstrated on previous imaging.  I started her on Eliquis.  I took her back to the Cath Lab on 07/05/2022 for percutaneous thrombectomy of the SMA stent and extension of the SMA stent into the abdominal aorta. Her SMA stent occluded again as proved on CTA from 12/04/22.   Past Medical History:  Diagnosis Date   Anemia    Chronic mesenteric ischemia (HCC)    Fibroid    Hyperlipidemia    Hypertension    Neuromuscular disorder (HCC)    Neuropathy   NSTEMI (non-ST elevated myocardial infarction) (HCC) 06/2017   s/p DES to LCX 07/02/17   Sarcoidosis     Past  Surgical History:  Procedure Laterality Date   ABDOMINAL AORTIC ANEURYSM REPAIR N/A 01/04/2023   Procedure: OPEN MESENTERIC BYPASS left common iliac to SMA, lysis of adhesions;  Surgeon: Cephus Shelling, MD;  Location: [facility] OR;  Service: Vascular;  Laterality: N/A;   ABDOMINAL AORTOGRAM N/A 07/05/2022   Procedure: ABDOMINAL AORTOGRAM;  Surgeon: Cephus Shelling, MD;  Location: [facility];  Service: Cardiovascular;  Laterality: N/A;   ABDOMINAL HYSTERECTOMY     BREAST BIOPSY Right 03/24/2018   BREAST REDUCTION SURGERY Bilateral 10/17/2020   Procedure: MAMMARY REDUCTION  (BREAST);  Surgeon: Allena Napoleon, MD;  Location: [facility] OR;  Service: Plastics;  Laterality: Bilateral;  2 hours   CESAREAN SECTION  1987   CORONARY/GRAFT ACUTE MI REVASCULARIZATION N/A 06/30/2017   Procedure: Coronary/Graft Acute MI Revascularization;  Surgeon: Runell Gess, MD;  Location: [facility];  Service: Cardiovascular;  Laterality: N/A;   FOREARM FRACTURE SURGERY Right 1977   FRACTURE SURGERY     LAPAROSCOPY N/A 05/04/2022   Procedure: LAPAROSCOPY DIAGNOSTIC;  Surgeon: Axel Filler, MD;  Location: [facility] OR;  Service: General;  Laterality: N/A;   LAPAROTOMY N/A 05/04/2022   Procedure: EXPLORATORY LAPAROTOMY;  Surgeon: Axel Filler, MD;  Location: [facility] OR;  Service: General;  Laterality: N/A;   LEFT HEART CATH AND CORONARY ANGIOGRAPHY N/A 06/30/2017   Procedure: LEFT HEART CATH AND CORONARY ANGIOGRAPHY;  Surgeon: Runell Gess, MD;  [facility]: [facility];  Service: Cardiovascular;  Laterality: N/A;   LEFT HEART CATH AND  CORONARY ANGIOGRAPHY N/A 12/23/2017   Procedure: LEFT HEART CATH AND CORONARY ANGIOGRAPHY;  Surgeon: Runell Gess, MD;  Location: [facility];  Service: Cardiovascular;  Laterality: N/A;   PERIPHERAL VASCULAR INTERVENTION  07/05/2022   Procedure: PERIPHERAL VASCULAR INTERVENTION;  Surgeon: Cephus Shelling, MD;  Location: [facility];  Service:  Cardiovascular;;  SMA   PERIPHERAL VASCULAR THROMBECTOMY N/A 07/05/2022   Procedure: PERIPHERAL VASCULAR THROMBECTOMY;  Surgeon: Cephus Shelling, MD;  Location: [facility];  Service: Cardiovascular;  Laterality: N/A;  SMA   TUBAL LIGATION  1990   VEIN HARVEST  01/04/2023   Procedure: VEIN HARVEST RIGHT GREATER SAPHENOUS;  Surgeon: Cephus Shelling, MD;  Location: [facility] OR;  Service: Vascular;;    Family History  Problem Relation Age of Onset   Diabetes Mother    Stroke Mother 46   Hypertension Mother    Hyperlipidemia Father    Stroke Father 53   Hypertension Father    Breast cancer Paternal Grandmother     SOCIAL HISTORY: Social History   Socioeconomic History   Marital status: Widowed    Spouse name: Not on file   Number of children: 2   Years of education: Not on file   Highest education level: Not on file  Occupational History   Occupation: Quarry manager: Company secretary  Tobacco Use   Smoking status: Former    Average packs/day: 1 pack/day for 29.0 years (29.0 ttl pk-yrs)    Types: Cigarettes    Start date: 03/19/1989    Quit date: 03/19/2018    Years since quitting: 4.9   Smokeless tobacco: Never   Tobacco comments:    11/09/2021 Patient smokes some days  started back in 2023  Vaping Use   Vaping status: Never Used  Substance and Sexual Activity   Alcohol use: Not Currently    Comment: /no   Drug use: Never   Sexual activity: Not on file  Other Topics Concern   Not on file  Social History Narrative   Are you right handed or left handed? right   Are you currently employed ?    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 befriend   Who lives with you?    What type of home do you live in: 1 story or 2 story? two       Social Determinants of Health   Financial Resource Strain: Not on file  Food Insecurity: No Food Insecurity (05/04/2022)   Hunger Vital Sign    Worried  About Running Out of Food in the Last Year: Never true    Ran Out of Food in the Last Year: Never true  Transportation Needs: No Transportation Needs (05/04/2022)   PRAPARE - Administrator, Civil Service (Medical): No    Lack of Transportation (Non-Medical): No  Physical Activity: Not on file  Stress: Not on file  Social Connections: Not on file  Intimate Partner Violence: Not At Risk (05/04/2022)   Humiliation, Afraid, Rape, and Kick questionnaire    Fear of Current or Ex-Partner: No    Emotionally Abused: No    Physically Abused: No    Sexually Abused: No    Allergies  Allergen Reactions   Penicillins Itching and Rash    Caused rash and blisters   Hydrocodone Hives and Itching    Current Outpatient Medications  Medication  Sig Dispense Refill   acetaminophen (TYLENOL) 500 MG tablet Take 1,000 mg by mouth every 6 (six) hours as needed for moderate pain.     aspirin EC 81 MG tablet Take 1 tablet (81 mg total) by mouth daily. Swallow whole. 30 tablet    atorvastatin (LIPITOR) 40 MG tablet TAKE 1 TABLET(40 MG) BY MOUTH DAILY 30 tablet 0   clopidogrel (PLAVIX) 75 MG tablet Take 1 tablet (75 mg total) by mouth daily with breakfast. 30 tablet 11   cyclobenzaprine (FLEXERIL) 5 MG tablet Take 1 tablet (5 mg total) by mouth 3 (three) times daily as needed for muscle spasms. 40 tablet 0   gabapentin (NEURONTIN) 300 MG capsule Take 1 capsule (300 mg) in the morning and take 2 capsules (600 mg) at bedtime 180 capsule 3   metoprolol succinate (TOPROL-XL) 25 MG 24 hr tablet Take 25 mg by mouth daily.     ondansetron (ZOFRAN) 4 MG tablet Take 1 tablet (4 mg total) by mouth every 8 (eight) hours as needed for nausea or vomiting. 30 tablet 0   oxyCODONE (OXY IR/ROXICODONE) 5 MG immediate release tablet Take 1-2 tablets (5-10 mg total) by mouth every 6 (six) hours as needed for moderate pain or severe pain. 25 tablet 0   thiamine (VITAMIN B-1) 100 MG tablet Take 100 mg by mouth daily.      No current facility-administered medications for this visit.    REVIEW OF SYSTEMS:  [X]  denotes positive finding, [ ]  denotes negative finding Cardiac  Comments:  Chest pain or chest pressure:    Shortness of breath upon exertion:    Short of breath when lying flat:    Irregular heart rhythm:        Vascular    Pain in calf, thigh, or hip brought on by ambulation:    Pain in feet at night that wakes you up from your sleep:     Blood clot in your veins:    Leg swelling:         Pulmonary    Oxygen at home:    Productive cough:     Wheezing:         Neurologic    Sudden weakness in arms or legs:     Sudden numbness in arms or legs:     Sudden onset of difficulty speaking or slurred speech:    Temporary loss of vision in one eye:     Problems with dizziness:         Gastrointestinal    Blood in stool:     Vomited blood:     Abdominal pain    Genitourinary    Burning when urinating:     Blood in urine:        Psychiatric    Major depression:         Hematologic    Bleeding problems:    Problems with blood clotting too easily:        Skin    Rashes or ulcers:        Constitutional    Fever or chills:      PHYSICAL EXAM: There were no vitals filed for this visit.   GENERAL: The patient is a well-nourished female, in no acute distress. The vital signs are documented above. CARDIAC: There is a regular rate and rhythm.  VASCULAR: Midline incision healing with staples No significant rebound or guarding Left DP palpable PULMONARY: No respiratory distress. MUSCULOSKELETAL: There are no major deformities or cyanosis.  NEUROLOGIC: No focal weakness or paresthesias are detected. PSYCHIATRIC: The patient has a normal affect.  DATA:   N/A  Assessment/Plan:   57 y.o. female, that presents for ongoing postop check after direct open retrograde revascularization for chronic mesenteric ischemia with occluded SMA stent.  On 01/04/2023 she underwent retrograde left  common iliac artery to superior mesenteric artery bypass using great saphenous vein.  She did well postoperatively and was discharged.      Cephus Shelling, MD [facility] [place] Office: [phone]

## 2023-02-19 ENCOUNTER — Encounter: Payer: Self-pay | Admitting: Vascular Surgery

## 2023-02-19 ENCOUNTER — Ambulatory Visit (INDEPENDENT_AMBULATORY_CARE_PROVIDER_SITE_OTHER): Payer: Commercial Managed Care - PPO | Admitting: Vascular Surgery

## 2023-02-19 VITALS — BP 167/87 | HR 81 | Temp 98.2°F | Resp 18 | Ht 65.0 in | Wt 197.2 lb

## 2023-02-19 DIAGNOSIS — K55069 Acute infarction of intestine, part and extent unspecified: Secondary | ICD-10-CM

## 2023-02-19 DIAGNOSIS — K559 Vascular disorder of intestine, unspecified: Secondary | ICD-10-CM

## 2023-02-19 DIAGNOSIS — K551 Chronic vascular disorders of intestine: Secondary | ICD-10-CM

## 2023-02-19 MED ORDER — OXYCODONE HCL 5 MG PO TABS: 5.0000 mg | ORAL_TABLET | Freq: Four times a day (QID) | ORAL | 0 refills | Status: DC | PRN

## 2023-02-27 ENCOUNTER — Other Ambulatory Visit: Payer: Self-pay

## 2023-02-27 DIAGNOSIS — K551 Chronic vascular disorders of intestine: Secondary | ICD-10-CM

## 2023-03-08 ENCOUNTER — Other Ambulatory Visit: Payer: Self-pay | Admitting: Vascular Surgery

## 2023-04-01 ENCOUNTER — Ambulatory Visit (HOSPITAL_COMMUNITY)
Admission: RE | Admit: 2023-04-01 | Discharge: 2023-04-01 | Disposition: A | Discharge status: Home / Self Care | Payer: Commercial Managed Care - PPO | Source: Ambulatory Visit | Attending: Surgery | Admitting: Surgery

## 2023-04-01 DIAGNOSIS — K551 Chronic vascular disorders of intestine: Secondary | ICD-10-CM | POA: Diagnosis not present

## 2023-04-01 NOTE — Progress Notes (Unsigned)
Patient name: [name] MRN: [id number] DOB: 11-10-1965 Sex: female  REASON FOR CONSULT: Follow-up after open retrograde mesenteric bypass  HPI: [name] is a 57 y.o. female, that presents for ongoing follow-up after direct open retrograde revascularization for chronic mesenteric ischemia with occluded SMA stent.  On 01/04/2023 she underwent retrograde left common iliac artery to superior mesenteric artery bypass using great saphenous vein.  She did well postoperatively and was discharged.  On follow-up today still complaining of midline incisional pain.  States she has significant reflux.  Bowels are moving.  She is tolerating oral intake.  She initially underwent percutaneous mechanical thrombectomy of the SMA with angioplasty and stenting on 05/03/2022 for acute mesenteric ischemia with SMA thrombus.  She did require ex lap with all viable bowel but had evidence of hemoperitoneum with no bleeding source identified by general surgery. She had a slow hospital course but ultimately was discharged home on aspirin and Plavix.     I subsequently sent her for CTA abdomen pelvis given her slow clinical progress.  Her CTA 06/23/22 showed recurrent thrombus in the SMA stent with associated thrombus in her visceral aorta as demonstrated on previous imaging.  I started her on Eliquis.  I took her back to the Cath Lab on 07/05/2022 for percutaneous thrombectomy of the SMA stent and extension of the SMA stent into the abdominal aorta. Her SMA stent occluded again as proved on CTA from 12/04/22.   Past Medical History:  Diagnosis Date   Anemia    Chronic mesenteric ischemia (HCC)    Fibroid    Hyperlipidemia    Hypertension    Neuromuscular disorder (HCC)    Neuropathy   NSTEMI (non-ST elevated myocardial infarction) (HCC) 06/2017   s/p DES to LCX 07/02/17   Sarcoidosis     Past Surgical History:  Procedure Laterality Date   ABDOMINAL AORTIC ANEURYSM REPAIR N/A 01/04/2023   Procedure:  OPEN MESENTERIC BYPASS left common iliac to SMA, lysis of adhesions;  Surgeon: Cephus Shelling, MD;  Location: [facility] OR;  Service: Vascular;  Laterality: N/A;   ABDOMINAL AORTOGRAM N/A 07/05/2022   Procedure: ABDOMINAL AORTOGRAM;  Surgeon: Cephus Shelling, MD;  Location: [facility];  Service: Cardiovascular;  Laterality: N/A;   ABDOMINAL HYSTERECTOMY     BREAST BIOPSY Right 03/24/2018   BREAST REDUCTION SURGERY Bilateral 10/17/2020   Procedure: MAMMARY REDUCTION  (BREAST);  Surgeon: Allena Napoleon, MD;  Location: [facility] OR;  Service: Plastics;  Laterality: Bilateral;  2 hours   CESAREAN SECTION  1987   CORONARY/GRAFT ACUTE MI REVASCULARIZATION N/A 06/30/2017   Procedure: Coronary/Graft Acute MI Revascularization;  Surgeon: Runell Gess, MD;  Location: [facility];  Service: Cardiovascular;  Laterality: N/A;   FOREARM FRACTURE SURGERY Right 1977   FRACTURE SURGERY     LAPAROSCOPY N/A 05/04/2022   Procedure: LAPAROSCOPY DIAGNOSTIC;  Surgeon: Axel Filler, MD;  Location: [facility] OR;  Service: General;  Laterality: N/A;   LAPAROTOMY N/A 05/04/2022   Procedure: EXPLORATORY LAPAROTOMY;  Surgeon: Axel Filler, MD;  Location: [facility] OR;  Service: General;  Laterality: N/A;   LEFT HEART CATH AND CORONARY ANGIOGRAPHY N/A 06/30/2017   Procedure: LEFT HEART CATH AND CORONARY ANGIOGRAPHY;  Surgeon: Runell Gess, MD;  Location: [facility];  Service: Cardiovascular;  Laterality: N/A;   LEFT HEART CATH AND CORONARY ANGIOGRAPHY N/A 12/23/2017   Procedure: LEFT HEART CATH AND CORONARY ANGIOGRAPHY;  Surgeon: Runell Gess, MD;  Location: [facility]  Patient name: [name] MRN: [id number] DOB: 11-10-1965 Sex: female  REASON FOR CONSULT: Follow-up after open retrograde mesenteric bypass  HPI: [name] is a 57 y.o. female, that presents for ongoing follow-up after direct open retrograde revascularization for chronic mesenteric ischemia with occluded SMA stent.  On 01/04/2023 she underwent retrograde left common iliac artery to superior mesenteric artery bypass using great saphenous vein.  She did well postoperatively and was discharged.  On follow-up today still complaining of midline incisional pain.  States she has significant reflux.  Bowels are moving.  She is tolerating oral intake.  She initially underwent percutaneous mechanical thrombectomy of the SMA with angioplasty and stenting on 05/03/2022 for acute mesenteric ischemia with SMA thrombus.  She did require ex lap with all viable bowel but had evidence of hemoperitoneum with no bleeding source identified by general surgery. She had a slow hospital course but ultimately was discharged home on aspirin and Plavix.     I subsequently sent her for CTA abdomen pelvis given her slow clinical progress.  Her CTA 06/23/22 showed recurrent thrombus in the SMA stent with associated thrombus in her visceral aorta as demonstrated on previous imaging.  I started her on Eliquis.  I took her back to the Cath Lab on 07/05/2022 for percutaneous thrombectomy of the SMA stent and extension of the SMA stent into the abdominal aorta. Her SMA stent occluded again as proved on CTA from 12/04/22.   Past Medical History:  Diagnosis Date   Anemia    Chronic mesenteric ischemia (HCC)    Fibroid    Hyperlipidemia    Hypertension    Neuromuscular disorder (HCC)    Neuropathy   NSTEMI (non-ST elevated myocardial infarction) (HCC) 06/2017   s/p DES to LCX 07/02/17   Sarcoidosis     Past Surgical History:  Procedure Laterality Date   ABDOMINAL AORTIC ANEURYSM REPAIR N/A 01/04/2023   Procedure:  OPEN MESENTERIC BYPASS left common iliac to SMA, lysis of adhesions;  Surgeon: Cephus Shelling, MD;  Location: [facility] OR;  Service: Vascular;  Laterality: N/A;   ABDOMINAL AORTOGRAM N/A 07/05/2022   Procedure: ABDOMINAL AORTOGRAM;  Surgeon: Cephus Shelling, MD;  Location: [facility];  Service: Cardiovascular;  Laterality: N/A;   ABDOMINAL HYSTERECTOMY     BREAST BIOPSY Right 03/24/2018   BREAST REDUCTION SURGERY Bilateral 10/17/2020   Procedure: MAMMARY REDUCTION  (BREAST);  Surgeon: Allena Napoleon, MD;  Location: [facility] OR;  Service: Plastics;  Laterality: Bilateral;  2 hours   CESAREAN SECTION  1987   CORONARY/GRAFT ACUTE MI REVASCULARIZATION N/A 06/30/2017   Procedure: Coronary/Graft Acute MI Revascularization;  Surgeon: Runell Gess, MD;  Location: [facility];  Service: Cardiovascular;  Laterality: N/A;   FOREARM FRACTURE SURGERY Right 1977   FRACTURE SURGERY     LAPAROSCOPY N/A 05/04/2022   Procedure: LAPAROSCOPY DIAGNOSTIC;  Surgeon: Axel Filler, MD;  Location: [facility] OR;  Service: General;  Laterality: N/A;   LAPAROTOMY N/A 05/04/2022   Procedure: EXPLORATORY LAPAROTOMY;  Surgeon: Axel Filler, MD;  Location: [facility] OR;  Service: General;  Laterality: N/A;   LEFT HEART CATH AND CORONARY ANGIOGRAPHY N/A 06/30/2017   Procedure: LEFT HEART CATH AND CORONARY ANGIOGRAPHY;  Surgeon: Runell Gess, MD;  Location: [facility];  Service: Cardiovascular;  Laterality: N/A;   LEFT HEART CATH AND CORONARY ANGIOGRAPHY N/A 12/23/2017   Procedure: LEFT HEART CATH AND CORONARY ANGIOGRAPHY;  Surgeon: Runell Gess, MD;  Location: [facility]  Patient Name:  [name]  Date of Exam:   04/01/2023 Medical Rec #: [id number]         Accession #:    [id number] Date of Birth: [birth year]        Patient Gender: F Patient Age:   37 years Exam Location:  [facility] Procedure:      VAS [place] MESENTERIC Referring Phys: [id number]  Canary Brim Gustabo Gordillo   --------------------------------------------------------------------------- -----  Indications: s/p Left common iliac artery to SMA bypass graft using right great              saphenous vein 01/04/23  High Risk Factors: Hypertension, hyperlipidemia, current smoker, coronary artery                    disease.  Limitations: Air/bowel gas, obesity and patient discomfort. Comparison Study: 11/13/2022 Mesenteric artery duplex- 70 to 99% stenosis in the                   celiac artery.                   Limited visualization of the abdominal vasculature.                   Due to limited visualization, the documented portions of the                   SMA in the table above may be collateral flow.  Performing Technologist: Gertie Fey MHA, RDMS, RVT, RDCS    Examination Guidelines: A complete evaluation includes B-mode imaging, spectral Doppler, color Doppler, and power Doppler as needed of all accessible portions of each vessel. Bilateral testing is considered an integral part of a complete examination. Limited examinations for reoccurring indications may be performed as noted.    Duplex Findings: Left Graft #1: +--------------------+--------+--------+----------+--------+                     PSV cm/sStenosisWaveform  Comments +--------------------+--------+--------+----------+--------+ Inflow              74              monophasic         +--------------------+--------+--------+----------+--------+ Proximal Anastomosis239             monophasic         +--------------------+--------+--------+----------+--------+ Proximal Graft      120             monophasic         +--------------------+--------+--------+----------+--------+ Mid Graft           168             monophasic         +--------------------+--------+--------+----------+--------+ Distal Graft        267             monophasic          +--------------------+--------+--------+----------+--------+ Distal Anastamosis  130             monophasic         +--------------------+--------+--------+----------+--------+ Outflow             140             monophasic         +--------------------+--------+--------+----------+--------+    Summary: Mesenteric:   Patent left common iliac artery to SMA bypass graft. Elevated velocities noted at the proximal anastomosis and distal graft. Unable to adequately evaluate SMA  Patient name: [name] MRN: [id number] DOB: 11-10-1965 Sex: female  REASON FOR CONSULT: Follow-up after open retrograde mesenteric bypass  HPI: [name] is a 57 y.o. female, that presents for ongoing follow-up after direct open retrograde revascularization for chronic mesenteric ischemia with occluded SMA stent.  On 01/04/2023 she underwent retrograde left common iliac artery to superior mesenteric artery bypass using great saphenous vein.  She did well postoperatively and was discharged.  On follow-up today still complaining of midline incisional pain.  States she has significant reflux.  Bowels are moving.  She is tolerating oral intake.  She initially underwent percutaneous mechanical thrombectomy of the SMA with angioplasty and stenting on 05/03/2022 for acute mesenteric ischemia with SMA thrombus.  She did require ex lap with all viable bowel but had evidence of hemoperitoneum with no bleeding source identified by general surgery. She had a slow hospital course but ultimately was discharged home on aspirin and Plavix.     I subsequently sent her for CTA abdomen pelvis given her slow clinical progress.  Her CTA 06/23/22 showed recurrent thrombus in the SMA stent with associated thrombus in her visceral aorta as demonstrated on previous imaging.  I started her on Eliquis.  I took her back to the Cath Lab on 07/05/2022 for percutaneous thrombectomy of the SMA stent and extension of the SMA stent into the abdominal aorta. Her SMA stent occluded again as proved on CTA from 12/04/22.   Past Medical History:  Diagnosis Date   Anemia    Chronic mesenteric ischemia (HCC)    Fibroid    Hyperlipidemia    Hypertension    Neuromuscular disorder (HCC)    Neuropathy   NSTEMI (non-ST elevated myocardial infarction) (HCC) 06/2017   s/p DES to LCX 07/02/17   Sarcoidosis     Past Surgical History:  Procedure Laterality Date   ABDOMINAL AORTIC ANEURYSM REPAIR N/A 01/04/2023   Procedure:  OPEN MESENTERIC BYPASS left common iliac to SMA, lysis of adhesions;  Surgeon: Cephus Shelling, MD;  Location: [facility] OR;  Service: Vascular;  Laterality: N/A;   ABDOMINAL AORTOGRAM N/A 07/05/2022   Procedure: ABDOMINAL AORTOGRAM;  Surgeon: Cephus Shelling, MD;  Location: [facility];  Service: Cardiovascular;  Laterality: N/A;   ABDOMINAL HYSTERECTOMY     BREAST BIOPSY Right 03/24/2018   BREAST REDUCTION SURGERY Bilateral 10/17/2020   Procedure: MAMMARY REDUCTION  (BREAST);  Surgeon: Allena Napoleon, MD;  Location: [facility] OR;  Service: Plastics;  Laterality: Bilateral;  2 hours   CESAREAN SECTION  1987   CORONARY/GRAFT ACUTE MI REVASCULARIZATION N/A 06/30/2017   Procedure: Coronary/Graft Acute MI Revascularization;  Surgeon: Runell Gess, MD;  Location: [facility];  Service: Cardiovascular;  Laterality: N/A;   FOREARM FRACTURE SURGERY Right 1977   FRACTURE SURGERY     LAPAROSCOPY N/A 05/04/2022   Procedure: LAPAROSCOPY DIAGNOSTIC;  Surgeon: Axel Filler, MD;  Location: [facility] OR;  Service: General;  Laterality: N/A;   LAPAROTOMY N/A 05/04/2022   Procedure: EXPLORATORY LAPAROTOMY;  Surgeon: Axel Filler, MD;  Location: [facility] OR;  Service: General;  Laterality: N/A;   LEFT HEART CATH AND CORONARY ANGIOGRAPHY N/A 06/30/2017   Procedure: LEFT HEART CATH AND CORONARY ANGIOGRAPHY;  Surgeon: Runell Gess, MD;  Location: [facility];  Service: Cardiovascular;  Laterality: N/A;   LEFT HEART CATH AND CORONARY ANGIOGRAPHY N/A 12/23/2017   Procedure: LEFT HEART CATH AND CORONARY ANGIOGRAPHY;  Surgeon: Runell Gess, MD;  Location: [facility]  Patient name: [name] MRN: [id number] DOB: 11-10-1965 Sex: female  REASON FOR CONSULT: Follow-up after open retrograde mesenteric bypass  HPI: [name] is a 57 y.o. female, that presents for ongoing follow-up after direct open retrograde revascularization for chronic mesenteric ischemia with occluded SMA stent.  On 01/04/2023 she underwent retrograde left common iliac artery to superior mesenteric artery bypass using great saphenous vein.  She did well postoperatively and was discharged.  On follow-up today still complaining of midline incisional pain.  States she has significant reflux.  Bowels are moving.  She is tolerating oral intake.  She initially underwent percutaneous mechanical thrombectomy of the SMA with angioplasty and stenting on 05/03/2022 for acute mesenteric ischemia with SMA thrombus.  She did require ex lap with all viable bowel but had evidence of hemoperitoneum with no bleeding source identified by general surgery. She had a slow hospital course but ultimately was discharged home on aspirin and Plavix.     I subsequently sent her for CTA abdomen pelvis given her slow clinical progress.  Her CTA 06/23/22 showed recurrent thrombus in the SMA stent with associated thrombus in her visceral aorta as demonstrated on previous imaging.  I started her on Eliquis.  I took her back to the Cath Lab on 07/05/2022 for percutaneous thrombectomy of the SMA stent and extension of the SMA stent into the abdominal aorta. Her SMA stent occluded again as proved on CTA from 12/04/22.   Past Medical History:  Diagnosis Date   Anemia    Chronic mesenteric ischemia (HCC)    Fibroid    Hyperlipidemia    Hypertension    Neuromuscular disorder (HCC)    Neuropathy   NSTEMI (non-ST elevated myocardial infarction) (HCC) 06/2017   s/p DES to LCX 07/02/17   Sarcoidosis     Past Surgical History:  Procedure Laterality Date   ABDOMINAL AORTIC ANEURYSM REPAIR N/A 01/04/2023   Procedure:  OPEN MESENTERIC BYPASS left common iliac to SMA, lysis of adhesions;  Surgeon: Cephus Shelling, MD;  Location: [facility] OR;  Service: Vascular;  Laterality: N/A;   ABDOMINAL AORTOGRAM N/A 07/05/2022   Procedure: ABDOMINAL AORTOGRAM;  Surgeon: Cephus Shelling, MD;  Location: [facility];  Service: Cardiovascular;  Laterality: N/A;   ABDOMINAL HYSTERECTOMY     BREAST BIOPSY Right 03/24/2018   BREAST REDUCTION SURGERY Bilateral 10/17/2020   Procedure: MAMMARY REDUCTION  (BREAST);  Surgeon: Allena Napoleon, MD;  Location: [facility] OR;  Service: Plastics;  Laterality: Bilateral;  2 hours   CESAREAN SECTION  1987   CORONARY/GRAFT ACUTE MI REVASCULARIZATION N/A 06/30/2017   Procedure: Coronary/Graft Acute MI Revascularization;  Surgeon: Runell Gess, MD;  Location: [facility];  Service: Cardiovascular;  Laterality: N/A;   FOREARM FRACTURE SURGERY Right 1977   FRACTURE SURGERY     LAPAROSCOPY N/A 05/04/2022   Procedure: LAPAROSCOPY DIAGNOSTIC;  Surgeon: Axel Filler, MD;  Location: [facility] OR;  Service: General;  Laterality: N/A;   LAPAROTOMY N/A 05/04/2022   Procedure: EXPLORATORY LAPAROTOMY;  Surgeon: Axel Filler, MD;  Location: [facility] OR;  Service: General;  Laterality: N/A;   LEFT HEART CATH AND CORONARY ANGIOGRAPHY N/A 06/30/2017   Procedure: LEFT HEART CATH AND CORONARY ANGIOGRAPHY;  Surgeon: Runell Gess, MD;  Location: [facility];  Service: Cardiovascular;  Laterality: N/A;   LEFT HEART CATH AND CORONARY ANGIOGRAPHY N/A 12/23/2017   Procedure: LEFT HEART CATH AND CORONARY ANGIOGRAPHY;  Surgeon: Runell Gess, MD;  Location: [facility]

## 2023-04-02 ENCOUNTER — Encounter: Payer: Self-pay | Admitting: Vascular Surgery

## 2023-04-02 ENCOUNTER — Ambulatory Visit: Payer: Commercial Managed Care - PPO | Admitting: Vascular Surgery

## 2023-04-02 VITALS — BP 144/81 | HR 72 | Temp 97.6°F | Resp 18 | Ht 65.0 in | Wt 199.0 lb

## 2023-04-02 DIAGNOSIS — K551 Chronic vascular disorders of intestine: Secondary | ICD-10-CM

## 2023-04-02 MED ORDER — ATORVASTATIN CALCIUM 40 MG PO TABS: 40.0000 mg | ORAL_TABLET | Freq: Every day | ORAL | 11 refills | Status: AC

## 2023-04-02 MED ORDER — CLOPIDOGREL BISULFATE 75 MG PO TABS: 75.0000 mg | ORAL_TABLET | Freq: Every day | ORAL | 11 refills | Status: DC

## 2023-04-23 ENCOUNTER — Other Ambulatory Visit: Payer: Self-pay

## 2023-04-23 DIAGNOSIS — K559 Vascular disorder of intestine, unspecified: Secondary | ICD-10-CM

## 2023-06-14 NOTE — Progress Notes (Signed)
 I saw [name] in neurology clinic on 06/21/23 in follow up for neuropathy.  HPI: [name] is a 58 y.o. year old female with a history of pre-DM, sarcoidosis, HLD, CAD c/b NSTEMI s/p stent, PVD c/b recent SMA thrombus s/p angioplasty and stent (04/2022), former smoker who we last saw on 12/14/22.  To briefly review: 06/15/22: Patient has numbness and tingling in bilateral feet to her knees. She thinks the right foot may be worse than the left. The inside of her feet will feel cold or hot. This has been present for at least 6 months. She mentions she can no longer walk in heels due to pain. She denies weakness in legs, imbalance, or falls. She sometimes has numbness/pain in the hands as well. Patient was seen at [facility] by Dr. Silva. She was started on gabapentin  300 mg qhs. It helps her sleep and the pain at night. She finds that the next morning the gabapentin  can leave her feeling tired and off balance. She has been on this for a few weeks.   She endorses cramps in her legs, particularly at night, that can wake her up.   She denies any constitutional symptoms like fever, night sweats, anorexia or unintentional weight loss.   EtOH use: Very rare Restrictive diet? No Family history of neuropathy/myopathy/neurologic disease? No  12/14/22: Patient drives a city bus. She sits all day and has noticed a lot of swelling in her legs. She has shooting pain and burning in her feet. She has great difficulty with pain, especially at night.    IFE showed no M protein. B1 was < 6. I recommended supplementation with B1 100 mg daily. Patient has been taking. HbA1c was not collected.   EMG on 09/03/22 was consistent with a mild axonal length dependent polyneuropathy. No abnormalities of the right upper limb were found.   Her gabapentin  was increased to 300 mg BID on 09/03/22 as well. This has helped some, but not enough.   Patient presented to the ED on 09/05/22 for left  arm pain, numbness, and facial paresthesia (?for 4 days) feeling like she slept on her arm wrong. MRI brain was normal. MRI cervical spine showed moderate to severe left C5-6 foraminal stenosis and moderate bilateral C4-5 foraminal stenosis. Patient was given prednisone  and NSGY outpatient follow up. She continues to have this pain. She has not seen NSGY. She has not done any physical therapy.   Patient again denies EtOH. She denies restrictive diet or weight loss.  Most recent Assessment and Plan (12/14/22): [name], a 58 y.o. female with a distal symmetric polyneuropathy, mild in degree on EMG. Her B1 was very low (thiamine deficiency), which is her only known risk factor currently. She denies EtOH use or malnutrition/losing weight. While there was no radiculopathy by EMG, recent MRI cervical spine showed moderate to severe left foraminal stenosis, which is likely the cause of her neck pain and symptoms in her left arm.   Plan: -Blood work: HbA1c, B1 -Physical therapy for cervical radiculopathy. -Will consider NSGY referral if she does not improve with PT -Continue B1 100 mg daily -Increase gabapentin  to 300 mg in morning and 600 mg at bedtime   Since their last visit: She has continued pain. Her hands and feet are swollen every morning. Her feet can feel very hot then cold. She has increased her gabapentin  to 600 mg BID. Her symptoms are frustrating her and keeping her up  at night.   Abdominal pain and surgery for lysis of adhesions and SMA bypass in 12/2022. She has been out of work since then. She is asking about disability today.  HbA1c was 6.4, consistent with pre-DM. B1 was still very low. I again recommended B1 supplementation. Patient states she is taking B1 100 mg every night. She had just started at last clinic visit.  EtOH use: very rare, 1-2 times per year Restrictive diet: No   MEDICATIONS:  Outpatient Encounter Medications as of 06/21/2023  Medication Sig    acetaminophen  (TYLENOL ) 500 MG tablet Take 1,000 mg by mouth every 6 (six) hours as needed for moderate pain.   aspirin  EC 81 MG tablet Take 1 tablet (81 mg total) by mouth daily. Swallow whole.   atorvastatin  (LIPITOR ) 40 MG tablet Take 1 tablet (40 mg total) by mouth daily.   clopidogrel  (PLAVIX ) 75 MG tablet Take 1 tablet (75 mg total) by mouth daily with breakfast.   cyclobenzaprine  (FLEXERIL ) 5 MG tablet Take 1 tablet (5 mg total) by mouth 3 (three) times daily as needed for muscle spasms.   gabapentin  (NEURONTIN ) 300 MG capsule Take 1 capsule (300 mg) in the morning and take 2 capsules (600 mg) at bedtime   metoprolol  succinate (TOPROL -XL) 25 MG 24 hr tablet Take 25 mg by mouth daily.   ondansetron  (ZOFRAN ) 4 MG tablet Take 1 tablet (4 mg total) by mouth every 8 (eight) hours as needed for nausea or vomiting.   thiamine (VITAMIN B-1) 100 MG tablet Take 100 mg by mouth daily.   oxyCODONE  (OXY IR/ROXICODONE ) 5 MG immediate release tablet Take 1-2 tablets (5-10 mg total) by mouth every 6 (six) hours as needed for moderate pain or severe pain. (Patient not taking: Reported on 06/21/2023)   No facility-administered encounter medications on file as of 06/21/2023.    PAST MEDICAL HISTORY: Past Medical History:  Diagnosis Date   Anemia    Chronic mesenteric ischemia (HCC)    Fibroid    Hyperlipidemia    Hypertension    Neuromuscular disorder (HCC)    Neuropathy   NSTEMI (non-ST elevated myocardial infarction) (HCC) 06/2017   s/p DES to LCX 07/02/17   Sarcoidosis     PAST SURGICAL HISTORY: Past Surgical History:  Procedure Laterality Date   ABDOMINAL AORTIC ANEURYSM REPAIR N/A 01/04/2023   Procedure: OPEN MESENTERIC BYPASS left common iliac to SMA, lysis of adhesions;  Surgeon: Gretta Lonni PARAS, MD;  Location: [facility] OR;  Service: Vascular;  Laterality: N/A;   ABDOMINAL AORTOGRAM N/A 07/05/2022   Procedure: ABDOMINAL AORTOGRAM;  Surgeon: Gretta Lonni PARAS, MD;  Location: [facility] INVASIVE CV  LAB;  Service: Cardiovascular;  Laterality: N/A;   ABDOMINAL HYSTERECTOMY     BREAST BIOPSY Right 03/24/2018   BREAST REDUCTION SURGERY Bilateral 10/17/2020   Procedure: MAMMARY REDUCTION  (BREAST);  Surgeon: Elisabeth Craig RAMAN, MD;  Location: [facility] OR;  Service: Plastics;  Laterality: Bilateral;  2 hours   CESAREAN SECTION  1987   CORONARY/GRAFT ACUTE MI REVASCULARIZATION N/A 06/30/2017   Procedure: Coronary/Graft Acute MI Revascularization;  Surgeon: Court Dorn PARAS, MD;  Location: [facility] INVAS[facility] CV LAB;  Service: Cardiovascular;  Laterality: N/A;   FOREARM FRACTURE SURGERY Right 1977   FRACTURE SURGERY     LAPAROSCOPY N/A 05/04/2022   Procedure: LAPAROSCOPY DIAGNOSTIC;  Surgeon: Rubin Calamity, MD;  Location: [facility] OR;  Service: General;  Laterality: N/A;   LAPAROTOMY N/A 05/04/2022   Procedure: EXPLORATORY LAPAROTOMY;  Surgeon: Rubin Calamity, MD;  Location: [facility] OR;  Service: General;  Laterality: N/A;   LEFT HEART CATH AND CORONARY ANGIOGRAPHY N/A 06/30/2017   Procedure: LEFT HEART CATH AND CORONARY ANGIOGRAPHY;  Surgeon: Court Dorn PARAS, MD;  Location: [facility] INV[facility] CV LAB;  Service: Cardiovascular;  Laterality: N/A;   LEFT HEART CATH AND CORONARY ANGIOGRAPHY N/A 12/23/2017   Procedure: LEFT HEART CATH AND CORONARY ANGIOGRAPHY;  Surgeon: Court Dorn PARAS, MD;  Location: [facility] INV[facility] CV LAB;  Service: Cardiovascular;  Laterality: N/A;   PERIPHERAL VASCULAR INTERVENTION  07/05/2022   Procedure: PERIPHERAL VASCULAR INTERVENTION;  Surgeon: Gretta Lonni PARAS, MD;  Location: [facility] LAB;  Service: Cardiovascular;;  SMA   PERIPHERAL VASCULAR THROMBECTOMY N/A 07/05/2022   Procedure: PERIPHERAL VASCULAR THROMBECTOMY;  Surgeon: Gretta Lonni PARAS, MD;  Location: [facility] INVAS[facility] CV LAB;  Service: Cardiovascular;  Laterality: N/A;  SMA   TUBAL LIGATION  1990   VEIN HARVEST  01/04/2023   Procedure: VEIN HARVEST RIGHT GREATER SAPHENOUS;  Surgeon: Gretta Lonni PARAS, MD;  Location: [facility] OR;  Service:  Vascular;;    ALLERGIES: Allergies  Allergen Reactions   Penicillins Itching and Rash    Caused rash and blisters   Hydrocodone  Hives and Itching    FAMILY HISTORY: Family History  Problem Relation Age of Onset   Diabetes Mother    Stroke Mother 1   Hypertension Mother    Hyperlipidemia Father    Stroke Father 31   Hypertension Father    Breast cancer Paternal Grandmother     SOCIAL HISTORY: Social History   Tobacco Use   Smoking status: Former    Average packs/day: 1 pack/day for 29.0 years (29.0 ttl pk-yrs)    Types: Cigarettes    Start date: 03/19/1989    Quit date: 03/19/2018    Years since quitting: 5.2   Smokeless tobacco: Never   Tobacco comments:    11/09/2021 Patient smokes some days  started back in 2023  Vaping Use   Vaping status: Never Used  Substance Use Topics   Alcohol use: Not Currently    Comment: /no   Drug use: Never   Social History   Social History Narrative   Are you right handed or left handed? right   Are you currently employed ?    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 befriend   Who lives with you?    What type of home do you live in: 1 story or 2 story? two        Objective:  Vital Signs:  BP 136/66 (BP Location: Left Arm)   Pulse 75   Ht 5' 5 (1.651 m)   Wt 197 lb (89.4 kg)   SpO2 98%   BMI 32.78 kg/m   General: General appearance: Awake and alert. No distress. Cooperative with exam.  Skin: No obvious rash or jaundice. HEENT: Atraumatic. Anicteric. Lungs: Non-labored breathing on room air  Extremities: No edema. No obvious deformity.  Musculoskeletal: No obvious joint swelling.  Neurological: Mental Status: Alert. Speech fluent. No pseudobulbar affect Cranial Nerves: CNII: No RAPD. Visual fields intact. CNIII, IV, VI: PERRL. No nystagmus. EOMI. CN V: Facial sensation intact bilaterally to fine touch. CN VII: Facial muscles symmetric and strong. No  ptosis at rest. CN VIII: Hears finger rub well bilaterally. CN IX: No hypophonia. CN X: Palate elevates symmetrically. CN XI: Full strength shoulder shrug bilaterally. CN XII: Tongue protrusion full and midline. No atrophy or fasciculations. No significant dysarthria  Motor: Tone is normal. Giveway weakness throughout. 5/5 in bilateral upper and lower extremities with best effort.  Reflexes:  Right Left  Bicep 2+ 2+  Tricep 2+ 2+  BrRad 2+ 2+  Knee 2+ 2+  Ankle 2+ 2+   Sensation: Pinprick: Intact in all extremities Vibration: Intact in all extremities Proprioception: Intact in bilateral great toes Coordination: Intact finger-to- nose-finger bilaterally. Romberg negative. Gait: Normal, narrow-based gait.   Lab and Test Review: New results: 01/05/23: CMP unremarkable CBC significant for chronic anemia (Hb 10.4), MCV 85.4  12/14/22: B1: 6 HbA1c: 6.4  Previously reviewed results: 06/15/22: B1: < 6 IFE: no M protein   Lipid panel (07/10/22): LDL 72  B12 (04/12/22): 1241 Folate (04/12/22): wnl ANA (04/12/22): negative ESR (04/12/22): wnl   MRI brain and cervical spine wo contrast (09/05/22): FINDINGS: MRI HEAD FINDINGS   Brain: No acute infarction, hemorrhage, hydrocephalus, extra-axial collection or mass lesion.   Vascular: Major arterial flow voids are maintained at the skull base.   Skull and upper cervical spine: Normal marrow signal.   Sinuses/Orbits: Clear sinuses.  No acute orbital findings.   Other: No mastoid effusions.   MRI CERVICAL SPINE FINDINGS   Alignment: No substantial sagittal subluxation.   Vertebrae: No fracture, evidence of discitis, or bone lesion.   Cord: Normal cord signal.   Posterior Fossa, vertebral arteries, paraspinal tissues: Visualized vertebral artery flow voids are maintained.   Disc levels:   C2-C3: No significant disc protrusion, foraminal stenosis, or canal stenosis.   C3-C4: Small central disc protrusion and right  greater than left facet arthropathy without significant stenosis.   C4-C5: Bilateral facet and uncovertebral hypertrophy with moderate bilateral foraminal stenosis. Mild canal stenosis.   C5-C6: Posterior disc osteophyte complex with left greater than right facet and uncovertebral hypertrophy. Resulting moderate to severe left foraminal stenosis. No significant canal stenosis.   C6-C7: Left greater than right facet arthropathy. Mild left foraminal stenosis. Patent canal and right foramen.   C7-T1: No significant disc protrusion, foraminal stenosis, or canal stenosis.   IMPRESSION: MRI head:   Normal brain MRI for patient age.  No acute abnormality.   MRI cervical spine:   1. At C5-C6, moderate to severe left foraminal stenosis. 2. At C4-C5, moderate bilateral foraminal stenosis and mild canal stenosis. 3. At C6-C7, mild left foraminal stenosis.   EMG (09/03/22): NCV & EMG Findings: Extensive electrodiagnostic evaluation of the right upper and lower limbs with additional nerve conduction studies of the left lower limb shows: Bilateral sural and superficial peroneal/fibular sensory responses are absent. Right median, ulnar, and radial sensory responses are within normal limits. Bilateral tibial (AH), right peroneal/fibular (EDB), right median (APB), and right ulnar (ADM) motor responses are within normal limits. Right H reflex latency is within normal limits. Chronic motor axon loss changes without active denervation changes are seen in the right tibialis anterior and medial head of gastrocnemius muscles. All other tested muscles are normal with normal motor unit configuration and recruitment patterns.   Impression: This is an abnormal study. The findings are most consistent with the following: Evidence of a large fiber sensorimotor polyneuropathy, axon loss in type, mild in degree electrically. No electrodiagnostic evidence of a right cervical (C5-C8) or right lumbosacral (L3-S1)  motor radiculopathy. No electrodiagnostic evidence of a right median mononeuropathy at or distal to the wrist consistent with carpal tunnel syndrome.   MRI lumbar spine wo contrast (03/01/21): FINDINGS: Segmentation:  Standard.   Alignment:  Physiologic.   Vertebrae: No fracture, evidence of  discitis, or suspicious bone lesion. There are a few scattered small intraosseous hemangiomas incidentally noted.   Conus medullaris and cauda equina: Conus extends to the L1 level. Conus and cauda equina appear normal.   Paraspinal and other soft tissues: Negative.   Disc levels:   T12-L1: No significant disc protrusion, foraminal stenosis, or canal stenosis.   L1-L2: No significant disc protrusion, foraminal stenosis, or canal stenosis.   L2-L3: Minimal annular disc bulge. Mild bilateral facet arthropathy. No foraminal or canal stenosis.   L3-L4: Diffuse disc bulge with left paracentral/foraminal protrusion. Slight cranial extension of disc material within the subarticular zone. Mild bilateral facet arthropathy. Findings result in mild canal stenosis with mild left subarticular recess stenosis and borderline-mild left foraminal stenosis.   L4-L5: No disc protrusion. Mild bilateral facet arthropathy. No foraminal or canal stenosis.   L5-S1: Shallow central disc protrusion. Mild bilateral facet arthropathy. No foraminal or canal stenosis.   IMPRESSION: Mild degenerative changes of the lumbar spine, as described above. Findings are most pronounced at L3-L4 where there is mild canal stenosis, mild left subarticular recess stenosis, and borderline-mild left foraminal stenosis.  ASSESSMENT: [name], a 58 y.o. female with a distal symmetric polyneuropathy, mild in degree on EMG. Her B1 was very low (thiamine deficiency). Her HbA1c was recently 6.4, falling in the pre-DM range. Both of these could contribute to neuropathy. She denies EtOH use or malnutrition/losing weight  but her GI issues could contribute to malabsorption of vitamins/nutrients. While there was no radiculopathy by EMG, recent MRI cervical spine showed moderate to severe left foraminal stenosis, which could also contribute/cause symptoms in her arms.  Plan: -Blood work: B1, B6, B12, Folate, MMA, vit D -Gabapentin  600 mg in morning and 900 mg in evening -Lidocaine  cream PRN -Alpha lipoic acid 600 mg once or twice daily  Return to clinic in 6 months  Total time spent reviewing records, interview, history/exam, documentation, and coordination of care on day of encounter:  35 min  Venetia Potters, MD

## 2023-06-21 ENCOUNTER — Other Ambulatory Visit: Payer: Commercial Managed Care - PPO

## 2023-06-21 ENCOUNTER — Ambulatory Visit (INDEPENDENT_AMBULATORY_CARE_PROVIDER_SITE_OTHER): Payer: Commercial Managed Care - PPO | Admitting: Neurology

## 2023-06-21 ENCOUNTER — Encounter: Payer: Self-pay | Admitting: Neurology

## 2023-06-21 VITALS — BP 136/66 | HR 75 | Ht 65.0 in | Wt 197.0 lb

## 2023-06-21 DIAGNOSIS — E519 Thiamine deficiency, unspecified: Secondary | ICD-10-CM

## 2023-06-21 DIAGNOSIS — G8929 Other chronic pain: Secondary | ICD-10-CM

## 2023-06-21 DIAGNOSIS — M542 Cervicalgia: Secondary | ICD-10-CM

## 2023-06-21 DIAGNOSIS — M545 Low back pain, unspecified: Secondary | ICD-10-CM

## 2023-06-21 DIAGNOSIS — M5412 Radiculopathy, cervical region: Secondary | ICD-10-CM

## 2023-06-21 DIAGNOSIS — R200 Anesthesia of skin: Secondary | ICD-10-CM

## 2023-06-21 DIAGNOSIS — R7303 Prediabetes: Secondary | ICD-10-CM

## 2023-06-21 DIAGNOSIS — G629 Polyneuropathy, unspecified: Secondary | ICD-10-CM

## 2023-06-21 DIAGNOSIS — R202 Paresthesia of skin: Secondary | ICD-10-CM

## 2023-06-21 MED ORDER — GABAPENTIN 300 MG PO CAPS
ORAL_CAPSULE | ORAL | 3 refills | Status: DC
Start: 2023-06-21 — End: 2024-04-17

## 2023-06-21 NOTE — Patient Instructions (Addendum)
 I want to get blood work today to see how the vitamins are doing and see if this is what is behind your pain getting worse.  We will increase your gabapentin  600 mg in the morning and 900 mg in the evening. I sent a new prescription to your pharmacy.  Alpha lipoic acid 600mg  daily has some research data suggesting it helps with nerve health. No major side effects other than <1% of people report upset stomach. This can be taken twice per day (1200mg  daily) if no relief obtained. You can buy this over the counter or online.  You can also try Lidocaine  cream as needed. Apply wear you have pain, tingling, or burning. Wear gloves to prevent your hands being numb. This can be bought over the counter at any drug store or online.  I will be in touch when I have your lab results.  Please let me know if you have any questions or concerns in the meantime.  The physicians and staff at [facility] are committed to providing excellent care. You may receive a survey requesting feedback about your experience at our office. We strive to receive very good responses to the survey questions. If you feel that your experience would prevent you from giving the office a very good  response, please contact our office to try to remedy the situation. We may be reached at [phone]. Thank you for taking the time out of your busy day to complete the survey.  Venetia Potters, MD [facility]

## 2023-06-24 NOTE — Progress Notes (Signed)
 Patient name: [name] MRN: [id number] DOB: 1966-01-03 Sex: female  REASON FOR CONSULT:3 month interval follow-up after open retrograde mesenteric bypass  HPI: [name] is a 58 y.o. female, that presents for ongoing interval 3 month follow-up after direct open retrograde revascularization for chronic mesenteric ischemia with occluded SMA stent.  On 01/04/2023 she underwent retrograde left common iliac artery to superior mesenteric artery bypass using great saphenous vein.  She did well postoperatively and was discharged.    Today she is complaining of ongoing shortness of breath, chronic lower back pain, abdominal pain.  However she is able to eat.  Her reflux is better with Protonix .  She initially underwent percutaneous mechanical thrombectomy of the SMA with angioplasty and stenting on 05/03/2022 for acute mesenteric ischemia with SMA thrombus.  She did require ex lap with all viable bowel but had evidence of hemoperitoneum with no bleeding source identified by general surgery. She had a slow hospital course but ultimately was discharged home on aspirin  and Plavix .     I subsequently sent her for CTA abdomen pelvis given her slow clinical progress.  Her CTA 06/23/22 showed recurrent thrombus in the SMA stent with associated thrombus in her visceral aorta as demonstrated on previous imaging.  I started her on Eliquis .  I took her back to the Cath Lab on 07/05/2022 for percutaneous thrombectomy of the SMA stent and extension of the SMA stent into the abdominal aorta. Her SMA stent occluded again as proved on CTA from 12/04/22.   Past Medical History:  Diagnosis Date   Anemia    Chronic mesenteric ischemia (HCC)    Fibroid    Hyperlipidemia    Hypertension    Neuromuscular disorder (HCC)    Neuropathy   NSTEMI (non-ST elevated myocardial infarction) (HCC) 06/2017   s/p DES to LCX 07/02/17   Sarcoidosis     Past Surgical History:  Procedure Laterality Date   ABDOMINAL  AORTIC ANEURYSM REPAIR N/A 01/04/2023   Procedure: OPEN MESENTERIC BYPASS left common iliac to SMA, lysis of adhesions;  Surgeon: Gretta Lonni PARAS, MD;  Location[facility] OR;  Service: Vascular;  Laterality: N/A;   ABDOMINAL AORTOGRAM N/A 07/05/2022   Procedure: ABDOMINAL AORTOGRAM;  Surgeon: Gretta Lonni PARAS, MD;  Location: [facility] INVASIVE CV LAB;  Service: Cardiovascular;  Laterality: N/A;   ABDOMINAL HYSTERECTOMY     BREAST BIOPSY Right 03/24/2018   BREAST REDUCTION SURGERY Bilateral 10/17/2020   Procedure: MAMMARY REDUCTION  (BREAST);  Surgeon: Elisabeth Craig RAMAN, MD;  Location: [facility] OR;  Service: Plastics;  Laterality: Bilateral;  2 hours   CESAREAN SECTION  1987   CORONARY/GRAFT ACUTE MI REVASCULARIZATION N/A 06/30/2017   Procedure: Coronary/Graft Acute MI Revascularization;  Surgeon: Court Dorn PARAS, MD;  Location: [facility] INVASIVE CV LAB;  Service: Cardiovascular;  Laterality: N/A;   FOREARM FRACTURE SURGERY Right 1977   FRACTURE SURGERY     LAPAROSCOPY N/A 05/04/2022   Procedure: LAPAROSCOPY DIAGNOSTIC;  Surgeon: Rubin Calamity, MD;  Location: [facility] OR;  Service: General;  Laterality: N/A;   LAPAROTOMY N/A 05/04/2022   Procedure: EXPLORATORY LAPAROTOMY;  Surgeon: Rubin Calamity, MD;  Location: [facility] OR;  Service: General;  Laterality: N/A;   LEFT HEART CATH AND CORONARY ANGIOGRAPHY N/A 06/30/2017   Procedure: LEFT HEART CATH AND CORONARY ANGIOGRAPHY;  Surgeon: Court Dorn PARAS, MD;  Location: [facility] INVASIVE CV LAB;  Service: Cardiovascular;  Laterality: N/A;   LEFT HEART CATH AND CORONARY ANGIOGRAPHY N/A 12/23/2017   Procedure: LEFT HEART CATH AND  CORONARY ANGIOGRAPHY;  Surgeon: Court Dorn PARAS, MD;  Location: [facility] INVASIVE CV LAB;  Service: Cardiovascular;  Laterality: N/A;   PERIPHERAL VASCULAR INTERVENTION  07/05/2022   Procedure: PERIPHERAL VASCULAR INTERVENTION;  Surgeon: Gretta Lonni PARAS, MD;  Location: [facility] INVASIVE CV LAB;  Service: Cardiovascular;;  SMA   PERIPHERAL VASCULAR THROMBECTOMY N/A  07/05/2022   Procedure: PERIPHERAL VASCULAR THROMBECTOMY;  Surgeon: Gretta Lonni PARAS, MD;  Location: [facility] INVASIVE CV LAB;  Service: Cardiovascular;  Laterality: N/A;  SMA   TUBAL LIGATION  1990   VEIN HARVEST  01/04/2023   Procedure: VEIN HARVEST RIGHT GREATER SAPHENOUS;  Surgeon: Gretta Lonni PARAS, MD;  Location: [facility] OR;  Service: Vascular;;    Family History  Problem Relation Age of Onset   Diabetes Mother    Stroke Mother 40   Hypertension Mother    Hyperlipidemia Father    Stroke Father 58   Hypertension Father    Breast cancer Paternal Grandmother     SOCIAL HISTORY: Social History   Socioeconomic History   Marital status: Widowed    Spouse name: Not on file   Number of children: 2   Years of education: Not on file   Highest education level: Not on file  Occupational History   Occupation: Quarry Manager: [employer]  Tobacco Use   Smoking status: Former    Average packs/day: 1 pack/day for 29.0 years (29.0 ttl pk-yrs)    Types: Cigarettes    Start date: 03/19/1989    Quit date: 03/19/2018    Years since quitting: 5.2   Smokeless tobacco: Never   Tobacco comments:    11/09/2021 Patient smokes some days  started back in 2023  Vaping Use   Vaping status: Never Used  Substance and Sexual Activity   Alcohol use: Not Currently    Comment: /no   Drug use: Never   Sexual activity: Not on file  Other Topics Concern   Not on file  Social History Narrative   Are you right handed or left handed? right   Are you currently employed ?    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 befriend   Who lives with you?    What type of home do you live in: 1 story or 2 story? two       Social Drivers of Corporate Investment Banker Strain: Not on file  Food Insecurity: No Food Insecurity (05/04/2022)   Hunger Vital Sign    Worried About Running Out of Food in the Last Year: Never true    Ran Out  of Food in the Last Year: Never true  Transportation Needs: No Transportation Needs (05/04/2022)   PRAPARE - Administrator, Civil Service (Medical): No    Lack of Transportation (Non-Medical): No  Physical Activity: Not on file  Stress: Not on file  Social Connections: Not on file  Intimate Partner Violence: Not At Risk (05/04/2022)   Humiliation, Afraid, Rape, and Kick questionnaire    Fear of Current or Ex-Partner: No    Emotionally Abused: No    Physically Abused: No    Sexually Abused: No    Allergies  Allergen Reactions   Penicillins Itching and Rash    Caused rash and blisters   Hydrocodone  Hives and Itching    Current Outpatient Medications  Medication Sig Dispense Refill   acetaminophen  (TYLENOL ) 500 MG tablet Take  1,000 mg by mouth every 6 (six) hours as needed for moderate pain.     aspirin  EC 81 MG tablet Take 1 tablet (81 mg total) by mouth daily. Swallow whole. 30 tablet    atorvastatin  (LIPITOR ) 40 MG tablet Take 1 tablet (40 mg total) by mouth daily. 30 tablet 11   clopidogrel  (PLAVIX ) 75 MG tablet Take 1 tablet (75 mg total) by mouth daily with breakfast. 30 tablet 11   cyclobenzaprine  (FLEXERIL ) 5 MG tablet Take 1 tablet (5 mg total) by mouth 3 (three) times daily as needed for muscle spasms. 40 tablet 0   gabapentin  (NEURONTIN ) 300 MG capsule Take 2 capsules (600 mg) in the morning and take 3 capsules (900 mg) at bedtime 450 capsule 3   metoprolol  succinate (TOPROL -XL) 25 MG 24 hr tablet Take 25 mg by mouth daily.     ondansetron  (ZOFRAN ) 4 MG tablet Take 1 tablet (4 mg total) by mouth every 8 (eight) hours as needed for nausea or vomiting. 30 tablet 0   oxyCODONE  (OXY IR/ROXICODONE ) 5 MG immediate release tablet Take 1-2 tablets (5-10 mg total) by mouth every 6 (six) hours as needed for moderate pain or severe pain. (Patient not taking: Reported on 06/21/2023) 30 tablet 0   thiamine (VITAMIN B-1) 100 MG tablet Take 100 mg by mouth daily.     No  current facility-administered medications for this visit.    REVIEW OF SYSTEMS:  [X]  denotes positive finding, [ ]  denotes negative finding Cardiac  Comments:  Chest pain or chest pressure:    Shortness of breath upon exertion:    Short of breath when lying flat:    Irregular heart rhythm:        Vascular    Pain in calf, thigh, or hip brought on by ambulation:    Pain in feet at night that wakes you up from your sleep:     Blood clot in your veins:    Leg swelling:         Pulmonary    Oxygen at home:    Productive cough:     Wheezing:         Neurologic    Sudden weakness in arms or legs:     Sudden numbness in arms or legs:     Sudden onset of difficulty speaking or slurred speech:    Temporary loss of vision in one eye:     Problems with dizziness:         Gastrointestinal    Blood in stool:     Vomited blood:     Abdominal pain    Genitourinary    Burning when urinating:     Blood in urine:        Psychiatric    Major depression:         Hematologic    Bleeding problems:    Problems with blood clotting too easily:        Skin    Rashes or ulcers:        Constitutional    Fever or chills:      PHYSICAL EXAM: There were no vitals filed for this visit.   GENERAL: The patient is a well-nourished female, in no acute distress. The vital signs are documented above. CARDIAC: There is a regular rate and rhythm.  VASCULAR: Midline incision healed  No significant rebound or guarding, abdomen soft Bilateral DP palpable PULMONARY: No respiratory distress. MUSCULOSKELETAL: There are no major deformities or cyanosis. NEUROLOGIC: No  focal weakness or paresthesias are detected. PSYCHIATRIC: The patient has a normal affect.  DATA:   Duplex today shows her mesenteric bypass remains patent from the left common iliac artery to the SMA.  Assessment/Plan:   58 y.o. female, that presents for interval 3 month follow-up after direct open retrograde revascularization  for chronic mesenteric ischemia with occluded SMA stent.  On 01/04/2023 she underwent retrograde left common iliac artery to superior mesenteric artery bypass using great saphenous vein.   Duplex today again shows her left common iliac to SMA bypass is patent.  Velocities are similar to 3 months ago.  I discussed she needs to remain on aspirin  Plavix .  I discussed smoking cessation.  She is optimized from our standpoint.  Her midline incision is healed nicely and her abdomen is soft.  Still does not feel she can go back to work.  Has a multitude of complaints including shortness of breath, chronic back pain etc.  I discussed that she needs to file her long-term disability with her PCP after discussion with our office manager.  From my standpoint she is optimized and has adequate mesenteric flow.  I will see her in 6 months with repeat duplex.    Lonni DOROTHA Gaskins, MD [facility] Office: [phone]

## 2023-06-25 ENCOUNTER — Ambulatory Visit (HOSPITAL_COMMUNITY)
Admission: RE | Admit: 2023-06-25 | Discharge: 2023-06-25 | Disposition: A | Discharge status: Home / Self Care | Payer: Commercial Managed Care - PPO | Source: Ambulatory Visit | Attending: Vascular Surgery | Admitting: Vascular Surgery

## 2023-06-25 ENCOUNTER — Encounter: Payer: Self-pay | Admitting: Vascular Surgery

## 2023-06-25 ENCOUNTER — Ambulatory Visit (INDEPENDENT_AMBULATORY_CARE_PROVIDER_SITE_OTHER): Payer: Commercial Managed Care - PPO | Admitting: Vascular Surgery

## 2023-06-25 VITALS — BP 139/82 | HR 74 | Temp 98.3°F | Resp 20 | Ht 65.0 in | Wt 195.6 lb

## 2023-06-25 DIAGNOSIS — K559 Vascular disorder of intestine, unspecified: Secondary | ICD-10-CM | POA: Diagnosis present

## 2023-06-25 DIAGNOSIS — K551 Chronic vascular disorders of intestine: Secondary | ICD-10-CM | POA: Diagnosis not present

## 2023-06-27 ENCOUNTER — Encounter: Payer: Self-pay | Admitting: Neurology

## 2023-06-27 LAB — VITAMIN B6: Vitamin B6: 2.3 ng/mL (ref 2.1–21.7)

## 2023-06-27 LAB — VITAMIN D 25 HYDROXY (VIT D DEFICIENCY, FRACTURES): Vit D, 25-Hydroxy: 23 ng/mL — ABNORMAL LOW (ref 30–100)

## 2023-06-27 LAB — FOLATE: Folate: 4.1 ng/mL — ABNORMAL LOW

## 2023-06-27 LAB — VITAMIN B12: Vitamin B-12: 380 pg/mL (ref 200–1100)

## 2023-06-27 LAB — METHYLMALONIC ACID, SERUM: Methylmalonic Acid, Quant: 72 nmol/L (ref 55–335)

## 2023-06-27 LAB — VITAMIN B1: Vitamin B1 (Thiamine): 26 nmol/L (ref 8–30)

## 2023-07-02 ENCOUNTER — Other Ambulatory Visit: Payer: Self-pay

## 2023-07-02 DIAGNOSIS — K559 Vascular disorder of intestine, unspecified: Secondary | ICD-10-CM

## 2023-07-15 ENCOUNTER — Encounter: Payer: Self-pay | Admitting: Student

## 2023-07-15 ENCOUNTER — Ambulatory Visit: Payer: Commercial Managed Care - PPO | Attending: Student | Admitting: Student

## 2023-07-15 VITALS — BP 130/78 | HR 68 | Ht 64.5 in | Wt 198.6 lb

## 2023-07-15 DIAGNOSIS — I739 Peripheral vascular disease, unspecified: Secondary | ICD-10-CM | POA: Diagnosis not present

## 2023-07-15 DIAGNOSIS — I2510 Atherosclerotic heart disease of native coronary artery without angina pectoris: Secondary | ICD-10-CM | POA: Diagnosis not present

## 2023-07-15 DIAGNOSIS — R0789 Other chest pain: Secondary | ICD-10-CM

## 2023-07-15 DIAGNOSIS — R0602 Shortness of breath: Secondary | ICD-10-CM

## 2023-07-15 DIAGNOSIS — I10 Essential (primary) hypertension: Secondary | ICD-10-CM

## 2023-07-15 DIAGNOSIS — E785 Hyperlipidemia, unspecified: Secondary | ICD-10-CM

## 2023-07-15 MED ORDER — PANTOPRAZOLE SODIUM 40 MG PO TBEC: 40.0000 mg | DELAYED_RELEASE_TABLET | Freq: Every day | ORAL | 6 refills | Status: DC

## 2023-07-15 NOTE — Progress Notes (Signed)
Cardiology Office Note:    Date:  07/15/2023   ID:  [name], DOB [birth year], MRN [id number]  PCP:  Caffie Damme, MD  Cardiologist:  Nanetta Batty, MD     Referring MD: Caffie Damme, MD   Chief Complaint: chest pain and shortness of breath  History of Present Illness:    [name] is a 58 y.o. African-American female with a history of NSTEMI in 06/2017 s/p DES to LCX, PAD with aortic thrombus and superior mesenteric artery thrombosis in 04/2022 s/p SMA stenting  with subsequent thrombectomy, angioplasty, and stenting for in-stent restenosis in 06/2022 and then retrograde left common iliac artery to SMA bypass in 12/2022 due to occluded SMA stent followed by Vascular Surgery, pulmonary sarcoidosis, hypertension, hyperlipidemia, and anemia who is followed by Dr. Allyson Sabal and presents today for evaluation of chest pain and shortness of breath.  Last ischemic evaluation was a left cardiac catheterization in 12/2017 which showed a widely patent LCX stent and otherwise only 25% stenosis of proximal LAD. Last Echo in 11/2021 showed LVEF of 60-65% with mild LVH and grade 1 diastolic dysfunction, normal RV size and function, and mild MR. Last mesenteric ultrasound in 06/2023 showed patent left common iliac artery to SMA bypass graft but elevated velocities noted at the proximal anastomosis and distal graft.   Patient presents today for follow-up. She states she has not been doing well since her most recent vascular surgery in 12/2022. She reports shortness of breath and chest pain since then. She reports atypical chest pain that she describes as a "catch" sensation, almost like a "pinched nerve." She also describes some right sided chest pain that improves with movement of her right arm. She is having this right sided chest pain in the office today. It is reproducible with palpation and improves with passive ROM of her arm. This sounds like musculoskeletal chest pain but she she is worried because  it feels like the pain she had at the time of NSTEMI.  She also reports shortness of breath both at rest and with exertion. She also describes difficulty sleeping at night which she attributes to multiple things include "acid reflux issues." However, she also describes some orthopnea and possible PND. She reports some swelling in her hands and feet that is worse in the morning and improves throughout the day. She states she just feels very tired and "yucky." She is only sleeping about 3 hours a night. She looks very tired today and has a very flat affect. She describes some lightheadedness if she stands too quickly but no palpitations, falls, or syncope.  EKGs/Labs/Other Studies Reviewed:    The following studies were reviewed:  Left Cardiac Catheterization 12/23/2017: Previously placed Prox Cx to Mid Cx stent (unknown type) is widely patent. Prox LAD lesion is 25% stenosed. There is mild left ventricular systolic dysfunction. The left ventricular ejection fraction is 50-55% by visual estimate.  Impressions: Ms. Tweedy has a patent mid AV groove circumflex stent.  Her distal circumflex is widely patent.  Her proximal LAD is at most 25 to 30% stenosed, less severe than on her initial cath 6 months ago and her RCA is widely patent.  Her LV function is preserved although she does have an apical wall motion abnormality which is unchanged from her initial cath.  She has no culprit lesions.  I believe her chest pain is noncardiac.  I deployed a MYNX closure device successfully achieving hemostasis.  The patient left the lab in stable condition.  Diagnostic Dominance: Right  _______________  Echocardiogram 11/17/2021: Impressions: 1. Left ventricular ejection fraction, by estimation, is 60 to 65%. Left  ventricular ejection fraction by 3D volume is 63 %. The left ventricle has  normal function. The left ventricle has no regional wall motion  abnormalities. There is mild left  ventricular  hypertrophy. Left ventricular diastolic parameters are  consistent with Grade I diastolic dysfunction (impaired relaxation).   2. Right ventricular systolic function is normal. The right ventricular  size is normal.   3. The mitral valve is normal in structure. Mild mitral valve  regurgitation. No evidence of mitral stenosis.   4. The aortic valve is normal in structure. Aortic valve regurgitation is  not visualized. No aortic stenosis is present.   5. The inferior vena cava is normal in size with greater than 50%  respiratory variability, suggesting right atrial pressure of 3 mmHg.   Comparison(s): EF 55%, mild LVH.  _______________  Mesenteric Ultrasound 06/25/2023: Summary:  Mesenteric:  Patent left common iliac artery to SMA bypass graft. Elevated velocities  noted at the proximal anastomosis and distal graft. Unable to adequately  evaluate SMA secondary to technical limitations.    EKG:  EKG ordered today.   EKG Interpretation Date/Time:  Monday July 15 2023 11:07:48 EST Ventricular Rate:  68 PR Interval:  182 QRS Duration:  72 QT Interval:  364 QTC Calculation: 387 R Axis:   14  Text Interpretation: Normal sinus rhythm Low voltage QRS T wave inversions in leads III and aVF as well as V5-V6 No significant changes compared ot prior tracings Confirmed by Marjie Skiff [phone]) on 07/15/2023 11:15:28 AM     Recent Labs: 01/05/2023: ALT 17; Magnesium 1.4 01/09/2023: Hemoglobin 9.2; Platelets 263 01/11/2023: BUN <5; Creatinine, Ser 0.70; Potassium 3.5; Sodium 136  Recent Lipid Panel    Component Value Date/Time   CHOL 129 07/10/2022 1017   TRIG 80 07/10/2022 1017   HDL 41 07/10/2022 1017   CHOLHDL 3.1 07/10/2022 1017   CHOLHDL 4.1 07/01/2017 0326   VLDL 17 07/01/2017 0326   LDLCALC 72 07/10/2022 1017    Physical Exam:    Vital Signs: BP 130/78 (BP Location: Left Arm, Patient Position: Sitting)   Pulse 68   Ht 5' 4.5" (1.638 m)   Wt 198 lb 9.6 oz (90.1 kg)    BMI 33.56 kg/m     Wt Readings from Last 3 Encounters:  07/15/23 198 lb 9.6 oz (90.1 kg)  06/25/23 195 lb 9.6 oz (88.7 kg)  06/21/23 197 lb (89.4 kg)     General: 58 y.o. African-American female in no acute distress. HEENT: Normocephalic and atraumatic. Sclera clear.  Neck: Supple. No carotid bruits. No JVD. Heart: RRR. Distinct S1 and S2. No murmurs, gallops, or rubs. Pin-point right sided chest pain with palpation. Lungs: No increased work of breathing. Clear to ausculation bilaterally. No wheezes, rhonchi, or rales.  Abdomen: Soft, non-distended, and non-tender to palpation.  Extremities: No lower extremity edema.   Skin: Warm and dry. Neuro: No focal deficits. Psych: Normal affect. Responds appropriately.  Assessment:    1. Atypical chest pain   2. SOB (shortness of breath)   3. Coronary artery disease involving native coronary artery of native heart without angina pectoris   4. PAD (peripheral artery disease) (HCC)   5. Primary hypertension   6. Hyperlipidemia LDL goal <70     Plan:    Atypical Chest Pain  Dyspnea on Exertion CAD History of NSTEMI in 06/2017 s/p DES  to LCX. Last LHC in 12/2017 showed patent LCX stent and otherwise only mild LAD disease.  - Patient reports pretty constant chest pain and shortness of breath since most recent vascular surgery in 12/2022. She also reports feeling very fatigued. - EKG shows no acute ischemic changes.  - On DAPT with Aspirin and Plavix for PAD. - Continue beta-blocker and statin. - Chest pain sounds very atypical. She initially told me the chest pain had been since 12/2022 and that it felt similar to the prior pain she had at the time of her MI. Therefore, I initially planned to get a STAT troponin and Lexiscan Myoview. We were unable to get any blood work today as patient was very dehydrated. Upon further review of chart, it looks like she was complaining of atypical chest pain at last visit with Dr. Allyson Sabal in 06/2022. I spoke with  patient again after lab draw was attempted and she said this is the same chest pain she has had since then.  It is reproducible on exam and sound consistent with musculoskeletal pain. Therefore, will cancel the STAT troponin and Myoview. Will have her increase fluid intake today and have her come back for labs (CBC, BMET, BNP, TSH) tomorrow. Will also update an Echo. She describes some problems with acid reflux so will also start Protonix 40mg  daily to see if that helps. Will have patient follow-up with me after these tests. Also advised patient to follow-up with PCP for non-cardiac chest pain.  Of note, patient states she was previously diagnosed with pulmonary sarcoidosis. She does not follow with Pulmonology for this and has not had any recent chest CTs.Therefore, if Echo and above lab work is unremarkable, consider high-resolution CT.   PAD Mesenteric Ischemia S/p multiple interventions of SMA. Most recently underwent retrograde left common iliac artery to SMA bypass in 12/2022 due to occluded SMA stent. Mesenteric ultrasound in 06/2023 showed patent left common iliac artery to SMA bypass graft but elevated velocities noted at the proximal anastomosis and distal graft.  - Continue DAPT with Aspirin and Plavix.  - Continue statin.  - Followed by Vascular Surgery.   Hypertension BP well controlled.  - Continue   Hyperlipidemia - Continue Lipitor 40mg  daily.   Disposition: Follow up in 1-2 months after Echo.   Signed, Corrin Parker, PA-C  07/15/2023 12:58 PM    [facility]

## 2023-07-15 NOTE — Patient Instructions (Addendum)
Medication Instructions:  START PROTONIX 40MG  DAILY *If you need a refill on your cardiac medications before your next appointment, please call your pharmacy*  Lab Work: BNP, BMET, CBC AND TSH TODAY If you have labs (blood work) drawn today and your tests are completely normal, you will receive your results only by:  MyChart Message (if you have MyChart) OR  A paper copy in the mail If you have any lab test that is abnormal or we need to change your treatment, we will call you to review the results.  Testing/Procedures:  Your physician has requested that you have an echocardiogram-SEE BELOW  Follow-Up: At [facility], you and your health needs are our priority.  As part of our continuing mission to provide you with exceptional heart care, we have created designated Provider Care Teams.  These Care Teams include your primary Cardiologist (physician) and Advanced Practice Providers (APPs -  Physician Assistants and Nurse Practitioners) who all work together to provide you with the care you need, when you need it.  Your next appointment:   AFTER TESTING   Provider:   Nanetta Batty, MD  or Marjie Skiff, PA-C        Other Instructions Your physician has requested that you have an echocardiogram. Echocardiography is a painless test that uses sound waves to create images of your heart. It provides your doctor with information about the size and shape of your heart and how well your heart's chambers and valves are working. This procedure takes approximately one hour. There are no restrictions for this procedure. Please do NOT wear cologne, perfume, aftershave, or lotions (deodorant is allowed). Please arrive 15 minutes prior to your appointment time.  Please note: We ask at that you not bring children with you during ultrasound (echo/ vascular) testing. Due to room size and safety concerns, children are not allowed in the ultrasound rooms during exams. Our front office staff cannot  provide observation of children in our lobby area while testing is being conducted. An adult accompanying a patient to their appointment will only be allowed in the ultrasound room at the discretion of the ultrasound technician under special circumstances. We apologize for any inconvenience.

## 2023-07-17 LAB — CBC
Hematocrit: 41.1 % (ref 34.0–46.6)
Hemoglobin: 13.5 g/dL (ref 11.1–15.9)
MCH: 28 pg (ref 26.6–33.0)
MCHC: 32.8 g/dL (ref 31.5–35.7)
MCV: 85 fL (ref 79–97)
Platelets: 381 10*3/uL (ref 150–450)
RBC: 4.82 x10E6/uL (ref 3.77–5.28)
RDW: 14.8 % (ref 11.7–15.4)
WBC: 7 10*3/uL (ref 3.4–10.8)

## 2023-07-17 LAB — BASIC METABOLIC PANEL
BUN/Creatinine Ratio: 10 (ref 9–23)
BUN: 8 mg/dL (ref 6–24)
CO2: 20 mmol/L (ref 20–29)
Calcium: 10.1 mg/dL (ref 8.7–10.2)
Chloride: 107 mmol/L — ABNORMAL HIGH (ref 96–106)
Creatinine, Ser: 0.78 mg/dL (ref 0.57–1.00)
Glucose: 101 mg/dL — ABNORMAL HIGH (ref 70–99)
Potassium: 4.2 mmol/L (ref 3.5–5.2)
Sodium: 141 mmol/L (ref 134–144)
eGFR: 89 mL/min/{1.73_m2} (ref >59)

## 2023-07-17 LAB — BRAIN NATRIURETIC PEPTIDE: BNP: 33.7 pg/mL (ref 0.0–100.0)

## 2023-07-17 LAB — TSH: TSH: 1.68 u[IU]/mL (ref 0.450–4.500)

## 2023-08-01 ENCOUNTER — Ambulatory Visit (HOSPITAL_COMMUNITY): Payer: Commercial Managed Care - PPO | Attending: Cardiology

## 2023-08-01 DIAGNOSIS — R0602 Shortness of breath: Secondary | ICD-10-CM | POA: Insufficient documentation

## 2023-08-01 DIAGNOSIS — R0789 Other chest pain: Secondary | ICD-10-CM | POA: Insufficient documentation

## 2023-08-01 LAB — ECHOCARDIOGRAM COMPLETE
Area-P 1/2: 3.37 cm2
S' Lateral: 3 cm

## 2023-08-16 ENCOUNTER — Ambulatory Visit: Payer: Commercial Managed Care - PPO | Attending: Cardiovascular Disease | Admitting: Cardiovascular Disease

## 2023-08-16 ENCOUNTER — Encounter: Payer: Self-pay | Admitting: Cardiovascular Disease

## 2023-08-16 VITALS — BP 131/73 | HR 84 | Ht 64.5 in | Wt 205.0 lb

## 2023-08-16 DIAGNOSIS — Z720 Tobacco use: Secondary | ICD-10-CM

## 2023-08-16 DIAGNOSIS — I2510 Atherosclerotic heart disease of native coronary artery without angina pectoris: Secondary | ICD-10-CM | POA: Diagnosis not present

## 2023-08-16 DIAGNOSIS — E785 Hyperlipidemia, unspecified: Secondary | ICD-10-CM

## 2023-08-16 NOTE — Patient Instructions (Addendum)
 Medication Instructions:  Your physician recommends that you continue on your current medications as directed. Please refer to the Current Medication list given to you today.  *If you need a refill on your cardiac medications before your next appointment, please call your pharmacy*   Lab Work: Your physician recommends that you have labs drawn today: Lipid/live panel  If you have labs (blood work) drawn today and your tests are completely normal, you will receive your results only by: MyChart Message (if you have MyChart) OR A paper copy in the mail If you have any lab test that is abnormal or we need to change your treatment, we will call you to review the results.   Follow-Up: At [facility], you and your health needs are our priority.  As part of our continuing mission to provide you with exceptional heart care, we have created designated Provider Care Teams.  These Care Teams include your primary Cardiologist (physician) and Advanced Practice Providers (APPs -  Physician Assistants and Nurse Practitioners) who all work together to provide you with the care you need, when you need it.  We recommend signing up for the patient portal called "MyChart".  Sign up information is provided on this After Visit Summary.  MyChart is used to connect with patients for Virtual Visits (Telemedicine).  Patients are able to view lab/test results, encounter notes, upcoming appointments, etc.  Non-urgent messages can be sent to your provider as well.   To learn more about what you can do with MyChart, go to [email or web address].    Your next appointment:   6 month(s)  Provider:   Marjie Skiff, PA-C       Then, Nanetta Batty, MD will plan to see you again in 12 month(s).   Other Instructions   1st Floor: - Lobby - Registration  - Pharmacy  - Lab - Cafe  2nd Floor: - PV Lab - Diagnostic Testing (echo, CT, nuclear med)  3rd Floor: - Vacant  4th Floor: - TCTS  (cardiothoracic surgery) - AFib Clinic - Structural Heart Clinic - Vascular Surgery  - Vascular Ultrasound  5th Floor: - [facility] (general and EP) - Clinical Pharmacy for coumadin, hypertension, lipid, weight-loss medications, and med management appointments    Valet parking services will be available as well.

## 2023-08-16 NOTE — Progress Notes (Signed)
 08/16/2023 [name]   1965/08/29  [id number]  Primary Physician Caffie Damme, MD Primary Cardiologist: Runell Gess MD Nicholes Calamity, [place]  HPI:  [name] is a 58 y.o.    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  I last saw her in the office 07/10/2022.Marland Kitchen  She had a non-STEMI and occurred on 06/30/17 status post PCI and drug-eluting stenting of the proximal AV groove circumflex. She did have a 70% proximal LAD lesion which did I did not think was physiologically significant. 2-D echo performed 07/01/17 showed normal LV function. She does have a history of sarcoidosisas well. She has long history of tobacco abuse having smoked a pack a day for 30 years currently smoking 4 cigarettes a day. She continues to have atypical constant chest pain.   She had a routine GXT which was abnormal and subsequent Myoview stress test that showed inferolateral scar without ischemia was likely from her circumflex infarct.  She did have a repeat cardiac catheterization performed 12/23/2017 because of chest pain that showed patent stents with otherwise normal coronary arteries. She has somewhat reduce her tobacco intake down to several cigarettes a day.    I told her that she can go back to work.   She had acute mesenteric ischemia 05/03/2022 and underwent thrombectomy and SMA stenting.  This was redone by Dr. Chestine Spore 07/05/2022.  Since that time, she has had open revascularization.  Since I saw her a year ago she did stop smoking in June of last year.  She did have a recent 2D echocardiogram performed 08/01/2023 which was essentially normal with grade 1 diastolic dysfunction.  She denies chest pain or shortness of breath.   Current Meds  Medication Sig   acetaminophen (TYLENOL) 500 MG tablet Take 1,000 mg by mouth every 6 (six) hours as needed for  moderate pain (pain score 4-6).   albuterol (VENTOLIN HFA) 108 (90 Base) MCG/ACT inhaler Inhale 2 puffs into the lungs 4 (four) times daily as needed.   aspirin EC 81 MG tablet Take 1 tablet (81 mg total) by mouth daily. Swallow whole.   atorvastatin (LIPITOR) 40 MG tablet Take 1 tablet (40 mg total) by mouth daily.   clopidogrel (PLAVIX) 75 MG tablet Take 1 tablet (75 mg total) by mouth daily with breakfast.   cyclobenzaprine (FLEXERIL) 5 MG tablet Take 1 tablet (5 mg total) by mouth 3 (three) times daily as needed for muscle spasms.   doxycycline (VIBRA-TABS) 100 MG tablet Take 100 mg by mouth 2 (two) times daily.   gabapentin (NEURONTIN) 300 MG capsule Take 2 capsules (600 mg) in the morning and take 3 capsules (900 mg) at bedtime   metoprolol succinate (TOPROL-XL) 25 MG 24 hr tablet Take 25 mg by mouth daily.   pantoprazole (PROTONIX) 40 MG tablet Take 1 tablet (40 mg total) by mouth daily.   thiamine (VITAMIN B-1) 100 MG tablet Take 100 mg by mouth daily.     Allergies  Allergen Reactions   Penicillins Itching and Rash    Caused rash and blisters   Hydrocodone Hives and Itching    Social History   Socioeconomic History   Marital status: Widowed    Spouse name: Not on file   Number of children: 2   Years of education: Not on file   Highest education  level: Not on file  Occupational History   Occupation: Quarry manager: Company secretary  Tobacco Use   Smoking status: Every Day    Average packs/day: 1 pack/day for 29.0 years (29.0 ttl pk-yrs)    Types: Cigarettes    Start date: 03/19/1989    Last attempt to quit: 03/19/2018    Years since quitting: 5.4   Smokeless tobacco: Never   Tobacco comments:    07/15/2023 payient smokes a pack of cigarettes every two days    11/09/2021 Patient smokes some days  started back in 2023  Vaping Use   Vaping status: Never Used  Substance and Sexual Activity   Alcohol use: Not Currently    Comment: /no   Drug use:  Never   Sexual activity: Not on file  Other Topics Concern   Not on file  Social History Narrative   Are you right handed or left handed? right   Are you currently employed ?    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 befriend   Who lives with you?    What type of home do you live in: 1 story or 2 story? two       Social Drivers of Corporate investment banker Strain: Not on file  Food Insecurity: No Food Insecurity (05/04/2022)   Hunger Vital Sign    Worried About Running Out of Food in the Last Year: Never true    Ran Out of Food in the Last Year: Never true  Transportation Needs: No Transportation Needs (05/04/2022)   PRAPARE - Administrator, Civil Service (Medical): No    Lack of Transportation (Non-Medical): No  Physical Activity: Not on file  Stress: Not on file  Social Connections: Not on file  Intimate Partner Violence: Not At Risk (05/04/2022)   Humiliation, Afraid, Rape, and Kick questionnaire    Fear of Current or Ex-Partner: No    Emotionally Abused: No    Physically Abused: No    Sexually Abused: No     Review of Systems: General: negative for chills, fever, night sweats or weight changes.  Cardiovascular: negative for chest pain, dyspnea on exertion, edema, orthopnea, palpitations, paroxysmal nocturnal dyspnea or shortness of breath Dermatological: negative for rash Respiratory: negative for cough or wheezing Urologic: negative for hematuria Abdominal: negative for nausea, vomiting, diarrhea, bright red blood per rectum, melena, or hematemesis Neurologic: negative for visual changes, syncope, or dizziness All other systems reviewed and are otherwise negative except as noted above.    Blood pressure 131/73, pulse 84, height 5' 4.5" (1.638 m), weight 205 lb (93 kg), SpO2 99%.  General appearance: alert and no distress Neck: no adenopathy, no carotid bruit, no JVD, supple, symmetrical, trachea  midline, and thyroid not enlarged, symmetric, no tenderness/mass/nodules Lungs: clear to auscultation bilaterally Heart: regular rate and rhythm, S1, S2 normal, no murmur, click, rub or gallop Extremities: extremities normal, atraumatic, no cyanosis or edema Pulses: 2+ and symmetric Skin: Skin color, texture, turgor normal. No rashes or lesions Neurologic: Grossly normal  EKG not performed today      ASSESSMENT AND PLAN:   CAD (coronary artery disease) History of CAD status post non-STEMI 06/30/2017.  I performed PCI drug-eluting stenting of the AV groove circumflex.  She did have 70% proximal LAD lesion which that I did not think was physiologically significant.  She had a routine GXT which  was abnormal and a subsequent Myoview stress test that showed inferolateral scar without ischemia likely secondary to her circumflex infarct.  I did repeat her cardiac catheterization 12/23/2017 showing a widely patent circumflex stent without other significant CAD.  She denies chest pain.  Hyperlipidemia with target LDL less than 70 History of hyperlipidemia on atorvastatin 40 mg.  She says that she recently picked up a prescription for 80 mg for unclear reasons.  Lipid profile performed 07/10/2022 revealed total cholesterol 129, LDL 72 and HDL 41.  We will repeat a lipid liver profile.  Tobacco abuse Discontinue tobacco abuse June 2024.  Acute mesenteric ischemia (HCC) History of acute mesenteric ischemia 05/03/2022 status post thrombectomy and SMA stenting followed by Dr. Chestine Spore.  She has had open revascularization since.     Runell Gess MD FACP,FACC,FAHA, [facility] 08/16/2023 12:03 PM

## 2023-08-16 NOTE — Assessment & Plan Note (Signed)
 History of CAD status post non-STEMI 06/30/2017.  I performed PCI drug-eluting stenting of the AV groove circumflex.  She did have 70% proximal LAD lesion which that I did not think was physiologically significant.  She had a routine GXT which was abnormal and a subsequent Myoview stress test that showed inferolateral scar without ischemia likely secondary to her circumflex infarct.  I did repeat her cardiac catheterization 12/23/2017 showing a widely patent circumflex stent without other significant CAD.  She denies chest pain.

## 2023-08-16 NOTE — Assessment & Plan Note (Signed)
 Discontinue tobacco abuse June 2024.

## 2023-08-16 NOTE — Assessment & Plan Note (Signed)
 History of hyperlipidemia on atorvastatin 40 mg.  She says that she recently picked up a prescription for 80 mg for unclear reasons.  Lipid profile performed 07/10/2022 revealed total cholesterol 129, LDL 72 and HDL 41.  We will repeat a lipid liver profile.

## 2023-08-16 NOTE — Assessment & Plan Note (Signed)
 History of acute mesenteric ischemia 05/03/2022 status post thrombectomy and SMA stenting followed by Dr. Chestine Spore.  She has had open revascularization since.

## 2023-09-01 ENCOUNTER — Other Ambulatory Visit: Payer: Self-pay | Admitting: Physician Assistant

## 2023-12-10 NOTE — Progress Notes (Deleted)
 I saw [name] in neurology clinic on 12/20/23 in follow up for neuropathy.  HPI: [name] is a 58 y.o. year old female with a history of pre-DM, sarcoidosis, HLD, CAD c/b NSTEMI s/p stent, PVD c/b recent SMA thrombus s/p angioplasty and stent (04/2022), former smoker who we last saw on 06/21/23.  To briefly review: 06/15/22: Patient has numbness and tingling in bilateral feet to her knees. She thinks the right foot may be worse than the left. The inside of her feet will feel cold or hot. This has been present for at least 6 months. She mentions she can no longer walk in heels due to pain. She denies weakness in legs, imbalance, or falls. She sometimes has numbness/pain in the hands as well. Patient was seen at [facility] by Dr. Silva. She was started on gabapentin  300 mg qhs. It helps her sleep and the pain at night. She finds that the next morning the gabapentin  can leave her feeling tired and off balance. She has been on this for a few weeks.   She endorses cramps in her legs, particularly at night, that can wake her up.   She denies any constitutional symptoms like fever, night sweats, anorexia or unintentional weight loss.   EtOH use: Very rare Restrictive diet? No Family history of neuropathy/myopathy/neurologic disease? No   12/14/22: Patient drives a city bus. She sits all day and has noticed a lot of swelling in her legs. She has shooting pain and burning in her feet. She has great difficulty with pain, especially at night.    IFE showed no M protein. B1 was < 6. I recommended supplementation with B1 100 mg daily. Patient has been taking. HbA1c was not collected.   EMG on 09/03/22 was consistent with a mild axonal length dependent polyneuropathy. No abnormalities of the right upper limb were found.   Her gabapentin  was increased to 300 mg BID on 09/03/22 as well. This has helped some, but not enough.   Patient presented to the ED on 09/05/22 for left  arm pain, numbness, and facial paresthesia (?for 4 days) feeling like she slept on her arm wrong. MRI brain was normal. MRI cervical spine showed moderate to severe left C5-6 foraminal stenosis and moderate bilateral C4-5 foraminal stenosis. Patient was given prednisone  and NSGY outpatient follow up. She continues to have this pain. She has not seen NSGY. She has not done any physical therapy.   Patient again denies EtOH. She denies restrictive diet or weight loss.  Gabapentin  was increased on 12/14/22 to 300 mg/600 mg.  06/21/23: She has continued pain. Her hands and feet are swollen every morning. Her feet can feel very hot then cold. She has increased her gabapentin  to 600 mg BID. Her symptoms are frustrating her and keeping her up at night.    Abdominal pain and surgery for lysis of adhesions and SMA bypass in 12/2022. She has been out of work since then. She is asking about disability today.   HbA1c was 6.4, consistent with pre-DM. B1 was still very low. I again recommended B1 supplementation. Patient states she is taking B1 100 mg every night. She had just started at last clinic visit.   EtOH use: very rare, 1-2 times per year Restrictive diet: No  Most recent Assessment and Plan (06/21/23): [name], a 58 y.o. female with a distal symmetric polyneuropathy, mild in degree on EMG. Her B1 was very low (thiamine deficiency).  Her HbA1c was recently 6.4, falling in the pre-DM range. Both of these could contribute to neuropathy. She denies EtOH use or malnutrition/losing weight but her GI issues could contribute to malabsorption of vitamins/nutrients. While there was no radiculopathy by EMG, recent MRI cervical spine showed moderate to severe left foraminal stenosis, which could also contribute/cause symptoms in her arms.   Plan: -Blood work: B1, B6, B12, Folate, MMA, vit D -Gabapentin  600 mg in morning and 900 mg in evening -Lidocaine  cream PRN -Alpha lipoic acid 600 mg once or  twice daily  Since their last visit: Labs were significant for folate and vit D deficiency. I recommended supplementation.  Taking supplements (B1, folate, vit D?)  ROS: Pertinent positive and negative systems reviewed in HPI. ***   MEDICATIONS:  Outpatient Encounter Medications as of 12/20/2023  Medication Sig   acetaminophen  (TYLENOL ) 500 MG tablet Take 1,000 mg by mouth every 6 (six) hours as needed for moderate pain (pain score 4-6).   albuterol  (VENTOLIN  HFA) 108 (90 Base) MCG/ACT inhaler Inhale 2 puffs into the lungs 4 (four) times daily as needed.   aspirin  EC 81 MG tablet Take 1 tablet (81 mg total) by mouth daily. Swallow whole.   atorvastatin  (LIPITOR ) 40 MG tablet Take 1 tablet (40 mg total) by mouth daily.   clopidogrel  (PLAVIX ) 75 MG tablet TAKE 1 TABLET(75 MG) BY MOUTH DAILY WITH BREAKFAST   cyclobenzaprine  (FLEXERIL ) 5 MG tablet Take 1 tablet (5 mg total) by mouth 3 (three) times daily as needed for muscle spasms.   doxycycline (VIBRA-TABS) 100 MG tablet Take 100 mg by mouth 2 (two) times daily.   gabapentin  (NEURONTIN ) 300 MG capsule Take 2 capsules (600 mg) in the morning and take 3 capsules (900 mg) at bedtime   metoprolol  succinate (TOPROL -XL) 25 MG 24 hr tablet Take 25 mg by mouth daily.   ondansetron  (ZOFRAN ) 4 MG tablet Take 1 tablet (4 mg total) by mouth every 8 (eight) hours as needed for nausea or vomiting. (Patient not taking: Reported on 08/16/2023)   oxyCODONE  (OXY IR/ROXICODONE ) 5 MG immediate release tablet Take 1-2 tablets (5-10 mg total) by mouth every 6 (six) hours as needed for moderate pain or severe pain. (Patient not taking: Reported on 06/21/2023)   pantoprazole  (PROTONIX ) 40 MG tablet Take 1 tablet (40 mg total) by mouth daily.   thiamine (VITAMIN B-1) 100 MG tablet Take 100 mg by mouth daily.   No facility-administered encounter medications on file as of 12/20/2023.    PAST MEDICAL HISTORY: Past Medical History:  Diagnosis Date   Anemia    Chronic  mesenteric ischemia (HCC)    Fibroid    Hyperlipidemia    Hypertension    Neuromuscular disorder (HCC)    Neuropathy   NSTEMI (non-ST elevated myocardial infarction) (HCC) 06/2017   s/p DES to LCX 07/02/17   Sarcoidosis     PAST SURGICAL HISTORY: Past Surgical History:  Procedure Laterality Date   ABDOMINAL AORTIC ANEURYSM REPAIR N/A 01/04/2023   Procedure: OPEN MESENTERIC BYPASS left common iliac to SMA, lysis of adhesions;  Surgeon: Gretta Lonni PARAS, MD;  Location: [facility] OR;  Service: Vascular;  Laterality: N/A;   ABDOMINAL AORTOGRAM N/A 07/05/2022   Procedure: ABDOMINAL AORTOGRAM;  Surgeon: Gretta Lonni PARAS, MD;  Location: [facility] INVASIVE CV LAB;  Service: Cardiovascular;  Laterality: N/A;   ABDOMINAL HYSTERECTOMY     BREAST BIOPSY Right 03/24/2018   BREAST REDUCTION SURGERY Bilateral 10/17/2020   Procedure: MAMMARY REDUCTION  (BREAST);  Surgeon: Elisabeth,  Craig RAMAN, MD;  Location: [facility] OR;  Service: Plastics;  Laterality: Bilateral;  2 hours   CESAREAN SECTION  1987   CORONARY/GRAFT ACUTE MI REVASCULARIZATION N/A 06/30/2017   Procedure: Coronary/Graft Acute MI Revascularization;  Surgeon: Court Dorn PARAS, MD;  Location: [facility] INVAS[facility] LAB;  Service: Cardiovascular;  Laterality: N/A;   FOREARM FRACTURE SURGERY Right 1977   FRACTURE SURGERY     LAPAROSCOPY N/A 05/04/2022   Procedure: LAPAROSCOPY DIAGNOSTIC;  Surgeon: Rubin Calamity, MD;  Location: [facility] OR;  Service: General;  Laterality: N/A;   LAPAROTOMY N/A 05/04/2022   Procedure: EXPLORATORY LAPAROTOMY;  Surgeon: Rubin Calamity, MD;  Location: [facility] OR;  Service: General;  Laterality: N/A;   LEFT HEART CATH AND CORONARY ANGIOGRAPHY N/A 06/30/2017   Procedure: LEFT HEART CATH AND CORONARY ANGIOGRAPHY;  Surgeon: Court Dorn PARAS, MD;  Location: [facility];  Service: Cardiovascular;  Laterality: N/A;   LEFT HEART CATH AND CORONARY ANGIOGRAPHY N/A 12/23/2017   Procedure: LEFT HEART CATH AND CORONARY ANGIOGRAPHY;  Surgeon: Court Dorn PARAS, MD;  Location: [facility];  Service: Cardiovascular;  Laterality: N/A;   PERIPHERAL VASCULAR INTERVENTION  07/05/2022   Procedure: PERIPHERAL VASCULAR INTERVENTION;  Surgeon: Gretta Lonni PARAS, MD;  Location: [facility];  Service: Cardiovascular;;  SMA   PERIPHERAL VASCULAR THROMBECTOMY N/A 07/05/2022   Procedure: PERIPHERAL VASCULAR THROMBECTOMY;  Surgeon: Gretta Lonni PARAS, MD;  Location: [facility];  Service: Cardiovascular;  Laterality: N/A;  SMA   TUBAL LIGATION  1990   VEIN HARVEST  01/04/2023   Procedure: VEIN HARVEST RIGHT GREATER SAPHENOUS;  Surgeon: Gretta Lonni PARAS, MD;  Location: [facility] OR;  Service: Vascular;;    ALLERGIES: Allergies  Allergen Reactions   Penicillins Itching and Rash    Caused rash and blisters   Hydrocodone  Hives and Itching    FAMILY HISTORY: Family History  Problem Relation Age of Onset   Diabetes Mother    Stroke Mother 59   Hypertension Mother    Hyperlipidemia Father    Stroke Father 30   Hypertension Father    Breast cancer Paternal Grandmother     SOCIAL HISTORY: Social History   Tobacco Use   Smoking status: Every Day    Average packs/day: 1 pack/day for 29.0 years (29.0 ttl pk-yrs)    Types: Cigarettes    Start date: 03/19/1989    Last attempt to quit: 03/19/2018    Years since quitting: 5.7   Smokeless tobacco: Never   Tobacco comments:    07/15/2023 payient smokes a pack of cigarettes every two days    11/09/2021 Patient smokes some days  started back in 2023  Vaping Use   Vaping status: Never Used  Substance Use Topics   Alcohol use: Not Currently    Comment: /no   Drug use: Never   Social History   Social History Narrative   Are you right handed or left handed? right   Are you currently employed ?    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 befriend   Who lives with you?    What type of home do you live in: 1 story or 2 story?  two        Objective:  Vital Signs:  There were no vitals taken for this visit.  General:*** General appearance: Awake and alert. No distress. Cooperative with exam.  Skin: No obvious rash or jaundice. HEENT:  Atraumatic. Anicteric. Lungs: Non-labored breathing on room air  Heart: Regular Abdomen: Soft, non tender. Extremities: No edema. No obvious deformity.  Musculoskeletal: No obvious joint swelling.  Neurological: Mental Status: Alert. Speech fluent. No pseudobulbar affect Cranial Nerves: CNII: No RAPD. Visual fields intact. CNIII, IV, VI: PERRL. No nystagmus. EOMI. CN V: Facial sensation intact bilaterally to fine touch. [place] clench strong. Jaw jerk***. CN VII: Facial muscles symmetric and strong. No ptosis at rest or after sustained upgaze***. CN VIII: Hears finger rub well bilaterally. CN IX: No hypophonia. CN X: Palate elevates symmetrically. CN XI: Full strength shoulder shrug bilaterally. CN XII: Tongue protrusion full and midline. No atrophy or fasciculations. No significant dysarthria*** Motor: Tone is ***. *** fasciculations in *** extremities. *** atrophy. No grip or percussive myotonia.  Individual muscle group testing (MRC grade out of 5):  Movement     Neck flexion ***    Neck extension ***     Right Left   Shoulder abduction *** ***   Shoulder adduction *** ***   Shoulder ext rotation *** ***   Shoulder int rotation *** ***   Elbow flexion *** ***   Elbow extension *** ***   Wrist extension *** ***   Wrist flexion *** ***   Finger abduction - FDI *** ***   Finger abduction - ADM *** ***   Finger extension *** ***   Finger distal flexion - 2/3 *** ***   Finger distal flexion - 4/5 *** ***   Thumb flexion - FPL *** ***   Thumb abduction - APB *** ***    Hip flexion *** ***   Hip extension *** ***   Hip adduction *** ***   Hip abduction *** ***   Knee extension *** ***   Knee flexion *** ***   Dorsiflexion *** ***   Plantarflexion  *** ***   Inversion *** ***   Eversion *** ***   Great toe extension *** ***   Great toe flexion *** ***     Reflexes:  Right Left  Bicep *** ***  Tricep *** ***  BrRad *** ***  Knee *** ***  Ankle *** ***   Pathological Reflexes: Babinski: *** response bilaterally*** Hoffman: *** Troemner: *** Pectoral: *** Palmomental: *** Facial: *** Midline tap: *** Sensation: Pinprick: *** Vibration: *** Temperature: *** Proprioception: *** Coordination: Intact finger-to- nose-finger and heel-to-shin bilaterally. Romberg negative.*** Gait: Able to rise from chair with arms crossed unassisted. Normal, narrow-based gait. Able to tandem walk. Able to walk on toes and heels.***   Lab and Test Review: New results: 06/21/23: B1: 26 B12: 380 MMA wnl B6 wnl Vit D: 23 Folate: 4.1  07/16/23: TSH wnl BMP unremarkable CBC unremarkable  Echocardiogram (08/01/23):  1. Left ventricular ejection fraction, by estimation, is 55 to 60%. The  left ventricle has normal function. The left ventricle has no regional  wall motion abnormalities. There is mild concentric left ventricular  hypertrophy. Left ventricular diastolic  parameters are consistent with Grade I diastolic dysfunction (impaired  relaxation). The average left ventricular global longitudinal strain is  -20.2 %. The global longitudinal strain is normal.   2. Right ventricular systolic function is normal. The right ventricular  size is normal. Tricuspid regurgitation signal is inadequate for assessing  PA pressure.   3. The mitral valve is normal in structure. Trivial mitral valve  regurgitation. No evidence of mitral stenosis.   4. The aortic valve is tricuspid. Aortic valve regurgitation is not  visualized. Aortic valve sclerosis  is present, with no evidence of aortic  valve stenosis.   5. The inferior vena cava is normal in size with greater than 50%  respiratory variability, suggesting right atrial pressure of 3 mmHg.    Previously reviewed results: 01/05/23: CMP unremarkable CBC significant for chronic anemia (Hb 10.4), MCV 85.4   12/14/22: B1: 6 HbA1c: 6.4   06/15/22: B1: < 6 IFE: no M protein   Lipid panel (07/10/22): LDL 72   B12 (04/12/22): 1241 Folate (04/12/22): wnl ANA (04/12/22): negative ESR (04/12/22): wnl   MRI brain and cervical spine wo contrast (09/05/22): FINDINGS: MRI HEAD FINDINGS   Brain: No acute infarction, hemorrhage, hydrocephalus, extra-axial collection or mass lesion.   Vascular: Major arterial flow voids are maintained at the skull base.   Skull and upper cervical spine: Normal marrow signal.   Sinuses/Orbits: Clear sinuses.  No acute orbital findings.   Other: No mastoid effusions.   MRI CERVICAL SPINE FINDINGS   Alignment: No substantial sagittal subluxation.   Vertebrae: No fracture, evidence of discitis, or bone lesion.   Cord: Normal cord signal.   Posterior Fossa, vertebral arteries, paraspinal tissues: Visualized vertebral artery flow voids are maintained.   Disc levels:   C2-C3: No significant disc protrusion, foraminal stenosis, or canal stenosis.   C3-C4: Small central disc protrusion and right greater than left facet arthropathy without significant stenosis.   C4-C5: Bilateral facet and uncovertebral hypertrophy with moderate bilateral foraminal stenosis. Mild canal stenosis.   C5-C6: Posterior disc osteophyte complex with left greater than right facet and uncovertebral hypertrophy. Resulting moderate to severe left foraminal stenosis. No significant canal stenosis.   C6-C7: Left greater than right facet arthropathy. Mild left foraminal stenosis. Patent canal and right foramen.   C7-T1: No significant disc protrusion, foraminal stenosis, or canal stenosis.   IMPRESSION: MRI head:   Normal brain MRI for patient age.  No acute abnormality.   MRI cervical spine:   1. At C5-C6, moderate to severe left foraminal stenosis. 2. At  C4-C5, moderate bilateral foraminal stenosis and mild canal stenosis. 3. At C6-C7, mild left foraminal stenosis.   EMG (09/03/22): NCV & EMG Findings: Extensive electrodiagnostic evaluation of the right upper and lower limbs with additional nerve conduction studies of the left lower limb shows: Bilateral sural and superficial peroneal/fibular sensory responses are absent. Right median, ulnar, and radial sensory responses are within normal limits. Bilateral tibial (AH), right peroneal/fibular (EDB), right median (APB), and right ulnar (ADM) motor responses are within normal limits. Right H reflex latency is within normal limits. Chronic motor axon loss changes without active denervation changes are seen in the right tibialis anterior and medial head of gastrocnemius muscles. All other tested muscles are normal with normal motor unit configuration and recruitment patterns.   Impression: This is an abnormal study. The findings are most consistent with the following: Evidence of a large fiber sensorimotor polyneuropathy, axon loss in type, mild in degree electrically. No electrodiagnostic evidence of a right cervical (C5-C8) or right lumbosacral (L3-S1) motor radiculopathy. No electrodiagnostic evidence of a right median mononeuropathy at or distal to the wrist consistent with carpal tunnel syndrome.   MRI lumbar spine wo contrast (03/01/21): FINDINGS: Segmentation:  Standard.   Alignment:  Physiologic.   Vertebrae: No fracture, evidence of discitis, or suspicious bone lesion. There are a few scattered small intraosseous hemangiomas incidentally noted.   Conus medullaris and cauda equina: Conus extends to the L1 level. Conus and cauda equina appear normal.   Paraspinal and other soft  tissues: Negative.   Disc levels:   T12-L1: No significant disc protrusion, foraminal stenosis, or canal stenosis.   L1-L2: No significant disc protrusion, foraminal stenosis, or canal stenosis.    L2-L3: Minimal annular disc bulge. Mild bilateral facet arthropathy. No foraminal or canal stenosis.   L3-L4: Diffuse disc bulge with left paracentral/foraminal protrusion. Slight cranial extension of disc material within the subarticular zone. Mild bilateral facet arthropathy. Findings result in mild canal stenosis with mild left subarticular recess stenosis and borderline-mild left foraminal stenosis.   L4-L5: No disc protrusion. Mild bilateral facet arthropathy. No foraminal or canal stenosis.   L5-S1: Shallow central disc protrusion. Mild bilateral facet arthropathy. No foraminal or canal stenosis.   IMPRESSION: Mild degenerative changes of the lumbar spine, as described above. Findings are most pronounced at L3-L4 where there is mild canal stenosis, mild left subarticular recess stenosis, and borderline-mild left foraminal stenosis.  ASSESSMENT: This is Marcos LITTIE Louder, a 58 y.o. female with:  ***  Plan: ***Genetic testing?  Return to clinic in ***  Total time spent reviewing records, interview, history/exam, documentation, and coordination of care on day of encounter:  *** min  Venetia Potters, MD

## 2023-12-20 ENCOUNTER — Ambulatory Visit: Payer: Commercial Managed Care - PPO | Admitting: Neurology

## 2023-12-30 NOTE — Progress Notes (Deleted)
 Patient name: [name] MRN: [id number] DOB: 1965-11-08 Sex: female  REASON FOR CONSULT: 6 month interval follow-up after open retrograde mesenteric bypass  HPI: [name] is a 58 y.o. female, that presents for ongoing interval 6 month follow-up after direct open retrograde revascularization for chronic mesenteric ischemia with occluded SMA stent.  On 01/04/2023 she underwent retrograde left common iliac artery to superior mesenteric artery bypass using great saphenous vein.  She did well postoperatively and was discharged.    Today she is complaining of ongoing shortness of breath, chronic lower back pain, abdominal pain.  However she is able to eat.  Her reflux is better with Protonix .  She initially underwent percutaneous mechanical thrombectomy of the SMA with angioplasty and stenting on 05/03/2022 for acute mesenteric ischemia with SMA thrombus.  She did require ex lap with all viable bowel but had evidence of hemoperitoneum with no bleeding source identified by general surgery. She had a slow hospital course but ultimately was discharged home on aspirin  and Plavix .     I subsequently sent her for CTA abdomen pelvis given her slow clinical progress.  Her CTA 06/23/22 showed recurrent thrombus in the SMA stent with associated thrombus in her visceral aorta as demonstrated on previous imaging.  I started her on Eliquis .  I took her back to the Cath Lab on 07/05/2022 for percutaneous thrombectomy of the SMA stent and extension of the SMA stent into the abdominal aorta. Her SMA stent occluded again as proved on CTA from 12/04/22.   Past Medical History:  Diagnosis Date   Anemia    Chronic mesenteric ischemia (HCC)    Fibroid    Hyperlipidemia    Hypertension    Neuromuscular disorder (HCC)    Neuropathy   NSTEMI (non-ST elevated myocardial infarction) (HCC) 06/2017   s/p DES to LCX 07/02/17   Sarcoidosis     Past Surgical History:  Procedure Laterality Date   ABDOMINAL  AORTIC ANEURYSM REPAIR N/A 01/04/2023   Procedure: OPEN MESENTERIC BYPASS left common iliac to SMA, lysis of adhesions;  Surgeon: Gretta Lonni PARAS, MD;  Location: [facility];  Service: Vascular;  Laterality: N/A;   ABDOMINAL AORTOGRAM N/A 07/05/2022   Procedure: ABDOMINAL AORTOGRAM;  Surgeon: Gretta Lonni PARAS, MD;  Location: [facility] INVASIVE CV LAB;  Service: Cardiovascular;  Laterality: N/A;   ABDOMINAL HYSTERECTOMY     BREAST BIOPSY Right 03/24/2018   BREAST REDUCTION SURGERY Bilateral 10/17/2020   Procedure: MAMMARY REDUCTION  (BREAST);  Surgeon: Elisabeth Craig RAMAN, MD;  Location: [facility] OR;  Service: Plastics;  Laterality: Bilateral;  2 hours   CESAREAN SECTION  1987   CORONARY/GRAFT ACUTE MI REVASCULARIZATION N/A 06/30/2017   Procedure: Coronary/Graft Acute MI Revascularization;  Surgeon: Court Dorn PARAS, MD;  Location: [facility] INVASIVE CV LAB;  Service: Cardiovascular;  Laterality: N/A;   FOREARM FRACTURE SURGERY Right 1977   FRACTURE SURGERY     LAPAROSCOPY N/A 05/04/2022   Procedure: LAPAROSCOPY DIAGNOSTIC;  Surgeon: Rubin Calamity, MD;  Location: [facility] OR;  Service: General;  Laterality: N/A;   LAPAROTOMY N/A 05/04/2022   Procedure: EXPLORATORY LAPAROTOMY;  Surgeon: Rubin Calamity, MD;  Location: [facility] OR;  Service: General;  Laterality: N/A;   LEFT HEART CATH AND CORONARY ANGIOGRAPHY N/A 06/30/2017   Procedure: LEFT HEART CATH AND CORONARY ANGIOGRAPHY;  Surgeon: Court Dorn PARAS, MD;  Location: [facility];  Service: Cardiovascular;  Laterality: N/A;   LEFT HEART CATH AND CORONARY ANGIOGRAPHY N/A 12/23/2017   Procedure: LEFT HEART CATH  AND CORONARY ANGIOGRAPHY;  Surgeon: Court Dorn PARAS, MD;  Location: [facility] INVASIVE CV LAB;  Service: Cardiovascular;  Laterality: N/A;   PERIPHERAL VASCULAR INTERVENTION  07/05/2022   Procedure: PERIPHERAL VASCULAR INTERVENTION;  Surgeon: Gretta Lonni PARAS, MD;  Location: [facility] INV[facility] LAB;  Service: Cardiovascular;;  SMA   PERIPHERAL VASCULAR THROMBECTOMY N/A  07/05/2022   Procedure: PERIPHERAL VASCULAR THROMBECTOMY;  Surgeon: Gretta Lonni PARAS, MD;  Location: [facility] IN[facility] LAB;  Service: Cardiovascular;  Laterality: N/A;  SMA   TUBAL LIGATION  1990   VEIN HARVEST  01/04/2023   Procedure: VEIN HARVEST RIGHT GREATER SAPHENOUS;  Surgeon: Gretta Lonni PARAS, MD;  Location: [facility] OR;  Service: Vascular;;    Family History  Problem Relation Age of Onset   Diabetes Mother    Stroke Mother 76   Hypertension Mother    Hyperlipidemia Father    Stroke Father 76   Hypertension Father    Breast cancer Paternal Grandmother     SOCIAL HISTORY: Social History   Socioeconomic History   Marital status: Widowed    Spouse name: Not on file   Number of children: 2   Years of education: Not on file   Highest education level: Not on file  Occupational History   Occupation: Engineer, mining     Employer: [employer]  Tobacco Use   Smoking status: Every Day    Average packs/day: 1 pack/day for 29.0 years (29.0 ttl pk-yrs)    Types: Cigarettes    Start date: 03/19/1989    Last attempt to quit: 03/19/2018    Years since quitting: 5.7   Smokeless tobacco: Never   Tobacco comments:    07/15/2023 payient smokes a pack of cigarettes every two days    11/09/2021 Patient smokes some days  started back in 2023  Vaping Use   Vaping status: Never Used  Substance and Sexual Activity   Alcohol use: Not Currently    Comment: /no   Drug use: Never   Sexual activity: Not on file  Other Topics Concern   Not on file  Social History Narrative   Are you right handed or left handed? right   Are you currently employed ?    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 befriend   Who lives with you?    What type of home do you live in: 1 story or 2 story? two       Social Drivers of Corporate investment banker Strain: Not on file  Food Insecurity: No Food Insecurity (05/04/2022)   Hunger Vital  Sign    Worried About Running Out of Food in the Last Year: Never true    Ran Out of Food in the Last Year: Never true  Transportation Needs: No Transportation Needs (05/04/2022)   PRAPARE - Administrator, Civil Service (Medical): No    Lack of Transportation (Non-Medical): No  Physical Activity: Not on file  Stress: Not on file  Social Connections: Not on file  Intimate Partner Violence: Not At Risk (05/04/2022)   Humiliation, Afraid, Rape, and Kick questionnaire    Fear of Current or Ex-Partner: No    Emotionally Abused: No    Physically Abused: No    Sexually Abused: No    Allergies  Allergen Reactions   Penicillins Itching and Rash    Caused rash and blisters   Hydrocodone  Hives and Itching  Current Outpatient Medications  Medication Sig Dispense Refill   acetaminophen  (TYLENOL ) 500 MG tablet Take 1,000 mg by mouth every 6 (six) hours as needed for moderate pain (pain score 4-6).     albuterol  (VENTOLIN  HFA) 108 (90 Base) MCG/ACT inhaler Inhale 2 puffs into the lungs 4 (four) times daily as needed.     aspirin  EC 81 MG tablet Take 1 tablet (81 mg total) by mouth daily. Swallow whole. 30 tablet    atorvastatin  (LIPITOR ) 40 MG tablet Take 1 tablet (40 mg total) by mouth daily. 30 tablet 11   clopidogrel  (PLAVIX ) 75 MG tablet TAKE 1 TABLET(75 MG) BY MOUTH DAILY WITH BREAKFAST 30 tablet 11   cyclobenzaprine  (FLEXERIL ) 5 MG tablet Take 1 tablet (5 mg total) by mouth 3 (three) times daily as needed for muscle spasms. 40 tablet 0   doxycycline (VIBRA-TABS) 100 MG tablet Take 100 mg by mouth 2 (two) times daily.     gabapentin  (NEURONTIN ) 300 MG capsule Take 2 capsules (600 mg) in the morning and take 3 capsules (900 mg) at bedtime 450 capsule 3   metoprolol  succinate (TOPROL -XL) 25 MG 24 hr tablet Take 25 mg by mouth daily.     ondansetron  (ZOFRAN ) 4 MG tablet Take 1 tablet (4 mg total) by mouth every 8 (eight) hours as needed for nausea or vomiting. (Patient not  taking: Reported on 08/16/2023) 30 tablet 0   oxyCODONE  (OXY IR/ROXICODONE ) 5 MG immediate release tablet Take 1-2 tablets (5-10 mg total) by mouth every 6 (six) hours as needed for moderate pain or severe pain. (Patient not taking: Reported on 06/21/2023) 30 tablet 0   pantoprazole  (PROTONIX ) 40 MG tablet Take 1 tablet (40 mg total) by mouth daily. 30 tablet 6   thiamine (VITAMIN B-1) 100 MG tablet Take 100 mg by mouth daily.     No current facility-administered medications for this visit.    REVIEW OF SYSTEMS:  [X]  denotes positive finding, [ ]  denotes negative finding Cardiac  Comments:  Chest pain or chest pressure:    Shortness of breath upon exertion:    Short of breath when lying flat:    Irregular heart rhythm:        Vascular    Pain in calf, thigh, or hip brought on by ambulation:    Pain in feet at night that wakes you up from your sleep:     Blood clot in your veins:    Leg swelling:         Pulmonary    Oxygen at home:    Productive cough:     Wheezing:         Neurologic    Sudden weakness in arms or legs:     Sudden numbness in arms or legs:     Sudden onset of difficulty speaking or slurred speech:    Temporary loss of vision in one eye:     Problems with dizziness:         Gastrointestinal    Blood in stool:     Vomited blood:     Abdominal pain    Genitourinary    Burning when urinating:     Blood in urine:        Psychiatric    Major depression:         Hematologic    Bleeding problems:    Problems with blood clotting too easily:        Skin    Rashes or ulcers:  Constitutional    Fever or chills:      PHYSICAL EXAM: There were no vitals filed for this visit.   GENERAL: The patient is a well-nourished female, in no acute distress. The vital signs are documented above. CARDIAC: There is a regular rate and rhythm.  VASCULAR: Midline incision healed  No significant rebound or guarding, abdomen soft Bilateral DP palpable PULMONARY:  No respiratory distress. MUSCULOSKELETAL: There are no major deformities or cyanosis. NEUROLOGIC: No focal weakness or paresthesias are detected. PSYCHIATRIC: The patient has a normal affect.  DATA:   Duplex today shows her mesenteric bypass remains patent from the left common iliac artery to the SMA.  Assessment/Plan:   58 y.o. female, that presents for interval 6 month follow-up after direct open retrograde revascularization for chronic mesenteric ischemia with occluded SMA stent.  On 01/04/2023 she underwent retrograde left common iliac artery to superior mesenteric artery bypass using great saphenous vein.   Duplex today again shows her left common iliac to SMA bypass is patent.  Velocities are similar to 3 months ago.  I discussed she needs to remain on aspirin  Plavix .  I discussed smoking cessation.  She is optimized from our standpoint.  Her midline incision is healed nicely and her abdomen is soft.  Still does not feel she can go back to work.  Has a multitude of complaints including shortness of breath, chronic back pain etc.  I discussed that she needs to file her long-term disability with her PCP after discussion with our office manager.  From my standpoint she is optimized and has adequate mesenteric flow.  I will see her in 6 months with repeat duplex.    Lonni DOROTHA Gaskins, MD [facility] Office: [phone]

## 2023-12-31 ENCOUNTER — Ambulatory Visit: Payer: Commercial Managed Care - PPO | Attending: Vascular Surgery | Admitting: Vascular Surgery

## 2023-12-31 ENCOUNTER — Ambulatory Visit (HOSPITAL_COMMUNITY): Payer: Commercial Managed Care - PPO | Attending: Vascular Surgery

## 2024-01-23 ENCOUNTER — Other Ambulatory Visit: Payer: Self-pay

## 2024-01-23 ENCOUNTER — Emergency Department (HOSPITAL_BASED_OUTPATIENT_CLINIC_OR_DEPARTMENT_OTHER)
Admission: EM | Admit: 2024-01-23 | Discharge: 2024-01-23 | Disposition: A | Discharge status: Home / Self Care | Attending: Emergency Medicine | Admitting: Emergency Medicine

## 2024-01-23 ENCOUNTER — Encounter (HOSPITAL_BASED_OUTPATIENT_CLINIC_OR_DEPARTMENT_OTHER): Payer: Self-pay

## 2024-01-23 ENCOUNTER — Emergency Department (HOSPITAL_BASED_OUTPATIENT_CLINIC_OR_DEPARTMENT_OTHER)

## 2024-01-23 DIAGNOSIS — R1011 Right upper quadrant pain: Secondary | ICD-10-CM | POA: Insufficient documentation

## 2024-01-23 DIAGNOSIS — I10 Essential (primary) hypertension: Secondary | ICD-10-CM | POA: Insufficient documentation

## 2024-01-23 DIAGNOSIS — K5900 Constipation, unspecified: Secondary | ICD-10-CM | POA: Insufficient documentation

## 2024-01-23 DIAGNOSIS — R110 Nausea: Secondary | ICD-10-CM | POA: Diagnosis not present

## 2024-01-23 DIAGNOSIS — Z7982 Long term (current) use of aspirin: Secondary | ICD-10-CM | POA: Diagnosis not present

## 2024-01-23 DIAGNOSIS — R1013 Epigastric pain: Secondary | ICD-10-CM | POA: Insufficient documentation

## 2024-01-23 DIAGNOSIS — R1033 Periumbilical pain: Secondary | ICD-10-CM | POA: Diagnosis not present

## 2024-01-23 DIAGNOSIS — I2510 Atherosclerotic heart disease of native coronary artery without angina pectoris: Secondary | ICD-10-CM | POA: Diagnosis not present

## 2024-01-23 DIAGNOSIS — Z7901 Long term (current) use of anticoagulants: Secondary | ICD-10-CM | POA: Diagnosis not present

## 2024-01-23 DIAGNOSIS — R109 Unspecified abdominal pain: Secondary | ICD-10-CM

## 2024-01-23 LAB — CBC WITH DIFFERENTIAL/PLATELET
Abs Immature Granulocytes: 0.02 K/uL (ref 0.00–0.07)
Basophils Absolute: 0.1 K/uL (ref 0.0–0.1)
Basophils Relative: 1 %
Eosinophils Absolute: 0.2 K/uL (ref 0.0–0.5)
Eosinophils Relative: 2 %
HCT: 41.1 % (ref 36.0–46.0)
Hemoglobin: 13.9 g/dL (ref 12.0–15.0)
Immature Granulocytes: 0 %
Lymphocytes Relative: 41 %
Lymphs Abs: 3.3 K/uL (ref 0.7–4.0)
MCH: 28.8 pg (ref 26.0–34.0)
MCHC: 33.8 g/dL (ref 30.0–36.0)
MCV: 85.3 fL (ref 80.0–100.0)
Monocytes Absolute: 0.5 K/uL (ref 0.1–1.0)
Monocytes Relative: 6 %
Neutro Abs: 4.1 K/uL (ref 1.7–7.7)
Neutrophils Relative %: 50 %
Platelets: 353 K/uL (ref 150–400)
RBC: 4.82 MIL/uL (ref 3.87–5.11)
RDW: 13.7 % (ref 11.5–15.5)
WBC: 8.1 K/uL (ref 4.0–10.5)
nRBC: 0 % (ref 0.0–0.2)

## 2024-01-23 LAB — COMPREHENSIVE METABOLIC PANEL WITH GFR
ALT: 24 U/L (ref 0–44)
AST: 28 U/L (ref 15–41)
Albumin: 3.8 g/dL (ref 3.5–5.0)
Alkaline Phosphatase: 190 U/L — ABNORMAL HIGH (ref 38–126)
Anion gap: 13 (ref 5–15)
BUN: 11 mg/dL (ref 6–20)
CO2: 20 mmol/L — ABNORMAL LOW (ref 22–32)
Calcium: 10.2 mg/dL (ref 8.9–10.3)
Chloride: 105 mmol/L (ref 98–111)
Creatinine, Ser: 0.84 mg/dL (ref 0.44–1.00)
GFR, Estimated: >60 mL/min (ref >60)
Glucose, Bld: 181 mg/dL — ABNORMAL HIGH (ref 70–99)
Potassium: 4 mmol/L (ref 3.5–5.1)
Sodium: 138 mmol/L (ref 135–145)
Total Bilirubin: 0.3 mg/dL (ref 0.0–1.2)
Total Protein: 6.5 g/dL (ref 6.5–8.1)

## 2024-01-23 LAB — URINALYSIS, ROUTINE W REFLEX MICROSCOPIC
Bilirubin Urine: NEGATIVE
Glucose, UA: 100 mg/dL — AB
Hgb urine dipstick: NEGATIVE
Ketones, ur: NEGATIVE mg/dL
Nitrite: NEGATIVE
Protein, ur: NEGATIVE mg/dL
Specific Gravity, Urine: 1.033 — ABNORMAL HIGH (ref 1.005–1.030)
pH: 5.5 (ref 5.0–8.0)

## 2024-01-23 LAB — LIPASE, BLOOD: Lipase: 39 U/L (ref 11–51)

## 2024-01-23 LAB — LACTIC ACID, PLASMA: Lactic Acid, Venous: 1.4 mmol/L (ref 0.5–1.9)

## 2024-01-23 MED ORDER — ALUM & MAG HYDROXIDE-SIMETH 200-200-20 MG/5ML PO SUSP
30.0000 mL | Freq: Once | ORAL | Status: AC
  Administered 2024-01-23: 30 mL via ORAL
  Filled 2024-01-23: qty 30

## 2024-01-23 MED ORDER — METOCLOPRAMIDE HCL 5 MG/ML IJ SOLN
10.0000 mg | Freq: Once | INTRAMUSCULAR | Status: AC
  Administered 2024-01-23: 10 mg via INTRAVENOUS
  Filled 2024-01-23: qty 2

## 2024-01-23 MED ORDER — HYOSCYAMINE SULFATE 0.125 MG SL SUBL
0.2500 mg | SUBLINGUAL_TABLET | Freq: Once | SUBLINGUAL | Status: AC
  Administered 2024-01-23: 0.25 mg via SUBLINGUAL
  Filled 2024-01-23: qty 2

## 2024-01-23 MED ORDER — HYOSCYAMINE SULFATE SL 0.125 MG SL SUBL: 1.0000 | SUBLINGUAL_TABLET | Freq: Three times a day (TID) | SUBLINGUAL | 0 refills | Status: AC | PRN

## 2024-01-23 MED ORDER — IOHEXOL 350 MG/ML SOLN
100.0000 mL | Freq: Once | INTRAVENOUS | Status: AC | PRN
  Administered 2024-01-23: 100 mL via INTRAVENOUS

## 2024-01-23 MED ORDER — HYDROMORPHONE HCL 1 MG/ML IJ SOLN
0.5000 mg | INTRAMUSCULAR | Status: DC | PRN
  Administered 2024-01-23 (×2): 0.5 mg via INTRAVENOUS
  Filled 2024-01-23 (×2): qty 1

## 2024-01-23 MED ORDER — LIDOCAINE VISCOUS HCL 2 % MT SOLN: 15.0000 mL | Freq: Once | OROMUCOSAL | Status: DC

## 2024-01-23 NOTE — ED Triage Notes (Signed)
 Chronic abdominal pain around incision that has been somewhat constant for the past year after surgery on her Superior Mesenteric Artery.    Also appears short of breath after ambulating and has difficulty speaking full sentences.

## 2024-01-23 NOTE — ED Provider Notes (Signed)
 [place] [facility] DEPARTMENT AT [facility] Provider Note  CSN: [id number] Arrival date & time: 01/23/24 0201  Chief Complaint(s) Abdominal Pain  HPI [name] is a 58 y.o. female with a past medical history listed below including mesenteric ischemia with an SMA stent complicated by clotting currently on Eliquis  here for exacerbation of chronic abdominal pain.  Patient reports that over the past 4 days, her pain has been fluctuating with more intensity.  Pain is worse with eating stating that she feels bloated.  Because of that she has had decreased appetite.  Endorses nausea without emesis.  No diarrhea.  Reports intermittent constipation with last bowel movement 2 days ago.  Still passing gas.  Denies any urinary symptoms.  Reports that pain feels similar to episode in January where she had restenosed her stent.  Denies any chest pain.  Reports shortness of breath related to pain intensity.  Also endorses chronic back pain without acute changes.  The history is provided by the patient.    Past Medical History Past Medical History:  Diagnosis Date   Anemia    Chronic mesenteric ischemia (HCC)    Fibroid    Hyperlipidemia    Hypertension    Neuromuscular disorder (HCC)    Neuropathy   NSTEMI (non-ST elevated myocardial infarction) (HCC) 06/2017   s/p DES to LCX 07/02/17   Sarcoidosis    Patient Active Problem List   Diagnosis Date Noted   Mesenteric ischemia (HCC) 01/04/2023   Chronic mesenteric ischemia (HCC) 12/18/2022   Occlusion of superior mesenteric artery (HCC) 12/18/2022   S/P laparotomy 05/04/2022   Observation after surgery 05/03/2022   Acute mesenteric ischemia (HCC) 05/03/2022   Flu-like symptoms 06/17/2018   Cough 06/17/2018   Lower respiratory infection 06/17/2018   Chest pain, rule out acute myocardial infarction 12/20/2017   CAD (coronary artery disease) 12/20/2017   Lung nodule, multiple 07/04/2017   Post PTCA 07/04/2017   Abnormal  EKG    Acute chest pain    Femoral artery hematoma complicating cardiac catheterization 07/02/2017   Hyperlipidemia with target LDL less than 70 07/02/2017   Tobacco abuse 07/02/2017   NSTEMI (non-ST elevated myocardial infarction) (HCC)    Knee LCL sprain 03/20/2017   Rectal bleeding 10/18/2016   Special screening for malignant neoplasms, colon 10/18/2016   Vitamin D  deficiency 04/16/2015   Home Medication(s) Prior to Admission medications   Medication Sig Start Date End Date Taking? Authorizing Provider  Hyoscyamine  Sulfate SL (LEVSIN /SL) 0.125 MG SUBL Place 1 tablet (0.125 mg total) under the tongue 3 (three) times daily as needed for up to 10 days. 01/23/24 02/02/24 Yes Samer Dutton, Raynell Moder, MD  acetaminophen  (TYLENOL ) 500 MG tablet Take 1,000 mg by mouth every 6 (six) hours as needed for moderate pain (pain score 4-6).    [provider]  albuterol  (VENTOLIN  HFA) 108 (90 Base) MCG/ACT inhaler Inhale 2 puffs into the lungs 4 (four) times daily as needed. 08/09/23   [provider]  aspirin  EC 81 MG tablet Take 1 tablet (81 mg total) by mouth daily. Swallow whole. 12/04/19   Court Dorn PARAS, MD  atorvastatin  (LIPITOR ) 40 MG tablet Take 1 tablet (40 mg total) by mouth daily. 04/02/23   Gretta Lonni PARAS, MD  clopidogrel  (PLAVIX ) 75 MG tablet TAKE 1 TABLET(75 MG) BY MOUTH DAILY WITH BREAKFAST 09/02/23   Gretta Lonni PARAS, MD  cyclobenzaprine  (FLEXERIL ) 5 MG tablet Take 1 tablet (5 mg total) by mouth 3 (three) times daily as needed for  muscle spasms. 01/25/23   Gretta Lonni PARAS, MD  doxycycline (VIBRA-TABS) 100 MG tablet Take 100 mg by mouth 2 (two) times daily. 08/09/23   [provider]  gabapentin  (NEURONTIN ) 300 MG capsule Take 2 capsules (600 mg) in the morning and take 3 capsules (900 mg) at bedtime 06/21/23   Leigh Venetia CROME, MD  metoprolol  succinate (TOPROL -XL) 25 MG 24 hr tablet Take 25 mg by mouth daily. 12/01/22   [provider]  ondansetron   (ZOFRAN ) 4 MG tablet Take 1 tablet (4 mg total) by mouth every 8 (eight) hours as needed for nausea or vomiting. Patient not taking: Reported on 08/16/2023 01/25/23   Gretta Lonni PARAS, MD  oxyCODONE  (OXY IR/ROXICODONE ) 5 MG immediate release tablet Take 1-2 tablets (5-10 mg total) by mouth every 6 (six) hours as needed for moderate pain or severe pain. Patient not taking: Reported on 06/21/2023 02/19/23   Gretta Lonni PARAS, MD  pantoprazole  (PROTONIX ) 40 MG tablet Take 1 tablet (40 mg total) by mouth daily. 07/15/23   Goodrich, Callie E, PA-C  thiamine (VITAMIN B-1) 100 MG tablet Take 100 mg by mouth daily.    [provider]                                                                                                                                    Allergies Penicillins and Hydrocodone   Review of Systems Review of Systems As noted in HPI  Physical Exam Vital Signs  I have reviewed the triage vital signs BP (!) 105/52   Pulse 94   Temp 98.8 F (37.1 C) (Oral)   Resp (!) 32   Ht 5' 5 (1.651 m)   Wt 89.8 kg   SpO2 98%   BMI 32.95 kg/m   Physical Exam Vitals reviewed.  Constitutional:      General: She is not in acute distress.    Appearance: She is well-developed. She is not diaphoretic.  HENT:     Head: Normocephalic and atraumatic.     Nose: Nose normal.  Eyes:     General: No scleral icterus.       Right eye: No discharge.        Left eye: No discharge.     Conjunctiva/sclera: Conjunctivae normal.     Pupils: Pupils are equal, round, and reactive to light.  Cardiovascular:     Rate and Rhythm: Normal rate and regular rhythm.     Heart sounds: No murmur heard.    No friction rub. No gallop.  Pulmonary:     Effort: Pulmonary effort is normal. No respiratory distress.     Breath sounds: Normal breath sounds. No stridor. No rales.  Abdominal:     General: There is no distension.     Palpations: Abdomen is soft.     Tenderness: There is abdominal  tenderness in the right upper quadrant, epigastric area and periumbilical area.  Musculoskeletal:  General: No tenderness.     Cervical back: Normal range of motion and neck supple.  Skin:    General: Skin is warm and dry.     Findings: No erythema or rash.  Neurological:     Mental Status: She is alert and oriented to person, place, and time.     ED Results and Treatments Labs (all labs ordered are listed, but only abnormal results are displayed) Labs Reviewed  COMPREHENSIVE METABOLIC PANEL WITH GFR - Abnormal; Notable for the following components:      Result Value   CO2 20 (*)    Glucose, Bld 181 (*)    Alkaline Phosphatase 190 (*)    All other components within normal limits  URINALYSIS, ROUTINE W REFLEX MICROSCOPIC - Abnormal; Notable for the following components:   Specific Gravity, Urine 1.033 (*)    Glucose, UA 100 (*)    Leukocytes,Ua TRACE (*)    Bacteria, UA RARE (*)    All other components within normal limits  LIPASE, BLOOD  CBC WITH DIFFERENTIAL/PLATELET  LACTIC ACID, PLASMA                                                                                                                         EKG  EKG Interpretation Date/Time:  Thursday January 23 2024 02:11:00 EDT Ventricular Rate:  102 PR Interval:  157 QRS Duration:  74 QT Interval:  320 QTC Calculation: 417 R Axis:   45  Text Interpretation: Sinus tachycardia Low voltage, precordial leads Abnormal T, consider ischemia, diffuse leads No significant change was found Confirmed by Trine Likes [id number]) on 01/23/2024 2:50:13 AM       Radiology CT Angio Abd/Pel W and/or Wo Contrast Result Date: 01/23/2024 CLINICAL DATA:  58 year old female status post superior mesenteric artery stenting and subsequent in-stent thrombosis. Chronic abdominal pain around the incision. Shortness of breath. EXAM: CTA ABDOMEN AND PELVIS WITHOUT AND WITH CONTRAST TECHNIQUE: Multidetector CT imaging of the abdomen and pelvis  was performed using the standard protocol during bolus administration of intravenous contrast. Multiplanar reconstructed images and MIPs were obtained and reviewed to evaluate the vascular anatomy. RADIATION DOSE REDUCTION: This exam was performed according to the departmental dose-optimization program which includes automated exposure control, adjustment of the mA and/or kV according to patient size and/or use of iterative reconstruction technique. CONTRAST:  OMNIPAQUE  IOHEXOL  350 MG/ML SOLN COMPARISON:  CTA abdomen and Pelvis 12/04/2022, and earlier. FINDINGS: VASCULAR Chronic conspicuous soft plaque along the right lateral wall of the abdominal aorta at the celiac origin, stable from last year (series 4, image 43). Atherosclerosis at the celiac origin appears stable, that vessel remains patent. Chronically thrombosed proximal superior mesenteric artery stent (series 4, image 58 and series 11, image 23). However, the SMA is reconstituted and there is now a patent left Common iliac artery 2 SMA bypass (series 4, images 74 through 113, new since 12/04/2022. Surgical clips along the vessel and bypass. No new or progressive thrombosis is  identified. Renal arteries remain patent. Inferior mesenteric artery is patent but now appears more diminutive compared to last year on series 4, image 86 (compare to series 5, image 109 previously). Mild aortoiliac bifurcation soft and calcified atherosclerosis. Normal caliber abdominal aorta. Patent bilateral iliac arteries. Patent bilateral proximal femoral arteries. Portal venous phase images also provided major portal venous structures appear to be patent. Review of the MIP images confirms the above findings. NON-VASCULAR Lower chest: Mild lung base atelectasis and scarring is stable since 2022 and benign. Normal heart size. No pericardial or pleural effusion. Hepatobiliary: Negative liver and gallbladder. Pancreas: Negative. Spleen: Negative. Adrenals/Urinary Tract: Normal  adrenal glands. Stable nonobstructed kidneys with multifocal bilateral small areas of chronic renal cortical scarring. 3 mm nephrolithiasis on the right. Diminutive ureters and bladder. Stable pelvic phleboliths. Stomach/Bowel: Postoperative changes to the ventral abdominal wall with increased chronic fat containing paraumbilical hernia (series 11, image 47 and series 8, image 103, now about 3-4 cm diameter. Adjacent midline incision. No acute inflammation of the abdominal wall identified. Nondilated large bowel. Descending colon diverticulosis, no active inflammation. Upstream transverse and right colon retained stool is mild-to-moderate. Normal gas containing appendix series 11 image 54. Decompressed terminal ileum. Nondilated small bowel. Small volume retained fluid in the stomach. Negative duodenum. No pneumoperitoneum. No free fluid. No acute mesenteric stranding. Lymphatic: No lymphadenopathy identified. Reproductive: Chronically absent uterus. Diminutive or absent ovaries. Other: No pelvis free fluid. Musculoskeletal: No acute osseous abnormality identified. IMPRESSION: 1. Interval Left Common iliac artery to SMA bypass which is patent, reconstitutes the SMA branches in the setting of chronically thrombosed proximal SMA stent. Aortic Atherosclerosis (ICD10-I70.0). No new or progressive arterial thrombosis identified. 2. Increased paraumbilical fat containing ventral abdominal hernia, superimposed on midline incision. 3-4 cm rounded hernia now with no active inflammation. 3. No acute or inflammatory process identified in the abdomen or pelvis. Electronically Signed   By: VEAR Hurst M.D.   On: 01/23/2024 04:47    Medications Ordered in ED Medications  metoCLOPramide  (REGLAN ) injection 10 mg (10 mg Intravenous Given 01/23/24 0300)  iohexol  (OMNIPAQUE ) 350 MG/ML injection 100 mL (100 mLs Intravenous Contrast Given 01/23/24 0426)  alum & mag hydroxide-simeth (MAALOX/MYLANTA) 200-200-20 MG/5ML suspension 30 mL  (30 mLs Oral Given 01/23/24 0513)  hyoscyamine  (LEVSIN  SL) SL tablet 0.25 mg (0.25 mg Sublingual Given 01/23/24 9486)   Procedures Procedures  (including critical care time) Medical Decision Making / ED Course   Medical Decision Making Amount and/or Complexity of Data Reviewed Labs: ordered. Decision-making details documented in ED Course. Radiology: ordered. ECG/medicine tests: ordered and independent interpretation performed. Decision-making details documented in ED Course.  Risk OTC drugs. Prescription drug management.    Abdominal pain differential diagnosis considered.  Workup below.  Considering exacerbation of chronic abdominal pain.  Will assess for biliary disease, pancreatitis.  Also considering functional intestinal pain/bloating.  Given patient's past medical history, will need to rule out mesenteric ischemia.  With that we will also assess for any intra-abdominal inflammatory/infectious processes or bowel obstruction.  Patient provided with IV fluids and pain medicine.  CBC without leukocytosis or anemia. Metabolic panel without significant electrolyte derangements.  Hyperglycemia without DKA.  No renal insufficiency.  No evidence of biliary obstruction or pancreatitis. UA without evidence of infection  CT scan negative for any acute vascular occlusion or mesenteric ischemia.  No intra-abdominal inflammatory/infectious process.  No bowel obstruction. Lactic acid was normal and not supportive of any mesenteric ischemic process.  Patient reported continued pain despite Dilaudid  and Reglan .  Given oral Maalox and ODT Levsin  which did provide more significant relief for patient.     Final Clinical Impression(s) / ED Diagnoses Final diagnoses:  Abdominal cramping   The patient appears reasonably screened and/or stabilized for discharge and I doubt any other medical condition or other [facility] requiring further screening, evaluation, or treatment in the ED at this time. I  have discussed the findings, Dx and Tx plan with the patient/family who expressed understanding and agree(s) with the plan. Discharge instructions discussed at length. The patient/family was given strict return precautions who verbalized understanding of the instructions. No further questions at time of discharge.  Disposition: Discharge  Condition: Good  ED Discharge Orders          Ordered    Hyoscyamine  Sulfate SL (LEVSIN /SL) 0.125 MG SUBL  3 times daily PRN        01/23/24 0551             Follow Up: Claudene Round, MD [address] [place] [address] [phone]  Call  to schedule an appointment for close follow up     This chart was dictated using voice recognition software.  Despite best efforts to proofread,  errors can occur which can change the documentation meaning.    Trine Raynell Moder, MD 01/23/24 [phone]

## 2024-02-21 ENCOUNTER — Encounter: Payer: Self-pay | Admitting: Student

## 2024-02-24 NOTE — Progress Notes (Unsigned)
 Patient name: [name] MRN: [id number] DOB: 1966-04-16 Sex: female  REASON FOR CONSULT:6 month interval follow-up prior open retrograde mesenteric bypass  HPI: [name] is a 58 y.o. female, that presents for ongoing interval 6 month follow-up after direct open retrograde revascularization for chronic mesenteric ischemia with occluded SMA stent.  On 01/04/2023 she underwent retrograde left common iliac artery to superior mesenteric artery bypass using great saphenous vein.    She initially underwent percutaneous mechanical thrombectomy of the SMA with angioplasty and stenting on 05/03/2022 for acute mesenteric ischemia with SMA thrombus.  She did require ex lap with all viable bowel but had evidence of hemoperitoneum with no bleeding source identified by general surgery. She had a slow hospital course but ultimately was discharged home on aspirin  and Plavix .     I subsequently sent her for CTA abdomen pelvis given her slow clinical progress.  Her CTA 06/23/22 showed recurrent thrombus in the SMA stent with associated thrombus in her visceral aorta as demonstrated on previous imaging.  I started her on Eliquis .  I took her back to the Cath Lab on 07/05/2022 for percutaneous thrombectomy of the SMA stent and extension of the SMA stent into the abdominal aorta. Her SMA stent occluded again as proved on CTA from 12/04/22.  Today states she still has shortness of breath and also abdominal pain with fullness.     Past Medical History:  Diagnosis Date   Anemia    Chronic mesenteric ischemia (HCC)    Fibroid    Hyperlipidemia    Hypertension    Neuromuscular disorder (HCC)    Neuropathy   NSTEMI (non-ST elevated myocardial infarction) (HCC) 06/2017   s/p DES to LCX 07/02/17   Sarcoidosis     Past Surgical History:  Procedure Laterality Date   ABDOMINAL AORTIC ANEURYSM REPAIR N/A 01/04/2023   Procedure: OPEN MESENTERIC BYPASS left common iliac to SMA, lysis of adhesions;   Surgeon: Gretta Lonni PARAS, MD;  Location: [facility] OR;  Service: Vascular;  Laterality: N/A;   ABDOMINAL AORTOGRAM N/A 07/05/2022   Procedure: ABDOMINAL AORTOGRAM;  Surgeon: Gretta Lonni PARAS, MD;  Location: [facility] INVASIVE CV LAB;  Service: Cardiovascular;  Laterality: N/A;   ABDOMINAL HYSTERECTOMY     BREAST BIOPSY Right 03/24/2018   BREAST REDUCTION SURGERY Bilateral 10/17/2020   Procedure: MAMMARY REDUCTION  (BREAST);  Surgeon: Elisabeth Craig RAMAN, MD;  Location: [facility] OR;  Service: Plastics;  Laterality: Bilateral;  2 hours   CESAREAN SECTION  1987   CORONARY/GRAFT ACUTE MI REVASCULARIZATION N/A 06/30/2017   Procedure: Coronary/Graft Acute MI Revascularization;  Surgeon: Court Dorn PARAS, MD;  Location: [facility] INVAS[facility] CV LAB;  Service: Cardiovascular;  Laterality: N/A;   FOREARM FRACTURE SURGERY Right 1977   FRACTURE SURGERY     LAPAROSCOPY N/A 05/04/2022   Procedure: LAPAROSCOPY DIAGNOSTIC;  Surgeon: Rubin Calamity, MD;  Location: [facility] OR;  Service: General;  Laterality: N/A;   LAPAROTOMY N/A 05/04/2022   Procedure: EXPLORATORY LAPAROTOMY;  Surgeon: Rubin Calamity, MD;  Location: [facility] OR;  Service: General;  Laterality: N/A;   LEFT HEART CATH AND CORONARY ANGIOGRAPHY N/A 06/30/2017   Procedure: LEFT HEART CATH AND CORONARY ANGIOGRAPHY;  Surgeon: Court Dorn PARAS, MD;  Location: [facility] LAB;  Service: Cardiovascular;  Laterality: N/A;   LEFT HEART CATH AND CORONARY ANGIOGRAPHY N/A 12/23/2017   Procedure: LEFT HEART CATH AND CORONARY ANGIOGRAPHY;  Surgeon: Court Dorn PARAS, MD;  Location: [facility];  Service: Cardiovascular;  Laterality: N/A;  PERIPHERAL VASCULAR INTERVENTION  07/05/2022   Procedure: PERIPHERAL VASCULAR INTERVENTION;  Surgeon: Gretta Lonni PARAS, MD;  Location: [facility] INVASIVE CV LAB;  Service: Cardiovascular;;  SMA   PERIPHERAL VASCULAR THROMBECTOMY N/A 07/05/2022   Procedure: PERIPHERAL VASCULAR THROMBECTOMY;  Surgeon: Gretta Lonni PARAS, MD;  Location: [facility] INVAS[facility] CV  LAB;  Service: Cardiovascular;  Laterality: N/A;  SMA   TUBAL LIGATION  1990   VEIN HARVEST  01/04/2023   Procedure: VEIN HARVEST RIGHT GREATER SAPHENOUS;  Surgeon: Gretta Lonni PARAS, MD;  Location: [facility] OR;  Service: Vascular;;    Family History  Problem Relation Age of Onset   Diabetes Mother    Stroke Mother 28   Hypertension Mother    Hyperlipidemia Father    Stroke Father 98   Hypertension Father    Breast cancer Paternal Grandmother     SOCIAL HISTORY: Social History   Socioeconomic History   Marital status: Widowed    Spouse name: Not on file   Number of children: 2   Years of education: Not on file   Highest education level: Not on file  Occupational History   Occupation: Engineer, mining     Employer: [employer]  Tobacco Use   Smoking status: Every Day    Average packs/day: 1 pack/day for 29.0 years (29.0 ttl pk-yrs)    Types: Cigarettes    Start date: 03/19/1989    Last attempt to quit: 03/19/2018    Years since quitting: 5.9   Smokeless tobacco: Never   Tobacco comments:    07/15/2023 payient smokes a pack of cigarettes every two days    11/09/2021 Patient smokes some days  started back in 2023  Vaping Use   Vaping status: Never Used  Substance and Sexual Activity   Alcohol use: Not Currently    Comment: /no   Drug use: Never   Sexual activity: Not on file  Other Topics Concern   Not on file  Social History Narrative   Are you right handed or left handed? right   Are you currently employed ?    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 befriend   Who lives with you?    What type of home do you live in: 1 story or 2 story? two       Social Drivers of Corporate investment banker Strain: Not on file  Food Insecurity: No Food Insecurity (05/04/2022)   Hunger Vital Sign    Worried About Running Out of Food in the Last Year: Never true    Ran Out of Food in the Last Year: Never true   Transportation Needs: No Transportation Needs (05/04/2022)   PRAPARE - Administrator, Civil Service (Medical): No    Lack of Transportation (Non-Medical): No  Physical Activity: Not on file  Stress: Not on file  Social Connections: Not on file  Intimate Partner Violence: Not At Risk (05/04/2022)   Humiliation, Afraid, Rape, and Kick questionnaire    Fear of Current or Ex-Partner: No    Emotionally Abused: No    Physically Abused: No    Sexually Abused: No    Allergies  Allergen Reactions   Penicillins Itching and Rash    Caused rash and blisters   Hydrocodone  Hives and Itching    Current Outpatient Medications  Medication Sig Dispense Refill   acetaminophen  (TYLENOL ) 500 MG tablet Take 1,000 mg by mouth every 6 (  six) hours as needed for moderate pain (pain score 4-6).     albuterol  (VENTOLIN  HFA) 108 (90 Base) MCG/ACT inhaler Inhale 2 puffs into the lungs 4 (four) times daily as needed.     aspirin  EC 81 MG tablet Take 1 tablet (81 mg total) by mouth daily. Swallow whole. 30 tablet    atorvastatin  (LIPITOR ) 40 MG tablet Take 1 tablet (40 mg total) by mouth daily. 30 tablet 11   clopidogrel  (PLAVIX ) 75 MG tablet TAKE 1 TABLET(75 MG) BY MOUTH DAILY WITH BREAKFAST 30 tablet 11   cyclobenzaprine  (FLEXERIL ) 5 MG tablet Take 1 tablet (5 mg total) by mouth 3 (three) times daily as needed for muscle spasms. 40 tablet 0   doxycycline (VIBRA-TABS) 100 MG tablet Take 100 mg by mouth 2 (two) times daily.     gabapentin  (NEURONTIN ) 300 MG capsule Take 2 capsules (600 mg) in the morning and take 3 capsules (900 mg) at bedtime 450 capsule 3   metoprolol  succinate (TOPROL -XL) 25 MG 24 hr tablet Take 25 mg by mouth daily.     ondansetron  (ZOFRAN ) 4 MG tablet Take 1 tablet (4 mg total) by mouth every 8 (eight) hours as needed for nausea or vomiting. (Patient not taking: Reported on 08/16/2023) 30 tablet 0   oxyCODONE  (OXY IR/ROXICODONE ) 5 MG immediate release tablet Take 1-2 tablets (5-10  mg total) by mouth every 6 (six) hours as needed for moderate pain or severe pain. (Patient not taking: Reported on 06/21/2023) 30 tablet 0   pantoprazole  (PROTONIX ) 40 MG tablet Take 1 tablet (40 mg total) by mouth daily. 30 tablet 6   thiamine (VITAMIN B-1) 100 MG tablet Take 100 mg by mouth daily.     No current facility-administered medications for this visit.    REVIEW OF SYSTEMS:  [X]  denotes positive finding, [ ]  denotes negative finding Cardiac  Comments:  Chest pain or chest pressure:    Shortness of breath upon exertion:    Short of breath when lying flat:    Irregular heart rhythm:        Vascular    Pain in calf, thigh, or hip brought on by ambulation:    Pain in feet at night that wakes you up from your sleep:     Blood clot in your veins:    Leg swelling:         Pulmonary    Oxygen at home:    Productive cough:     Wheezing:         Neurologic    Sudden weakness in arms or legs:     Sudden numbness in arms or legs:     Sudden onset of difficulty speaking or slurred speech:    Temporary loss of vision in one eye:     Problems with dizziness:         Gastrointestinal    Blood in stool:     Vomited blood:     Abdominal pain    Genitourinary    Burning when urinating:     Blood in urine:        Psychiatric    Major depression:         Hematologic    Bleeding problems:    Problems with blood clotting too easily:        Skin    Rashes or ulcers:        Constitutional    Fever or chills:      PHYSICAL EXAM: There were no  vitals filed for this visit.   GENERAL: The patient is a well-nourished female, in no acute distress. The vital signs are documented above. CARDIAC: There is a regular rate and rhythm.  VASCULAR: Midline incision healed  Bilateral DP palpable PULMONARY: No respiratory distress. MUSCULOSKELETAL: There are no major deformities or cyanosis. NEUROLOGIC: No focal weakness or paresthesias are detected. PSYCHIATRIC: The patient has a  normal affect.  DATA:   Duplex today shows her mesenteric bypass remains patent from the left common iliac artery to the SMA.  CTA 01/23/2024 shows patent left common iliac to SMA bypass  Assessment/Plan:   58 y.o. female, that presents for interval 6 month follow-up after direct open retrograde revascularization for chronic mesenteric ischemia with occluded SMA stent.  On 01/04/2023 she underwent retrograde left common iliac artery to superior mesenteric artery bypass using great saphenous vein.   Discussed I did review her most recent CTA from the ED on 01/23/2024 and her left common iliac to SMA bypass is widely patent.  This is also confirmed on duplex today with no obvious stenosis.  I have asked that she stay on aspirin  Plavix  statin to maintain patency of her bypass.  I did refill her Plavix .  I will see her in 1 year with repeat mesenteric duplex.  I will place a referral to [facility] surgery given the ventral hernia noted on her recent CT.  Discussed this would be an elective repair but will certainly get general surgery to provide an opinion.   Lonni DOROTHA Gaskins, MD [facility] Office: [phone]

## 2024-02-25 ENCOUNTER — Encounter: Payer: Self-pay | Admitting: Vascular Surgery

## 2024-02-25 ENCOUNTER — Ambulatory Visit (INDEPENDENT_AMBULATORY_CARE_PROVIDER_SITE_OTHER): Admitting: Vascular Surgery

## 2024-02-25 ENCOUNTER — Ambulatory Visit (HOSPITAL_COMMUNITY)
Admission: RE | Admit: 2024-02-25 | Discharge: 2024-02-25 | Disposition: A | Discharge status: Home / Self Care | Source: Ambulatory Visit | Attending: Vascular Surgery | Admitting: Vascular Surgery

## 2024-02-25 VITALS — BP 157/84 | HR 72 | Temp 97.7°F | Resp 18 | Ht 65.0 in | Wt 202.2 lb

## 2024-02-25 DIAGNOSIS — K551 Chronic vascular disorders of intestine: Secondary | ICD-10-CM | POA: Diagnosis present

## 2024-02-25 DIAGNOSIS — K559 Vascular disorder of intestine, unspecified: Secondary | ICD-10-CM | POA: Diagnosis not present

## 2024-02-25 MED ORDER — CLOPIDOGREL BISULFATE 75 MG PO TABS: 75.0000 mg | ORAL_TABLET | Freq: Every day | ORAL | 11 refills | Status: AC

## 2024-03-16 ENCOUNTER — Other Ambulatory Visit: Payer: Self-pay | Admitting: Student

## 2024-04-10 NOTE — Progress Notes (Signed)
 I saw [name] in neurology clinic on 04/17/24 in follow up for neuropathy.  HPI: [name] is a 58 y.o. year old female with a history of pre-DM, sarcoidosis, HLD, CAD c/b NSTEMI s/p stent, PVD c/b recent SMA thrombus s/p angioplasty and stent (04/2022), former smoker who we last saw on 06/21/23.  To briefly review: 06/15/22: Patient has numbness and tingling in bilateral feet to her knees. She thinks the right foot may be worse than the left. The inside of her feet will feel cold or hot. This has been present for at least 6 months. She mentions she can no longer walk in heels due to pain. She denies weakness in legs, imbalance, or falls. She sometimes has numbness/pain in the hands as well. Patient was seen at [facility] by Dr. Silva. She was started on gabapentin  300 mg qhs. It helps her sleep and the pain at night. She finds that the next morning the gabapentin  can leave her feeling tired and off balance. She has been on this for a few weeks.   She endorses cramps in her legs, particularly at night, that can wake her up.   She denies any constitutional symptoms like fever, night sweats, anorexia or unintentional weight loss.   EtOH use: Very rare Restrictive diet? No Family history of neuropathy/myopathy/neurologic disease? No   12/14/22: Patient drives a city bus. She sits all day and has noticed a lot of swelling in her legs. She has shooting pain and burning in her feet. She has great difficulty with pain, especially at night.    IFE showed no M protein. B1 was < 6. I recommended supplementation with B1 100 mg daily. Patient has been taking. HbA1c was not collected.   EMG on 09/03/22 was consistent with a mild axonal length dependent polyneuropathy. No abnormalities of the right upper limb were found.   Her gabapentin  was increased to 300 mg BID on 09/03/22 as well. This has helped some, but not enough.   Patient presented to the ED on 09/05/22 for left  arm pain, numbness, and facial paresthesia (?for 4 days) feeling like she slept on her arm wrong. MRI brain was normal. MRI cervical spine showed moderate to severe left C5-6 foraminal stenosis and moderate bilateral C4-5 foraminal stenosis. Patient was given prednisone  and NSGY outpatient follow up. She continues to have this pain. She has not seen NSGY. She has not done any physical therapy.   Patient again denies EtOH. She denies restrictive diet or weight loss.   Gabapentin  was increased on 12/14/22 to 300 mg/600 mg.   06/21/23: She has continued pain. Her hands and feet are swollen every morning. Her feet can feel very hot then cold. She has increased her gabapentin  to 600 mg BID. Her symptoms are frustrating her and keeping her up at night.    Abdominal pain and surgery for lysis of adhesions and SMA bypass in 12/2022. She has been out of work since then. She is asking about disability today.   HbA1c was 6.4, consistent with pre-DM. B1 was still very low. I again recommended B1 supplementation. Patient states she is taking B1 100 mg every night. She had just started at last clinic visit.   EtOH use: very rare, 1-2 times per year Restrictive diet: No  Most recent Assessment and Plan (06/21/23): [name], a 58 y.o. female with a distal symmetric polyneuropathy, mild in degree on EMG. Her B1 was very low (  thiamine deficiency). Her HbA1c was recently 6.4, falling in the pre-DM range. Both of these could contribute to neuropathy. She denies EtOH use or malnutrition/losing weight but her GI issues could contribute to malabsorption of vitamins/nutrients. While there was no radiculopathy by EMG, recent MRI cervical spine showed moderate to severe left foraminal stenosis, which could also contribute/cause symptoms in her arms.   Plan: -Blood work: B1, B6, B12, Folate, MMA, vit D -Gabapentin  600 mg in morning and 900 mg in evening -Lidocaine  cream PRN -Alpha lipoic acid 600 mg once or  twice daily  Since their last visit: Labs were significant for folate and vit D deficiency. I recommended supplementation. She is taking B1, folate, vit D. Patient is not losing weight. She is having abdominal pain. She recently had a bypass for mesenteric ischemia.  She continues to have pain in her hands and feet. Her feet can burn and the next minute be very cold. She is currently taking gabapentin  600 mg in the morning and 900 mg in the evening. She does feel like this is helping.   She denies any recent falls.   MEDICATIONS:  Outpatient Encounter Medications as of 04/17/2024  Medication Sig   acetaminophen  (TYLENOL ) 500 MG tablet Take 1,000 mg by mouth every 6 (six) hours as needed for moderate pain (pain score 4-6).   albuterol  (VENTOLIN  HFA) 108 (90 Base) MCG/ACT inhaler Inhale 2 puffs into the lungs 4 (four) times daily as needed.   aspirin  EC 81 MG tablet Take 1 tablet (81 mg total) by mouth daily. Swallow whole.   atorvastatin  (LIPITOR ) 40 MG tablet Take 1 tablet (40 mg total) by mouth daily.   clopidogrel  (PLAVIX ) 75 MG tablet Take 1 tablet (75 mg total) by mouth daily.   cyclobenzaprine  (FLEXERIL ) 5 MG tablet Take 1 tablet (5 mg total) by mouth 3 (three) times daily as needed for muscle spasms.   gabapentin  (NEURONTIN ) 600 MG tablet Take 2 tablets (1,200 mg total) by mouth 2 (two) times daily. Take 1 capsule (600 mg) in the morning and take 2 capsules (1200 mg) in the evening   metoprolol  succinate (TOPROL -XL) 25 MG 24 hr tablet Take 25 mg by mouth daily.   thiamine (VITAMIN B-1) 100 MG tablet Take 100 mg by mouth daily.   [DISCONTINUED] gabapentin  (NEURONTIN ) 300 MG capsule Take 2 capsules (600 mg) in the morning and take 3 capsules (900 mg) at bedtime   doxycycline (VIBRA-TABS) 100 MG tablet Take 100 mg by mouth 2 (two) times daily. (Patient not taking: Reported on 04/17/2024)   pantoprazole  (PROTONIX ) 40 MG tablet TAKE 1 TABLET(40 MG) BY MOUTH DAILY (Patient not taking: Reported  on 04/17/2024)   No facility-administered encounter medications on file as of 04/17/2024.    PAST MEDICAL HISTORY: Past Medical History:  Diagnosis Date   Anemia    Chronic mesenteric ischemia    Fibroid    Hyperlipidemia    Hypertension    Neuromuscular disorder (HCC)    Neuropathy   NSTEMI (non-ST elevated myocardial infarction) (HCC) 06/2017   s/p DES to LCX 07/02/17   Sarcoidosis     PAST SURGICAL HISTORY: Past Surgical History:  Procedure Laterality Date   ABDOMINAL AORTIC ANEURYSM REPAIR N/A 01/04/2023   Procedure: OPEN MESENTERIC BYPASS left common iliac to SMA, lysis of adhesions;  Surgeon: Gretta Lonni PARAS, MD;  Location: [facility] OR;  Service: Vascular;  Laterality: N/A;   ABDOMINAL AORTOGRAM N/A 07/05/2022   Procedure: ABDOMINAL AORTOGRAM;  Surgeon: Gretta Lonni PARAS, MD;  Location: [facility] INVASIVE CV LAB;  Service: Cardiovascular;  Laterality: N/A;   ABDOMINAL HYSTERECTOMY     BREAST BIOPSY Right 03/24/2018   BREAST REDUCTION SURGERY Bilateral 10/17/2020   Procedure: MAMMARY REDUCTION  (BREAST);  Surgeon: Elisabeth Craig RAMAN, MD;  Location[facility];  Service: Plastics;  Laterality: Bilateral;  2 hours   CESAREAN SECTION  1987   CORONARY/GRAFT ACUTE MI REVASCULARIZATION N/A 06/30/2017   Procedure: Coronary/Graft Acute MI Revascularization;  Surgeon: Court Dorn PARAS, MD;  Location: [facility] INVASIVE CV LAB;  Service: Cardiovascular;  Laterality: N/A;   FOREARM FRACTURE SURGERY Right 1977   FRACTURE SURGERY     LAPAROSCOPY N/A 05/04/2022   Procedure: LAPAROSCOPY DIAGNOSTIC;  Surgeon: Rubin Calamity, MD;  Location: [facility] OR;  Service: General;  Laterality: N/A;   LAPAROTOMY N/A 05/04/2022   Procedure: EXPLORATORY LAPAROTOMY;  Surgeon: Rubin Calamity, MD;  Location: [facility] OR;  Service: General;  Laterality: N/A;   LEFT HEART CATH AND CORONARY ANGIOGRAPHY N/A 06/30/2017   Procedure: LEFT HEART CATH AND CORONARY ANGIOGRAPHY;  Surgeon: Court Dorn PARAS, MD;  Location: [facility] LAB;   Service: Cardiovascular;  Laterality: N/A;   LEFT HEART CATH AND CORONARY ANGIOGRAPHY N/A 12/23/2017   Procedure: LEFT HEART CATH AND CORONARY ANGIOGRAPHY;  Surgeon: Court Dorn PARAS, MD;  Location: [facility] LAB;  Service: Cardiovascular;  Laterality: N/A;   PERIPHERAL VASCULAR INTERVENTION  07/05/2022   Procedure: PERIPHERAL VASCULAR INTERVENTION;  Surgeon: Gretta Lonni PARAS, MD;  Location: [facility];  Service: Cardiovascular;;  SMA   PERIPHERAL VASCULAR THROMBECTOMY N/A 07/05/2022   Procedure: PERIPHERAL VASCULAR THROMBECTOMY;  Surgeon: Gretta Lonni PARAS, MD;  Location: [facility] LAB;  Service: Cardiovascular;  Laterality: N/A;  SMA   TUBAL LIGATION  1990   VEIN HARVEST  01/04/2023   Procedure: VEIN HARVEST RIGHT GREATER SAPHENOUS;  Surgeon: Gretta Lonni PARAS, MD;  Location: [facility] OR;  Service: Vascular;;    ALLERGIES: Allergies  Allergen Reactions   Penicillins Itching and Rash    Caused rash and blisters   Hydrocodone  Hives and Itching    FAMILY HISTORY: Family History  Problem Relation Age of Onset   Diabetes Mother    Stroke Mother 66   Hypertension Mother    Hyperlipidemia Father    Stroke Father 49   Hypertension Father    Breast cancer Paternal Grandmother     SOCIAL HISTORY: Social History   Tobacco Use   Smoking status: Every Day    Average packs/day: 1 pack/day for 29.0 years (29.0 ttl pk-yrs)    Types: Cigarettes    Start date: 03/19/1989    Last attempt to quit: 03/19/2018    Years since quitting: 6.0   Smokeless tobacco: Never   Tobacco comments:    07/15/2023 payient smokes a pack of cigarettes every two days    11/09/2021 Patient smokes some days  started back in 2023  Vaping Use   Vaping status: Never Used  Substance Use Topics   Alcohol use: Not Currently    Comment: /no   Drug use: Never   Social History   Social History Narrative   Are you right handed or left handed? right   Are you currently employed ?    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 befriend   Who lives with you?    What type of home do you live in: 1 story or 2 story?  two    Caffeine 2 liter soda in three days    Objective:  Vital Signs:  BP (!) 145/80   Pulse 87   Ht 5' 5 (1.651 m)   Wt 209 lb (94.8 kg)   SpO2 100%   BMI 34.78 kg/m   General: General appearance: Awake and alert. No distress. Cooperative with exam.  Skin: No obvious rash or jaundice. HEENT: Atraumatic. Anicteric. Lungs: Non-labored breathing on room air  Heart: Regular Extremities: No edema. No obvious deformity.   Neurological: Mental Status: Alert. Speech fluent. No pseudobulbar affect Cranial Nerves: CNII: No RAPD. Visual fields intact. CNIII, IV, VI: PERRL. No nystagmus. EOMI. CN V: Facial sensation intact bilaterally to fine touch. CN VII: Facial muscles symmetric and strong. No ptosis at rest. CN VIII: Hears finger rub well bilaterally. CN IX: No hypophonia. CN X: Palate elevates symmetrically. CN XI: Full strength shoulder shrug bilaterally. CN XII: Tongue protrusion full and midline. No atrophy or fasciculations. No significant dysarthria Motor: Tone is normal. Strength is 5/5 in bilateral upper and lower extremities. Reflexes:  Right Left  Bicep 2+ 2+  Tricep 2+ 2+  BrRad 2+ 2+  Knee 2+ 2+  Ankle 1+ 1+   Sensation: Pinprick and Vibration: Intact in all extremities Coordination: Intact finger-to- nose-finger bilaterally. Romberg negative. Gait: Normal, narrow-based gait   Lab and Test Review: New results: 06/21/23: B1: 26 B12: 380 MMA wnl B6 wnl Vit D: 23 Folate: 4.1   07/16/23: TSH wnl BMP unremarkable CBC unremarkable  01/23/24: CMP significant for glucose 181, alk phos 190   Echocardiogram (08/01/23):  1. Left ventricular ejection fraction, by estimation, is 55 to 60%. The  left ventricle has normal function. The left ventricle has no regional  wall  motion abnormalities. There is mild concentric left ventricular  hypertrophy. Left ventricular diastolic  parameters are consistent with Grade I diastolic dysfunction (impaired  relaxation). The average left ventricular global longitudinal strain is  -20.2 %. The global longitudinal strain is normal.   2. Right ventricular systolic function is normal. The right ventricular  size is normal. Tricuspid regurgitation signal is inadequate for assessing  PA pressure.   3. The mitral valve is normal in structure. Trivial mitral valve  regurgitation. No evidence of mitral stenosis.   4. The aortic valve is tricuspid. Aortic valve regurgitation is not  visualized. Aortic valve sclerosis is present, with no evidence of aortic  valve stenosis.   5. The inferior vena cava is normal in size with greater than 50%  respiratory variability, suggesting right atrial pressure of 3 mmHg.   CTA Abd/pelvis (01/23/24): IMPRESSION: 1. Interval Left Common iliac artery to SMA bypass which is patent, reconstitutes the SMA branches in the setting of chronically thrombosed proximal SMA stent. Aortic Atherosclerosis (ICD10-I70.0). No new or progressive arterial thrombosis identified.   2. Increased paraumbilical fat containing ventral abdominal hernia, superimposed on midline incision. 3-4 cm rounded hernia now with no active inflammation.   3. No acute or inflammatory process identified in the abdomen or pelvis.   Previously reviewed results: 01/05/23: CMP unremarkable CBC significant for chronic anemia (Hb 10.4), MCV 85.4   12/14/22: B1: 6 HbA1c: 6.4   06/15/22: B1: < 6 IFE: no M protein   Lipid panel (07/10/22): LDL 72   B12 (04/12/22): 1241 Folate (04/12/22): wnl ANA (04/12/22): negative ESR (04/12/22): wnl   MRI brain and cervical spine wo contrast (09/05/22): FINDINGS: MRI HEAD FINDINGS   Brain: No acute infarction, hemorrhage, hydrocephalus, extra-axial collection or  mass lesion.   Vascular:  Major arterial flow voids are maintained at the skull base.   Skull and upper cervical spine: Normal marrow signal.   Sinuses/Orbits: Clear sinuses.  No acute orbital findings.   Other: No mastoid effusions.   MRI CERVICAL SPINE FINDINGS   Alignment: No substantial sagittal subluxation.   Vertebrae: No fracture, evidence of discitis, or bone lesion.   Cord: Normal cord signal.   Posterior Fossa, vertebral arteries, paraspinal tissues: Visualized vertebral artery flow voids are maintained.   Disc levels:   C2-C3: No significant disc protrusion, foraminal stenosis, or canal stenosis.   C3-C4: Small central disc protrusion and right greater than left facet arthropathy without significant stenosis.   C4-C5: Bilateral facet and uncovertebral hypertrophy with moderate bilateral foraminal stenosis. Mild canal stenosis.   C5-C6: Posterior disc osteophyte complex with left greater than right facet and uncovertebral hypertrophy. Resulting moderate to severe left foraminal stenosis. No significant canal stenosis.   C6-C7: Left greater than right facet arthropathy. Mild left foraminal stenosis. Patent canal and right foramen.   C7-T1: No significant disc protrusion, foraminal stenosis, or canal stenosis.   IMPRESSION: MRI head:   Normal brain MRI for patient age.  No acute abnormality.   MRI cervical spine:   1. At C5-C6, moderate to severe left foraminal stenosis. 2. At C4-C5, moderate bilateral foraminal stenosis and mild canal stenosis. 3. At C6-C7, mild left foraminal stenosis.   EMG (09/03/22): NCV & EMG Findings: Extensive electrodiagnostic evaluation of the right upper and lower limbs with additional nerve conduction studies of the left lower limb shows: Bilateral sural and superficial peroneal/fibular sensory responses are absent. Right median, ulnar, and radial sensory responses are within normal limits. Bilateral tibial (AH), right peroneal/fibular (EDB), right  median (APB), and right ulnar (ADM) motor responses are within normal limits. Right H reflex latency is within normal limits. Chronic motor axon loss changes without active denervation changes are seen in the right tibialis anterior and medial head of gastrocnemius muscles. All other tested muscles are normal with normal motor unit configuration and recruitment patterns.   Impression: This is an abnormal study. The findings are most consistent with the following: Evidence of a large fiber sensorimotor polyneuropathy, axon loss in type, mild in degree electrically. No electrodiagnostic evidence of a right cervical (C5-C8) or right lumbosacral (L3-S1) motor radiculopathy. No electrodiagnostic evidence of a right median mononeuropathy at or distal to the wrist consistent with carpal tunnel syndrome.   MRI lumbar spine wo contrast (03/01/21): FINDINGS: Segmentation:  Standard.   Alignment:  Physiologic.   Vertebrae: No fracture, evidence of discitis, or suspicious bone lesion. There are a few scattered small intraosseous hemangiomas incidentally noted.   Conus medullaris and cauda equina: Conus extends to the L1 level. Conus and cauda equina appear normal.   Paraspinal and other soft tissues: Negative.   Disc levels:   T12-L1: No significant disc protrusion, foraminal stenosis, or canal stenosis.   L1-L2: No significant disc protrusion, foraminal stenosis, or canal stenosis.   L2-L3: Minimal annular disc bulge. Mild bilateral facet arthropathy. No foraminal or canal stenosis.   L3-L4: Diffuse disc bulge with left paracentral/foraminal protrusion. Slight cranial extension of disc material within the subarticular zone. Mild bilateral facet arthropathy. Findings result in mild canal stenosis with mild left subarticular recess stenosis and borderline-mild left foraminal stenosis.   L4-L5: No disc protrusion. Mild bilateral facet arthropathy. No foraminal or canal stenosis.    L5-S1: Shallow central disc protrusion. Mild bilateral facet arthropathy. No  foraminal or canal stenosis.   IMPRESSION: Mild degenerative changes of the lumbar spine, as described above. Findings are most pronounced at L3-L4 where there is mild canal stenosis, mild left subarticular recess stenosis, and borderline-mild left foraminal stenosis.  ASSESSMENT: [name], a 58 y.o. female with distal symmetric polyneuropathy, mild in degree on EMG. Her B1 was very low (thiamine deficiency) as was folate and vit D. She also has pre-DM. These are her known risk factors for neuropathy. She denies EtOH use or malnutrition/losing weight but her GI issues could contribute to malabsorption of vitamins/nutrients. While there was no radiculopathy by EMG, MRI cervical spine showed moderate to severe left foraminal stenosis, which could also contribute/cause symptoms in her arms.   Plan: -Increase gabapentin  to 1200 mg twice daily -Lidocaine  cream PRN -Alpha lipoic acid 600 mg once or twice daily -Continue B1 100 mg daily -Continue folate 1 mg daily -Continue vit D 1000 international units daily -Fall precautions discussed  Return to clinic in 6 months  Total time spent reviewing records, interview, history/exam, documentation, and coordination of care on day of encounter:  35 min  Venetia Potters, MD

## 2024-04-17 ENCOUNTER — Other Ambulatory Visit: Payer: Self-pay | Admitting: Vascular Surgery

## 2024-04-17 ENCOUNTER — Ambulatory Visit: Admitting: Neurology

## 2024-04-17 ENCOUNTER — Encounter: Payer: Self-pay | Admitting: Neurology

## 2024-04-17 VITALS — BP 145/80 | HR 87 | Ht 65.0 in | Wt 209.0 lb

## 2024-04-17 DIAGNOSIS — G629 Polyneuropathy, unspecified: Secondary | ICD-10-CM | POA: Diagnosis not present

## 2024-04-17 DIAGNOSIS — M5412 Radiculopathy, cervical region: Secondary | ICD-10-CM | POA: Diagnosis not present

## 2024-04-17 DIAGNOSIS — E519 Thiamine deficiency, unspecified: Secondary | ICD-10-CM | POA: Diagnosis not present

## 2024-04-17 DIAGNOSIS — R7303 Prediabetes: Secondary | ICD-10-CM

## 2024-04-17 DIAGNOSIS — E538 Deficiency of other specified B group vitamins: Secondary | ICD-10-CM

## 2024-04-17 DIAGNOSIS — M545 Low back pain, unspecified: Secondary | ICD-10-CM | POA: Diagnosis not present

## 2024-04-17 DIAGNOSIS — G8929 Other chronic pain: Secondary | ICD-10-CM

## 2024-04-17 MED ORDER — GABAPENTIN 600 MG PO TABS: 1200.0000 mg | ORAL_TABLET | Freq: Two times a day (BID) | ORAL | 11 refills | Status: AC

## 2024-04-17 NOTE — Patient Instructions (Addendum)
 We will increase your gabapentin  to 1200 mg twice daily (2 tablets twice a day).  Continue B1 100 mg every day.  Folate 1 mg every day  Vitamin D  1000 international unit daily  You can also try Lidocaine  cream as needed. Apply wear you have pain, tingling, or burning. Wear gloves to prevent your hands being numb. This can be bought over the counter at any drug store or online.  Alpha lipoic acid 600mg  daily has some research data suggesting it helps with nerve health. No major side effects other than <1% of people report upset stomach. This can be taken twice per day (1200mg  daily) if no relief obtained. You can buy this over the counter or online.  The physicians and staff at [facility] are committed to providing excellent care. You may receive a survey requesting feedback about your experience at our office. We strive to receive very good responses to the survey questions. If you feel that your experience would prevent you from giving the office a very good  response, please contact our office to try to remedy the situation. We may be reached at [phone]. Thank you for taking the time out of your busy day to complete the survey.  Venetia Potters, MD [facility]  Preventing Falls at [facility] are common, often dreaded events in the lives of older people. Aside from the obvious injuries and even death that may result, fall can cause wide-ranging consequences including loss of independence, mental decline, decreased activity and mobility. Younger people are also at risk of falling, especially those with chronic illnesses and fatigue.  Ways to reduce risk for falling Examine diet and medications. Warm foods and alcohol dilate blood vessels, which can lead to dizziness when standing. Sleep aids, antidepressants and pain medications can also increase the likelihood of a fall.  Get a vision exam. Poor vision, cataracts and glaucoma increase the chances of falling.  Check foot  gear. Shoes should fit snugly and have a sturdy, nonskid sole and a broad, low heel  Participate in a physician-approved exercise program to build and maintain muscle strength and improve balance and coordination. Programs that use ankle weights or stretch bands are excellent for muscle-strengthening. Water aerobics programs and low-impact Tai Chi programs have also been shown to improve balance and coordination.  Increase vitamin D  intake. Vitamin D  improves muscle strength and increases the amount of calcium  the body is able to absorb and deposit in bones.  How to prevent falls from common hazards Floors - Remove all loose wires, cords, and throw rugs. Minimize clutter. Make sure rugs are anchored and smooth. Keep furniture in its usual place.  Chairs -- Use chairs with straight backs, armrests and firm seats. Add firm cushions to existing pieces to add height.  Bathroom - Install grab bars and non-skid tape in the tub or shower. Use a bathtub transfer bench or a shower chair with a back support Use an elevated toilet seat and/or safety rails to assist standing from a low surface. Do not use towel racks or bathroom tissue holders to help you stand.  Lighting - Make sure halls, stairways, and entrances are well-lit. Install a night light in your bathroom or hallway. Make sure there is a light switch at the top and bottom of the staircase. Turn lights on if you get up in the middle of the night. Make sure lamps or light switches are within reach of the bed if you have to get up during the night.  Kitchen - Install non-skid rubber mats near the sink and stove. Clean spills immediately. Store frequently used utensils, pots, pans between waist and eye level. This helps prevent reaching and bending. Sit when getting things out of lower cupboards.  Living room/ Bedrooms - Place furniture with wide spaces in between, giving enough room to move around. Establish a route through the living room that gives  you something to hold onto as you walk.  Stairs - Make sure treads, rails, and rugs are secure. Install a rail on both sides of the stairs. If stairs are a threat, it might be helpful to arrange most of your activities on the lower level to reduce the number of times you must climb the stairs.  Entrances and doorways - Install metal handles on the walls adjacent to the doorknobs of all doors to make it more secure as you travel through the doorway.  Tips for maintaining balance Keep at least one hand free at all times. Try using a backpack or fanny pack to hold things rather than carrying them in your hands. Never carry objects in both hands when walking as this interferes with keeping your balance.  Attempt to swing both arms from front to back while walking. This might require a conscious effort if Parkinson's disease has diminished your movement. It will, however, help you to maintain balance and posture, and reduce fatigue.  Consciously lift your feet off of the ground when walking. Shuffling and dragging of the feet is a common culprit in losing your balance.  When trying to navigate turns, use a U technique of facing forward and making a wide turn, rather than pivoting sharply.  Try to stand with your feet shoulder-length apart. When your feet are close together for any length of time, you increase your risk of losing your balance and falling.  Do one thing at a time. Don't try to walk and accomplish another task, such as reading or looking around. The decrease in your automatic reflexes complicates motor function, so the less distraction, the better.  Do not wear rubber or gripping soled shoes, they might catch on the floor and cause tripping.  Move slowly when changing positions. Use deliberate, concentrated movements and, if needed, use a grab bar or walking aid. Count 15 seconds between each movement. For example, when rising from a seated position, wait 15 seconds after standing  to begin walking.  If balance is a continuous problem, you might want to consider a walking aid such as a cane, walking stick, or walker. Once you've mastered walking with help, you might be ready to try it on your own again.

## 2024-09-10 ENCOUNTER — Ambulatory Visit: Admitting: Dermatology
